# Patient Record
Sex: Female | Born: 1965 | Race: White | Hispanic: No | Smoking: Never smoker
Health system: Southern US, Community
[De-identification: ages and names within clinical notes are randomized; demographics above are authoritative.]

## PROBLEM LIST (undated history)

## (undated) DIAGNOSIS — I10 Essential (primary) hypertension: Secondary | ICD-10-CM

## (undated) DIAGNOSIS — E079 Disorder of thyroid, unspecified: Secondary | ICD-10-CM

## (undated) DIAGNOSIS — C801 Malignant (primary) neoplasm, unspecified: Secondary | ICD-10-CM

## (undated) DIAGNOSIS — J189 Pneumonia, unspecified organism: Secondary | ICD-10-CM

---

## 2018-03-01 DIAGNOSIS — R69 Illness, unspecified: Secondary | ICD-10-CM

## 2018-03-01 DIAGNOSIS — R748 Abnormal levels of other serum enzymes: Secondary | ICD-10-CM | POA: Diagnosis not present

## 2018-03-01 DIAGNOSIS — R591 Generalized enlarged lymph nodes: Secondary | ICD-10-CM | POA: Diagnosis not present

## 2018-03-02 DIAGNOSIS — R591 Generalized enlarged lymph nodes: Secondary | ICD-10-CM | POA: Diagnosis not present

## 2018-03-02 DIAGNOSIS — R748 Abnormal levels of other serum enzymes: Secondary | ICD-10-CM | POA: Diagnosis not present

## 2018-03-02 DIAGNOSIS — R509 Fever, unspecified: Secondary | ICD-10-CM

## 2018-03-02 DIAGNOSIS — R69 Illness, unspecified: Secondary | ICD-10-CM | POA: Diagnosis not present

## 2018-03-03 DIAGNOSIS — R591 Generalized enlarged lymph nodes: Secondary | ICD-10-CM | POA: Diagnosis not present

## 2018-03-03 DIAGNOSIS — J189 Pneumonia, unspecified organism: Secondary | ICD-10-CM | POA: Diagnosis not present

## 2018-03-08 ENCOUNTER — Encounter: Payer: Self-pay | Admitting: Family

## 2018-03-08 DIAGNOSIS — R590 Localized enlarged lymph nodes: Secondary | ICD-10-CM

## 2018-03-08 DIAGNOSIS — E049 Nontoxic goiter, unspecified: Secondary | ICD-10-CM | POA: Diagnosis not present

## 2018-03-08 DIAGNOSIS — Z8041 Family history of malignant neoplasm of ovary: Secondary | ICD-10-CM | POA: Diagnosis not present

## 2018-03-08 DIAGNOSIS — R945 Abnormal results of liver function studies: Secondary | ICD-10-CM | POA: Diagnosis not present

## 2018-04-15 ENCOUNTER — Other Ambulatory Visit (HOSPITAL_COMMUNITY)
Admission: RE | Admit: 2018-04-15 | Discharge: 2018-04-15 | Disposition: A | Discharge status: Home / Self Care | Payer: 59 | Source: Ambulatory Visit | Attending: Oncology | Admitting: Oncology

## 2018-04-15 DIAGNOSIS — R590 Localized enlarged lymph nodes: Secondary | ICD-10-CM | POA: Diagnosis present

## 2018-04-15 DIAGNOSIS — E039 Hypothyroidism, unspecified: Secondary | ICD-10-CM | POA: Insufficient documentation

## 2018-04-25 ENCOUNTER — Encounter: Payer: Self-pay | Admitting: Family

## 2018-04-25 DIAGNOSIS — R6889 Other general symptoms and signs: Secondary | ICD-10-CM

## 2018-04-25 DIAGNOSIS — D649 Anemia, unspecified: Secondary | ICD-10-CM

## 2018-04-25 DIAGNOSIS — E049 Nontoxic goiter, unspecified: Secondary | ICD-10-CM

## 2018-04-25 DIAGNOSIS — R945 Abnormal results of liver function studies: Secondary | ICD-10-CM | POA: Diagnosis not present

## 2018-04-25 DIAGNOSIS — R161 Splenomegaly, not elsewhere classified: Secondary | ICD-10-CM | POA: Diagnosis not present

## 2018-04-25 DIAGNOSIS — R590 Localized enlarged lymph nodes: Secondary | ICD-10-CM | POA: Diagnosis not present

## 2018-05-14 ENCOUNTER — Ambulatory Visit (INDEPENDENT_AMBULATORY_CARE_PROVIDER_SITE_OTHER): Payer: 59 | Admitting: Family

## 2018-05-14 ENCOUNTER — Encounter: Payer: Self-pay | Admitting: Family

## 2018-05-14 VITALS — BP 135/84 | HR 65 | Temp 97.9°F | Ht 68.0 in | Wt 175.0 lb

## 2018-05-14 DIAGNOSIS — R059 Cough, unspecified: Secondary | ICD-10-CM

## 2018-05-14 DIAGNOSIS — R1115 Cyclical vomiting syndrome unrelated to migraine: Secondary | ICD-10-CM

## 2018-05-14 DIAGNOSIS — R05 Cough: Secondary | ICD-10-CM | POA: Diagnosis not present

## 2018-05-14 DIAGNOSIS — G43A0 Cyclical vomiting, not intractable: Secondary | ICD-10-CM

## 2018-05-14 DIAGNOSIS — R591 Generalized enlarged lymph nodes: Secondary | ICD-10-CM

## 2018-05-14 NOTE — Patient Instructions (Signed)
Nice to meet you.  We will check your blood work today.  Please keep a temperature log of your fevers.    We will plan to follow up in 1 month or sooner if needed pending your blood work results.   Sarcoidosis Sarcoidosis is a disease that causes inflammation in your organs and other areas of your body. The lungs are most often affected (pulmonary sarcoidosis). Sarcoidosis can also affect your lymph nodes, liver, eyes, skin, or any other body tissue. When you have sarcoidosis, small clumps of tissue (granulomas) form in the affected area of your body. Granulomas are made up of your body's defense (immune) cells. Inflammation results when your body reacts to a harmful substance. Normally, inflammation goes away after immune cells get rid of the harmful substance. In sarcoidosis, the immune cells form granulomas instead. What are the causes? The exact cause of sarcoidosis is not known. Something triggers the immune system to respond, such as dust, chemicals, bacteria, or a virus. What increases the risk? You may be at a greater risk for sarcoidosis if you:  Have a family history of the disease.  Are African American.  Are of Northern European ancestry.  Are 32-41 years old.  Are female.  What are the signs or symptoms? Many people with sarcoidosis have no symptoms. Others have very mild symptoms. Sarcoidosis most often affects the lungs. Symptoms include:  Chest pain.  Coughing.  Wheezing.  Shortness of breath.  Other common symptoms include:  Night sweats.  Weight loss.  Fatigue.  Depression.  A sense of uneasiness.  How is this diagnosed? Sarcoidosis may be diagnosed by:  Medical history and physical exam.  Chest X-ray. This looks for granulomas in your lungs.  Lung function tests. These measure your breathing and look for problems related to sarcoidosis.  Examining a sample of tissue under a microscope (biopsy).  How is this treated? Sarcoidosis usually  clears up without treatment. You may take medicines to reduce inflammation or relieve symptoms. These may include:  Prednisone. This steroid reduces inflammation related to sarcoidosis.  Chloroquine or hydroxychloroquine. These are antimalarial medicines used to treat sarcoidosis that affects the skin or brain.  Methotrexate, leflunomide, or azathioprine. These medicines affect the immune system and can help with sarcoidosis in the joints, eyes, skin, or lungs.  Inhalers. Inhaled medicines can help you breathe if sarcoidosis is affecting your lungs.  Follow these instructions at home:  Do not use any tobacco products, including cigarettes, chewing tobacco, or electronic cigarettes. If you need help quitting, ask your health care provider.  Avoid secondhand smoke.  Avoid irritating dust and chemicals. Stay indoors on days when air quality is poor in your area.  Take medicines only as directed by your health care provider. Contact a health care provider if:  You have vision problems.  You have shortness of breath.  You have a dry, persistent cough.  You have an irregular heartbeat.  You have pain or ache in your joints, hands, or feet.  You have an unexplained rash. Get help right away if: You have chest pain. This information is not intended to replace advice given to you by your health care provider. Make sure you discuss any questions you have with your health care provider. Document Released: 07/12/2004 Document Revised: 02/17/2016 Document Reviewed: 01/07/2014 Elsevier Interactive Patient Education  [employer].

## 2018-05-14 NOTE — Assessment & Plan Note (Signed)
Lymphadenopathy of unclear source although appears to be improving slowly. She does continue to have elevated inflammatory markers. I do not see any true evidence of bacterial, viral, or fungal infection at present. I do question possibility of atypical infection, although the ways her symptoms present make it unlikely. She has had blood cultures performed which were negative. There is a possibility of aspiration. The other non-infectious differentials would include inflammatory condition such as sarcoidosis. Oncology continues to look at possibility of low grade lymphoma as well. I will check her Quantiferon Gold, CBC w diff, ESR, CRP, and Hepatic function panel. Recommend no antibiotics presently. Have encouraged her to track her temperatures if she has fevers. She does have follow up with oncology planned. I will see her back in about 1 month or sooner if needed pending blood work. Advised to seek further care if her symptoms worsen.

## 2018-05-14 NOTE — Progress Notes (Signed)
Subjective:    Patient ID: Cindy Ward, female    DOB: August 01, 1966, 52 y.o.   MRN: [id number]  Chief Complaint  Patient presents with  . 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 female who presents today for initial office visit for lymphadenopathy, nausea, and fevers.   Cindy Ward was initially evaluated at the [facility] for on-going lymphadenopathy of multiple sites. Symptoms started in in early June with flulike symptoms, cough, and dyspnea with sweats and fever over 103 with chills. She was diagnosed with pneumonia and thyromegaly. CT scan showed multiple left supraclavicular nodes up to 8 mm and prominent left hilar and AP window nodes but also less than 1 cm. She was given antibiotics and discharged on azithromycin. Testing for HIV, influenza, RSV, mononucleosis, and hepatitis were negative. When evaluated in oncology/hematology. There is no evidence of hemolytic anemia with LDH and qualitative immunoglobulins within normal ranges. Ultrasound of the thyroid revealed enlarged thyroid gland but only 1 cm nodule not requiring biopsy. She was once again seen in the emergency room in July and CT scan was performed showing persistent bilateral axillary and left supraclavicular adenopathy and left para pretracheal node up to 11 mm. At her follow-up her symptoms waxed and waned and appeared to have cycles every 2 weeks of cough, sweats, and chills lasting for 3-5 days and then would resolve. Her AST and ALT remained elevated at 60 and 71 respectively. LDH was 495 ALP 133 and a titer was negative; Lyme disease, parvovirus, and Rickesetta. A lymph node biopsy showed no monoclonal B cell or Phentypically aberrent T. Cell   Cindy Ward continues to experience almost cyclical episodes of cough for 1 day followed by cough, nausea, and vomiting with episodes lasting for 3-5 days at a  time and generally about 2 weeks apart. She does have fevers and rigors at times, however does not measure her temperature at home. The last of these episodes occurred about 1 week ago lasting the typical 5 days. She got better with no antibiotics, but was seen in the ED an received fluids at the time. Denies reflux, abdominal pain, diarrhea, or constipation. She has not traveled outside of the country recently and has been healthy to this point. She does not have any symptoms currently.    Continues to have the associated symptoms of nausea, vomiting, fevers, and chills with decreased appetite. Does not measure her fevers at home. Symptoms always start with a cough for a day and then come back. The last time she had symptoms was about 1 week ago and started to get better without medication. The biggest complication is nasusea and decreased ability to eat. She is able to eat outside of the   Not on File    Outpatient Medications Prior to Visit  Medication Sig Dispense Refill  . levothyroxine (SYNTHROID, LEVOTHROID) 75 MCG tablet Take 1 tablet by mouth daily.    . nebivolol (BYSTOLIC) 10 MG tablet Take 1 tablet by mouth daily.    . ondansetron (ZOFRAN-ODT) 8 MG disintegrating tablet Take 1 tablet by mouth as needed for nausea/vomiting. PRN TID  0  . PROAIR HFA 108 (90 Base) MCG/ACT inhaler Take 2 puffs by mouth 4 (four) times daily as needed.  0  . traZODone (DESYREL) 50 MG tablet Take 50 mg by mouth at bedtime.  3   No facility-administered medications prior  to visit.      History reviewed. No pertinent past medical history.    History reviewed. No pertinent surgical history.    History reviewed. No pertinent family history.    Social History   Socioeconomic History  . Marital status: Divorced    Spouse name: Not on file  . Number of children: Not on file  . Years of education: Not on file  . Highest education level: Not on file  Occupational History  . Not on file  Social  Needs  . Financial resource strain: Not on file  . Food insecurity:    Worry: Not on file    Inability: Not on file  . Transportation needs:    Medical: Not on file    Non-medical: Not on file  Tobacco Use  . Smoking status: Never Smoker  . Smokeless tobacco: Never Used  Substance and Sexual Activity  . Alcohol use: Never    Frequency: Never  . Drug use: Never  . Sexual activity: Not on file  Lifestyle  . Physical activity:    Days per week: Not on file    Minutes per session: Not on file  . Stress: Not on file  Relationships  . Social connections:    Talks on phone: Not on file    Gets together: Not on file    Attends religious service: Not on file    Active member of club or organization: Not on file    Attends meetings of clubs or organizations: Not on file    Relationship status: Not on file  . Intimate partner violence:    Fear of current or ex partner: Not on file    Emotionally abused: Not on file    Physically abused: Not on file    Forced sexual activity: Not on file  Other Topics Concern  . Not on file  Social History Narrative  . Not on file      Review of Systems  Constitutional: Positive for chills and fever.  Respiratory: Positive for cough. Negative for chest tightness.   Cardiovascular: Negative for chest pain.  Gastrointestinal: Positive for nausea and vomiting. Negative for abdominal distention, blood in stool, constipation and diarrhea.       Objective:    BP 135/84   Pulse 65   Temp 97.9 F (36.6 C)   Ht '5\' 8"'  (1.727 m)   Wt 175 lb (79.4 kg)   LMP  (LMP Unknown)   BMI 26.61 kg/m  Nursing note and vital signs reviewed.  Physical Exam  Constitutional: She is oriented to person, place, and time. She appears well-developed and well-nourished. She is cooperative. No distress.  Cardiovascular: Normal rate, regular rhythm, normal heart sounds and intact distal pulses. Exam reveals no gallop.  No murmur heard. Pulmonary/Chest: Effort  normal and breath sounds normal. No stridor. No respiratory distress. She has no wheezes. She has no rales. She exhibits no tenderness.  Abdominal: Soft. Bowel sounds are normal. She exhibits no distension and no mass. There is no tenderness. There is no rebound and no guarding. No hernia.  Lymphadenopathy:    She has cervical adenopathy.       Right cervical: Posterior cervical adenopathy present.       Right: Supraclavicular adenopathy present.       Left: Supraclavicular adenopathy present.  Neurological: She is alert and oriented to person, place, and time.  Skin: Skin is warm and dry.  Psychiatric: She has a normal mood and affect. Her behavior  is normal. Judgment and thought content normal.        Assessment & Plan:   Problem List Items Addressed This Visit      Immune and Lymphatic   Lymphadenopathy - Primary    Lymphadenopathy of unclear source although appears to be improving slowly. She does continue to have elevated inflammatory markers. I do not see any true evidence of bacterial, viral, or fungal infection at present. I do question possibility of atypical infection, although the ways her symptoms present make it unlikely. She has had blood cultures performed which were negative. There is a possibility of aspiration. The other non-infectious differentials would include inflammatory condition such as sarcoidosis. Oncology continues to look at possibility of low grade lymphoma as well. I will check her Quantiferon Gold, CBC w diff, ESR, CRP, and Hepatic function panel. Recommend no antibiotics presently. Have encouraged her to track her temperatures if she has fevers. She does have follow up with oncology planned. I will see her back in about 1 month or sooner if needed pending blood work. Advised to seek further care if her symptoms worsen.        Other Visit Diagnoses    Non-intractable cyclical vomiting with nausea       Relevant Orders   QuantiFERON-TB Gold Plus   CBC w/Diff    Sedimentation rate   C-reactive protein   Hepatic function panel   Cough       Relevant Orders   QuantiFERON-TB Gold Plus   CBC w/Diff   Sedimentation rate   C-reactive protein   Hepatic function panel       I am having Cindy Ward maintain her traZODone, ondansetron, nebivolol, levothyroxine, and PROAIR HFA.   Follow-up: Return in about 1 month (around 06/14/2018), or if symptoms worsen or fail to improve.    Terri Piedra, MSN, FNP-C Nurse Practitioner [facility] for Infectious Disease [place] [facility] Office phone: [phone] Pager: [place] number: [phone]

## 2018-05-17 LAB — QUANTIFERON-TB GOLD PLUS
NIL: 0.03 [IU]/mL
QuantiFERON-TB Gold Plus: NEGATIVE
TB1-NIL: 0 IU/mL
TB2-NIL: 0 [IU]/mL

## 2018-05-17 LAB — CBC WITH DIFFERENTIAL/PLATELET
BASOS PCT: 0.9 %
Basophils Absolute: 52 cells/uL (ref 0–200)
EOS PCT: 2.9 %
Eosinophils Absolute: 168 cells/uL (ref 15–500)
HCT: 31.6 % — ABNORMAL LOW (ref 35.0–45.0)
Hemoglobin: 10.3 g/dL — ABNORMAL LOW (ref 11.7–15.5)
Lymphs Abs: 1839 cells/uL (ref 850–3900)
MCH: 28 pg (ref 27.0–33.0)
MCHC: 32.6 g/dL (ref 32.0–36.0)
MCV: 85.9 fL (ref 80.0–100.0)
MONOS PCT: 6.9 %
MPV: 11.2 fL (ref 7.5–12.5)
NEUTROS PCT: 57.6 %
Neutro Abs: 3341 cells/uL (ref 1500–7800)
PLATELETS: 294 10*3/uL (ref 140–400)
RBC: 3.68 10*6/uL — AB (ref 3.80–5.10)
RDW: 15.7 % — AB (ref 11.0–15.0)
TOTAL LYMPHOCYTE: 31.7 %
WBC mixed population: 400 cells/uL (ref 200–950)
WBC: 5.8 10*3/uL (ref 3.8–10.8)

## 2018-05-17 LAB — SEDIMENTATION RATE: SED RATE: 56 mm/h — AB (ref 0–30)

## 2018-05-17 LAB — C-REACTIVE PROTEIN: CRP: 48 mg/L — ABNORMAL HIGH (ref <8.0)

## 2018-05-17 LAB — HEPATIC FUNCTION PANEL
AG RATIO: 1.2 (calc) (ref 1.0–2.5)
ALT: 27 U/L (ref 6–29)
AST: 26 U/L (ref 10–35)
Albumin: 3.8 g/dL (ref 3.6–5.1)
Alkaline phosphatase (APISO): 93 U/L (ref 33–130)
Bilirubin, Direct: 0.1 mg/dL (ref 0.0–0.2)
Globulin: 3.2 g/dL (calc) (ref 1.9–3.7)
Indirect Bilirubin: 0.2 mg/dL (calc) (ref 0.2–1.2)
TOTAL PROTEIN: 7 g/dL (ref 6.1–8.1)
Total Bilirubin: 0.3 mg/dL (ref 0.2–1.2)

## 2018-05-24 DIAGNOSIS — Z8041 Family history of malignant neoplasm of ovary: Secondary | ICD-10-CM

## 2018-05-24 DIAGNOSIS — R590 Localized enlarged lymph nodes: Secondary | ICD-10-CM | POA: Diagnosis not present

## 2018-05-24 DIAGNOSIS — D649 Anemia, unspecified: Secondary | ICD-10-CM

## 2018-05-24 DIAGNOSIS — Z800 Family history of malignant neoplasm of digestive organs: Secondary | ICD-10-CM

## 2018-05-24 DIAGNOSIS — R6889 Other general symptoms and signs: Secondary | ICD-10-CM

## 2018-05-24 DIAGNOSIS — R161 Splenomegaly, not elsewhere classified: Secondary | ICD-10-CM

## 2018-06-24 DIAGNOSIS — Z8041 Family history of malignant neoplasm of ovary: Secondary | ICD-10-CM

## 2018-06-24 DIAGNOSIS — D649 Anemia, unspecified: Secondary | ICD-10-CM

## 2018-06-24 DIAGNOSIS — R05 Cough: Secondary | ICD-10-CM

## 2018-06-24 DIAGNOSIS — R509 Fever, unspecified: Secondary | ICD-10-CM

## 2018-06-24 DIAGNOSIS — R591 Generalized enlarged lymph nodes: Secondary | ICD-10-CM | POA: Diagnosis not present

## 2018-06-24 DIAGNOSIS — R161 Splenomegaly, not elsewhere classified: Secondary | ICD-10-CM | POA: Diagnosis not present

## 2018-06-24 DIAGNOSIS — Z800 Family history of malignant neoplasm of digestive organs: Secondary | ICD-10-CM

## 2018-06-24 DIAGNOSIS — B0223 Postherpetic polyneuropathy: Secondary | ICD-10-CM

## 2018-06-27 ENCOUNTER — Encounter: Payer: Self-pay | Admitting: Family

## 2018-06-27 ENCOUNTER — Ambulatory Visit (INDEPENDENT_AMBULATORY_CARE_PROVIDER_SITE_OTHER): Payer: 59 | Admitting: Family

## 2018-06-27 VITALS — BP 113/74 | HR 80 | Temp 98.4°F | Wt 172.8 lb

## 2018-06-27 DIAGNOSIS — R1115 Cyclical vomiting syndrome unrelated to migraine: Secondary | ICD-10-CM

## 2018-06-27 DIAGNOSIS — R591 Generalized enlarged lymph nodes: Secondary | ICD-10-CM | POA: Diagnosis not present

## 2018-06-27 NOTE — Progress Notes (Signed)
Subjective:    Patient ID: Cindy Ward, female    DOB: [birth year], 52 y.o.   MRN: [id number]  Chief Complaint  Patient presents with  . Cyclical Vomiting and Nasuea     HPI:  Cindy Ward is a 52 y.o. female who presents today for a follow up office visit.  Cindy Ward was last seen in the office on 0/10/93 with the cyclical symptoms starting with a cough for 1 day and progressing to nausea, vomiting and fevers that lasts for about 3-5 days then spontaneously starts improving with symptoms occurring about every 2 weeks. Testing to this point has been negative. She has been seen by Oncology and diagnosed with low grade lymphoma and sarcoidosis. Most rcent blood work completed with elevated inflammatory markers and negative Quantiferon. She was seen in the ED on 06/14/18 for vomiting.   Towards the end of September Cindy Ward completed a course of antibiotics which did not help with her symptoms. She does continue to have a cough, but has remained asymptomatic for the past 13 days. 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 She has follow up with [name] about the possibility of performing a bone marrow biposy. Other than cough, she does not have any other symptoms.   No Known Allergies    Outpatient Medications Prior to Visit  Medication Sig Dispense Refill  . levothyroxine (SYNTHROID, LEVOTHROID) 75 MCG tablet Take 1 tablet by mouth daily.    . nebivolol (BYSTOLIC) 10 MG tablet Take 1 tablet by mouth daily.    . ondansetron (ZOFRAN-ODT) 8 MG disintegrating tablet Take 1 tablet by mouth as needed for nausea/vomiting. PRN TID  0  . PROAIR HFA 108 (90 Base) MCG/ACT inhaler Take 2 puffs by mouth 4 (four) times daily as needed.  0  . traZODone (DESYREL) 50 MG tablet Take 50 mg by mouth at bedtime.  3   No facility-administered medications prior to visit.      History reviewed. No pertinent past medical  history.   History reviewed. No pertinent surgical history.     Review of Systems  Constitutional: Negative for chills, fatigue and fever.  Respiratory: Positive for cough. Negative for chest tightness, shortness of breath and wheezing.   Cardiovascular: Negative for chest pain and leg swelling.  Gastrointestinal: Negative for abdominal pain, blood in stool, constipation, diarrhea, nausea and rectal pain.      Objective:    BP 113/74   Pulse 80   Temp 98.4 F (36.9 C)   Wt 172 lb 12.8 oz (78.4 kg)   LMP  (LMP Unknown)   BMI 26.27 kg/m  Nursing note and vital signs reviewed.  Physical Exam  Constitutional: She is oriented to person, place, and time. She appears well-developed and well-nourished. No distress.  Cardiovascular: Normal rate, regular rhythm, normal heart sounds and intact distal pulses.  Pulmonary/Chest: Effort normal and breath sounds normal. No stridor. No respiratory distress. She has no wheezes. She has no rales. She exhibits no tenderness.  Neurological: She is alert and oriented to person, place, and time.  Skin: Skin is warm and dry.  Psychiatric: She has a normal mood and affect.       Assessment & Plan:   Problem List Items Addressed This Visit      Immune and Lymphatic   Lymphadenopathy - Primary    [name] has been asymptomatic for the past 13 days which is the  longest time that she has not had any symptoms. There does appear to be inflammation present as evidenced through the inflammatory markers, although this is non-specific. There does not appear to be anything infectious at this time that would require antimicrobial therapy. She continues to work with oncology with the possibility of a bone marrow biopsy to rule out lymphoma. Sarcoid could also be playing a role in this. I am also concerned about the possibility of environmental exposure as a origin of her symptoms. I have provided her surgical masks to wear at work to see if that makes any  difference. 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 We will continue to follow.        Other Visit Diagnoses    Non-intractable cyclical vomiting with nausea           I am having Idelia Salm maintain her traZODone, ondansetron, nebivolol, levothyroxine, and PROAIR HFA.    Follow-up: Pending further oncology work up.   Terri Piedra, MSN, FNP-C Nurse Practitioner [facility] for Infectious Disease [facility] Office phone: [phone] Pager: [place] number: [phone]

## 2018-06-27 NOTE — Assessment & Plan Note (Signed)
Cindy Ward has been asymptomatic for the past 13 days which is the longest time that she has not had any symptoms. There does appear to be inflammation present as evidenced through the inflammatory markers, although this is non-specific. There does not appear to be anything infectious at this time that would require antimicrobial therapy. She continues to work with oncology with the possibility of a bone marrow biopsy to rule out lymphoma. Sarcoid could also be playing a role in this. I am also concerned about the possibility of environmental exposure as a origin of her symptoms. I have provided her surgical masks to wear at work to see if that makes any difference. 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 We will continue to follow.

## 2018-06-27 NOTE — Patient Instructions (Signed)
Nice to see you.  No additional blood work at this time.   Recommend attempting a mask while at work to see if this exposure may be the cause of your symptoms.   I do not think that this is an infectious process based on the presentation of your symptoms and that all of your testing has been with no significant findings to date.   I will reach out to [name] to discuss.  Follow up with ID as needed for now.

## 2018-07-02 ENCOUNTER — Other Ambulatory Visit (HOSPITAL_COMMUNITY)
Admission: RE | Admit: 2018-07-02 | Discharge: 2018-07-02 | Disposition: A | Discharge status: Home / Self Care | Payer: 59 | Source: Ambulatory Visit | Attending: Oncology | Admitting: Oncology

## 2018-07-02 DIAGNOSIS — R161 Splenomegaly, not elsewhere classified: Secondary | ICD-10-CM

## 2018-07-02 DIAGNOSIS — D649 Anemia, unspecified: Secondary | ICD-10-CM | POA: Insufficient documentation

## 2018-07-02 DIAGNOSIS — R509 Fever, unspecified: Secondary | ICD-10-CM | POA: Diagnosis not present

## 2018-07-08 DIAGNOSIS — D649 Anemia, unspecified: Secondary | ICD-10-CM | POA: Diagnosis not present

## 2018-07-08 DIAGNOSIS — R161 Splenomegaly, not elsewhere classified: Secondary | ICD-10-CM

## 2018-07-08 DIAGNOSIS — R05 Cough: Secondary | ICD-10-CM

## 2018-07-08 DIAGNOSIS — R509 Fever, unspecified: Secondary | ICD-10-CM

## 2018-07-08 DIAGNOSIS — R945 Abnormal results of liver function studies: Secondary | ICD-10-CM

## 2018-07-08 DIAGNOSIS — R590 Localized enlarged lymph nodes: Secondary | ICD-10-CM

## 2018-07-11 ENCOUNTER — Encounter (HOSPITAL_COMMUNITY): Payer: Self-pay | Admitting: Oncology

## 2018-08-26 DIAGNOSIS — R630 Anorexia: Secondary | ICD-10-CM

## 2018-08-26 DIAGNOSIS — R509 Fever, unspecified: Secondary | ICD-10-CM

## 2018-08-26 DIAGNOSIS — R1111 Vomiting without nausea: Secondary | ICD-10-CM

## 2018-08-26 DIAGNOSIS — R590 Localized enlarged lymph nodes: Secondary | ICD-10-CM

## 2018-08-26 DIAGNOSIS — D649 Anemia, unspecified: Secondary | ICD-10-CM

## 2018-08-26 DIAGNOSIS — R05 Cough: Secondary | ICD-10-CM

## 2018-10-24 DIAGNOSIS — E876 Hypokalemia: Secondary | ICD-10-CM

## 2018-10-24 DIAGNOSIS — E860 Dehydration: Secondary | ICD-10-CM | POA: Diagnosis not present

## 2018-10-24 DIAGNOSIS — R590 Localized enlarged lymph nodes: Secondary | ICD-10-CM

## 2018-10-24 DIAGNOSIS — R5081 Fever presenting with conditions classified elsewhere: Secondary | ICD-10-CM | POA: Diagnosis not present

## 2018-10-30 DIAGNOSIS — E876 Hypokalemia: Secondary | ICD-10-CM

## 2018-10-30 DIAGNOSIS — E860 Dehydration: Secondary | ICD-10-CM

## 2018-10-30 DIAGNOSIS — R590 Localized enlarged lymph nodes: Secondary | ICD-10-CM

## 2018-10-30 DIAGNOSIS — R5081 Fever presenting with conditions classified elsewhere: Secondary | ICD-10-CM

## 2018-11-11 ENCOUNTER — Encounter (HOSPITAL_COMMUNITY): Payer: Self-pay | Admitting: Nurse Practitioner

## 2018-11-11 ENCOUNTER — Inpatient Hospital Stay (HOSPITAL_COMMUNITY): Payer: Medicaid Other

## 2018-11-11 ENCOUNTER — Inpatient Hospital Stay (HOSPITAL_COMMUNITY)
Admission: AD | Admit: 2018-11-11 | Discharge: 2018-12-12 | DRG: 823 | Disposition: A | Discharge status: Home Health | Payer: Medicaid Other | Source: Other Acute Inpatient Hospital | Attending: Internal Medicine | Admitting: Internal Medicine

## 2018-11-11 DIAGNOSIS — E43 Unspecified severe protein-calorie malnutrition: Secondary | ICD-10-CM | POA: Diagnosis present

## 2018-11-11 DIAGNOSIS — I313 Pericardial effusion (noninflammatory): Secondary | ICD-10-CM | POA: Diagnosis present

## 2018-11-11 DIAGNOSIS — C8194 Hodgkin lymphoma, unspecified, lymph nodes of axilla and upper limb: Principal | ICD-10-CM | POA: Diagnosis present

## 2018-11-11 DIAGNOSIS — N644 Mastodynia: Secondary | ICD-10-CM

## 2018-11-11 DIAGNOSIS — Z79890 Hormone replacement therapy (postmenopausal): Secondary | ICD-10-CM

## 2018-11-11 DIAGNOSIS — R591 Generalized enlarged lymph nodes: Secondary | ICD-10-CM | POA: Diagnosis present

## 2018-11-11 DIAGNOSIS — E049 Nontoxic goiter, unspecified: Secondary | ICD-10-CM | POA: Diagnosis present

## 2018-11-11 DIAGNOSIS — Z681 Body mass index (BMI) 19 or less, adult: Secondary | ICD-10-CM | POA: Diagnosis not present

## 2018-11-11 DIAGNOSIS — D61818 Other pancytopenia: Secondary | ICD-10-CM | POA: Diagnosis present

## 2018-11-11 DIAGNOSIS — A0472 Enterocolitis due to Clostridium difficile, not specified as recurrent: Secondary | ICD-10-CM | POA: Diagnosis present

## 2018-11-11 DIAGNOSIS — Z515 Encounter for palliative care: Secondary | ICD-10-CM

## 2018-11-11 DIAGNOSIS — R6520 Severe sepsis without septic shock: Secondary | ICD-10-CM | POA: Diagnosis not present

## 2018-11-11 DIAGNOSIS — E063 Autoimmune thyroiditis: Secondary | ICD-10-CM | POA: Diagnosis present

## 2018-11-11 DIAGNOSIS — B955 Unspecified streptococcus as the cause of diseases classified elsewhere: Secondary | ICD-10-CM | POA: Diagnosis not present

## 2018-11-11 DIAGNOSIS — G9341 Metabolic encephalopathy: Secondary | ICD-10-CM | POA: Diagnosis not present

## 2018-11-11 DIAGNOSIS — J9600 Acute respiratory failure, unspecified whether with hypoxia or hypercapnia: Secondary | ICD-10-CM | POA: Diagnosis not present

## 2018-11-11 DIAGNOSIS — C8190 Hodgkin lymphoma, unspecified, unspecified site: Secondary | ICD-10-CM | POA: Diagnosis present

## 2018-11-11 DIAGNOSIS — B9620 Unspecified Escherichia coli [E. coli] as the cause of diseases classified elsewhere: Secondary | ICD-10-CM | POA: Diagnosis not present

## 2018-11-11 DIAGNOSIS — C8128 Mixed cellularity classical Hodgkin lymphoma, lymph nodes of multiple sites: Secondary | ICD-10-CM

## 2018-11-11 DIAGNOSIS — T451X5A Adverse effect of antineoplastic and immunosuppressive drugs, initial encounter: Secondary | ICD-10-CM | POA: Diagnosis not present

## 2018-11-11 DIAGNOSIS — D638 Anemia in other chronic diseases classified elsewhere: Secondary | ICD-10-CM | POA: Diagnosis not present

## 2018-11-11 DIAGNOSIS — R5081 Fever presenting with conditions classified elsewhere: Secondary | ICD-10-CM | POA: Diagnosis present

## 2018-11-11 DIAGNOSIS — Z7189 Other specified counseling: Secondary | ICD-10-CM

## 2018-11-11 DIAGNOSIS — D6959 Other secondary thrombocytopenia: Secondary | ICD-10-CM | POA: Diagnosis not present

## 2018-11-11 DIAGNOSIS — E440 Moderate protein-calorie malnutrition: Secondary | ICD-10-CM | POA: Diagnosis present

## 2018-11-11 DIAGNOSIS — I700 Atherosclerosis of aorta: Secondary | ICD-10-CM | POA: Diagnosis present

## 2018-11-11 DIAGNOSIS — R17 Unspecified jaundice: Secondary | ICD-10-CM

## 2018-11-11 DIAGNOSIS — B370 Candidal stomatitis: Secondary | ICD-10-CM | POA: Diagnosis present

## 2018-11-11 DIAGNOSIS — K1230 Oral mucositis (ulcerative), unspecified: Secondary | ICD-10-CM | POA: Diagnosis not present

## 2018-11-11 DIAGNOSIS — E872 Acidosis: Secondary | ICD-10-CM | POA: Diagnosis not present

## 2018-11-11 DIAGNOSIS — A419 Sepsis, unspecified organism: Secondary | ICD-10-CM | POA: Diagnosis not present

## 2018-11-11 DIAGNOSIS — I10 Essential (primary) hypertension: Secondary | ICD-10-CM | POA: Diagnosis present

## 2018-11-11 DIAGNOSIS — R0600 Dyspnea, unspecified: Secondary | ICD-10-CM

## 2018-11-11 DIAGNOSIS — E876 Hypokalemia: Secondary | ICD-10-CM | POA: Diagnosis not present

## 2018-11-11 DIAGNOSIS — D689 Coagulation defect, unspecified: Secondary | ICD-10-CM | POA: Diagnosis not present

## 2018-11-11 DIAGNOSIS — R748 Abnormal levels of other serum enzymes: Secondary | ICD-10-CM | POA: Diagnosis present

## 2018-11-11 DIAGNOSIS — Z79899 Other long term (current) drug therapy: Secondary | ICD-10-CM

## 2018-11-11 DIAGNOSIS — D696 Thrombocytopenia, unspecified: Secondary | ICD-10-CM | POA: Diagnosis present

## 2018-11-11 DIAGNOSIS — R161 Splenomegaly, not elsewhere classified: Secondary | ICD-10-CM | POA: Diagnosis present

## 2018-11-11 DIAGNOSIS — E860 Dehydration: Secondary | ICD-10-CM | POA: Diagnosis present

## 2018-11-11 DIAGNOSIS — D709 Neutropenia, unspecified: Secondary | ICD-10-CM | POA: Diagnosis present

## 2018-11-11 DIAGNOSIS — D630 Anemia in neoplastic disease: Secondary | ICD-10-CM | POA: Diagnosis not present

## 2018-11-11 DIAGNOSIS — N939 Abnormal uterine and vaginal bleeding, unspecified: Secondary | ICD-10-CM

## 2018-11-11 DIAGNOSIS — R7881 Bacteremia: Secondary | ICD-10-CM | POA: Diagnosis present

## 2018-11-11 DIAGNOSIS — N136 Pyonephrosis: Secondary | ICD-10-CM | POA: Diagnosis not present

## 2018-11-11 DIAGNOSIS — R634 Abnormal weight loss: Secondary | ICD-10-CM | POA: Diagnosis present

## 2018-11-11 DIAGNOSIS — R21 Rash and other nonspecific skin eruption: Secondary | ICD-10-CM | POA: Diagnosis present

## 2018-11-11 DIAGNOSIS — R441 Visual hallucinations: Secondary | ICD-10-CM | POA: Diagnosis not present

## 2018-11-11 DIAGNOSIS — I119 Hypertensive heart disease without heart failure: Secondary | ICD-10-CM | POA: Diagnosis present

## 2018-11-11 DIAGNOSIS — D649 Anemia, unspecified: Secondary | ICD-10-CM | POA: Insufficient documentation

## 2018-11-11 DIAGNOSIS — R0682 Tachypnea, not elsewhere classified: Secondary | ICD-10-CM

## 2018-11-11 DIAGNOSIS — R509 Fever, unspecified: Secondary | ICD-10-CM | POA: Diagnosis present

## 2018-11-11 HISTORY — DX: Essential (primary) hypertension: I10

## 2018-11-11 HISTORY — DX: Disorder of thyroid, unspecified: E07.9

## 2018-11-11 LAB — COMPREHENSIVE METABOLIC PANEL
ALT: 62 U/L — ABNORMAL HIGH (ref 0–44)
AST: 190 U/L — ABNORMAL HIGH (ref 15–41)
Albumin: 2.3 g/dL — ABNORMAL LOW (ref 3.5–5.0)
Alkaline Phosphatase: 96 U/L (ref 38–126)
Anion gap: 9 (ref 5–15)
BUN: 17 mg/dL (ref 6–20)
CHLORIDE: 101 mmol/L (ref 98–111)
CO2: 25 mmol/L (ref 22–32)
Calcium: 7.5 mg/dL — ABNORMAL LOW (ref 8.9–10.3)
Creatinine, Ser: 0.9 mg/dL (ref 0.44–1.00)
GFR calc Af Amer: >60 mL/min (ref >60)
GFR calc non Af Amer: >60 mL/min (ref >60)
Glucose, Bld: 113 mg/dL — ABNORMAL HIGH (ref 70–99)
Potassium: 3.3 mmol/L — ABNORMAL LOW (ref 3.5–5.1)
Sodium: 135 mmol/L (ref 135–145)
Total Bilirubin: 1.9 mg/dL — ABNORMAL HIGH (ref 0.3–1.2)
Total Protein: 6.1 g/dL — ABNORMAL LOW (ref 6.5–8.1)

## 2018-11-11 LAB — DIFFERENTIAL
Basophils Absolute: 0 10*3/uL (ref 0.0–0.1)
Basophils Relative: 0 %
Eosinophils Absolute: 0 10*3/uL (ref 0.0–0.5)
Eosinophils Relative: 0 %
LYMPHS ABS: 0.4 10*3/uL — AB (ref 0.7–4.0)
Lymphocytes Relative: 10 %
Monocytes Absolute: 0.7 10*3/uL (ref 0.1–1.0)
Monocytes Relative: 16 %
Neutro Abs: 3.2 10*3/uL (ref 1.7–7.7)
Neutrophils Relative %: 72 %

## 2018-11-11 LAB — RETICULOCYTES
IMMATURE RETIC FRACT: 10.5 % (ref 2.3–15.9)
RBC.: 2.46 MIL/uL — ABNORMAL LOW (ref 3.87–5.11)
Retic Count, Absolute: 29.5 10*3/uL (ref 19.0–186.0)
Retic Ct Pct: 1.2 % (ref 0.4–3.1)

## 2018-11-11 LAB — CBC
HCT: 20.3 % — ABNORMAL LOW (ref 36.0–46.0)
Hemoglobin: 6.5 g/dL — CL (ref 12.0–15.0)
MCH: 26.4 pg (ref 26.0–34.0)
MCHC: 32 g/dL (ref 30.0–36.0)
MCV: 82.5 fL (ref 80.0–100.0)
Platelets: 70 10*3/uL — ABNORMAL LOW (ref 150–400)
RBC: 2.46 MIL/uL — ABNORMAL LOW (ref 3.87–5.11)
RDW: 18 % — ABNORMAL HIGH (ref 11.5–15.5)
WBC: 4.4 10*3/uL (ref 4.0–10.5)
nRBC: 0 % (ref 0.0–0.2)

## 2018-11-11 LAB — PREPARE RBC (CROSSMATCH)

## 2018-11-11 LAB — PROTIME-INR
INR: 1.84
Prothrombin Time: 21 seconds — ABNORMAL HIGH (ref 11.4–15.2)

## 2018-11-11 LAB — GLUCOSE, CAPILLARY: GLUCOSE-CAPILLARY: 110 mg/dL — AB (ref 70–99)

## 2018-11-11 LAB — APTT: aPTT: 43 seconds — ABNORMAL HIGH (ref 24–36)

## 2018-11-11 LAB — LACTATE DEHYDROGENASE: LDH: 176 U/L (ref 98–192)

## 2018-11-11 LAB — DIRECT ANTIGLOBULIN TEST (NOT AT ARMC)
DAT, IGG: NEGATIVE
DAT, complement: NEGATIVE

## 2018-11-11 LAB — MRSA PCR SCREENING: MRSA by PCR: NEGATIVE

## 2018-11-11 LAB — SAVE SMEAR(SSMR), FOR PROVIDER SLIDE REVIEW

## 2018-11-11 LAB — ABO/RH: ABO/RH(D): AB NEG

## 2018-11-11 MED ORDER — NEBIVOLOL HCL 10 MG PO TABS
10.0000 mg | ORAL_TABLET | Freq: Every day | ORAL | Status: DC
  Administered 2018-11-11 – 2018-11-15 (×4): 10 mg via ORAL
  Filled 2018-11-11 (×5): qty 1

## 2018-11-11 MED ORDER — SODIUM CHLORIDE 0.9 % IV BOLUS
500.0000 mL | Freq: Once | INTRAVENOUS | Status: AC
  Administered 2018-11-11: 500 mL via INTRAVENOUS

## 2018-11-11 MED ORDER — POTASSIUM CHLORIDE 10 MEQ/100ML IV SOLN
10.0000 meq | INTRAVENOUS | Status: AC
  Administered 2018-11-11 – 2018-11-12 (×4): 10 meq via INTRAVENOUS
  Filled 2018-11-11 (×4): qty 100

## 2018-11-11 MED ORDER — SODIUM CHLORIDE 0.9% IV SOLUTION: Freq: Once | INTRAVENOUS | Status: AC

## 2018-11-11 MED ORDER — OXYCODONE HCL 5 MG PO TABS: 5.0000 mg | ORAL_TABLET | ORAL | Status: DC | PRN

## 2018-11-11 MED ORDER — ONDANSETRON HCL 4 MG PO TABS
4.0000 mg | ORAL_TABLET | Freq: Four times a day (QID) | ORAL | Status: DC | PRN
Start: 2018-11-11 — End: 2018-12-12

## 2018-11-11 MED ORDER — ORAL CARE MOUTH RINSE
15.0000 mL | Freq: Two times a day (BID) | OROMUCOSAL | Status: DC
  Administered 2018-11-12 – 2018-12-12 (×45): 15 mL via OROMUCOSAL

## 2018-11-11 MED ORDER — SODIUM CHLORIDE 0.9 % IV SOLN
2.0000 g | INTRAVENOUS | Status: DC
  Administered 2018-11-12: 2 g via INTRAVENOUS
  Filled 2018-11-11: qty 2

## 2018-11-11 MED ORDER — BOOST / RESOURCE BREEZE PO LIQD CUSTOM
1.0000 | Freq: Three times a day (TID) | ORAL | Status: DC
  Administered 2018-11-12 – 2018-11-15 (×4): 1 via ORAL
  Administered 2018-11-16: 237 mL via ORAL
  Administered 2018-11-17: 1 via ORAL
  Administered 2018-11-17: 237 mL via ORAL
  Administered 2018-11-17 – 2018-11-23 (×7): 1 via ORAL

## 2018-11-11 MED ORDER — ACETAMINOPHEN 325 MG PO TABS
650.0000 mg | ORAL_TABLET | Freq: Once | ORAL | Status: AC
  Administered 2018-11-11: 650 mg via ORAL

## 2018-11-11 MED ORDER — ACETAMINOPHEN 325 MG PO TABS
650.0000 mg | ORAL_TABLET | Freq: Four times a day (QID) | ORAL | Status: DC | PRN
  Administered 2018-11-11 – 2018-11-25 (×27): 650 mg via ORAL
  Filled 2018-11-11 (×31): qty 2

## 2018-11-11 MED ORDER — SODIUM CHLORIDE 0.9 % IV BOLUS: 1000.0000 mL | Freq: Once | INTRAVENOUS | Status: DC

## 2018-11-11 MED ORDER — SODIUM CHLORIDE 0.9% IV SOLUTION: Freq: Once | INTRAVENOUS | Status: DC

## 2018-11-11 MED ORDER — LACTATED RINGERS IV SOLN
INTRAVENOUS | Status: AC
  Administered 2018-11-11 – 2018-11-12 (×3): via INTRAVENOUS

## 2018-11-11 MED ORDER — POLYETHYLENE GLYCOL 3350 17 G PO PACK
17.0000 g | PACK | Freq: Every day | ORAL | Status: DC | PRN
Start: 2018-11-11 — End: 2018-11-25

## 2018-11-11 MED ORDER — POTASSIUM CHLORIDE CRYS ER 20 MEQ PO TBCR: 40.0000 meq | EXTENDED_RELEASE_TABLET | Freq: Once | ORAL | Status: DC

## 2018-11-11 MED ORDER — ACETAMINOPHEN 650 MG RE SUPP
650.0000 mg | Freq: Four times a day (QID) | RECTAL | Status: DC | PRN
  Administered 2018-11-18 – 2018-12-06 (×5): 650 mg via RECTAL
  Filled 2018-11-11 (×6): qty 1

## 2018-11-11 MED ORDER — ONDANSETRON HCL 4 MG/2ML IJ SOLN
4.0000 mg | Freq: Four times a day (QID) | INTRAMUSCULAR | Status: DC | PRN
  Administered 2018-11-11 – 2018-11-28 (×7): 4 mg via INTRAVENOUS
  Filled 2018-11-11 (×7): qty 2

## 2018-11-11 MED ORDER — LEVOTHYROXINE SODIUM 75 MCG PO TABS
75.0000 ug | ORAL_TABLET | Freq: Every day | ORAL | Status: DC
  Administered 2018-11-12 – 2018-11-25 (×13): 75 ug via ORAL
  Filled 2018-11-11 (×13): qty 1

## 2018-11-11 NOTE — Progress Notes (Signed)
Antony Blackbird, RN discussed patient condition with MD Florene Glen.   MD discussed blood risks and benefits with pt over the phone and obtained verbal consent for blood administration, consent witnessed by Ronalee Belts, RN and Karna Christmas, RN.  Orders received and consent signed.

## 2018-11-11 NOTE — Progress Notes (Signed)
Pt. Came to the unit with blood infusing through Lt P[facility]. Blood administraation completed at 1800. VS Post bld. Admin.  [id number](57)  Temp 102.32F

## 2018-11-11 NOTE — H&P (Signed)
History and Physical    [name]:[id number] DOB: 12/03/1965 DOA: 11/11/2018  PCP: [name], MD  Patient coming from: home  I have personally briefly reviewed patient's old medical records in [place]  Chief Complaint: fevers, night sweats, nausea, vomiting  HPI: Cindy Ward is a 53 y.o. female with medical history significant of hypertension hypothyroidism essentially outside hospital with recurrent nausea, vomiting, fevers, and night sweats.  Patient notes a 70-monthhistory of cough, fever, chills, night sweats, and nausea and vomiting.  She notes that the symptoms occur about every 2 weeks.  She has nightly fevers and night sweats.  The symptoms occur for days at a time, but have been taking longer and longer to resolve.  She denies any recent travel or sick contacts.  She denies any rash.  She denies any chest pain, shortness of breath, abdominal pain, diarrhea.  She denies any history of sexually transmitted infection.  She came to the hospital today because she passed out at the cancer center.  She describes getting dizzy and then hitting the floor.  [facility]  Her vitals were notable for a fever to 102.7.  She was satting normally on room air.  She was not tachycardic.  She was normotensive.  Her labs were notable for blood cell count of 4.1.  Worsening anemia to 7.  Worsening thrombocytopenia to 82.  Reticulocyte count of 0.9%.  INR was 1.5.  Sodium was 132.  Potassium 2.9.  Chloride 94.  Creatinine 0.9.  Lactate was 2.7, but improved to 0.8 with fluids.  Corrected calcium was 8.6.  Bilirubin was 2.1.  AST was 217 and ALT was 63.  LDH 661.  Her TSH was 0.14 and her free T4 was 1.18.  Urinalysis was notable for 2+ protein, occult blood, urobilinogen, negative nitrite and negative leukocyte esterase and few bacteria.  Had negative chest x-ray.  She had a CT chest abdomen pelvis as noted below.  In the emergency  department she got IV fluids, ceftriaxone.  The case was discussed with Dr. MHinton Raowho recommended transfer for further workup as well as LDH, haptoglobin, and retics.  MPRESSION: 1. Notably worsened adenopathy in the bilateral axillary and subpectoral regions, left infrahilar region, and upper abdomen/retroperitoneum compatible with lymphoma. There is also notable splenomegaly, splenic volume 610 cubic cm, with some mild splenic density heterogeneity. 2. New small to moderate-sized pericardial effusion. There is also a small amount of free pelvic fluid which is abnormal but nonspecific. 3. Stable thyroid goiter. 4. Trace bilateral pleural effusions. 5.  Aortic Atherosclerosis (ICD10-I70.0).  2/12 Oncology Assessment and Plan from Dr. MCasimiro Needle  ASSESSMENT AND PLAN:  This is a 53year old woman who has been having recurrent episodes of fever, cough, anorexia, nausea and vomiting for nearly 9 months now.  I have done numerous evaluations including lymph node biopsy, repeated scans, bone marrow examination and numerous labs including testing for infectious mono, parvovirus, HIV, RSV, hepatitis, influenza, Lyme disease, Rickettsia, and multiple myeloma.  Her bone marrow cultures were negative.  Her liver function tests were transiently abnormal but that has resolved and she did have splenomegaly which also resolved.  There has been no evidence of sarcoidosis or lymphoma in the node or bone marrow, and ACE was normal.  She has had numerous CT scans which have been inconclusive.  She has been to Infectious Disease as well as the CDC with no clear etiology found.  Now she has recurrent  bilateral axillary adenopathy which has increased again and is associated with fever and recurrent symptoms.  We have done full cultures and I have recommended that she see a surgeon for a removal of a node so that we can also do full cultures, lymphoma protocol, as well as routine histology.  I will give her support with IV  fluids today of 2 L normal saline as well as anti emetics and IV antibiotics.  She will likely need to return again later this week for the same.  She understands and agrees with this plan of care.  Her daughters are quite frantic, understandably so as they see her deteriorating steadily.  This is the worst I have seen her, and she has lost nearly 40 lb since June.  They requested a referral to an immunologist but I have no specific finding.  I absolutely agree with referral to a tertiary care center but would consider Infectious Disease or hematology.  At this time the patient is reluctant to go elsewhere so we will attempt to see what we find with the lymph node and the cultures.  I do not see that she is capable of working.  Review of Systems: As per HPI otherwise 10 point review of systems negative.   No past medical history on file. Hypothyroidism Hypertension  No past surgical history on file.  Right leg vein stripping 2012, basal cell carcinoma resected, fallopian tube unblocked   reports that she has never smoked. She has never used smokeless tobacco. She reports that she does not drink alcohol or use drugs.  No Known Allergies  No family history on file. Sister with ovarian cancer, father colon cancer  Prior to Admission medications   Medication Sig Start Date End Date Taking? Authorizing Provider  levothyroxine (SYNTHROID, LEVOTHROID) 75 MCG tablet Take 1 tablet by mouth daily. 11/23/17  Yes [provider]  Multiple Vitamin (MULTIVITAMIN WITH MINERALS) TABS tablet Take 1 tablet by mouth daily.   Yes [provider]  nebivolol (BYSTOLIC) 10 MG tablet Take 1 tablet by mouth daily. 10/13/16  Yes [provider]  ondansetron (ZOFRAN-ODT) 8 MG disintegrating tablet Take 1 tablet by mouth as needed for nausea/vomiting. PRN TID 02/27/18  Yes [provider]  PROAIR HFA 108 (90 Base) MCG/ACT inhaler Take 2 puffs by mouth 4 (four) times daily as needed.  02/27/18  Yes [provider]    Physical Exam: Vitals:   11/11/18 1631 11/11/18 1800 11/11/18 1900 11/11/18 1945  BP:  126/61 (!) 131/58 (!) 111/40  Pulse:  98 98 95  Resp:  (!) 21 (!) 22 (!) 29  Temp: (!) 102 F (38.9 C)   (!) 102.9 F (39.4 C)  TempSrc: Oral   Oral  SpO2:  96% 95% 96%  Weight: 66.5 kg     Height: '5\' 8"'  (1.727 m)       Constitutional: NAD, calm, comfortable Vitals:   11/11/18 1631 11/11/18 1800 11/11/18 1900 11/11/18 1945  BP:  126/61 (!) 131/58 (!) 111/40  Pulse:  98 98 95  Resp:  (!) 21 (!) 22 (!) 29  Temp: (!) 102 F (38.9 C)   (!) 102.9 F (39.4 C)  TempSrc: Oral   Oral  SpO2:  96% 95% 96%  Weight: 66.5 kg     Height: '5\' 8"'  (1.727 m)      Eyes: PERRL, lids and conjunctivae normal ENMT: Mucous membranes are moist.  Neck: normal, supple, no masses, no thyromegaly Respiratory: clear to auscultation bilaterally,  no wheezing, no crackles. Normal respiratory effort. No accessory muscle use.  Cardiovascular: Regular rate and rhythm, no murmurs / rubs / gallops. No extremity edema. Abdomen: no tenderness, no masses palpated. No hepatosplenomegaly. Bowel sounds positive.  Musculoskeletal: no clubbing / cyanosis. No joint deformity upper and lower extremities. Good ROM, no contractures. Normal muscle tone.  Skin: no rashes, lesions, ulcers. No induration Neurologic: CN 2-12 grossly intact. Sensation intact. Marland Kitchen  Psychiatric: Normal judgment and insight. Alert and oriented x 3. Normal mood.   Labs on Admission: I have personally reviewed following labs and imaging studies  CBC: No results for input(s): WBC, NEUTROABS, HGB, HCT, MCV, PLT in the last 168 hours. Basic Metabolic Panel: No results for input(s): NA, K, CL, CO2, GLUCOSE, BUN, CREATININE, CALCIUM, MG, PHOS in the last 168 hours. GFR: CrCl cannot be calculated (No successful lab value found.). Liver Function Tests: No results for input(s): AST, ALT, ALKPHOS, BILITOT, PROT, ALBUMIN in the  last 168 hours. No results for input(s): LIPASE, AMYLASE in the last 168 hours. No results for input(s): AMMONIA in the last 168 hours. Coagulation Profile: No results for input(s): INR, PROTIME in the last 168 hours. Cardiac Enzymes: No results for input(s): CKTOTAL, CKMB, CKMBINDEX, TROPONINI in the last 168 hours. BNP (last 3 results) No results for input(s): PROBNP in the last 8760 hours. HbA1C: No results for input(s): HGBA1C in the last 72 hours. CBG: Recent Labs  Lab 11/11/18 1641  GLUCAP 110*   Lipid Profile: No results for input(s): CHOL, HDL, LDLCALC, TRIG, CHOLHDL, LDLDIRECT in the last 72 hours. Thyroid Function Tests: No results for input(s): TSH, T4TOTAL, FREET4, T3FREE, THYROIDAB in the last 72 hours. Anemia Panel: No results for input(s): VITAMINB12, FOLATE, FERRITIN, TIBC, IRON, RETICCTPCT in the last 72 hours. Urine analysis: No results found for: COLORURINE, APPEARANCEUR, LABSPEC, PHURINE, GLUCOSEU, HGBUR, BILIRUBINUR, KETONESUR, PROTEINUR, UROBILINOGEN, NITRITE, LEUKOCYTESUR  Radiological Exams on Admission: No results found.  EKG: Independently reviewed. Sinus tach, no priors for comparison.  Assessment/Plan Active Problems:   FUO (fever of unknown origin)  Fever of Unknown Origin  Fever  Night Sweats  Weight Loss: 9 months of recurrent fever, night sweats, and weight loss.  She notes that these come every 2 weeks and last several days.  They are lasting longer and longer each time they occur.  CT scan today with worsened adenopathy in bilateral axillary, subpectoral regions, L infrahilar region, and upper abdomen/retroperitoneum, compatible with lymphoma.  She's had extensive work up with [name] in [place] Negative HIV, RSV, mono, hepatitis, parvovirus, lyme disease, rickettsia, multiple myeloma ACE was normal per OSH report.  Blood cultures pending from [facility] Bone marrow bx (07/02/18) with fungal and AFB cx negative (bone marrow aspirate -  slightly hypercellular bone marrow for age with trilineage hematopoesis - blood with normocytic hypochromic anemia)  US guided core LN biopsy (04/15/18) -> (LN without morphologic or immunophenotypic evidence of malignancy - flow with no monoclonal B cell or phenotypically aberrant T cell population identified) She's currently on ceftriaxone, but I suspect this can probably be discontinued if she remains stable tomorrow as suspect this is likely lymphoma. Consult surgery for LN biopsy for concern for lymphoma.  Would also send for AFB and fungal cultures.  Consult infectious disease, Dr. Megan Salon to see in AM  Anemia  Thrombocytopenia: Progressive over past few days.  Hb down from 10 in Late January, to 7 today.  Platelets have fluctuated, but are also low (on 2/5 was 126, but on 2/13,  was 186).  ? Splenic sequestration with splenomegaly on CT?  Will also w/u for hemolysis. S/p 1 unit pRBC at OSH Haptoglobin pending at [facility] retic index, but LDH slightly elevated (661 - ULN 618) and bili slightly elevated to 2.1.f Follow LDH, haptoglobin, coombs, retic, PT/INR/PTT Smear for pathologist review Consider discussion with heme/onc as needed  Splenomegaly: likely 2/2 #1, may be playing role in above, follow  Pericardial Effusion: small to moderate on imaging, follow echo  Elevated Liver Enzymes: likely 2/2 above, follow  Syncope: suspect 2/2 anemia above.  Follow echo with CT scan with concern for pericardial effusion.  Continue telemetry.  EKG with sinus tach.  Continue IVF and follow sx after transfusion.   Lactic Acidosis: improved with IVF at [place]  Hypokalemia: replace and follow  Hypothyroidism  Thyroid Goiter: continue synthroid  Hypertension: continue bystolic  DVT prophylaxis: SCD  Code Status: full  Family Communication: ex husband and daughter at bedside Disposition Plan: pending improvement Consults called: surgery, infectious disease  Admission  status: stepdown    Fayrene Helper MD [facility] Pager AMION  If 7PM-7AM, please contact night-coverage [email or web address] Password [facility]  11/11/2018, 8:22 PM

## 2018-11-11 NOTE — Consult Note (Signed)
Re:   Cindy Ward DOB:   1966/07/02 MRN:   [id number]  Chief Complaint Adenopathy  ASSESEMENT AND PLAN: 1.  Adenopathy  Discussed with patient and her family about a potential lymph node biopsy.  She was trying to get this electively done, but she has became more ill and was transferred to [place]: Keep her NPO at midnight and Dr. Ninfa Linden will evaluate tomorrow for possible open lymph node biopsy.  2.  Anemia 3.  Thrombocytopenia 4.  Weight loss  No chief complaint on file.  PHYSICIAN REQUESTING CONSULTATION:  Dr. Fayrene Helper, [facility]  HISTORY OF PRESENT ILLNESS: Cindy Ward is a 53 y.o. (DOB: 1965-12-28)  white female whose primary care physician is Cindy Hipps, MD.  Her daughter, Cindy Ward, and ex husband, Cindy Ward, are at the bedside.  There is nothing in Epic at this time, which makes writing a history a little difficult.  Much of the workup has been done at [facility] and we have a limited paper chart.   The patient claims to have had symptoms for about 9 months.  She will have fever, chills, night sweats, and vomiting.  These will go through cycles.  First, the cycles lasted 4 to 5 days.  More recently they have gone as long as 8 or 9 days.  She has lost about 60 pounds over the last 9 months.  She has seen her primary care physician, Dr. Helene Ward, she is seen Cindy Po, NP, from [facility] Infectious disease in both August and October 2019, and she seen Dr. Hosie Ward at the [facility] in [place].  She is undergone bone marrow biopsies and a core biopsy of her right axillary node which have been nondiagnostic.  She has become more ill and was transferred up to [facility] long hospital today.  She did have a CT scan and aspiration that shows axillary and subpectoral adenopathy.   Unfortunate those images are not available at this time in Epic.  Info from [place]: WBC - 11/11/2018 - 4,100 Hgb - 11/11/2018 - 7.0 Platelets - 11/11/2018 - 87,000    No  past medical history on file.   No past surgical history on file.    Current Facility-Administered Medications  Medication Dose Route Frequency Provider Last Rate Last Dose  . acetaminophen (TYLENOL) tablet 650 mg  650 mg Oral Q6H PRN Elodia Florence., MD       Or  . acetaminophen (TYLENOL) suppository 650 mg  650 mg Rectal Q6H PRN Elodia Florence., MD      . Derrill Memo ON 11/12/2018] cefTRIAXone (ROCEPHIN) 2 g in sodium chloride 0.9 % 100 mL IVPB  2 g Intravenous Q24H Elodia Florence., MD      . lactated ringers infusion   Intravenous Continuous Elodia Florence., MD 75 mL/hr at 11/11/18 1832    . [START ON 11/12/2018] levothyroxine (SYNTHROID, LEVOTHROID) tablet 75 mcg  75 mcg Oral Q0600 Elodia Florence., MD      . MEDLINE mouth rinse  15 mL Mouth Rinse BID Elodia Florence., MD      . nebivolol (BYSTOLIC) tablet 10 mg  10 mg Oral Daily Elodia Florence., MD      . ondansetron [facility]) tablet 4 mg  4 mg Oral Q6H PRN Elodia Florence., MD       Or  . ondansetron [facility]) injection 4 mg  4 mg Intravenous Q6H PRN Alben Deeds  Brooke Bonito., MD      . oxyCODONE (Oxy IR/ROXICODONE) immediate release tablet 5 mg  5 mg Oral Q4H PRN Elodia Florence., MD      . polyethylene glycol (MIRALAX / GLYCOLAX) packet 17 g  17 g Oral Daily PRN Elodia Florence., MD         No Known Allergies  REVIEW OF SYSTEMS: Skin:  No history of rash.  No history of abnormal moles. Infection:  No history of hepatitis or HIV.  No history of MRSA. Neurologic:  No history of stroke.  No history of seizure.  No history of headaches. Cardiac:  No history of hypertension. No history of heart disease.  No history of prior cardiac catheterization.  No history of seeing a cardiologist. Pulmonary:  Does not smoke cigarettes.  No asthma or bronchitis.  No OSA/CPAP.  Endocrine:  No diabetes. No thyroid disease. Gastrointestinal:  See HPI Urologic:  No history of kidney stones.  No  history of bladder infections. Musculoskeletal:  No history of joint or back disease. Hematologic:  No bleeding disorder.  No history of anemia.  Not anticoagulated. Psycho-social:  The patient is oriented.   The patient has no obvious psychologic or social impairment to understanding our conversation and plan.  SOCIAL and FAMILY HISTORY:  Her daughter, Cindy Ward, and ex husband, Cindy Ward, are at the bedside.  PHYSICAL EXAM: BP 126/61   Pulse 98   Temp (!) 102 F (38.9 C) (Oral)   Resp (!) 21   Ht 5\' 8"  (1.727 m)   Wt 66.5 kg   LMP  (LMP Unknown)   SpO2 96%   BMI 22.31 kg/m   General: Older WF who is alert.  For loosing 60 pounds, she does not look that sick. Skin:  Inspection and palpation - no mass or rash. Eyes:  Conjunctiva and lids unremarkable.            Pupils are equal Ears, Nose, Mouth, and Throat:  Ears and nose unremarkable            Lips and teeth are unremarable. Neck: Supple. No mass, trachea midline.  She has a thyroid fullness Lymph Nodes:  She has some palpable supraclavicular, axillary and inguinal adenopathy Lungs: Normal respiratory effort.  Clear to auscultation and symmetric breath sounds. Heart:  Palpation of the heart is normal.            Auscultation: RRR. No murmur or rub.  Abdomen: Soft.  No tenderness. No hernia.             Normal bowel sounds.  She has tattoos on her abdomen, but no mass that I can feel. Rectal: Not done. Musculoskeletal:  Normal gait.            Good muscle strength and ROM  in upper and lower extremities.  Neurologic:  Grossly intact to motor and sensory function. Psychiatric: Normal judgement and insight. Behavior is normal.            Oriented to time, person, place.   DATA REVIEWED, COUNSELING AND COORDINATION OF CARE: Epic notes reviewed. Counseling and coordination of care exceeded more than 50% of the time spent with patient. Total time spent with patient and charting: 50 minutes.  Alphonsa Overall, MD,   [facility], [address]  [place], [place]    [place] Phone:  [phone] FAX:  [phone]

## 2018-11-12 ENCOUNTER — Encounter (HOSPITAL_COMMUNITY)
Admission: AD | Disposition: A | Discharge status: Home Health | Payer: Self-pay | Source: Other Acute Inpatient Hospital | Attending: Internal Medicine

## 2018-11-12 ENCOUNTER — Other Ambulatory Visit: Payer: Self-pay

## 2018-11-12 ENCOUNTER — Encounter (HOSPITAL_COMMUNITY): Payer: Self-pay | Admitting: Nurse Practitioner

## 2018-11-12 ENCOUNTER — Inpatient Hospital Stay (HOSPITAL_COMMUNITY): Payer: Medicaid Other

## 2018-11-12 ENCOUNTER — Inpatient Hospital Stay (HOSPITAL_COMMUNITY): Payer: Medicaid Other | Admitting: Certified Registered"

## 2018-11-12 DIAGNOSIS — R509 Fever, unspecified: Secondary | ICD-10-CM

## 2018-11-12 DIAGNOSIS — I313 Pericardial effusion (noninflammatory): Secondary | ICD-10-CM

## 2018-11-12 DIAGNOSIS — R161 Splenomegaly, not elsewhere classified: Secondary | ICD-10-CM

## 2018-11-12 DIAGNOSIS — D696 Thrombocytopenia, unspecified: Secondary | ICD-10-CM

## 2018-11-12 DIAGNOSIS — R10812 Left upper quadrant abdominal tenderness: Secondary | ICD-10-CM

## 2018-11-12 DIAGNOSIS — E063 Autoimmune thyroiditis: Secondary | ICD-10-CM | POA: Diagnosis present

## 2018-11-12 DIAGNOSIS — R748 Abnormal levels of other serum enzymes: Secondary | ICD-10-CM

## 2018-11-12 DIAGNOSIS — I10 Essential (primary) hypertension: Secondary | ICD-10-CM | POA: Diagnosis present

## 2018-11-12 DIAGNOSIS — D649 Anemia, unspecified: Secondary | ICD-10-CM

## 2018-11-12 DIAGNOSIS — R591 Generalized enlarged lymph nodes: Secondary | ICD-10-CM

## 2018-11-12 DIAGNOSIS — I700 Atherosclerosis of aorta: Secondary | ICD-10-CM | POA: Diagnosis present

## 2018-11-12 DIAGNOSIS — R634 Abnormal weight loss: Secondary | ICD-10-CM

## 2018-11-12 DIAGNOSIS — R740 Nonspecific elevation of levels of transaminase and lactic acid dehydrogenase [LDH]: Secondary | ICD-10-CM

## 2018-11-12 DIAGNOSIS — E049 Nontoxic goiter, unspecified: Secondary | ICD-10-CM | POA: Diagnosis present

## 2018-11-12 HISTORY — PX: AXILLARY LYMPH NODE BIOPSY: SHX5737

## 2018-11-12 LAB — ECHOCARDIOGRAM COMPLETE
Height: 68 in
WEIGHTICAEL: 2347.2 [oz_av]

## 2018-11-12 LAB — HIV ANTIBODY (ROUTINE TESTING W REFLEX): HIV Screen 4th Generation wRfx: NONREACTIVE

## 2018-11-12 LAB — PATHOLOGIST SMEAR REVIEW

## 2018-11-12 LAB — TSH: TSH: 0.406 u[IU]/mL (ref 0.350–4.500)

## 2018-11-12 LAB — T4, FREE: Free T4: 0.94 ng/dL (ref 0.82–1.77)

## 2018-11-12 LAB — HEMOGLOBIN AND HEMATOCRIT, BLOOD
HCT: 25.7 % — ABNORMAL LOW (ref 36.0–46.0)
Hemoglobin: 8.1 g/dL — ABNORMAL LOW (ref 12.0–15.0)

## 2018-11-12 SURGERY — AXILLARY LYMPH NODE BIOPSY
Anesthesia: General | Site: Axilla | Laterality: Left

## 2018-11-12 MED ORDER — PROPOFOL 10 MG/ML IV BOLUS
INTRAVENOUS | Status: AC
  Filled 2018-11-12: qty 20

## 2018-11-12 MED ORDER — DEXAMETHASONE SODIUM PHOSPHATE 10 MG/ML IJ SOLN
INTRAMUSCULAR | Status: DC | PRN
  Administered 2018-11-12: 6 mg via INTRAVENOUS

## 2018-11-12 MED ORDER — SCOPOLAMINE 1 MG/3DAYS TD PT72
MEDICATED_PATCH | TRANSDERMAL | Status: AC
  Filled 2018-11-12: qty 1

## 2018-11-12 MED ORDER — DEXAMETHASONE SODIUM PHOSPHATE 10 MG/ML IJ SOLN
INTRAMUSCULAR | Status: AC
  Filled 2018-11-12: qty 1

## 2018-11-12 MED ORDER — MIDAZOLAM HCL 5 MG/5ML IJ SOLN
INTRAMUSCULAR | Status: DC | PRN
  Administered 2018-11-12 (×2): 1 mg via INTRAVENOUS

## 2018-11-12 MED ORDER — FENTANYL CITRATE (PF) 100 MCG/2ML IJ SOLN
INTRAMUSCULAR | Status: DC | PRN
  Administered 2018-11-12: 20 ug via INTRAVENOUS
  Administered 2018-11-12: 75 ug via INTRAVENOUS
  Administered 2018-11-12: 50 ug via INTRAVENOUS

## 2018-11-12 MED ORDER — ONDANSETRON HCL 4 MG/2ML IJ SOLN
INTRAMUSCULAR | Status: DC | PRN
  Administered 2018-11-12: 4 mg via INTRAVENOUS

## 2018-11-12 MED ORDER — PROPOFOL 10 MG/ML IV BOLUS
INTRAVENOUS | Status: DC | PRN
  Administered 2018-11-12: 150 mg via INTRAVENOUS

## 2018-11-12 MED ORDER — 0.9 % SODIUM CHLORIDE (POUR BTL) OPTIME
TOPICAL | Status: DC | PRN
  Administered 2018-11-12: 1000 mL

## 2018-11-12 MED ORDER — OXYCODONE HCL 5 MG PO TABS: 5.0000 mg | ORAL_TABLET | Freq: Once | ORAL | Status: DC | PRN

## 2018-11-12 MED ORDER — FENTANYL CITRATE (PF) 250 MCG/5ML IJ SOLN
INTRAMUSCULAR | Status: AC
  Filled 2018-11-12: qty 5

## 2018-11-12 MED ORDER — HYDROMORPHONE HCL 1 MG/ML IJ SOLN
0.5000 mg | INTRAMUSCULAR | Status: DC | PRN
  Administered 2018-11-25 – 2018-11-26 (×2): 1 mg via INTRAVENOUS
  Filled 2018-11-12 (×3): qty 1

## 2018-11-12 MED ORDER — LIDOCAINE 2% (20 MG/ML) 5 ML SYRINGE
INTRAMUSCULAR | Status: AC
  Filled 2018-11-12: qty 5

## 2018-11-12 MED ORDER — LACTATED RINGERS IV SOLN
INTRAVENOUS | Status: DC | PRN
  Administered 2018-11-12: 1000 mL
  Administered 2018-11-12: 13:00:00 via INTRAVENOUS

## 2018-11-12 MED ORDER — OXYCODONE HCL 5 MG/5ML PO SOLN: 5.0000 mg | Freq: Once | ORAL | Status: DC | PRN

## 2018-11-12 MED ORDER — OXYCODONE HCL 5 MG PO TABS
5.0000 mg | ORAL_TABLET | ORAL | Status: DC | PRN
  Administered 2018-11-22 – 2018-11-28 (×2): 5 mg via ORAL
  Filled 2018-11-12 (×2): qty 1

## 2018-11-12 MED ORDER — LIDOCAINE HCL (CARDIAC) PF 50 MG/5ML IV SOSY
PREFILLED_SYRINGE | INTRAVENOUS | Status: DC | PRN
  Administered 2018-11-12: 25 mg via INTRAVENOUS
  Administered 2018-11-12: 50 mg via INTRAVENOUS

## 2018-11-12 MED ORDER — SCOPOLAMINE 1 MG/3DAYS TD PT72
MEDICATED_PATCH | TRANSDERMAL | Status: DC | PRN
  Administered 2018-11-12: 1 via TRANSDERMAL

## 2018-11-12 MED ORDER — ONDANSETRON HCL 4 MG/2ML IJ SOLN
INTRAMUSCULAR | Status: AC
  Filled 2018-11-12: qty 2

## 2018-11-12 MED ORDER — BUPIVACAINE HCL (PF) 0.5 % IJ SOLN
INTRAMUSCULAR | Status: AC
  Filled 2018-11-12: qty 30

## 2018-11-12 MED ORDER — MIDAZOLAM HCL 2 MG/2ML IJ SOLN
INTRAMUSCULAR | Status: AC
  Filled 2018-11-12: qty 2

## 2018-11-12 MED ORDER — SUCCINYLCHOLINE CHLORIDE 200 MG/10ML IV SOSY
PREFILLED_SYRINGE | INTRAVENOUS | Status: AC
  Filled 2018-11-12: qty 10

## 2018-11-12 MED ORDER — LACTATED RINGERS IV SOLN: INTRAVENOUS | Status: DC

## 2018-11-12 MED ORDER — SCOPOLAMINE 1 MG/3DAYS TD PT72
1.0000 | MEDICATED_PATCH | TRANSDERMAL | Status: DC
  Administered 2018-11-15 – 2018-12-03 (×7): 1.5 mg via TRANSDERMAL
  Filled 2018-11-12 (×7): qty 1

## 2018-11-12 MED ORDER — BUPIVACAINE HCL (PF) 0.5 % IJ SOLN
INTRAMUSCULAR | Status: DC | PRN
  Administered 2018-11-12: 18 mL

## 2018-11-12 MED ORDER — ONDANSETRON HCL 4 MG/2ML IJ SOLN: 4.0000 mg | Freq: Once | INTRAMUSCULAR | Status: DC | PRN

## 2018-11-12 MED ORDER — FENTANYL CITRATE (PF) 100 MCG/2ML IJ SOLN: 25.0000 ug | INTRAMUSCULAR | Status: DC | PRN

## 2018-11-12 SURGICAL SUPPLY — 38 items
APPLIER CLIP 9.375 MED OPEN (MISCELLANEOUS) ×3
BLADE HEX COATED 2.75 (ELECTRODE) ×3 IMPLANT
BLADE SURG SZ10 CARB STEEL (BLADE) ×3 IMPLANT
CLIP APPLIE 9.375 MED OPEN (MISCELLANEOUS) ×1 IMPLANT
COVER SURGICAL LIGHT HANDLE (MISCELLANEOUS) ×3 IMPLANT
COVER WAND RF STERILE (DRAPES) ×3 IMPLANT
DECANTER SPIKE VIAL GLASS SM (MISCELLANEOUS) ×3 IMPLANT
DERMABOND ADVANCED (GAUZE/BANDAGES/DRESSINGS) ×2
DERMABOND ADVANCED .7 DNX12 (GAUZE/BANDAGES/DRESSINGS) ×1 IMPLANT
DRAPE LAPAROSCOPIC ABDOMINAL (DRAPES) ×3 IMPLANT
DRAPE LAPAROTOMY T 102X78X121 (DRAPES) IMPLANT
DRAPE LAPAROTOMY TRNSV 102X78 (DRAPE) IMPLANT
DRAPE SHEET LG 3/4 BI-LAMINATE (DRAPES) IMPLANT
ELECT PENCIL ROCKER SW 15FT (MISCELLANEOUS) ×3 IMPLANT
ELECT REM PT RETURN 15FT ADLT (MISCELLANEOUS) ×3 IMPLANT
GAUZE SPONGE 4X4 12PLY STRL (GAUZE/BANDAGES/DRESSINGS) IMPLANT
GLOVE BIO SURGEON STRL SZ7 (GLOVE) ×9 IMPLANT
GLOVE BIOGEL PI IND STRL 7.0 (GLOVE) ×1 IMPLANT
GLOVE BIOGEL PI IND STRL 7.5 (GLOVE) ×3 IMPLANT
GLOVE BIOGEL PI INDICATOR 7.0 (GLOVE) ×2
GLOVE BIOGEL PI INDICATOR 7.5 (GLOVE) ×6
GLOVE SURG SIGNA 7.5 PF LTX (GLOVE) ×3 IMPLANT
GOWN STRL REUS W/ TWL XL LVL3 (GOWN DISPOSABLE) ×1 IMPLANT
GOWN STRL REUS W/TWL LRG LVL3 (GOWN DISPOSABLE) ×3 IMPLANT
GOWN STRL REUS W/TWL XL LVL3 (GOWN DISPOSABLE) ×2 IMPLANT
KIT BASIN OR (CUSTOM PROCEDURE TRAY) ×3 IMPLANT
MARKER SKIN DUAL TIP RULER LAB (MISCELLANEOUS) ×3 IMPLANT
NEEDLE HYPO 25X1 1.5 SAFETY (NEEDLE) ×3 IMPLANT
NS IRRIG 1000ML POUR BTL (IV SOLUTION) ×3 IMPLANT
PACK BASIC VI WITH GOWN DISP (CUSTOM PROCEDURE TRAY) ×3 IMPLANT
SPONGE LAP 18X18 RF (DISPOSABLE) IMPLANT
SPONGE LAP 4X18 RFD (DISPOSABLE) IMPLANT
STAPLER VISISTAT 35W (STAPLE) IMPLANT
SUT MNCRL AB 4-0 PS2 18 (SUTURE) IMPLANT
SUT VIC AB 3-0 SH 18 (SUTURE) IMPLANT
SYR CONTROL 10ML LL (SYRINGE) ×3 IMPLANT
TOWEL OR 17X26 10 PK STRL BLUE (TOWEL DISPOSABLE) ×3 IMPLANT
YANKAUER SUCT BULB TIP 10FT TU (MISCELLANEOUS) ×3 IMPLANT

## 2018-11-12 NOTE — Anesthesia Preprocedure Evaluation (Signed)
Anesthesia Evaluation  Patient identified by MRN, date of birth, ID band Patient awake    Reviewed: Allergy & Precautions, H&P , NPO status , Patient's Chart, lab work & pertinent test results  Airway Mallampati: II   Neck ROM: full    Dental   Pulmonary neg pulmonary ROS,    breath sounds clear to auscultation       Cardiovascular hypertension,  Rhythm:regular Rate:Normal     Neuro/Psych    GI/Hepatic   Endo/Other    Renal/GU      Musculoskeletal   Abdominal   Peds  Hematology  (+) Blood dyscrasia, anemia ,   Anesthesia Other Findings   Reproductive/Obstetrics                             Anesthesia Physical Anesthesia Plan  ASA: II  Anesthesia Plan: General   Post-op Pain Management:    Induction: Intravenous  PONV Risk Score and Plan: 3 and Ondansetron, Dexamethasone, Midazolam and Treatment may vary due to age or medical condition  Airway Management Planned: LMA  Additional Equipment:   Intra-op Plan:   Post-operative Plan:   Informed Consent: I have reviewed the patients History and Physical, chart, labs and discussed the procedure including the risks, benefits and alternatives for the proposed anesthesia with the patient or authorized representative who has indicated his/her understanding and acceptance.       Plan Discussed with: CRNA, Anesthesiologist and Surgeon  Anesthesia Plan Comments:         Anesthesia Quick Evaluation

## 2018-11-12 NOTE — Progress Notes (Signed)
PROGRESS NOTE    Cindy Ward  DPO:[id number] DOB: 1965-11-16 DOA: 11/11/2018 PCP: [name], MD    Brief Narrative:  53 y.o. female with medical history significant of hypertension hypothyroidism essentially outside hospital with recurrent nausea, vomiting, fevers, and night sweats.  Patient notes a 67-monthhistory of cough, fever, chills, night sweats, and nausea and vomiting.  She notes that the symptoms occur about every 2 weeks.  She has nightly fevers and night sweats.  The symptoms occur for days at a time, but have been taking longer and longer to resolve.  She denies any recent travel or sick contacts.  She denies any rash.  She denies any chest pain, shortness of breath, abdominal pain, diarrhea.  She denies any history of sexually transmitted infection.  She came to the hospital today because she passed out at the cancer center.  She describes getting dizzy and then hitting the floor.  [facility]  Her vitals were notable for a fever to 102.7.  She was satting normally on room air.  She was not tachycardic.  She was normotensive.  Her labs were notable for blood cell count of 4.1.  Worsening anemia to 7.  Worsening thrombocytopenia to 82.  Reticulocyte count of 0.9%.  INR was 1.5.  Sodium was 132.  Potassium 2.9.  Chloride 94.  Creatinine 0.9.  Lactate was 2.7, but improved to 0.8 with fluids.  Corrected calcium was 8.6.  Bilirubin was 2.1.  AST was 217 and ALT was 63.  LDH 661.  Her TSH was 0.14 and her free T4 was 1.18.  Urinalysis was notable for 2+ protein, occult blood, urobilinogen, negative nitrite and negative leukocyte esterase and few bacteria.  Had negative chest x-ray.  She had a CT chest abdomen pelvis as noted below.  In the emergency department she got IV fluids, ceftriaxone.  The case was discussed with Dr. MHinton Raowho recommended transfer for further workup as well as LDH, haptoglobin, and retics.  Assessment &  Plan:   Principal Problem:   FUO (fever of unknown origin) Active Problems:   Lymphadenopathy   Splenomegaly   Liver enzyme elevation   Thrombocytopenia (HCC)   Anemia   Unintentional weight loss   Hashimoto's thyroiditis   Goiter   Hypertension   Aortic atherosclerosis (HCC)  Fever of Unknown Origin  Fever  Night Sweats  Weight Loss:  -9 months of recurrent fever, night sweats, and weight loss.  She notes that these come every 2 weeks and last several days.  They are lasting longer and longer each time they occur.   -Recent CT scan demonstrated worsened adenopathy in bilateral axillary, subpectoral regions, L infrahilar region, and upper abdomen/retroperitoneum, compatible with lymphoma.  -She's had extensive work up with Dr. MHinton Raoin AAscension-Negative HIV, RSV, mono, hepatitis, parvovirus, lyme disease, rickettsia, multiple myeloma -ACE was normal per OSH report.  -Bone marrow bx (07/02/18) with fungal and AFB cx negative (bone marrow aspirate - slightly hypercellular bone marrow for age with trilineage hematopoesis - blood with normocytic hypochromic anemia)  -[place]ore[place]ided core LN biopsy (04/15/18) -> (LN without morphologic or - immunophenotypic evidence of malignancy - flow with no monoclonal B cell  or phenotypically aberrant T cell population identified) -ID consulted. Appreciate input by Dr. CMegan Salon Strong suspicion for lymphoma with recommendation to d/c rocephin -General surgery consulted for node biopsy, performed 2/18, pending results  Anemia  Thrombocytopenia:  -Progressive over past few days.   -  Hb down from 10 in Late January, to 7 at time of admit -S/p 1 unit pRBC at OSH -Repeat CBC in AM  Splenomegaly:  -likely 2/2 #1 -Stable at this time  Pericardial Effusion: small to moderate on imaging -2d echo reviewed, appears normal  Elevated Liver Enzymes: likely 2/2 above -repeat cmp in AM  Syncope: suspect 2/2 anemia above.  Follow echo with CT scan  with concern for pericardial effusion.  Continue telemetry.  EKG with sinus tach.  -Continue hydration as tolerated  Lactic Acidosis: improved with IVF at [place]  Hypokalemia: replaced at time of presentation -Repeat bmet in AM  Hypothyroidism  Thyroid Goiter: continue synthroid as tolerated  Hypertension: continue bystolic  DVT prophylaxis: SCD's Code Status: Full Family Communication: Pt in room, family not at bedside Disposition Plan: Uncertain at this time  Consultants:   General Surgery  ID  Procedures:     Antimicrobials: Anti-infectives (From admission, onward)   Start     Dose/Rate Route Frequency Ordered Stop   11/12/18 0800  cefTRIAXone (ROCEPHIN) 2 g in sodium chloride 0.9 % 100 mL IVPB  Status:  Discontinued     2 g 200 mL/hr over 30 Minutes Intravenous Every 24 hours 11/11/18 1711 11/12/18 1057       Subjective: Reports lack of appeitite this AM  Objective: Vitals:   11/12/18 1200 11/12/18 1256 11/12/18 1354 11/12/18 1430  BP: (!) 137/55 120/76 112/63   Pulse: 86 85 74   Resp: (!) 34 (!) 24 19   Temp:   99.8 F (37.7 C) 98.8 F (37.1 C)  TempSrc:      SpO2: 94% 96% 100%   Weight:      Height:        Intake/Output Summary (Last 24 hours) at 11/12/2018 1504 Last data filed at 11/12/2018 1430 Gross per 24 hour  Intake 4918.15 ml  Output 375 ml  Net 4543.15 ml   Filed Weights   11/11/18 1631  Weight: 66.5 kg    Examination:  General exam: Appears calm and comfortable  Respiratory system: Clear to auscultation. Respiratory effort normal. Cardiovascular system: S1 & S2 heard, RRR Gastrointestinal system: Abdomen is nondistended, soft and nontender. No organomegaly or masses felt. Normal bowel sounds heard. Central nervous system: Alert and oriented. No focal neurological deficits. Extremities: Symmetric 5 x 5 power. Skin: No rashes, lesions  Psychiatry: Judgement and insight appear normal. Mood & affect appropriate.   Data  Reviewed: I have personally reviewed following labs and imaging studies  CBC: Recent Labs  Lab 11/11/18 2028 11/12/18 0315  WBC 4.4  --   NEUTROABS 3.2  --   HGB 6.5* 8.1*  HCT 20.3* 25.7*  MCV 82.5  --   PLT 70*  --    Basic Metabolic Panel: Recent Labs  Lab 11/11/18 2028  NA 135  K 3.3*  CL 101  CO2 25  GLUCOSE 113*  BUN 17  CREATININE 0.90  CALCIUM 7.5*   GFR: Estimated Creatinine Clearance: 72.9 mL/min (by C-G formula based on SCr of 0.9 mg/dL). Liver Function Tests: Recent Labs  Lab 11/11/18 2028  AST 190*  ALT 62*  ALKPHOS 96  BILITOT 1.9*  PROT 6.1*  ALBUMIN 2.3*   No results for input(s): LIPASE, AMYLASE in the last 168 hours. No results for input(s): AMMONIA in the last 168 hours. Coagulation Profile: Recent Labs  Lab 11/11/18 2028  INR 1.84   Cardiac Enzymes: No results for input(s): CKTOTAL, CKMB, CKMBINDEX, TROPONINI in the last  168 hours. BNP (last 3 results) No results for input(s): PROBNP in the last 8760 hours. HbA1C: No results for input(s): HGBA1C in the last 72 hours. CBG: Recent Labs  Lab 11/11/18 1641  GLUCAP 110*   Lipid Profile: No results for input(s): CHOL, HDL, LDLCALC, TRIG, CHOLHDL, LDLDIRECT in the last 72 hours. Thyroid Function Tests: Recent Labs    11/11/18 2122  TSH 0.406  FREET4 0.94   Anemia Panel: Recent Labs    11/11/18 2028  RETICCTPCT 1.2   Sepsis Labs: No results for input(s): PROCALCITON, LATICACIDVEN in the last 168 hours.  Recent Results (from the past 240 hour(s))  MRSA PCR Screening     Status: None   Collection Time: 11/11/18  4:21 PM  Result Value Ref Range Status   MRSA by PCR NEGATIVE NEGATIVE Final    Comment:        The GeneXpert MRSA Assay (FDA approved for NASAL specimens only), is one component of a comprehensive MRSA colonization surveillance program. It is not intended to diagnose MRSA infection nor to guide or monitor treatment for MRSA infections. Performed at [facility], [place] [address] [place], [place] [address]      Radiology Studies: Dg Chest Port 1 View  Result Date: 11/11/2018 CLINICAL DATA:  Fever EXAM: PORTABLE CHEST 1 VIEW COMPARISON:  None. FINDINGS: Mild cardiomegaly. No focal consolidation or effusion. No pneumothorax. IMPRESSION: No active disease. Electronically Signed   By: Donavan Foil M.D.   On: 11/11/2018 21:59    Scheduled Meds: . sodium chloride   Intravenous Once  . feeding supplement  1 Container Oral TID BM  . levothyroxine  75 mcg Oral Q0600  . mouth rinse  15 mL Mouth Rinse BID  . nebivolol  10 mg Oral Daily  . scopolamine  1 patch Transdermal Q72H  . scopolamine       Continuous Infusions: . lactated ringers 125 mL/hr at 11/12/18 1155     LOS: 1 day   Marylu Lund, MD [facility] Pager On Amion  If 7PM-7AM, please contact night-coverage 11/12/2018, 3:04 PM

## 2018-11-12 NOTE — Consult Note (Signed)
[facility] for Infectious Disease    Date of Admission:  11/11/2018           Day 2 ceftriaxone       Reason for Consult: Protracted fever and generalized lymphadenopathy    Referring Provider: Dr. 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  I think infection is extremely unlikely.  I will stop empiric ceftriaxone and await results of lymph node biopsy.  Plan: 1. Discontinue ceftriaxone 2. Await results of lymph node biopsy  Principal Problem:   FUO (fever of unknown origin) Active Problems:   Lymphadenopathy   Splenomegaly   Liver enzyme elevation   Thrombocytopenia (HCC)   Anemia   Unintentional weight loss   Hashimoto's thyroiditis   Goiter   Hypertension   Aortic atherosclerosis (HCC)   Scheduled Meds: . sodium chloride   Intravenous Once  . feeding supplement  1 Container Oral TID BM  . levothyroxine  75 mcg Oral Q0600  . mouth rinse  15 mL Mouth Rinse BID  . nebivolol  10 mg Oral Daily   Continuous Infusions: . cefTRIAXone (ROCEPHIN)  IV Stopped (11/12/18 0824)  . lactated ringers 125 mL/hr at 11/12/18 0833   PRN Meds:.acetaminophen **OR** acetaminophen, ondansetron **OR** ondansetron (ZOFRAN) IV, oxyCODONE, polyethylene glycol  HPI: Cindy Ward is a 53 y.o. female who began having intermittent fevers, cough and nausea 11 months ago.  She has been seen in the emergency department at [facility], the cancer center at [facility] and on 2 occasions by my partner, Terri Piedra FNP.  She has had a fairly extensive evaluation that has been nondiagnostic including the following HIV antibody, ANA, RA, influenza, RSV, Monospot, hepatitis serologies, Lyme serologies, parvovirus serologies, RMSF serologies, ACE level, SPEP, iron, B12, peripheral blood flow cytometry, right axillary lymph node core biopsy and bone marrow biopsy.  She has progressive generalized lymphadenopathy and splenomegaly.  She has  developed progressive normocytic anemia, thrombocytopenia, elevated liver enzymes and elevated LDH level.  She has lost over 60 pounds unintentionally.  She had a syncopal episode at the [place] center yesterday and was transferred to their emergency department.  CT scan showed progressive lymphadenopathy and splenomegaly.  Urine and blood cultures were obtained and she was started on ceftriaxone before transfer here yesterday.  Her cultures are negative at 24 hours.  She has no evidence of pneumonia by chest x-ray.  She is scheduled for a left axillary lymph node excisional biopsy later today.   Review of Systems: Review of Systems  Constitutional: Positive for chills, diaphoresis, fever, malaise/fatigue and weight loss.  HENT: Negative for congestion and sore throat.   Respiratory: Positive for cough. Negative for hemoptysis, sputum production, shortness of breath and wheezing.   Cardiovascular: Negative for chest pain.  Gastrointestinal: Positive for nausea and vomiting. Negative for abdominal pain and diarrhea.  Genitourinary: Negative for dysuria.  Musculoskeletal: Negative for back pain, joint pain and myalgias.  Skin: Negative for rash.  Neurological: Negative for headaches.    History reviewed. No pertinent past medical history.  Social History   Tobacco Use  . Smoking status: Never Smoker  . Smokeless tobacco: Never Used  Substance Use Topics  . Alcohol use: Never    Frequency: Never  . Drug use: Never    History reviewed. No pertinent family history. No Known Allergies  OBJECTIVE: Blood pressure 135/67, pulse 76, temperature 100.3 F (37.9 C), temperature source Oral, resp. rate Marland Kitchen)  29, height '5\' 8"'  (1.727 m), weight 66.5 kg, SpO2 100 %.  Physical Exam Constitutional:      Comments: She is resting quietly in bed.  HENT:     Mouth/Throat:     Pharynx: No oropharyngeal exudate.  Eyes:     Conjunctiva/sclera: Conjunctivae normal.  Neck:      Musculoskeletal: Neck supple.     Comments: Fullness over her thyroid. Cardiovascular:     Rate and Rhythm: Normal rate and regular rhythm.     Heart sounds: No murmur.  Pulmonary:     Effort: Pulmonary effort is normal.     Breath sounds: Normal breath sounds.  Abdominal:     General: Abdomen is flat.     Palpations: Abdomen is soft.     Tenderness: There is no abdominal tenderness.     Comments: Possible palpable spleen tip and left upper quadrant on deep inspiration.  Musculoskeletal:        General: No swelling or tenderness.  Lymphadenopathy:     Upper Body:     Right upper body: Supraclavicular adenopathy and axillary adenopathy present.     Left upper body: Supraclavicular adenopathy and axillary adenopathy present.     Lower Body: Right inguinal adenopathy present. Left inguinal adenopathy present.  Skin:    Findings: No rash.  Psychiatric:        Mood and Affect: Mood normal.     Lab Results Lab Results  Component Value Date   WBC 4.4 11/11/2018   HGB 8.1 (L) 11/12/2018   HCT 25.7 (L) 11/12/2018   MCV 82.5 11/11/2018   PLT 70 (L) 11/11/2018    Lab Results  Component Value Date   CREATININE 0.90 11/11/2018   BUN 17 11/11/2018   NA 135 11/11/2018   K 3.3 (L) 11/11/2018   CL 101 11/11/2018   CO2 25 11/11/2018    Lab Results  Component Value Date   ALT 62 (H) 11/11/2018   AST 190 (H) 11/11/2018   ALKPHOS 96 11/11/2018   BILITOT 1.9 (H) 11/11/2018     Microbiology: Recent Results (from the past 240 hour(s))  MRSA PCR Screening     Status: None   Collection Time: 11/11/18  4:21 PM  Result Value Ref Range Status   MRSA by PCR NEGATIVE NEGATIVE Final    Comment:        The GeneXpert MRSA Assay (FDA approved for NASAL specimens only), is one component of a comprehensive MRSA colonization surveillance program. It is not intended to diagnose MRSA infection nor to guide or monitor treatment for MRSA infections. Performed at [facility], [place] [address] [place], [place] [address]     Michel Bickers, [place] [facility] [place] [phone] pager   [phone] cell 11/12/2018, 10:44 AM

## 2018-11-12 NOTE — Progress Notes (Signed)
   Subjective/Chief Complaint: No complaints   Objective: Vital signs in last 24 hours: Temp:  [96.2 F (35.7 C)-103 F (39.4 C)] 100.3 F (37.9 C) (02/18 0720) Pulse Rate:  [53-99] 76 (02/18 0800) Resp:  [17-29] 29 (02/18 0800) BP: (82-135)/(40-74) 135/67 (02/18 0800) SpO2:  [92 %-100 %] 100 % (02/18 0800) Weight:  [66.5 kg] 66.5 kg (02/17 1631) Last BM Date: 11/12/18  Intake/Output from previous day: 02/17 0701 - 02/18 0700 In: 3143.6 [P.O.:120; I.V.:1266.9; Blood:422.9; IV Piggyback:1333.8] Out: 325 [Urine:325] Intake/Output this shift: Total I/O In: 257.5 [I.V.:237.6; IV Piggyback:20] Out: -   Exam: Awake and alert Lungs clear Lymphadenopathy left axilla  Lab Results:  Recent Labs    11/11/18 2028 11/12/18 0315  WBC 4.4  --   HGB 6.5* 8.1*  HCT 20.3* 25.7*  PLT 70*  --    BMET Recent Labs    11/11/18 2028  NA 135  K 3.3*  CL 101  CO2 25  GLUCOSE 113*  BUN 17  CREATININE 0.90  CALCIUM 7.5*   PT/INR Recent Labs    11/11/18 2028  LABPROT 21.0*  INR 1.84   ABG No results for input(s): PHART, HCO3 in the last 72 hours.  Invalid input(s): PCO2, PO2  Studies/Results: Dg Chest Port 1 View  Result Date: 11/11/2018 CLINICAL DATA:  Fever EXAM: PORTABLE CHEST 1 VIEW COMPARISON:  None. FINDINGS: Mild cardiomegaly. No focal consolidation or effusion. No pneumothorax. IMPRESSION: No active disease. Electronically Signed   By: Donavan Foil M.D.   On: 11/11/2018 21:59    Anti-infectives: Anti-infectives (From admission, onward)   Start     Dose/Rate Route Frequency Ordered Stop   11/12/18 0800  cefTRIAXone (ROCEPHIN) 2 g in sodium chloride 0.9 % 100 mL IVPB     2 g 200 mL/hr over 30 Minutes Intravenous Every 24 hours 11/11/18 1711        Assessment/Plan: Lymphadenopathy worrisome for lymphoma  Will proceed to the OR today for excisional biopsy of left axillary lymph nodes.  I discussed the procedure with the patient in detail.  I discussed  the risks which include but are not limited to bleeding, infection, injury to surrounding structures, seroma formation, the biopsy being non-diagnostic, cardiopulmonary issues, etc.  She agrees to proceed.  LOS: 1 day    Cindy Ward 11/12/2018

## 2018-11-12 NOTE — Anesthesia Procedure Notes (Signed)
Procedure Name: Intubation Date/Time: 11/12/2018 1:11 PM Performed by: Lissa Morales, CRNA Pre-anesthesia Checklist: Patient identified, Emergency Drugs available, Suction available and Patient being monitored Patient Re-evaluated:Patient Re-evaluated prior to induction Oxygen Delivery Method: Circle system utilized Preoxygenation: Pre-oxygenation with 100% oxygen Induction Type: IV induction Ventilation: Mask ventilation without difficulty Laryngoscope Size: Mac and 3 Grade View: Grade I Tube type: Oral Number of attempts: 1 Airway Equipment and Method: Stylet and Oral airway Placement Confirmation: ETT inserted through vocal cords under direct vision,  positive ETCO2 and breath sounds checked- equal and bilateral Secured at: 21 cm Tube secured with: Tape Dental Injury: Teeth and Oropharynx as per pre-operative assessment

## 2018-11-12 NOTE — Transfer of Care (Signed)
Immediate Anesthesia Transfer of Care Note  Patient: Cindy Ward  Procedure(s) Performed: AXILLARY LYMPH NODE BIOPSY LEFT (Left Axilla)  Patient Location: PACU  Anesthesia Type:General  Level of Consciousness: awake, alert , oriented and patient cooperative  Airway & Oxygen Therapy: Patient Spontanous Breathing and Patient connected to face mask oxygen  Post-op Assessment: Report given to RN, Post -op Vital signs reviewed and stable and Patient moving all extremities X 4  Post vital signs: stable  Last Vitals:  Vitals Value Taken Time  BP 108/66 11/12/2018  2:00 PM  Temp 37.7 C 11/12/2018  1:54 PM  Pulse 70 11/12/2018  2:03 PM  Resp 26 11/12/2018  2:03 PM  SpO2 100 % 11/12/2018  2:03 PM  Vitals shown include unvalidated device data.  Last Pain:  Vitals:   11/12/18 1354  TempSrc:   PainSc: 0-No pain         Complications: No apparent anesthesia complications

## 2018-11-12 NOTE — Op Note (Signed)
   Cindy Ward 11/12/2018   Pre-op Diagnosis: LYMPHADENOPATHY     Post-op Diagnosis: SAME  Procedure(s): EXCISIONAL BIOPSY DEEP LEFT AXILLARY LYMPH NODES  Surgeon(s): Coralie Keens, MD  Anesthesia: General  Staff:  Circulator: Barbee Shropshire, RN Scrub Person: Lorre Nick  Estimated Blood Loss: Minimal               Specimens: nodes sent to path  Procedure: The patient was brought to the operating room and identified as correct patient.  She was placed upon the operating table and general anesthesia was induced.  Her left axilla was then prepped and draped in usual sterile fashion.  I made an incision with a scalpel.  I then took this down into the deep axillary tissue with electrocautery.  The patient had several matted enlarged deep axillary lymph nodes.  I excised several of the lymph nodes together with the cautery.  I then controlled several small bridging vessels with surgical clips.  The lymph nodes were sent fresh to pathology for lymphoma work-up.  Hemostasis appeared to be achieved.  I anesthetized the axilla and skin with Marcaine.  I then closed the subcutaneous tissue with interrupted 3-0 Vicryl sutures and closed the skin with a running 4-0 Monocryl.  Dermabond was then applied.  The patient tolerated the procedure well.  All the counts were correct at the end of the procedure.  The patient was then extubated in the operating room and taken in a stable condition to the recovery room.          Coralie Keens   Date: 11/12/2018  Time: 1:43 PM

## 2018-11-12 NOTE — Progress Notes (Signed)
  Echocardiogram 2D Echocardiogram has been performed.  Cindy Ward 11/12/2018, 12:36 PM

## 2018-11-13 ENCOUNTER — Encounter (HOSPITAL_COMMUNITY): Payer: Self-pay | Admitting: Surgery

## 2018-11-13 LAB — CBC
HCT: 26 % — ABNORMAL LOW (ref 36.0–46.0)
Hemoglobin: 8.3 g/dL — ABNORMAL LOW (ref 12.0–15.0)
MCH: 26.9 pg (ref 26.0–34.0)
MCHC: 31.9 g/dL (ref 30.0–36.0)
MCV: 84.1 fL (ref 80.0–100.0)
PLATELETS: 81 10*3/uL — AB (ref 150–400)
RBC: 3.09 MIL/uL — ABNORMAL LOW (ref 3.87–5.11)
RDW: 18.1 % — AB (ref 11.5–15.5)
WBC: 6.3 10*3/uL (ref 4.0–10.5)
nRBC: 0 % (ref 0.0–0.2)

## 2018-11-13 LAB — COMPREHENSIVE METABOLIC PANEL
ALT: 38 U/L (ref 0–44)
AST: 56 U/L — ABNORMAL HIGH (ref 15–41)
Albumin: 2.1 g/dL — ABNORMAL LOW (ref 3.5–5.0)
Alkaline Phosphatase: 83 U/L (ref 38–126)
Anion gap: 7 (ref 5–15)
BUN: 18 mg/dL (ref 6–20)
CHLORIDE: 103 mmol/L (ref 98–111)
CO2: 25 mmol/L (ref 22–32)
Calcium: 7.9 mg/dL — ABNORMAL LOW (ref 8.9–10.3)
Creatinine, Ser: 0.75 mg/dL (ref 0.44–1.00)
GFR calc Af Amer: >60 mL/min (ref >60)
GFR calc non Af Amer: >60 mL/min (ref >60)
Glucose, Bld: 144 mg/dL — ABNORMAL HIGH (ref 70–99)
POTASSIUM: 3.9 mmol/L (ref 3.5–5.1)
Sodium: 135 mmol/L (ref 135–145)
Total Bilirubin: 1.4 mg/dL — ABNORMAL HIGH (ref 0.3–1.2)
Total Protein: 5.8 g/dL — ABNORMAL LOW (ref 6.5–8.1)

## 2018-11-13 LAB — HAPTOGLOBIN: Haptoglobin: 359 mg/dL — ABNORMAL HIGH (ref 33–346)

## 2018-11-13 MED ORDER — ADULT MULTIVITAMIN W/MINERALS CH
1.0000 | ORAL_TABLET | Freq: Every day | ORAL | Status: DC
  Administered 2018-11-13 – 2018-11-24 (×11): 1 via ORAL
  Filled 2018-11-13 (×12): qty 1

## 2018-11-13 NOTE — Progress Notes (Signed)
MD paged to make aware pt has had temp of >101 twice today. Will give prn tylenol and recheck. Will continue to monitor.

## 2018-11-13 NOTE — Final Consult Note (Signed)
Consultant Final Sign-Off Note    Assessment/Final recommendations  Cindy Ward is a 53 y.o. female followed by me for left axillary lymph node biopsy. Underwent biopsy in OR 11/12/18. Patient reports minimal pain, and no numbness or tingling. Surgical pathology pending.    Wound care (if applicable): Patient may shower/bathe and allow soap and warm water to wash over incision   Diet at discharge: per primary team   Activity at discharge: no lifting > 10 lbs on left side for 2-3 weeks   Follow-up appointment:  PRN with Dr. Ninfa Linden   Pending results:  Unresulted Labs (From admission, onward)   None       Medication recommendations: none   Other recommendations: ice to incision prn for swelling and pain relief    Thank you for allowing [place] to participate in the care of your patient!  Please consult [place] again if you have further needs for your patient.  Kelly Rayburn 11/13/2018 8:45 AM    Subjective   No pain. No paresthesias. Pathology pending.   Objective  Vital signs in last 24 hours: Temp:  [97.6 F (36.4 C)-101.7 F (38.7 C)] 98.7 F (37.1 C) (02/19 0446) Pulse Rate:  [63-86] 86 (02/19 0806) Resp:  [16-34] 26 (02/19 0806) BP: (108-142)/(51-83) 142/57 (02/19 0806) SpO2:  [94 %-100 %] 98 % (02/19 0806) Weight:  [70.1 kg] 70.1 kg (02/19 0500)  Physical Exam  Constitutional: She is oriented to person, place, and time. She appears well-developed and well-nourished. No distress.  HENT:  Head: Normocephalic and atraumatic.  Right Ear: External ear normal.  Left Ear: External ear normal.  Nose: Nose normal.  Mouth/Throat: Oropharynx is clear and moist.  Eyes: Conjunctivae and EOM are normal. Scleral icterus is present.  Neck: Normal range of motion. Neck supple.  Cardiovascular: Normal rate, regular rhythm and intact distal pulses.  Respiratory: Effort normal and breath sounds normal.  Left axillary incision c/d/i with no edema  GI: Soft. She exhibits no  distension. There is no abdominal tenderness.  Musculoskeletal: Normal range of motion.  Neurological: She is alert and oriented to person, place, and time.  Skin: Skin is warm. She is diaphoretic.     Pertinent labs and Studies: Recent Labs    11/11/18 12/24/26 11/12/18 0315 11/13/18 0252  WBC 4.4  --  6.3  HGB 6.5* 8.1* 8.3*  HCT 20.3* 25.7* 26.0*   BMET Recent Labs    11/11/18 December 24, 2026 11/13/18 0252  NA 135 135  K 3.3* 3.9  CL 101 103  CO2 25 25  GLUCOSE 113* 144*  BUN 17 18  CREATININE 0.90 0.75  CALCIUM 7.5* 7.9*   No results for input(s): LABURIN in the last 72 hours. Results for orders placed or performed during the hospital encounter of 11/11/18  MRSA PCR Screening     Status: None   Collection Time: 11/11/18  4:21 PM  Result Value Ref Range Status   MRSA by PCR NEGATIVE NEGATIVE Final    Comment:        The GeneXpert MRSA Assay (FDA approved for NASAL specimens only), is one component of a comprehensive MRSA colonization surveillance program. It is not intended to diagnose MRSA infection nor to guide or monitor treatment for MRSA infections. Performed at [facility], [place] [address] [place], [place] [address]     Imaging: No results found.

## 2018-11-13 NOTE — Progress Notes (Addendum)
PROGRESS NOTE  Cindy Ward YHC:[id number] DOB: 1966-07-15 DOA: 11/11/2018 PCP: [name], MD  HPI/Recap of past 24 hours: 53 y.o.femalewith medical history significant ofhypertension hypothyroidism presents at [facility] with recurrent nausea, vomiting, fevers, and night sweats of 9 months duration.She notes that the symptoms occur about every 2 weeks. She has nightly fevers and night sweats. The symptoms occur for days at a time, but have been taking longer and longer to resolve.She came to the hospital today because she passed out at the cancer center. She describes getting dizzy and then hitting the floor.  She was seen in the emergency department at [facility]. Her vitals were notable for a fever to 102.7. Multiple enlarged lymph nodes noted on CT abdomen and pelvis. The case was discussed with [name] who recommended transfer to [facility] for further workup as well as LDH, haptoglobin, and retics.  11/13/2018: Patient seen and examined at bedside.  No acute events overnight.  She had a biopsy done on 11/12/2018.  Awaiting results of pathology.  Assessment/Plan: Principal Problem:   FUO (fever of unknown origin) Active Problems:   Lymphadenopathy   Splenomegaly   Liver enzyme elevation   Thrombocytopenia (HCC)   Anemia   Unintentional weight loss   Hashimoto's thyroiditis   Goiter   Hypertension   Aortic atherosclerosis (HCC)   Lymphadenopathy with concern for lymphoma Biopsies done on 11/12/2018 by general surgery Pathology pending, follow T-max 100 overnight No specific complaints this morning  Splenomegaly suspect secondary to #1 Platelet 80 1K No sign of overt bleeding  Fever of unknown origin  Infectious disease consulted and following No need for antibiotics at this time Suspected lymphoma  Hyperbilirubinemia Total bilirubin 1.4 Alkaline phosphatase ALT and AST normal Continue to closely monitor  Chronic normocytic  anemia Hemoglobin is stable at 8.3 from 8.1 yesterday No sign of overt bleeding Obtain CBC in the morning with differential  ?  Syncope Suspected secondary to anemia versus dehydration Continue hydration as tolerated  Hypothyroidism/thyroid goiter Continue Synthroid  Hypertension Blood pressure stable Continue Bystolic  Moderate protein calorie malnutrition C/w oral supplement  Dietitian is following   DVT prophylaxis: SCD's Code Status: Full Family Communication: Pt in room, family not at bedside Disposition Plan: Uncertain at this time  Consultants:   General Surgery  ID  Procedures:  Lymph node biopsies done on 11/12/2018   Objective: Vitals:   11/13/18 0400 11/13/18 0446 11/13/18 0500 11/13/18 0806  BP: (!) 108/51   (!) 142/57  Pulse: 80   86  Resp: (!) 28   (!) 26  Temp:  98.7 F (37.1 C)    TempSrc:  Oral    SpO2: 96%   98%  Weight:   70.1 kg   Height:        Intake/Output Summary (Last 24 hours) at 11/13/2018 0908 Last data filed at 11/13/2018 0800 Gross per 24 hour  Intake 2088.85 ml  Output 850 ml  Net 1238.85 ml   Filed Weights   11/11/18 1631 11/13/18 0500  Weight: 66.5 kg 70.1 kg    Exam:  . General: 53 y.o. year-old female well developed well nourished in no acute distress.  Alert and oriented x3. . Cardiovascular: Regular rate and rhythm with no rubs or gallops.  No thyromegaly or JVD noted.   Marland Kitchen Respiratory: Clear to auscultation with no wheezes or rales. Good inspiratory effort. . Abdomen: Soft nontender nondistended with normal bowel sounds x4 quadrants. . Musculoskeletal: No lower extremity edema. 2/4 pulses  in all 4 extremities. Marland Kitchen Psychiatry: Mood is appropriate for condition and setting   Data Reviewed: CBC: Recent Labs  Lab 11/11/18 2028 11/12/18 0315 11/13/18 0252  WBC 4.4  --  6.3  NEUTROABS 3.2  --   --   HGB 6.5* 8.1* 8.3*  HCT 20.3* 25.7* 26.0*  MCV 82.5  --  84.1  PLT 70*  --  81*   Basic Metabolic  Panel: Recent Labs  Lab 11/11/18 2028 11/13/18 0252  NA 135 135  K 3.3* 3.9  CL 101 103  CO2 25 25  GLUCOSE 113* 144*  BUN 17 18  CREATININE 0.90 0.75  CALCIUM 7.5* 7.9*   GFR: Estimated Creatinine Clearance: 82 mL/min (by C-G formula based on SCr of 0.75 mg/dL). Liver Function Tests: Recent Labs  Lab 11/11/18 2028 11/13/18 0252  AST 190* 56*  ALT 62* 38  ALKPHOS 96 83  BILITOT 1.9* 1.4*  PROT 6.1* 5.8*  ALBUMIN 2.3* 2.1*   No results for input(s): LIPASE, AMYLASE in the last 168 hours. No results for input(s): AMMONIA in the last 168 hours. Coagulation Profile: Recent Labs  Lab 11/11/18 2028  INR 1.84   Cardiac Enzymes: No results for input(s): CKTOTAL, CKMB, CKMBINDEX, TROPONINI in the last 168 hours. BNP (last 3 results) No results for input(s): PROBNP in the last 8760 hours. HbA1C: No results for input(s): HGBA1C in the last 72 hours. CBG: Recent Labs  Lab 11/11/18 1641  GLUCAP 110*   Lipid Profile: No results for input(s): CHOL, HDL, LDLCALC, TRIG, CHOLHDL, LDLDIRECT in the last 72 hours. Thyroid Function Tests: Recent Labs    11/11/18 2122  TSH 0.406  FREET4 0.94   Anemia Panel: Recent Labs    11/11/18 2028  RETICCTPCT 1.2   Urine analysis: No results found for: COLORURINE, APPEARANCEUR, LABSPEC, PHURINE, GLUCOSEU, HGBUR, BILIRUBINUR, KETONESUR, PROTEINUR, UROBILINOGEN, NITRITE, LEUKOCYTESUR Sepsis Labs: @LABRCNTIP (procalcitonin:4,lacticidven:4)  ) Recent Results (from the past 240 hour(s))  MRSA PCR Screening     Status: None   Collection Time: 11/11/18  4:21 PM  Result Value Ref Range Status   MRSA by PCR NEGATIVE NEGATIVE Final    Comment:        The GeneXpert MRSA Assay (FDA approved for NASAL specimens only), is one component of a comprehensive MRSA colonization surveillance program. It is not intended to diagnose MRSA infection nor to guide or monitor treatment for MRSA infections. Performed at [facility], [place] [address] [place], [place] [address]       Studies: No results found.  Scheduled Meds: . sodium chloride   Intravenous Once  . feeding supplement  1 Container Oral TID BM  . levothyroxine  75 mcg Oral Q0600  . mouth rinse  15 mL Mouth Rinse BID  . nebivolol  10 mg Oral Daily  . scopolamine  1 patch Transdermal Q72H    Continuous Infusions:   LOS: 2 days     Kayleen Memos, MD [facility] Pager [phone]  If 7PM-7AM, please contact night-coverage [email or web address] Password [email or web address] 11/13/2018, 9:08 AM

## 2018-11-13 NOTE — Progress Notes (Signed)
Initial Nutrition Assessment  DOCUMENTATION CODES:   Non-severe (moderate) malnutrition in context of chronic illness  INTERVENTION:  - Continue Boost Breeze TID, each supplement provides 250 kcal and 9 grams of protein. - Will order Magic Cup BID with meals, each supplement provides 290 kcal and 9 grams of protein. - Will order daily multivitamin with minerals.  - Continue to encourage PO intakes.    NUTRITION DIAGNOSIS:   Moderate Malnutrition related to chronic illness, nausea, vomiting, other (see comment)(night sweats) as evidenced by mild fat depletion, mild muscle depletion, moderate muscle depletion.  GOAL:   Patient will meet greater than or equal to 90% of their needs  MONITOR:   PO intake, Supplement acceptance, Weight trends, Labs, I & O's  REASON FOR ASSESSMENT:   Malnutrition Screening Tool  ASSESSMENT:   53 y.o. female with medical history significant of HTN and hypothyroidism. She presented at [facility] with recurrent N/V, fevers, and night sweats of 9 months duration. She notes that the symptoms occur about every 2 weeks. She has nightly fevers and night sweats.  The symptoms occur for days at a time, but have been taking longer and longer to resolve. She presented to the ED after getting dizzy and passing out at the [facility]. She was noted to have a fever of 102.7. Multiple enlarged lymph nodes noted on CT abdomen and pelvis.  BMI indicates normal weight. She is POD #1 excisional biopsy of L axillary LNs. No intakes documented since admission. Patient reports being able to keep down a few bites of banana, a piece of toast, and a few bites of cereal for breakfast. She states that this is the first solid food she has had in >1 week. She reports that about 9 months ago she began having periods of illness that began with a cough or chills and progressed to include N/V and inability to keep down much more than water. These periods were happening every 2  weeks and would last for ~5 days but more recently they have been lasting for ~9 days. During those periods she is only able to keep down water although she tries to consume Pedialyte (does not stay down). She does not try solid foods during those periods because in the past it has been unsuccessful.  Talked with patient about ONS; she has tried the boost breeze and feels it is too heavy so encouraged her to try diluting it with water. She asks about foods to help in maintaining and/or raising BP. Talked about some options for this which she may be able to tolerate given what she had for breakfast this AM, but patient does not seem confident that she will be able to tolerate any items.   NFPE outlined below. Patient reports UBW of 200 lb and that she last weighed this 9 months ago. She reports that weight was previously stable for a long time and weight loss only began when these episodes began. She states that weight PTA was 147 lb. Current weight is 154 lb and weight on 05/14/18 was 175 lb. This indicates 21 lb weight loss (12% body weight) in the past 6 months; significant for time frame.    Medications reviewed; 75 mcg oral synthroid/day. Labs reviewed; Ca: 7.9 mg/dl.       NUTRITION - FOCUSED PHYSICAL EXAM:    Most Recent Value  Orbital Region  Mild depletion  Upper Arm Region  Mild depletion  Thoracic and Lumbar Region  Unable to assess  Buccal Region  Moderate depletion  Temple Region  Moderate depletion  Clavicle Bone Region  Mild depletion  Clavicle and Acromion Bone Region  Moderate depletion  Scapular Bone Region  Unable to assess  Dorsal Hand  No depletion  Patellar Region  No depletion  Anterior Thigh Region  Unable to assess  Posterior Calf Region  Mild depletion  Edema (RD Assessment)  None  Hair  Reviewed  Eyes  Reviewed  Mouth  Reviewed  Skin  Reviewed  Nails  Reviewed       Diet Order:   Diet Order            Diet regular Room service appropriate? Yes; Fluid  consistency: Thin  Diet effective now              EDUCATION NEEDS:   Education needs have been addressed  Skin:  Skin Assessment: Skin Integrity Issues: Skin Integrity Issues:: Incisions Incisions: L axilla (2/18)  Last BM:  2/18  Height:   Ht Readings from Last 1 Encounters:  11/11/18 5\' 8"  (1.727 m)    Weight:   Wt Readings from Last 1 Encounters:  11/13/18 70.1 kg    Ideal Body Weight:  63.64 kg  BMI:  Body mass index is 23.48 kg/m.  Estimated Nutritional Needs:   Kcal:  2100-2300 kcal  Protein:  100-115 grams  Fluid:  >/= 2.1 L/day     Jarome Matin, MS, RD, LDN, [facility] Inpatient Clinical Dietitian Pager # [phone] After hours/weekend pager # [phone]

## 2018-11-14 ENCOUNTER — Encounter (HOSPITAL_COMMUNITY): Payer: Self-pay | Admitting: Oncology

## 2018-11-14 DIAGNOSIS — D61818 Other pancytopenia: Secondary | ICD-10-CM | POA: Diagnosis present

## 2018-11-14 LAB — URINALYSIS, ROUTINE W REFLEX MICROSCOPIC
Bilirubin Urine: NEGATIVE
GLUCOSE, UA: NEGATIVE mg/dL
Ketones, ur: NEGATIVE mg/dL
Leukocytes,Ua: NEGATIVE
Nitrite: NEGATIVE
Protein, ur: 30 mg/dL — AB
Specific Gravity, Urine: 1.016 (ref 1.005–1.030)
pH: 5 (ref 5.0–8.0)

## 2018-11-14 LAB — SAVE SMEAR(SSMR), FOR PROVIDER SLIDE REVIEW

## 2018-11-14 LAB — FERRITIN: Ferritin: 5917 ng/mL — ABNORMAL HIGH (ref 11–307)

## 2018-11-14 LAB — CBC WITH DIFFERENTIAL/PLATELET
Abs Immature Granulocytes: 0.09 10*3/uL — ABNORMAL HIGH (ref 0.00–0.07)
Basophils Absolute: 0 10*3/uL (ref 0.0–0.1)
Basophils Relative: 0 %
Eosinophils Absolute: 0 10*3/uL (ref 0.0–0.5)
Eosinophils Relative: 0 %
HCT: 20.9 % — ABNORMAL LOW (ref 36.0–46.0)
Hemoglobin: 6.7 g/dL — CL (ref 12.0–15.0)
Immature Granulocytes: 2 %
Lymphocytes Relative: 9 %
Lymphs Abs: 0.3 10*3/uL — ABNORMAL LOW (ref 0.7–4.0)
MCH: 27 pg (ref 26.0–34.0)
MCHC: 32.1 g/dL (ref 30.0–36.0)
MCV: 84.3 fL (ref 80.0–100.0)
Monocytes Absolute: 0.5 10*3/uL (ref 0.1–1.0)
Monocytes Relative: 13 %
NEUTROS ABS: 2.8 10*3/uL (ref 1.7–7.7)
Neutrophils Relative %: 76 %
Platelets: 73 10*3/uL — ABNORMAL LOW (ref 150–400)
RBC: 2.48 MIL/uL — ABNORMAL LOW (ref 3.87–5.11)
RDW: 18.3 % — ABNORMAL HIGH (ref 11.5–15.5)
WBC: 3.8 10*3/uL — ABNORMAL LOW (ref 4.0–10.5)
nRBC: 0 % (ref 0.0–0.2)

## 2018-11-14 LAB — VITAMIN B12: Vitamin B-12: 810 pg/mL (ref 180–914)

## 2018-11-14 LAB — PREPARE RBC (CROSSMATCH)

## 2018-11-14 LAB — RETICULOCYTES
Immature Retic Fract: 13.2 % (ref 2.3–15.9)
RBC.: 2.47 MIL/uL — ABNORMAL LOW (ref 3.87–5.11)
RETIC COUNT ABSOLUTE: 19.8 10*3/uL (ref 19.0–186.0)
Retic Ct Pct: 0.8 % (ref 0.4–3.1)

## 2018-11-14 LAB — IRON AND TIBC
Iron: 17 ug/dL — ABNORMAL LOW (ref 28–170)
Saturation Ratios: 15 % (ref 10.4–31.8)
TIBC: 111 ug/dL — ABNORMAL LOW (ref 250–450)
UIBC: 94 ug/dL

## 2018-11-14 LAB — HEMOGLOBIN AND HEMATOCRIT, BLOOD
HCT: 24.4 % — ABNORMAL LOW (ref 36.0–46.0)
Hemoglobin: 7.9 g/dL — ABNORMAL LOW (ref 12.0–15.0)

## 2018-11-14 MED ORDER — SODIUM CHLORIDE 0.9% IV SOLUTION
Freq: Once | INTRAVENOUS | Status: AC
  Administered 2018-11-14: 05:00:00 via INTRAVENOUS

## 2018-11-14 NOTE — Consult Note (Signed)
[place]  [phone] Fax:([phone]) [phone]    [place]  Referring MD: Dr. Irene Pap   Reason for Referral/Chief Complaint: Pancytopenia, lymphadenopathy   HPI: Cindy Ward is a 53 y.o.femalefrom Randelman with medical history significant ofhypertension and hypothyroidism who presented to the [facility] emergency room after going to the cancer center in [place] and passing out.  She was found to be febrile with a temperature of 102.7 in the emergency room at [facility] and also had multiple enlarged lymph nodes noted on her CT of the abdomen and pelvis.  Her primary hematologist, [name],  was contacted and advised transfer to [facility] for further work-up including an LDH, haptoglobin, and reticulocytes.  Today the patient tells me that she is been seen by [name] since about September 2019.  She was initially referred to her for enlarged lymph nodes concerning for cancer.  According to the patient, a thorough work-up at [facility] has been unrevealing.  She had a bone marrow biopsy performed in approximately November 2019 which she reports is negative.  Records have been received and reviewed.  A CT scan of the chest, abdomen, pelvis was performed on 11/11/2018 which showed worsening adenopathy in the bilateral axillary and subpectoral regions, left infrahilar region, and upper abdomen/retroperitoneum compatible with lymphoma.  There was also notable splenomegaly.  She has a small to moderate sized pericardial effusion.  The patient had a bone marrow biopsy performed on 07/02/2018 which showed slightly hypercellular for age with trilineage hematopoiesis and nonspecific changes.  There was no morphologic or phenotypic evidence of a lymphoproliferative process in the sample.  Cytogenetics were normal.  Of note, labs performed on the day of her bone marrow biopsy showed a white blood cell count of 7.2, hemoglobin of 10.3,  hematocrit 31.8%, and platelet count of 172,000.  The patient tells me that she has been having fatigue, fevers, and weight loss of approximately 60 pounds over the past 9 months.  She also reported having night sweats which stopped about 1 month ago.  She has not been able to palpate any of her enlarged lymph nodes.  She also notices an increased nonproductive cough.  She has also been having vomiting.    The patient underwent an excisional left axillary lymph node biopsy on 11/12/2018.  Results are still pending.  Hematology/oncology was asked to see the patient for evaluation and recommendations regarding her pancytopenia and lymphadenopathy.   Past Medical History:  Diagnosis Date  . Hypertension   . Thyroid disease      Past Surgical History:  Procedure Laterality Date  . AXILLARY LYMPH NODE BIOPSY Left 11/12/2018   Procedure: AXILLARY LYMPH NODE BIOPSY LEFT;  Surgeon: Coralie Keens, MD;  Location: [facility] ORS;  Service: General;  Laterality: Left;  :   CURRENT MEDS: Current Facility-Administered Medications  Medication Dose Route Frequency Provider Last Rate Last Dose  . 0.9 %  sodium chloride infusion (Manually program via Guardrails IV Fluids)   Intravenous Once Earnstine Regal, PA-C      . acetaminophen (TYLENOL) tablet 650 mg  650 mg Oral Q6H PRN Earnstine Regal, PA-C   650 mg at 11/14/18 5038   Or  . acetaminophen (TYLENOL) suppository 650 mg  650 mg Rectal Q6H PRN Earnstine Regal, PA-C      . feeding supplement (BOOST / RESOURCE BREEZE) liquid 1 Container  1 Container Oral TID BM Earnstine Regal, PA-C   1 Container at 11/13/18 2003  . HYDROmorphone (DILAUDID)  injection 0.5-1 mg  0.5-1 mg Intravenous Q4H PRN Earnstine Regal, PA-C      . levothyroxine (SYNTHROID, LEVOTHROID) tablet 75 mcg  75 mcg Oral Q0600 Earnstine Regal, PA-C   75 mcg at 11/14/18 0626  . MEDLINE mouth rinse  15 mL Mouth Rinse BID Earnstine Regal, PA-C   15 mL at 11/14/18 1014  . multivitamin  with minerals tablet 1 tablet  1 tablet Oral Daily Irene Pap N, DO   1 tablet at 11/14/18 1014  . nebivolol (BYSTOLIC) tablet 10 mg  10 mg Oral Daily Earnstine Regal, PA-C   10 mg at 11/14/18 1013  . ondansetron (ZOFRAN) tablet 4 mg  4 mg Oral Q6H PRN Earnstine Regal, PA-C       Or  . ondansetron [facility]) injection 4 mg  4 mg Intravenous Q6H PRN Earnstine Regal, PA-C   4 mg at 11/12/18 9485  . oxyCODONE (Oxy IR/ROXICODONE) immediate release tablet 5-10 mg  5-10 mg Oral Q4H PRN Earnstine Regal, PA-C      . polyethylene glycol (MIRALAX / GLYCOLAX) packet 17 g  17 g Oral Daily PRN Earnstine Regal, PA-C      . scopolamine (TRANSDERM-SCOP) 1 MG/3DAYS 1.5 mg  1 patch Transdermal Q72H Earnstine Regal, PA-C          No Known Allergies:  History reviewed. No pertinent family history.:  Social History   Socioeconomic History  . Marital status: Divorced    Spouse name: Not on file  . Number of children: Not on file  . Years of education: Not on file  . Highest education level: Not on file  Occupational History  . Not on file  Social Needs  . Financial resource strain: Not on file  . Food insecurity:    Worry: Not on file    Inability: Not on file  . Transportation needs:    Medical: Not on file    Non-medical: Not on file  Tobacco Use  . Smoking status: Never Smoker  . Smokeless tobacco: Never Used  Substance and Sexual Activity  . Alcohol use: Never    Frequency: Never  . Drug use: Never  . Sexual activity: Not on file  Lifestyle  . Physical activity:    Days per week: Not on file    Minutes per session: Not on file  . Stress: Not on file  Relationships  . Social connections:    Talks on phone: Not on file    Gets together: Not on file    Attends religious service: Not on file    Active member of club or organization: Not on file    Attends meetings of clubs or organizations: Not on file    Relationship status: Not on file  . Intimate partner violence:     Fear of current or ex partner: Not on file    Emotionally abused: Not on file    Physically abused: Not on file    Forced sexual activity: Not on file  Other Topics Concern  . Not on file  Social History Narrative  . Not on file  :  REVIEW OF SYSTEMS:   Constitutional: Reports decreased appetite, weight loss, fatigue, fever, and chills. HENT:   Negative for mouth sores, nosebleeds, sore throat and trouble swallowing.   Eyes: Negative for eye problems and icterus.  Respiratory: Reports nonproductive cough.  Denies shortness of breath, wheezing, hemoptysis. Cardiovascular: Negative for chest pain and leg swelling.  Gastrointestinal: Negative for abdominal pain, constipation, diarrhea, nausea and  vomiting.  Genitourinary: Negative for bladder incontinence, difficulty urinating, dysuria, frequency and hematuria.   Musculoskeletal: Negative for back pain, gait problem, neck pain and neck stiffness.  Skin: Negative for itching and rash.  Neurological: Had an episode of dizziness prior to passing out recently.  Denies headaches and visual changes. Hematological: The patient has been told that she has enlarged lymph nodes which she cannot feel these.  Does not bruise/bleed easily.  Psychiatric/Behavioral: Negative for confusion, depression and sleep disturbance. The patient is not nervous/anxious.    Exam: Patient Vitals for the past 24 hrs:  BP Temp Temp src Pulse Resp SpO2 Weight  11/14/18 1008 (!) 115/54 98.4 F (36.9 C) Oral 81 17 100 % -  11/14/18 0802 (!) 142/63 100 F (37.8 C) Oral 94 (!) 32 100 % -  11/14/18 0800 (!) 142/63 - - 85 (!) 29 96 % -  11/14/18 0730 - (!) 103.1 F (39.5 C) Oral - - - -  11/14/18 0631 - 99.1 F (37.3 C) Oral 99 (!) 23 100 % -  11/14/18 0630 (!) 157/74 - - 87 (!) 24 96 % -  11/14/18 0504 126/62 98.5 F (36.9 C) Oral 67 (!) 25 99 % -  11/14/18 0500 - - - - - - 159 lb 8 oz (72.3 kg)  11/14/18 0449 118/60 98.2 F (36.8 C) Oral 75 17 100 % -  11/14/18  0400 (!) 106/52 97.8 F (36.6 C) Oral 62 (!) 23 98 % -  11/14/18 0148 - - - 71 (!) 22 97 % -  11/14/18 0100 - - - 82 (!) 29 100 % -  11/14/18 0015 - (!) 103 F (39.4 C) Oral - - - -  11/14/18 0000 (!) 123/48 - - 87 20 97 % -  11/13/18 2000 (!) 106/52 98.7 F (37.1 C) Oral 69 (!) 25 98 % -  11/13/18 1900 - - - 72 - 97 % -  11/13/18 1630 - 99.8 F (37.7 C) Oral - - - -  11/13/18 1600 (!) 119/56 - - 85 - 98 % -  11/13/18 1536 - (!) 101.3 F (38.5 C) Oral - - - -  11/13/18 1200 (!) 105/53 98.1 F (36.7 C) Oral 66 (!) 21 98 % -    General: Tired appearing female who is in no acute distress.  Eyes:  no scleral icterus.  ENT:  There were no oropharyngeal lesions.  Neck was without thyromegaly.  Lymphatics: Adenopathy noted in the bilateral supraclavicular, axillary, cervical, and inguinal areas.  Respiratory: lungs were clear bilaterally without wheezing or crackles.  Cardiovascular:  Regular rate and rhythm, S1/S2, without murmur, rub or gallop.  There was no pedal edema.  GI:  abdomen was soft, flat, nontender, nondistended, spleen tip palpable.  Musculoskeletal:  no spinal tenderness of palpation of vertebral spine.  Skin exam was without ecchymosis, petechiae.  Neuro exam was nonfocal.  Patient was alerted and oriented.  Attention was good.   Language was appropriate.  Mood was normal without depression.  Speech was not pressured.  Thought content was not tangential.    LABS:  Lab Results  Component Value Date   WBC 3.8 (L) 11/14/2018   HGB 7.9 (L) 11/14/2018   HCT 24.4 (L) 11/14/2018   PLT 73 (L) 11/14/2018   GLUCOSE 144 (H) 11/13/2018   ALT 38 11/13/2018   AST 56 (H) 11/13/2018   NA 135 11/13/2018   K 3.9 11/13/2018   CL 103 11/13/2018   CREATININE 0.75 11/13/2018  BUN 18 11/13/2018   CO2 25 11/13/2018   INR 1.84 11/11/2018    Dg Chest Port 1 View  Result Date: 11/11/2018 CLINICAL DATA:  Fever EXAM: PORTABLE CHEST 1 VIEW COMPARISON:  None. FINDINGS: Mild cardiomegaly. No  focal consolidation or effusion. No pneumothorax. IMPRESSION: No active disease. Electronically Signed   By: Donavan Foil M.D.   On: 11/11/2018 21:59   ASSESSMENT AND PLAN:   This is a pleasant 53 year old female with  1.  Pancytopenia: Unclear etiology.  Labs from [facility] were reviewed in October 2019.  The patient has had progressive pancytopenia.  Today, the patient's hemoglobin improved to 7.9 from 6.7 after 1 unit of packed red blood cells.  Consider transfusing packed red blood cells if her hemoglobin is 7.0 or less.  Platelets are low at 73,000.  She has no active bleeding.  Transfuse if bleeding or platelets drop below 10,000.  The patient has mild leukopenia. Further work-up is pending including iron studies, stool for occult blood, vitamin B12 and folate levels.  LDH is normal.  Reticulocytes are not elevated.  Haptoglobin is mildly elevated at 359.  We had a lengthy discussion with the patient today regarding her very thorough work-up from [facility] and need for additional information.  We discussed with the patient that she could have a lymphoma versus uncommon condition such as D'Lo or Castleman's disease.  Recommend that she undergo a repeat bone marrow biopsy since she is developed worsening cytopenias since her last one.  Also recommend additional lab work which has been ordered including a serum protein electrophoresis with quantitative immunoglobulins, light chains, HHV 8, CMV, EBV, QuantiFERON, rheumatoid factor, beta-2 microglobulin, VEGF, IL-6, anti-DNA antibody-double-stranded, hepatitis B surface antibody, hepatitis B core antibody, but his B antigen, and hepatitis C antibody.  Will await this work-up for further recommendations.    2.  Lymphadenopathy: The patient has diffuse lymphadenopathy.  An excisional axillary lymph node biopsy has been obtained and results are pending.  The patient has symptoms suggestive of a myeloproliferative disorder including weight loss,  fatigue, night sweats, and fevers.  Await pathology.  Thank you for this referral.  Mikey Bussing, DNP, AGPCNP-BC, AOCNP

## 2018-11-14 NOTE — Progress Notes (Signed)
Patient ID: Cindy Ward, female   DOB: 26-Jan-1966, 53 y.o.   MRN: [id number]          [facility] for Infectious Disease    Date of Admission:  11/11/2018     She remains febrile but there is no convincing evidence of active infection.  The pathology report on her axillary lymph node biopsy remains pending.  I will continue observation off of antibiotics for now.         Michel Bickers, MD [facility] for [facility] [phone] pager   [phone] cell 11/14/2018, 2:59 PM

## 2018-11-14 NOTE — Progress Notes (Signed)
Patient had received an order for administration of one unit of PRBC's. Patient had active type and cross completed on 11/11/18 and consent signed on 11/11/18. Reviewed signs/symptoms of adverse reaction with patient and understanding verbalized. Unit verified with patient identifiers by 2nd nurse witness. Unit initiated at 0449. Remained with patient for the first fifteen minutes of the transfusion. No signs/symptoms of adverse reaction and no significant change in vital signs.

## 2018-11-14 NOTE — Progress Notes (Signed)
PROGRESS NOTE  Arly Salminen OYD:[id number] DOB: [birth year] DOA: 11/11/2018 PCP: [name], MD  HPI/Recap of past 24 hours: 53 y.o.femalewith medical history significant ofhypertension hypothyroidism presents at [facility] with recurrent nausea, vomiting, fevers, and night sweats of 9 months duration.She notes that the symptoms occur about every 2 weeks. She has nightly fevers and night sweats. The symptoms occur for days at a time, but have been taking longer and longer to resolve.She came to the hospital today because she passed out at the cancer center. She describes getting dizzy and then hitting the floor.  She was seen in the emergency department at [facility]. Her vitals were notable for a fever to 102.7. Multiple enlarged lymph nodes noted on CT abdomen and pelvis. The case was discussed with [name] who recommended transfer to [facility] for further workup as well as LDH, haptoglobin, and retics.  11/14/18: Patient seen and examined at bedside.  No acute events overnight.  Lymph node biopsy done on 11/12/2018.  Awaiting results of pathology.    Assessment/Plan: Principal Problem:   FUO (fever of unknown origin) Active Problems:   Lymphadenopathy   Splenomegaly   Liver enzyme elevation   Thrombocytopenia (HCC)   Anemia   Unintentional weight loss   Hashimoto's thyroiditis   Goiter   Hypertension   Aortic atherosclerosis (HCC)   Lymphadenopathy with concern for lymphoma Lymph node biopsies done on 11/12/2018 by general surgery Pathology pending, follow Persistent fevers Blood cultures x2- to date Chest x-ray unremarkable UA and culture pending  Splenomegaly suspect secondary to #1 Platelet drop 73K from 81K No sign of overt bleeding No tenderness on abdominal exam  Worsening pancytopenia WBC 3.8 from 6.3 yesterday Hemoglobin 6.7 from 8.3 Platelet 73 from 81 Obtain peripheral smear Hematology oncology Dr Maylon Peppers will see the patient in  consultation  Persistent fever of unknown origin  Infectious disease consulted and following No need for antibiotics at this time Suspected lymphoma No sign of active infective process at this time  Hyperbilirubinemia Total bilirubin 1.4 Alkaline phosphatase ALT and AST normal Continue to closely monitor  Chronic normocytic anemia Drop in hemoglobin We will transfuse 1 unit PRBC Repeat H&H in the morning  Small pericardial effusion  2D echo 11/12/18 No evidence of cardiac tamponade  ?  Syncope Suspected secondary to anemia versus dehydration Continue hydration as tolerated  Hypothyroidism/thyroid goiter Continue Synthroid  Hypertension Blood pressure stable Continue Bystolic  Moderate protein calorie malnutrition C/w oral supplement  Dietitian is following   DVT prophylaxis: SCD's Code Status: Full Family Communication:  Updated daughter Anderson Malta on the phone on 11/14/2018 Disposition Plan:  Home when hemodynamically stable or when heme oncology signs off.  Consultants:   General Surgery  ID  Procedures:  Lymph node biopsies done on 11/12/2018   Objective: Vitals:   11/14/18 0800 11/14/18 0802 11/14/18 1008 11/14/18 1200  BP: (!) 142/63 (!) 142/63 (!) 115/54 (!) 98/46  Pulse: 85 94 81 66  Resp: (!) 29 (!) 32 17 (!) 25  Temp:  100 F (37.8 C) 98.4 F (36.9 C) 98.2 F (36.8 C)  TempSrc:  Oral Oral Oral  SpO2: 96% 100% 100% 98%  Weight:      Height:        Intake/Output Summary (Last 24 hours) at 11/14/2018 1345 Last data filed at 11/14/2018 0802 Gross per 24 hour  Intake 622.08 ml  Output 425 ml  Net 197.08 ml   Filed Weights   11/11/18 1631 11/13/18 0500 11/14/18 0500  Weight: 66.5 kg 70.1 kg 72.3 kg    Exam:  . General: 53 y.o. year-old female well-developed well-nourished no acute distress.  Alert and oriented x3. . Cardiovascular: Regular rate and rhythm with no rubs or gallops.  No JVD or thyromegaly . Respiratory: Clear to  auscultation with no wheezes or rales.  Good inspiratory effort. . Abdomen: Soft nontender nondistended with normal bowel sounds x4 quadrants. . Musculoskeletal: No lower extremity edema. 2/4 pulses in all 4 extremities. Marland Kitchen Psychiatry: Mood is appropriate for condition and setting   Data Reviewed: CBC: Recent Labs  Lab 11/11/18 2028 11/12/18 0315 11/13/18 0252 11/14/18 0308 11/14/18 1009  WBC 4.4  --  6.3 3.8*  --   NEUTROABS 3.2  --   --  2.8  --   HGB 6.5* 8.1* 8.3* 6.7* 7.9*  HCT 20.3* 25.7* 26.0* 20.9* 24.4*  MCV 82.5  --  84.1 84.3  --   PLT 70*  --  81* 73*  --    Basic Metabolic Panel: Recent Labs  Lab 11/11/18 2028 11/13/18 0252  NA 135 135  K 3.3* 3.9  CL 101 103  CO2 25 25  GLUCOSE 113* 144*  BUN 17 18  CREATININE 0.90 0.75  CALCIUM 7.5* 7.9*   GFR: Estimated Creatinine Clearance: 82 mL/min (by C-G formula based on SCr of 0.75 mg/dL). Liver Function Tests: Recent Labs  Lab 11/11/18 2028 11/13/18 0252  AST 190* 56*  ALT 62* 38  ALKPHOS 96 83  BILITOT 1.9* 1.4*  PROT 6.1* 5.8*  ALBUMIN 2.3* 2.1*   No results for input(s): LIPASE, AMYLASE in the last 168 hours. No results for input(s): AMMONIA in the last 168 hours. Coagulation Profile: Recent Labs  Lab 11/11/18 2028  INR 1.84   Cardiac Enzymes: No results for input(s): CKTOTAL, CKMB, CKMBINDEX, TROPONINI in the last 168 hours. BNP (last 3 results) No results for input(s): PROBNP in the last 8760 hours. HbA1C: No results for input(s): HGBA1C in the last 72 hours. CBG: Recent Labs  Lab 11/11/18 1641  GLUCAP 110*   Lipid Profile: No results for input(s): CHOL, HDL, LDLCALC, TRIG, CHOLHDL, LDLDIRECT in the last 72 hours. Thyroid Function Tests: Recent Labs    11/11/18 2122  TSH 0.406  FREET4 0.94   Anemia Panel: Recent Labs    11/11/18 2028 11/14/18 0308 11/14/18 1009  VITAMINB12  --   --  810  FERRITIN  --   --  5,917*  TIBC  --   --  111*  IRON  --   --  17*  RETICCTPCT 1.2  0.8  --    Urine analysis: No results found for: COLORURINE, APPEARANCEUR, LABSPEC, PHURINE, GLUCOSEU, HGBUR, BILIRUBINUR, KETONESUR, PROTEINUR, UROBILINOGEN, NITRITE, LEUKOCYTESUR Sepsis Labs: @LABRCNTIP (procalcitonin:4,lacticidven:4)  ) Recent Results (from the past 240 hour(s))  MRSA PCR Screening     Status: None   Collection Time: 11/11/18  4:21 PM  Result Value Ref Range Status   MRSA by PCR NEGATIVE NEGATIVE Final    Comment:        The GeneXpert MRSA Assay (FDA approved for NASAL specimens only), is one component of a comprehensive MRSA colonization surveillance program. It is not intended to diagnose MRSA infection nor to guide or monitor treatment for MRSA infections. Performed at [facility], [place] [address], [place], [place] [address]   Culture, blood (routine x 2)     Status: None (Preliminary result)   Collection Time: 11/13/18  3:54 PM  Result Value Ref Range  Status   Specimen Description   Final    BLOOD RIGHT ANTECUBITAL Performed at [facility] [address] [place], [place] [address]    Special Requests   Final    BOTTLES DRAWN AEROBIC AND ANAEROBIC Blood Culture adequate volume Performed at [facility] [address], [place], [place] [address]    Culture   Final    NO GROWTH < 24 HOURS Performed at [facility] [address] [place], [place] [address]    Report Status PENDING  Incomplete  Culture, blood (routine x 2)     Status: None (Preliminary result)   Collection Time: 11/13/18  4:02 PM  Result Value Ref Range Status   Specimen Description   Final    BLOOD RIGHT FOREARM Performed at [facility], [place] [address], [place], [place] [address]    Special Requests   Final    BOTTLES DRAWN AEROBIC AND ANAEROBIC Blood Culture adequate volume Performed at [facility] [address] [place], [place] [address]    Culture   Final    NO GROWTH < 24  HOURS Performed at [facility] [address], [place], [place] [address]    Report Status PENDING  Incomplete      Studies: No results found.  Scheduled Meds: . sodium chloride   Intravenous Once  . feeding supplement  1 Container Oral TID BM  . levothyroxine  75 mcg Oral Q0600  . mouth rinse  15 mL Mouth Rinse BID  . multivitamin with minerals  1 tablet Oral Daily  . nebivolol  10 mg Oral Daily  . scopolamine  1 patch Transdermal Q72H    Continuous Infusions:   LOS: 3 days     Kayleen Memos, MD [facility] Pager [phone]  If 7PM-7AM, please contact night-coverage [email or web address] Password [email or web address] 11/14/2018, 1:45 PM

## 2018-11-14 NOTE — Plan of Care (Signed)
  Problem: Clinical Measurements: Goal: Respiratory complications will improve Outcome: Progressing   Problem: Education: Goal: Knowledge of General Education information will improve Description: Including pain rating scale, medication(s)/side effects and non-pharmacologic comfort measures Outcome: Progressing   

## 2018-11-14 NOTE — Anesthesia Postprocedure Evaluation (Signed)
Anesthesia Post Note  Patient: Cindy Ward  Procedure(s) Performed: AXILLARY LYMPH NODE BIOPSY LEFT (Left Axilla)     Patient location during evaluation: PACU Anesthesia Type: General Level of consciousness: awake and alert Pain management: pain level controlled Vital Signs Assessment: post-procedure vital signs reviewed and stable Respiratory status: spontaneous breathing, nonlabored ventilation, respiratory function stable and patient connected to nasal cannula oxygen Cardiovascular status: blood pressure returned to baseline and stable Postop Assessment: no apparent nausea or vomiting Anesthetic complications: no    Last Vitals:  Vitals:   11/14/18 0630 11/14/18 0631  BP: (!) 157/74   Pulse: 87 99  Resp: (!) 24 (!) 23  Temp:  37.3 C  SpO2: 96% 100%    Last Pain:  Vitals:   11/14/18 0631  TempSrc: Oral  PainSc:                  [place]

## 2018-11-15 ENCOUNTER — Encounter (HOSPITAL_COMMUNITY): Payer: Self-pay | Admitting: Radiology

## 2018-11-15 ENCOUNTER — Inpatient Hospital Stay (HOSPITAL_COMMUNITY): Payer: Medicaid Other

## 2018-11-15 LAB — COMPREHENSIVE METABOLIC PANEL
ALT: 24 U/L (ref 0–44)
AST: 43 U/L — ABNORMAL HIGH (ref 15–41)
Albumin: 2 g/dL — ABNORMAL LOW (ref 3.5–5.0)
Alkaline Phosphatase: 120 U/L (ref 38–126)
Anion gap: 9 (ref 5–15)
BUN: 12 mg/dL (ref 6–20)
CO2: 28 mmol/L (ref 22–32)
Calcium: 7.7 mg/dL — ABNORMAL LOW (ref 8.9–10.3)
Chloride: 97 mmol/L — ABNORMAL LOW (ref 98–111)
Creatinine, Ser: 0.77 mg/dL (ref 0.44–1.00)
GFR calc Af Amer: >60 mL/min (ref >60)
GFR calc non Af Amer: >60 mL/min (ref >60)
Glucose, Bld: 112 mg/dL — ABNORMAL HIGH (ref 70–99)
Potassium: 2.6 mmol/L — CL (ref 3.5–5.1)
Sodium: 134 mmol/L — ABNORMAL LOW (ref 135–145)
Total Bilirubin: 1.5 mg/dL — ABNORMAL HIGH (ref 0.3–1.2)
Total Protein: 5.4 g/dL — ABNORMAL LOW (ref 6.5–8.1)

## 2018-11-15 LAB — CBC WITH DIFFERENTIAL/PLATELET
Abs Immature Granulocytes: 0.12 10*3/uL — ABNORMAL HIGH (ref 0.00–0.07)
BASOS PCT: 0 %
Basophils Absolute: 0 10*3/uL (ref 0.0–0.1)
Eosinophils Absolute: 0 10*3/uL (ref 0.0–0.5)
Eosinophils Relative: 0 %
HCT: 24.3 % — ABNORMAL LOW (ref 36.0–46.0)
Hemoglobin: 7.7 g/dL — ABNORMAL LOW (ref 12.0–15.0)
Immature Granulocytes: 3 %
Lymphocytes Relative: 8 %
Lymphs Abs: 0.3 10*3/uL — ABNORMAL LOW (ref 0.7–4.0)
MCH: 27.4 pg (ref 26.0–34.0)
MCHC: 31.7 g/dL (ref 30.0–36.0)
MCV: 86.5 fL (ref 80.0–100.0)
Monocytes Absolute: 0.4 10*3/uL (ref 0.1–1.0)
Monocytes Relative: 11 %
Neutro Abs: 2.9 10*3/uL (ref 1.7–7.7)
Neutrophils Relative %: 78 %
PLATELETS: 87 10*3/uL — AB (ref 150–400)
RBC: 2.81 MIL/uL — ABNORMAL LOW (ref 3.87–5.11)
RDW: 18.2 % — ABNORMAL HIGH (ref 11.5–15.5)
WBC: 3.7 10*3/uL — ABNORMAL LOW (ref 4.0–10.5)
nRBC: 0 % (ref 0.0–0.2)

## 2018-11-15 LAB — TYPE AND SCREEN
ABO/RH(D): AB NEG
Antibody Screen: NEGATIVE
Unit division: 0
Unit division: 0

## 2018-11-15 LAB — URINE CULTURE: Culture: NO GROWTH

## 2018-11-15 LAB — BPAM RBC
Blood Product Expiration Date: 202003082359
Blood Product Expiration Date: 202003182359
ISSUE DATE / TIME: 202002172344
ISSUE DATE / TIME: 202002200444
Unit Type and Rh: 600
Unit Type and Rh: 600

## 2018-11-15 LAB — PHOSPHORUS: PHOSPHORUS: 1.7 mg/dL — AB (ref 2.5–4.6)

## 2018-11-15 LAB — BASIC METABOLIC PANEL WITH GFR
Anion gap: 8 (ref 5–15)
BUN: 13 mg/dL (ref 6–20)
CO2: 29 mmol/L (ref 22–32)
Calcium: 7.5 mg/dL — ABNORMAL LOW (ref 8.9–10.3)
Chloride: 97 mmol/L — ABNORMAL LOW (ref 98–111)
Creatinine, Ser: 0.83 mg/dL (ref 0.44–1.00)
GFR calc Af Amer: >60 mL/min
GFR calc non Af Amer: >60 mL/min
Glucose, Bld: 134 mg/dL — ABNORMAL HIGH (ref 70–99)
Potassium: 3.2 mmol/L — ABNORMAL LOW (ref 3.5–5.1)
Sodium: 134 mmol/L — ABNORMAL LOW (ref 135–145)

## 2018-11-15 LAB — MAGNESIUM: Magnesium: 2 mg/dL (ref 1.7–2.4)

## 2018-11-15 LAB — FOLATE RBC
FOLATE, HEMOLYSATE: 246 ng/mL
Folate, RBC: 1098 ng/mL (ref >498)
Hematocrit: 22.4 % — ABNORMAL LOW (ref 34.0–46.6)

## 2018-11-15 MED ORDER — POTASSIUM CHLORIDE 10 MEQ/100ML IV SOLN
10.0000 meq | INTRAVENOUS | Status: AC
  Administered 2018-11-15 (×4): 10 meq via INTRAVENOUS
  Filled 2018-11-15 (×4): qty 100

## 2018-11-15 MED ORDER — POTASSIUM PHOSPHATES 15 MMOLE/5ML IV SOLN
30.0000 mmol | Freq: Once | INTRAVENOUS | Status: AC
  Administered 2018-11-15: 30 mmol via INTRAVENOUS
  Filled 2018-11-15: qty 10

## 2018-11-15 MED ORDER — FLUMAZENIL 0.5 MG/5ML IV SOLN
INTRAVENOUS | Status: AC
  Filled 2018-11-15: qty 5

## 2018-11-15 MED ORDER — MIDAZOLAM HCL 2 MG/2ML IJ SOLN
INTRAMUSCULAR | Status: AC | PRN
  Administered 2018-11-15 (×2): 1 mg via INTRAVENOUS

## 2018-11-15 MED ORDER — FENTANYL CITRATE (PF) 100 MCG/2ML IJ SOLN
INTRAMUSCULAR | Status: AC | PRN
  Administered 2018-11-15 (×2): 50 ug via INTRAVENOUS

## 2018-11-15 MED ORDER — FENTANYL CITRATE (PF) 100 MCG/2ML IJ SOLN
INTRAMUSCULAR | Status: AC
  Filled 2018-11-15: qty 4

## 2018-11-15 MED ORDER — SODIUM CHLORIDE 0.9 % IV SOLN
INTRAVENOUS | Status: DC | PRN
  Administered 2018-11-15 – 2018-11-24 (×3): 250 mL via INTRAVENOUS
  Administered 2018-11-25 – 2018-11-29 (×2): 500 mL via INTRAVENOUS
  Administered 2018-11-30: 1000 mL via INTRAVENOUS

## 2018-11-15 MED ORDER — POTASSIUM PHOSPHATES 15 MMOLE/5ML IV SOLN: 40.0000 meq | Freq: Two times a day (BID) | INTRAVENOUS | Status: DC

## 2018-11-15 MED ORDER — MIDAZOLAM HCL 2 MG/2ML IJ SOLN
INTRAMUSCULAR | Status: AC
  Filled 2018-11-15: qty 4

## 2018-11-15 MED ORDER — SODIUM CHLORIDE 0.9 % IV BOLUS
1000.0000 mL | Freq: Once | INTRAVENOUS | Status: AC
  Administered 2018-11-15: 1000 mL via INTRAVENOUS

## 2018-11-15 MED ORDER — NALOXONE HCL 0.4 MG/ML IJ SOLN
INTRAMUSCULAR | Status: AC
  Filled 2018-11-15: qty 1

## 2018-11-15 MED ORDER — SODIUM CHLORIDE 0.9 % IV SOLN
INTRAVENOUS | Status: DC
  Administered 2018-11-15 – 2018-11-21 (×10): via INTRAVENOUS

## 2018-11-15 NOTE — Progress Notes (Signed)
Patient ID: Cindy Ward, female   DOB: [birth year], 53 y.o.   MRN: [id number]         [facility]    Date of Admission:  11/11/2018     She remains febrile but there is no convincing evidence of active infection.  The pathology report on her axillary lymph node biopsy remains pending.  I will continue observation off of antibiotics for now.  Please call Dr. Lita Mains [phone]) for any infectious disease questions this weekend.         Michel Bickers, MD [facility] [phone] pager   [phone] cell 11/15/2018, 11:23 AM

## 2018-11-15 NOTE — Consult Note (Signed)
Chief Complaint: Patient was seen in consultation today for bone marrow biopsy.  Referring Physician(s):   Supervising Physician: Daryll Brod  Patient Status: [facility] - In-pt  History of Present Illness: Cindy Ward is a 53 y.o. female with a past medical history significant for HTN and thyroid disease who was transferred from [facility] to [facility] on 11/11/18 due to need for higher level of care. The majority of her work up has been performed at [facility] so chart history is limited currently. She has apparenty been having fevers, chills, night sweats, nausea and vomiting for approximately 9 months. They are not present all the time and appear to happen in cycles. She has had significant weight loss during this time. Imaging showed axillary and subpectoral adenopathy. She underwent a bone marrow biopsy 06/2018 as well a right axillary lymph node core biopsy which were both non-diagnostic. Lab work was significant for pancytopenia. She underwent excisional biopsy of the left axillary lymph nodes on 11/12/18 with Dr. Ninfa Linden - the results of this biopsy are still pending. Oncology was consulted who has recommended repeating the bone marrow biopsy to attempt to further assess the etiology of her worsening pancytopenia prior to determining further work up/treatment. Request for bone marrow biopsy has been placed to IR.  Patient laying in bed comfortably upon presentation to her room - she states she is aware of need for repeat bone marrow biopsy and she agrees to the procedure but is not looking forward to it. She does wish to have answers regarding what is going on with her. She reports feeling ok since being in the hospital, occasional nausea and one episode of night sweats but otherwise no complaints.   Past Medical History:  Diagnosis Date  . Hypertension   . Thyroid disease     Past Surgical History:  Procedure Laterality Date  . AXILLARY LYMPH NODE BIOPSY Left 11/12/2018   Procedure: AXILLARY LYMPH NODE BIOPSY LEFT;  Surgeon: Coralie Keens, MD;  Location: [facility]S;  Service: General;  Laterality: Left;    Allergies: Patient has no known allergies.  Medications: Prior to Admission medications   Medication Sig Start Date End Date Taking? Authorizing Provider  levothyroxine (SYNTHROID, LEVOTHROID) 75 MCG tablet Take 1 tablet by mouth daily. 11/23/17  Yes [provider]  Multiple Vitamin (MULTIVITAMIN WITH MINERALS) TABS tablet Take 1 tablet by mouth daily.   Yes [provider]  nebivolol (BYSTOLIC) 10 MG tablet Take 1 tablet by mouth daily. 10/13/16  Yes [provider]  ondansetron (ZOFRAN-ODT) 8 MG disintegrating tablet Take 1 tablet by mouth as needed for nausea/vomiting. PRN TID 02/27/18  Yes [provider]  PROAIR HFA 108 (90 Base) MCG/ACT inhaler Take 2 puffs by mouth 4 (four) times daily as needed. 02/27/18  Yes [provider]     History reviewed. No pertinent family history.  Social History   Socioeconomic History  . Marital status: Divorced    Spouse name: Not on file  . Number of children: Not on file  . Years of education: Not on file  . Highest education level: Not on file  Occupational History  . Not on file  Social Needs  . Financial resource strain: Not on file  . Food insecurity:    Worry: Not on file    Inability: Not on file  . Transportation needs:    Medical: Not on file    Non-medical: Not on file  Tobacco Use  . Smoking status: Never Smoker  .  Smokeless tobacco: Never Used  Substance and Sexual Activity  . Alcohol use: Never    Frequency: Never  . Drug use: Never  . Sexual activity: Not on file  Lifestyle  . Physical activity:    Days per week: Not on file    Minutes per session: Not on file  . Stress: Not on file  Relationships  . Social connections:    Talks on phone: Not on file    Gets together: Not on file    Attends religious service: Not on file    Active member  of club or organization: Not on file    Attends meetings of clubs or organizations: Not on file    Relationship status: Not on file  Other Topics Concern  . Not on file  Social History Narrative  . Not on file     Review of Systems: A 12 point ROS discussed and pertinent positives are indicated in the HPI above.  All other systems are negative.  Review of Systems  Constitutional: Positive for appetite change, chills, diaphoresis, fatigue, fever and unexpected weight change.  Respiratory: Negative for cough and shortness of breath.   Cardiovascular: Negative for chest pain.  Gastrointestinal: Positive for nausea and vomiting. Negative for abdominal pain, blood in stool and diarrhea.  Genitourinary: Negative for hematuria.  Neurological: Negative for dizziness, syncope and headaches.    Vital Signs: BP (!) 115/50   Pulse 63   Temp 98.4 F (36.9 C) (Oral)   Resp (!) 21   Ht '5\' 8"'  (1.727 m)   Wt 159 lb 3.2 oz (72.2 kg)   LMP  (LMP Unknown) Comment: >10 years no period  SpO2 98%   BMI 24.21 kg/m   Physical Exam Vitals signs and nursing note reviewed.  Constitutional:      General: She is not in acute distress. HENT:     Head: Normocephalic.  Cardiovascular:     Rate and Rhythm: Normal rate and regular rhythm.  Pulmonary:     Effort: Pulmonary effort is normal.     Breath sounds: Normal breath sounds.  Abdominal:     General: There is no distension.     Palpations: Abdomen is soft.     Tenderness: There is no abdominal tenderness.  Skin:    General: Skin is warm and dry.  Neurological:     Mental Status: She is alert. Mental status is at baseline.  Psychiatric:        Mood and Affect: Mood normal.        Behavior: Behavior normal.        Thought Content: Thought content normal.        Judgment: Judgment normal.      MD Evaluation Airway: WNL((+) full top and bottom dentures) Heart: WNL Abdomen: WNL Chest/ Lungs: WNL ASA  Classification:  3 Mallampati/Airway Score: Two   Imaging: Dg Chest Port 1 View  Result Date: 11/11/2018 CLINICAL DATA:  Fever EXAM: PORTABLE CHEST 1 VIEW COMPARISON:  None. FINDINGS: Mild cardiomegaly. No focal consolidation or effusion. No pneumothorax. IMPRESSION: No active disease. Electronically Signed   By: Donavan Foil M.D.   On: 11/11/2018 21:59    Labs:  CBC: Recent Labs    11/11/18 2028  11/13/18 0252 11/14/18 0308 11/14/18 1009 11/15/18 0557  WBC 4.4  --  6.3 3.8*  --  3.7*  HGB 6.5*   < > 8.3* 6.7* 7.9* 7.7*  HCT 20.3*   < > 26.0* 20.9* 24.4* 24.3*  PLT  70*  --  81* 73*  --  87*   < > = values in this interval not displayed.    COAGS: Recent Labs    11/11/18 2028  INR 1.84  APTT 43*    BMP: Recent Labs    11/11/18 2028 11/13/18 0252 11/15/18 0557  NA 135 135 134*  K 3.3* 3.9 2.6*  CL 101 103 97*  CO2 '25 25 28  ' GLUCOSE 113* 144* 112*  BUN '17 18 12  ' CALCIUM 7.5* 7.9* 7.7*  CREATININE 0.90 0.75 0.77  GFRNONAA >60 >60 >60  GFRAA >60 >60 >60    LIVER FUNCTION TESTS: Recent Labs    05/14/18 1550 11/11/18 2028 11/13/18 0252 11/15/18 0557  BILITOT 0.3 1.9* 1.4* 1.5*  AST 26 190* 56* 43*  ALT 27 62* 38 24  ALKPHOS  --  96 83 120  PROT 7.0 6.1* 5.8* 5.4*  ALBUMIN  --  2.3* 2.1* 2.0*    TUMOR MARKERS: No results for input(s): AFPTM, CEA, CA199, CHROMGRNA in the last 8760 hours.  Assessment and Plan:  Patient with cyclical fevers, chills, night sweats, nausea and vomiting for approximately 9 months, lab work shows worsening pancytopenia with unclear etiology - she has undergone lymph node biopsy as well as bone marrow biopsy (06/2018) at [facility] which were both non-diagnostic. She has been transferred to [facility] for further evaluation. She underwent excisional biopsy of the left axillary lymph nodes on 11/12/18 with Dr. Ninfa Linden - the results of this biopsy are still pending. She was seen by oncology who has recommended a repeat bone marrow biopsy to  further direct workup and treatment plan. IR has been consulted for bone marrow biopsy today.  Patient has been NPO since 0200 when she states she had some water, she does not take blood thinning medications. Currently afebrile - Tmax 102.9 at 0400 today, WBC 3.7, hgb 7.7, plt 87, absolute neutrophils 2.9, infectious disease lab work is pending.  Risks and benefits discussed with the patient including, but not limited to bleeding, infection, damage to adjacent structures or low yield requiring additional tests.  All of the patient's questions were answered, patient is agreeable to proceed.  Consent signed and in chart.  Thank you for this interesting consult.  I greatly enjoyed meeting Cindy Ward and look forward to participating in their care.  A copy of this report was sent to the requesting provider on this date.  Electronically Signed: Joaquim Nam, PA-C 11/15/2018, 8:19 AM   I spent a total of 40 Minutes  in face to face in clinical consultation, greater than 50% of which was counseling/coordinating care for bone marrow biopsy.

## 2018-11-15 NOTE — Progress Notes (Signed)
PROGRESS NOTE  Cindy Ward VZC:[id number] DOB: [birth year] DOA: 11/11/2018 PCP: [name], MD  HPI/Recap of past 24 hours: 53 y.o.femalewith medical history significant ofhypertension hypothyroidism presents at [facility] with recurrent nausea, vomiting, fevers, and night sweats of 9 months duration.She notes that the symptoms occur about every 2 weeks. She has nightly fevers and night sweats. The symptoms occur for days at a time, but have been taking longer and longer to resolve.She came to the hospital today because she passed out at the cancer center. She describes getting dizzy and then hitting the floor.  She was seen in the emergency department at [facility]. Her vitals were notable for a fever to 102.7. Multiple enlarged lymph nodes noted on CT abdomen and pelvis. The case was discussed with [name] who recommended transfer to [facility] for further workup as well as LDH, haptoglobin, and retics.  11/14/18: Patient seen and examined at bedside.  No acute events overnight.  Lymph node biopsy done on 11/12/2018.  Awaiting results of pathology.  11/15/18: Recurrent fevers overnight.  She has no new complaints.  Plan for CT-guided bone marrow biopsy by interventional radiology this morning.    Assessment/Plan: Principal Problem:   FUO (fever of unknown origin) Active Problems:   Lymphadenopathy   Splenomegaly   Liver enzyme elevation   Thrombocytopenia (HCC)   Anemia   Unintentional weight loss   Hashimoto's thyroiditis   Goiter   Hypertension   Aortic atherosclerosis (HCC)   Pancytopenia (HCC)   Lymphadenopathy with concern for lymphoma Lymph node biopsies done on 11/12/2018 by general surgery Pathology pending, follow Persistent fevers Blood cultures x2- to date Chest x-ray unremarkable Negative urine analysis  Splenomegaly suspect secondary to #1 Persistent thrombocytopenia No sign of active bleeding  Pancytopenia, persistent Plan for  bone marrow biopsy by interventional radiology this a.m. Concern for lymphoproliferative disorder Hematology oncology Dr Maylon Peppers following  Persistent fever of unknown origin  Infectious disease consulted and following No need for antibiotics at this time Suspected lymphoma No sign of active infective process at this time.  Hyperbilirubinemia, persistent Total bilirubin 1.5 from 1.4 Alkaline phosphatase ALT and AST normal Continue to closely monitor  Chronic normocytic anemia Another drop in hemoglobin from 7.9 to 7.7 Baseline hemoglobin 10 No sign of active bleeding Continue to monitor Transfuse if hemoglobin drops below 7.5  Small pericardial effusion  2D echo 11/12/18 No evidence of cardiac tamponade  ?  Syncope Suspected secondary to anemia versus dehydration Continue gentle IV fluid hydration  Hypothyroidism/thyroid goiter Continue Synthroid  Hypertension Blood pressure stable Continue Bystolic  Moderate protein calorie malnutrition C/w oral supplement  Encourage increasing oral protein calorie intake Albumin 2.0 on 11/15/2018 BMI 24   DVT prophylaxis: SCD's Code Status: Full Family Communication:  Updated daughter Anderson Malta on the phone on 11/14/2018 Disposition Plan:  Home when hemodynamically stable or when heme oncology signs off.  Consultants:   General Surgery  ID  Procedures:  Lymph node biopsies done on 11/12/2018   Objective: Vitals:   11/15/18 0919 11/15/18 0945 11/15/18 1012 11/15/18 1050  BP: (!) 107/52 (!) 86/48 (!) 116/51 (!) 119/50  Pulse: 76 79 80 82  Resp: (!) 22 (!) 21 (!) 24 (!) 22  Temp:      TempSrc:      SpO2: 99% 100% 98% 98%  Weight:      Height:        Intake/Output Summary (Last 24 hours) at 11/15/2018 1131 Last data filed at 11/15/2018 1100 Gross  per 24 hour  Intake 458.77 ml  Output 425 ml  Net 33.77 ml   Filed Weights   11/13/18 0500 11/14/18 0500 11/15/18 0330  Weight: 70.1 kg 72.3 kg 72.2 kg     Exam:  . General: 53 y.o. year-old female well-developed well-nourished in no acute distress.  Alert and oriented x3. . Cardiovascular: Regular rate and rhythm with no rubs or gallops.  No JVD or thyromegaly noted. Marland Kitchen Respiratory: Clear to station with no wheezes or rales.  Good inspiratory effort.  . Abdomen: Soft nontender nondistended with normal bowel sounds x4 quadrants. . Musculoskeletal: No lower extremity edema. 2/4 pulses in all 4 extremities. Marland Kitchen Psychiatry: Mood is appropriate for condition and setting   Data Reviewed: CBC: Recent Labs  Lab 11/11/18 2028 11/12/18 0315 11/13/18 0252 11/14/18 0308 11/14/18 1009 11/15/18 0557  WBC 4.4  --  6.3 3.8*  --  3.7*  NEUTROABS 3.2  --   --  2.8  --  2.9  HGB 6.5* 8.1* 8.3* 6.7* 7.9* 7.7*  HCT 20.3* 25.7* 26.0* 20.9* 24.4* 24.3*  MCV 82.5  --  84.1 84.3  --  86.5  PLT 70*  --  81* 73*  --  87*   Basic Metabolic Panel: Recent Labs  Lab 11/11/18 2028 11/13/18 0252 11/15/18 0557  NA 135 135 134*  K 3.3* 3.9 2.6*  CL 101 103 97*  CO2 '25 25 28  ' GLUCOSE 113* 144* 112*  BUN '17 18 12  ' CREATININE 0.90 0.75 0.77  CALCIUM 7.5* 7.9* 7.7*  MG  --   --  2.0  PHOS  --   --  1.7*   GFR: Estimated Creatinine Clearance: 82 mL/min (by C-G formula based on SCr of 0.77 mg/dL). Liver Function Tests: Recent Labs  Lab 11/11/18 2028 11/13/18 0252 11/15/18 0557  AST 190* 56* 43*  ALT 62* 38 24  ALKPHOS 96 83 120  BILITOT 1.9* 1.4* 1.5*  PROT 6.1* 5.8* 5.4*  ALBUMIN 2.3* 2.1* 2.0*   No results for input(s): LIPASE, AMYLASE in the last 168 hours. No results for input(s): AMMONIA in the last 168 hours. Coagulation Profile: Recent Labs  Lab 11/11/18 2028  INR 1.84   Cardiac Enzymes: No results for input(s): CKTOTAL, CKMB, CKMBINDEX, TROPONINI in the last 168 hours. BNP (last 3 results) No results for input(s): PROBNP in the last 8760 hours. HbA1C: No results for input(s): HGBA1C in the last 72 hours. CBG: Recent Labs   Lab 11/11/18 1641  GLUCAP 110*   Lipid Profile: No results for input(s): CHOL, HDL, LDLCALC, TRIG, CHOLHDL, LDLDIRECT in the last 72 hours. Thyroid Function Tests: No results for input(s): TSH, T4TOTAL, FREET4, T3FREE, THYROIDAB in the last 72 hours. Anemia Panel: Recent Labs    11/14/18 0308 11/14/18 1009  VITAMINB12  --  810  FERRITIN  --  5,917*  TIBC  --  111*  IRON  --  17*  RETICCTPCT 0.8  --    Urine analysis:    Component Value Date/Time   COLORURINE YELLOW 11/14/2018 1619   APPEARANCEUR HAZY (A) 11/14/2018 1619   LABSPEC 1.016 11/14/2018 1619   PHURINE 5.0 11/14/2018 1619   GLUCOSEU NEGATIVE 11/14/2018 1619   HGBUR SMALL (A) 11/14/2018 1619   BILIRUBINUR NEGATIVE 11/14/2018 1619   KETONESUR NEGATIVE 11/14/2018 1619   PROTEINUR 30 (A) 11/14/2018 1619   NITRITE NEGATIVE 11/14/2018 1619   LEUKOCYTESUR NEGATIVE 11/14/2018 1619   Sepsis Labs: '@LABRCNTIP' (procalcitonin:4,lacticidven:4)  ) Recent Results (from the past 240 hour(s))  MRSA PCR Screening     Status: None   Collection Time: 11/11/18  4:21 PM  Result Value Ref Range Status   MRSA by PCR NEGATIVE NEGATIVE Final    Comment:        The GeneXpert MRSA Assay (FDA approved for NASAL specimens only), is one component of a comprehensive MRSA colonization surveillance program. It is not intended to diagnose MRSA infection nor to guide or monitor treatment for MRSA infections. Performed at [facility], [place] [address], [place], [place] [address]   Culture, blood (routine x 2)     Status: None (Preliminary result)   Collection Time: 11/13/18  3:54 PM  Result Value Ref Range Status   Specimen Description BLOOD RIGHT ANTECUBITAL  Final   Special Requests   Final    BOTTLES DRAWN AEROBIC AND ANAEROBIC Blood Culture adequate volume Performed at [place] [address], [place], [place] [address]    Culture NO GROWTH 2 DAYS  Final   Report Status PENDING   Incomplete  Culture, blood (routine x 2)     Status: None (Preliminary result)   Collection Time: 11/13/18  4:02 PM  Result Value Ref Range Status   Specimen Description   Final    BLOOD RIGHT FOREARM Performed at [facility], [place] [address], [place], [place] [address]    Special Requests   Final    BOTTLES DRAWN AEROBIC AND ANAEROBIC Blood Culture adequate volume Performed at [place] [address] [place],  [address]    Culture NO GROWTH 2 DAYS  Final   Report Status PENDING  Incomplete      Studies: Ct Biopsy  Result Date: Dec 12, 2018 INDICATION: Pancytopenia, concern for lymphoproliferative process EXAM: CT GUIDED RIGHT ILIAC BONE MARROW ASPIRATION AND CORE BIOPSY Date:  2018-12-12 2018-12-12 10:13 am Radiologist:  M. Daryll Brod, MD Guidance:  CT FLUOROSCOPY TIME:  Fluoroscopy Time: None. MEDICATIONS: 1% lidocaine local ANESTHESIA/SEDATION: 2.0 mg IV Versed; 50 mcg IV Fentanyl Moderate Sedation Time:  10 minutes The patient was continuously monitored during the procedure by the interventional radiology nurse under my direct supervision. CONTRAST:  None. COMPLICATIONS: None PROCEDURE: Informed consent was obtained from the patient following explanation of the procedure, risks, benefits and alternatives. The patient understands, agrees and consents for the procedure. All questions were addressed. A time out was performed. The patient was positioned prone and non-contrast localization CT was performed of the pelvis to demonstrate the iliac marrow spaces. Maximal barrier sterile technique utilized including caps, mask, sterile gowns, sterile gloves, large sterile drape, hand hygiene, and Betadine prep. Under sterile conditions and local anesthesia, an 11 gauge coaxial bone biopsy needle was advanced into the right iliac marrow space. Needle position was confirmed with CT imaging. Initially, bone marrow aspiration was performed. Next, the 11 gauge outer  cannula was utilized to obtain a right iliac bone marrow core biopsy. Needle was removed. Hemostasis was obtained with compression. The patient tolerated the procedure well. Samples were prepared with the cytotechnologist. No immediate complications. IMPRESSION: CT guided right iliac bone marrow aspiration and core biopsy. Electronically Signed   By: Jerilynn Mages.  Shick M.D.   On: Dec 12, 2018 11:25   Ct Bone Marrow Biopsy & Aspiration  Result Date: 2018/12/12 INDICATION: Pancytopenia, concern for lymphoproliferative process EXAM: CT GUIDED RIGHT ILIAC BONE MARROW ASPIRATION AND CORE BIOPSY Date:  12-12-2018 12/12/2018 10:13 am Radiologist:  M. Daryll Brod, MD Guidance:  CT FLUOROSCOPY TIME:  Fluoroscopy Time: None.  MEDICATIONS: 1% lidocaine local ANESTHESIA/SEDATION: 2.0 mg IV Versed; 50 mcg IV Fentanyl Moderate Sedation Time:  10 minutes The patient was continuously monitored during the procedure by the interventional radiology nurse under my direct supervision. CONTRAST:  None. COMPLICATIONS: None PROCEDURE: Informed consent was obtained from the patient following explanation of the procedure, risks, benefits and alternatives. The patient understands, agrees and consents for the procedure. All questions were addressed. A time out was performed. The patient was positioned prone and non-contrast localization CT was performed of the pelvis to demonstrate the iliac marrow spaces. Maximal barrier sterile technique utilized including caps, mask, sterile gowns, sterile gloves, large sterile drape, hand hygiene, and Betadine prep. Under sterile conditions and local anesthesia, an 11 gauge coaxial bone biopsy needle was advanced into the right iliac marrow space. Needle position was confirmed with CT imaging. Initially, bone marrow aspiration was performed. Next, the 11 gauge outer cannula was utilized to obtain a right iliac bone marrow core biopsy. Needle was removed. Hemostasis was obtained with compression. The patient  tolerated the procedure well. Samples were prepared with the cytotechnologist. No immediate complications. IMPRESSION: CT guided right iliac bone marrow aspiration and core biopsy. Electronically Signed   By: Jerilynn Mages.  Shick M.D.   On: 11/15/2018 11:25    Scheduled Meds: . sodium chloride   Intravenous Once  . feeding supplement  1 Container Oral TID BM  . flumazenil      . levothyroxine  75 mcg Oral Q0600  . mouth rinse  15 mL Mouth Rinse BID  . multivitamin with minerals  1 tablet Oral Daily  . naloxone      . nebivolol  10 mg Oral Daily  . scopolamine  1 patch Transdermal Q72H    Continuous Infusions: . sodium chloride Stopped (11/15/18 0847)  . potassium chloride       LOS: 4 days     Kayleen Memos, MD [facility] Pager [phone]  If 7PM-7AM, please contact night-coverage [email or web address] Password [facility] 11/15/2018, 11:31 AM

## 2018-11-15 NOTE — Procedures (Signed)
Anemia, thrombocytopenia, concern for lymphoproliferative disorder  S/p CT RT ILIAC BM ASP AND CORE No comp Stable ebl min Path pending Full report in pacs

## 2018-11-16 DIAGNOSIS — E440 Moderate protein-calorie malnutrition: Secondary | ICD-10-CM | POA: Diagnosis present

## 2018-11-16 LAB — HEPATITIS B SURFACE ANTIBODY,QUALITATIVE: Hep B S Ab: NONREACTIVE

## 2018-11-16 LAB — BASIC METABOLIC PANEL
Anion gap: 12 (ref 5–15)
BUN: 12 mg/dL (ref 6–20)
CO2: 23 mmol/L (ref 22–32)
Calcium: 7.5 mg/dL — ABNORMAL LOW (ref 8.9–10.3)
Chloride: 99 mmol/L (ref 98–111)
Creatinine, Ser: 0.73 mg/dL (ref 0.44–1.00)
GFR calc Af Amer: >60 mL/min (ref >60)
GFR calc non Af Amer: >60 mL/min (ref >60)
Glucose, Bld: 85 mg/dL (ref 70–99)
Potassium: 3.9 mmol/L (ref 3.5–5.1)
Sodium: 134 mmol/L — ABNORMAL LOW (ref 135–145)

## 2018-11-16 LAB — MAGNESIUM: Magnesium: 2 mg/dL (ref 1.7–2.4)

## 2018-11-16 LAB — RHEUMATOID FACTOR: Rheumatoid fact SerPl-aCnc: 11.2 [IU]/mL (ref 0.0–13.9)

## 2018-11-16 LAB — CBC
HCT: 25.1 % — ABNORMAL LOW (ref 36.0–46.0)
Hemoglobin: 7.9 g/dL — ABNORMAL LOW (ref 12.0–15.0)
MCH: 27.3 pg (ref 26.0–34.0)
MCHC: 31.5 g/dL (ref 30.0–36.0)
MCV: 86.9 fL (ref 80.0–100.0)
Platelets: 101 10*3/uL — ABNORMAL LOW (ref 150–400)
RBC: 2.89 MIL/uL — AB (ref 3.87–5.11)
RDW: 18.9 % — ABNORMAL HIGH (ref 11.5–15.5)
WBC: 3.8 10*3/uL — ABNORMAL LOW (ref 4.0–10.5)
nRBC: 0 % (ref 0.0–0.2)

## 2018-11-16 LAB — EPSTEIN BARR VRS(EBV DNA BY PCR)
EBV DNA QN BY PCR: 29956 {copies}/mL
LOG10 EBV DNA QN PCR: 4.476 {Log_copies}/mL

## 2018-11-16 LAB — ANTI-DNA ANTIBODY, DOUBLE-STRANDED: ds DNA Ab: <1 IU/mL (ref 0–9)

## 2018-11-16 LAB — HEPATITIS B SURFACE ANTIGEN: Hepatitis B Surface Ag: NEGATIVE

## 2018-11-16 LAB — HEPATITIS B CORE ANTIBODY, TOTAL: HEP B C TOTAL AB: NEGATIVE

## 2018-11-16 LAB — HEPATITIS C ANTIBODY

## 2018-11-16 LAB — PHOSPHORUS: Phosphorus: 2.4 mg/dL — ABNORMAL LOW (ref 2.5–4.6)

## 2018-11-16 LAB — BETA 2 MICROGLOBULIN, SERUM: Beta-2 Microglobulin: 3.5 mg/L — ABNORMAL HIGH (ref 0.6–2.4)

## 2018-11-16 LAB — CMV DNA, QUANTITATIVE, PCR
CMV DNA Quant: NEGATIVE IU/mL
Log10 CMV Qn DNA Pl: UNDETERMINED log10 IU/mL

## 2018-11-16 NOTE — Progress Notes (Signed)
PROGRESS NOTE  Cindy Ward APO:[id number] DOB: April 13, 1966 DOA: 11/11/2018 PCP: [name], MD  HPI/Recap of past 24 hours: 53 y.o.femalewith medical history significant ofhypertension hypothyroidism presents at [facility] with recurrent nausea, vomiting, fevers, and night sweats of 9 months duration.She notes that the symptoms occur about every 2 weeks. She has nightly fevers and night sweats. The symptoms occur for days at a time, but have been taking longer and longer to resolve.She came to the hospital today because she passed out at the cancer center. She describes getting dizzy and then hitting the floor.  She was seen in the emergency department at [facility] Her vitals were notable for a fever to 102.7. Multiple enlarged lymph nodes noted on CT abdomen and pelvis. The case was discussed with [name] who recommended transfer to [facility] for further workup as well as LDH, haptoglobin, and retics.  11/16/18: Seen and examined at bedside.  Reports feeling sweaty.  No other complaints.  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 Presented with lymphadenopathy and persistent fevers with no active infective process Lymph node biopsies done on 11/12/2018 by general surgery showed features consistent with classical Hodgkin lymphoma Awaiting results of bone marrow biopsy by IR  Splenomegaly suspect secondary to #1 Persistent thrombocytopenia No sign of active bleeding  Pancytopenia, persistent WBC, hemoglobin and platelet  numbers are trending up Hematology oncology Dr Maylon Peppers following  Hyperbilirubinemia, persistent Total bilirubin 1.5 from 1.4 Alkaline phosphatase ALT and AST normal Continue to closely monitor  Chronic normocytic anemia Hemoglobin stable at 7.9 Baseline hemoglobin 10 No sign of active bleeding  Small pericardial effusion  2D echo 11/12/18 No evidence of cardiac tamponade  ?  Syncope Suspected secondary to anemia versus dehydration Continue gentle IV fluid hydration  Hypothyroidism/thyroid goiter Continue Synthroid  Hypertension Blood pressure is at goal Continue Bystolic  Moderate protein calorie malnutrition C/w oral supplement  Encourage increasing oral protein calorie intake Albumin 2.0 on 11/15/2018 BMI 24   DVT prophylaxis: SCD's Code Status: Full Family Communication:  Updated daughter Cindy Ward on the phone on 11/14/2018 Disposition Plan:  Home when hemodynamically stable or when heme oncology signs off.  Consultants:   General Surgery  ID  Procedures:  Lymph node biopsies done on 11/12/2018   Objective: Vitals:   11/16/18 1105 11/16/18 1200 11/16/18 1300 11/16/18 1400  BP:  (!) 128/57 (!) 139/56 (!) 130/57  Pulse:  81 88 84  Resp:  (!) 26 (!) 32 (!) 40  Temp: 98.9 F (37.2 C)     TempSrc: Oral     SpO2:  98% 100% 97%  Weight:      Height:        Intake/Output Summary (Last 24 hours) at 11/16/2018 1526 Last data filed at 11/16/2018 0700 Gross per 24 hour  Intake 2973.99 ml  Output 900 ml  Net 2073.99 ml   Filed Weights   11/13/18 0500 11/14/18 0500 11/15/18 0330  Weight: 70.1 kg 72.3 kg 72.2 kg    Exam:  . General: 53 y.o. year-old female well developed well-nourished in no acute  distress.  Alert oriented x3. . Cardiovascular: Regular rate and rhythm with no rubs or gallops.  No JVD or thyromegaly . Respiratory: Clear to auscultation with no wheezes or rales.  Good inspiratory effort. . Abdomen: Soft nontender nondistended with normal  bowel sounds x4 quadrants. . Musculoskeletal: No lower extremity edema. 2/4 pulses in all 4 extremities. Marland Kitchen Psychiatry: Mood is appropriate for condition and setting   Data Reviewed: CBC: Recent Labs  Lab 11/11/18 2028  11/13/18 0252 11/14/18 0308 11/14/18 1009 11/15/18 0557 11/16/18 0605  WBC 4.4  --  6.3 3.8*  --  3.7* 3.8*  NEUTROABS 3.2  --   --  2.8  --  2.9  --   HGB 6.5*   < > 8.3* 6.7* 7.9* 7.7* 7.9*  HCT 20.3*   < > 26.0* 20.9* 24.4*  22.4* 24.3* 25.1*  MCV 82.5  --  84.1 84.3  --  86.5 86.9  PLT 70*  --  81* 73*  --  87* 101*   < > = values in this interval not displayed.   Basic Metabolic Panel: Recent Labs  Lab 11/11/18 2028 11/13/18 0252 11/15/18 0557 11/15/18 1636 11/16/18 0324  NA 135 135 134* 134* 134*  K 3.3* 3.9 2.6* 3.2* 3.9  CL 101 103 97* 97* 99  CO2 _0 GLUCOSE 113* 144* 112* 134* 85  BUN _1 CREATININE 0.90 0.75 0.77 0.83 0.73  CALCIUM 7.5* 7.9* 7.7* 7.5* 7.5*  MG  --   --  2.0  --  2.0  PHOS  --   --  1.7*  --  2.4*   GFR: Estimated Creatinine Clearance: 82 mL/min (by C-G formula based on SCr of 0.73 mg/dL). Liver Function Tests: Recent Labs  Lab 11/11/18 2028 11/13/18 0252 11/15/18 0557  AST 190* 56* 43*  ALT 62* 38 24  ALKPHOS 96 83 120  BILITOT 1.9* 1.4* 1.5*  PROT 6.1* 5.8* 5.4*  ALBUMIN 2.3* 2.1* 2.0*   No results for input(s): LIPASE, AMYLASE in the last 168 hours. No results for input(s): AMMONIA in the last 168 hours. Coagulation Profile: Recent Labs  Lab 11/11/18 2028  INR 1.84   Cardiac Enzymes: No results for input(s): CKTOTAL, CKMB, CKMBINDEX, TROPONINI in the last 168 hours. BNP (last 3 results) No results for input(s): PROBNP in the last 8760 hours. HbA1C: No results for input(s): HGBA1C in the last 72 hours. CBG: Recent Labs  Lab 11/11/18 1641  GLUCAP 110*   Lipid Profile: No results for input(s): CHOL, HDL, LDLCALC, TRIG, CHOLHDL, LDLDIRECT in the last 72 hours. Thyroid  Function Tests: No results for input(s): TSH, T4TOTAL, FREET4, T3FREE, THYROIDAB in the last 72 hours. Anemia Panel: Recent Labs    11/14/18 0308 11/14/18 1009  VITAMINB12  --  810  FERRITIN  --  5,917*  TIBC  --  111*  IRON  --  17*  RETICCTPCT 0.8  --    Urine analysis:    Component Value Date/Time   COLORURINE YELLOW 11/14/2018 1619   APPEARANCEUR HAZY (A) 11/14/2018 1619   LABSPEC 1.016 11/14/2018 1619   PHURINE 5.0 11/14/2018 1619   GLUCOSEU NEGATIVE 11/14/2018 1619   HGBUR SMALL (A) 11/14/2018 1619   BILIRUBINUR NEGATIVE 11/14/2018 1619   KETONESUR NEGATIVE 11/14/2018 1619   PROTEINUR 30 (A) 11/14/2018 1619   NITRITE NEGATIVE 11/14/2018 1619   LEUKOCYTESUR NEGATIVE 11/14/2018 1619   Sepsis Labs: _2 (procalcitonin:4,lacticidven:4)  ) Recent Results (from the past 240  hour(s))  MRSA PCR Screening     Status: None   Collection Time: 11/11/18  4:21 PM  Result Value Ref Range Status   MRSA by PCR NEGATIVE NEGATIVE Final    Comment:        The GeneXpert MRSA Assay (FDA approved for NASAL specimens only), is one component of a comprehensive MRSA colonization surveillance program. It is not intended to diagnose MRSA infection nor to guide or monitor treatment for MRSA infections. Performed at [facility], [place] [address] [place], [place] [address]   Culture, blood (routine x 2)     Status: None (Preliminary result)   Collection Time: 11/13/18  3:54 PM  Result Value Ref Range Status   Specimen Description BLOOD RIGHT ANTECUBITAL  Final   Special Requests   Final    BOTTLES DRAWN AEROBIC AND ANAEROBIC Blood Culture adequate volume Performed at [place] [address], [place], [place] [address]    Culture NO GROWTH 3 DAYS  Final   Report Status PENDING  Incomplete  Culture, blood (routine x 2)     Status: None (Preliminary result)   Collection Time: 11/13/18  4:02 PM  Result Value Ref Range Status   Specimen  Description   Final    BLOOD RIGHT FOREARM Performed at [facility], [place] [address], [place], [place] [address]    Special Requests   Final    BOTTLES DRAWN AEROBIC AND ANAEROBIC Blood Culture adequate volume Performed at [place] [address], [place], [place] [address]    Culture NO GROWTH 3 DAYS  Final   Report Status PENDING  Incomplete  Culture, Urine     Status: None   Collection Time: 11/14/18  4:20 PM  Result Value Ref Range Status   Specimen Description   Final    URINE, CLEAN CATCH Performed at [facility], [place] [address], [place], [place] [address]    Special Requests   Final    NONE Performed at [facility], [place] [address], [place], [place] [address]    Culture   Final    NO GROWTH Performed at [facility], [place] [address], [place], [place] [address]    Report Status 11/15/2018 FINAL  Final      Studies: No results found.  Scheduled Meds: . sodium chloride   Intravenous Once  . feeding supplement  1 Container Oral TID BM  . levothyroxine  75 mcg Oral Q0600  . mouth rinse  15 mL Mouth Rinse BID  . multivitamin with minerals  1 tablet Oral Daily  . scopolamine  1 patch Transdermal Q72H    Continuous Infusions: . sodium chloride Stopped (11/16/18 0800)  . sodium chloride 75 mL/hr at 11/16/18 0758     LOS: 5 days     Kayleen Memos, MD [facility] Pager [phone]  If 7PM-7AM, please contact night-coverage [email or web address] Password [facility] 11/16/2018, 3:26 PM

## 2018-11-17 MED ORDER — ENOXAPARIN SODIUM 40 MG/0.4ML ~~LOC~~ SOLN
40.0000 mg | SUBCUTANEOUS | Status: DC
  Administered 2018-11-17: 40 mg via SUBCUTANEOUS
  Filled 2018-11-17: qty 0.4

## 2018-11-17 MED ORDER — CEFAZOLIN SODIUM-DEXTROSE 2-4 GM/100ML-% IV SOLN
2.0000 g | INTRAVENOUS | Status: AC
  Administered 2018-11-18 (×2): 2 g via INTRAVENOUS
  Filled 2018-11-17 (×2): qty 100

## 2018-11-17 MED ORDER — ENOXAPARIN SODIUM 40 MG/0.4ML ~~LOC~~ SOLN
40.0000 mg | SUBCUTANEOUS | Status: DC
  Administered 2018-11-19 – 2018-11-23 (×5): 40 mg via SUBCUTANEOUS
  Filled 2018-11-17 (×5): qty 0.4

## 2018-11-17 NOTE — Consult Note (Signed)
Chief Complaint: Patient was seen in consultation today for No chief complaint on file.  at the request of [facility] a cath placement  Referring Physician(s): Dr Aileen Fass  Supervising Physician: Marybelle Killings  Patient Status: [facility] - In-pt  History of Present Illness: Cindy Ward is a 53 y.o. female   Newly diagnosed Hodgkin's Lymphoma To start chemo therapy this week  Scheduled for [facility] a cath placement   Past Medical History:  Diagnosis Date  . Hypertension   . Thyroid disease     Past Surgical History:  Procedure Laterality Date  . AXILLARY LYMPH NODE BIOPSY Left 11/12/2018   Procedure: AXILLARY LYMPH NODE BIOPSY LEFT;  Surgeon: Coralie Keens, MD;  Location: [facility] ORS;  Service: General;  Laterality: Left;    Allergies: Patient has no known allergies.  Medications: Prior to Admission medications   Medication Sig Start Date End Date Taking? Authorizing Provider  levothyroxine (SYNTHROID, LEVOTHROID) 75 MCG tablet Take 1 tablet by mouth daily. 11/23/17  Yes [provider]  Multiple Vitamin (MULTIVITAMIN WITH MINERALS) TABS tablet Take 1 tablet by mouth daily.   Yes [provider]  nebivolol (BYSTOLIC) 10 MG tablet Take 1 tablet by mouth daily. 10/13/16  Yes [provider]  ondansetron (ZOFRAN-ODT) 8 MG disintegrating tablet Take 1 tablet by mouth as needed for nausea/vomiting. PRN TID 02/27/18  Yes [provider]  PROAIR HFA 108 (90 Base) MCG/ACT inhaler Take 2 puffs by mouth 4 (four) times daily as needed. 02/27/18  Yes [provider]     History reviewed. No pertinent family history.  Social History   Socioeconomic History  . Marital status: Divorced    Spouse name: Not on file  . Number of children: Not on file  . Years of education: Not on file  . Highest education level: Not on file  Occupational History  . Not on file  Social Needs  . Financial resource strain: Not on file  . Food insecurity:    Worry: Not on  file    Inability: Not on file  . Transportation needs:    Medical: Not on file    Non-medical: Not on file  Tobacco Use  . Smoking status: Never Smoker  . Smokeless tobacco: Never Used  Substance and Sexual Activity  . Alcohol use: Never    Frequency: Never  . Drug use: Never  . Sexual activity: Not on file  Lifestyle  . Physical activity:    Days per week: 0 days    Minutes per session: 0 min  . Stress: To some extent  Relationships  . Social connections:    Talks on phone: Patient refused    Gets together: Patient refused    Attends religious service: Patient refused    Active member of club or organization: Patient refused    Attends meetings of clubs or organizations: Patient refused    Relationship status: Patient refused  Other Topics Concern  . Not on file  Social History Narrative  . Not on file    Review of Systems: A 12 point ROS discussed and pertinent positives are indicated in the HPI above.  All other systems are negative.  Review of Systems  Constitutional: Positive for activity change, appetite change, fatigue and fever.  Respiratory: Negative for cough and shortness of breath.   Gastrointestinal: Negative for abdominal pain.  Musculoskeletal: Positive for myalgias.  Neurological: Positive for dizziness and weakness.  Psychiatric/Behavioral: Negative for behavioral problems and confusion.    Vital Signs:  BP (!) 131/57   Pulse 72   Temp 98 F (36.7 C) (Oral)   Resp (!) 27   Ht 5' 8" (1.727 m)   Wt 160 lb 11.5 oz (72.9 kg)   LMP  (LMP Unknown) Comment: >10 years no period  SpO2 97%   BMI 24.44 kg/m   Physical Exam Vitals signs reviewed.  Cardiovascular:     Rate and Rhythm: Normal rate and regular rhythm.     Heart sounds: Normal heart sounds.  Pulmonary:     Effort: Pulmonary effort is normal.     Breath sounds: Normal breath sounds.  Abdominal:     General: Bowel sounds are normal.     Palpations: Abdomen is soft.  Musculoskeletal:  Normal range of motion.  Skin:    General: Skin is warm and dry.  Neurological:     Mental Status: She is alert and oriented to person, place, and time.  Psychiatric:        Mood and Affect: Mood normal.        Behavior: Behavior normal.        Thought Content: Thought content normal.        Judgment: Judgment normal.     Imaging: Ct Biopsy  Result Date: 11/15/2018 INDICATION: Pancytopenia, concern for lymphoproliferative process EXAM: CT GUIDED RIGHT ILIAC BONE MARROW ASPIRATION AND CORE BIOPSY Date:  11/15/2018 11/15/2018 10:13 am Radiologist:  M. Daryll Brod, MD Guidance:  CT FLUOROSCOPY TIME:  Fluoroscopy Time: None. MEDICATIONS: 1% lidocaine local ANESTHESIA/SEDATION: 2.0 mg IV Versed; 50 mcg IV Fentanyl Moderate Sedation Time:  10 minutes The patient was continuously monitored during the procedure by the interventional radiology nurse under my direct supervision. CONTRAST:  None. COMPLICATIONS: None PROCEDURE: Informed consent was obtained from the patient following explanation of the procedure, risks, benefits and alternatives. The patient understands, agrees and consents for the procedure. All questions were addressed. A time out was performed. The patient was positioned prone and non-contrast localization CT was performed of the pelvis to demonstrate the iliac marrow spaces. Maximal barrier sterile technique utilized including caps, mask, sterile gowns, sterile gloves, large sterile drape, hand hygiene, and Betadine prep. Under sterile conditions and local anesthesia, an 11 gauge coaxial bone biopsy needle was advanced into the right iliac marrow space. Needle position was confirmed with CT imaging. Initially, bone marrow aspiration was performed. Next, the 11 gauge outer cannula was utilized to obtain a right iliac bone marrow core biopsy. Needle was removed. Hemostasis was obtained with compression. The patient tolerated the procedure well. Samples were prepared with the cytotechnologist.  No immediate complications. IMPRESSION: CT guided right iliac bone marrow aspiration and core biopsy. Electronically Signed   By: Jerilynn Mages.  Shick M.D.   On: 11/15/2018 11:25   Dg Chest Port 1 View  Result Date: 11/11/2018 CLINICAL DATA:  Fever EXAM: PORTABLE CHEST 1 VIEW COMPARISON:  None. FINDINGS: Mild cardiomegaly. No focal consolidation or effusion. No pneumothorax. IMPRESSION: No active disease. Electronically Signed   By: Donavan Foil M.D.   On: 11/11/2018 21:59   Ct Bone Marrow Biopsy & Aspiration  Result Date: 11/15/2018 INDICATION: Pancytopenia, concern for lymphoproliferative process EXAM: CT GUIDED RIGHT ILIAC BONE MARROW ASPIRATION AND CORE BIOPSY Date:  11/15/2018 11/15/2018 10:13 am Radiologist:  M. Daryll Brod, MD Guidance:  CT FLUOROSCOPY TIME:  Fluoroscopy Time: None. MEDICATIONS: 1% lidocaine local ANESTHESIA/SEDATION: 2.0 mg IV Versed; 50 mcg IV Fentanyl Moderate Sedation Time:  10 minutes The patient was continuously monitored during the procedure by  the interventional radiology nurse under my direct supervision. CONTRAST:  None. COMPLICATIONS: None PROCEDURE: Informed consent was obtained from the patient following explanation of the procedure, risks, benefits and alternatives. The patient understands, agrees and consents for the procedure. All questions were addressed. A time out was performed. The patient was positioned prone and non-contrast localization CT was performed of the pelvis to demonstrate the iliac marrow spaces. Maximal barrier sterile technique utilized including caps, mask, sterile gowns, sterile gloves, large sterile drape, hand hygiene, and Betadine prep. Under sterile conditions and local anesthesia, an 11 gauge coaxial bone biopsy needle was advanced into the right iliac marrow space. Needle position was confirmed with CT imaging. Initially, bone marrow aspiration was performed. Next, the 11 gauge outer cannula was utilized to obtain a right iliac bone marrow core biopsy.  Needle was removed. Hemostasis was obtained with compression. The patient tolerated the procedure well. Samples were prepared with the cytotechnologist. No immediate complications. IMPRESSION: CT guided right iliac bone marrow aspiration and core biopsy. Electronically Signed   By: Jerilynn Mages.  Shick M.D.   On: 11/15/2018 11:25    Labs:  CBC: Recent Labs    11/13/18 0252 11/14/18 0308 11/14/18 1009 11/15/18 0557 11/16/18 0605  WBC 6.3 3.8*  --  3.7* 3.8*  HGB 8.3* 6.7* 7.9* 7.7* 7.9*  HCT 26.0* 20.9* 24.4*  22.4* 24.3* 25.1*  PLT 81* 73*  --  87* 101*    COAGS: Recent Labs    11/11/18 2028  INR 1.84  APTT 43*    BMP: Recent Labs    11/13/18 0252 11/15/18 0557 11/15/18 1636 11/16/18 0324  NA 135 134* 134* 134*  K 3.9 2.6* 3.2* 3.9  CL 103 97* 97* 99  CO2 _0 GLUCOSE 144* 112* 134* 85  BUN _1 CALCIUM 7.9* 7.7* 7.5* 7.5*  CREATININE 0.75 0.77 0.83 0.73  GFRNONAA >60 >60 >60 >60  GFRAA >60 >60 >60 >60    LIVER FUNCTION TESTS: Recent Labs    05/14/18 1550 11/11/18 2028 11/13/18 0252 11/15/18 0557  BILITOT 0.3 1.9* 1.4* 1.5*  AST 26 190* 56* 43*  ALT 27 62* 38 24  ALKPHOS  --  96 83 120  PROT 7.0 6.1* 5.8* 5.4*  ALBUMIN  --  2.3* 2.1* 2.0*    TUMOR MARKERS: No results for input(s): AFPTM, CEA, CA199, CHROMGRNA in the last 8760 hours.  Assessment and Plan:  Hodgkin's lymphoma To start treatment this week Scheduled for PAC placement Risks and benefits of image guided port-a-catheter placement was discussed with the patient including, but not limited to bleeding, infection, pneumothorax, or fibrin sheath development and need for additional procedures.  All of the patient's questions were answered, patient is agreeable to proceed. Consent signed and in chart.   Thank you for this interesting consult.  I greatly enjoyed meeting Desirey Keahey and look forward to participating in their care.  A copy of this report was sent to the requesting  provider on this date.  Electronically Signed: Lavonia Drafts, PA-C 11/17/2018, 1:20 PM   I spent a total of 20 Minutes    in face to face in clinical consultation, greater than 50% of which was counseling/coordinating care for [facility] placement

## 2018-11-17 NOTE — Progress Notes (Signed)
Order for Full PFT received. Called [facility] and left message for secretary to schedule the test. [facility] charge RT also notified of order and scheduling need.

## 2018-11-17 NOTE — Progress Notes (Addendum)
PROGRESS NOTE  Nao Linz ETK:[id number] DOB: Feb 11, 1966 DOA: 11/11/2018 PCP: [name], MD  HPI/Recap of past 24 hours: 53 y.o.femalewith medical history significant ofhypertension hypothyroidism presents at [facility] with recurrent nausea, vomiting, fevers, and night sweats of 9 months duration.She notes that the symptoms occur about every 2 weeks. She has nightly fevers and night sweats. The symptoms occur for days at a time, but have been taking longer and longer to resolve.She came to the hospital today because she passed out at the cancer center. She describes getting dizzy and then hitting the floor.  She was seen in the emergency department at [facility]. Her vitals were notable for a fever to 102.7. Multiple enlarged lymph nodes noted on CT abdomen and pelvis. The case was discussed with [name] who recommended transfer to [facility] for further workup as well as LDH, haptoglobin, and retics.  11/16/18: Seen and examined at bedside.  Reports feeling sweaty.  No other complaints.  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  11/17/18: Seen and examined at her bedside.  Feels hot.  Has a fever with T-max 103.2.  Discussed plan with heme oncology Dr. Talitha Givens with recommendation for PFT and Port-A-Cath placement for possible chemotherapy next week.    Assessment/Plan: Principal Problem:   FUO (fever of unknown origin) Active Problems:   Lymphadenopathy   Splenomegaly   Liver enzyme elevation   Thrombocytopenia (HCC)   Anemia   Unintentional weight loss   Hashimoto's thyroiditis   Goiter   Hypertension   Aortic atherosclerosis (HCC)   Pancytopenia (HCC)   Malnutrition of moderate degree   Newly diagnosed classical Hodgkin lymphoma Presented with lymphadenopathy and persistent fevers with no active infective process Lymph node biopsies done on 11/12/2018 by general surgery showed features consistent with  classical Hodgkin lymphoma Bone marrow biopsy done by IR pending for staging Discussed plan with heme oncology Dr Maylon Peppers Obtain PFT and Port-A-Cath placement as recommended by heme oncology Continues to have high fevers, treat symptomatically  Persistent fevers secondary to classical Hodgkin lymphoma Symptomatic management with anti-pyretic and IV fluid to prevent dehydration Will possibly start chemotherapy next week  Splenomegaly suspect secondary to #1 Persistent thrombocytopenia No sign of active bleeding  Pancytopenia, persistent WBC, hemoglobin and platelet numbers are trending up Hematology oncology Dr Maylon Peppers following  Hyperbilirubinemia, persistent Total bilirubin 1.5 from 1.4 Alkaline phosphatase ALT and AST normal Continue to closely monitor  Chronic normocytic anemia Hemoglobin stable at 7.9 Baseline hemoglobin 10 No sign of active bleeding  Small pericardial effusion  2D echo 11/12/18 No evidence of cardiac tamponade  ?  Syncope Suspected secondary to anemia versus dehydration Continue gentle IV fluid hydration  Hypothyroidism/thyroid goiter Continue Synthroid  Hypertension Blood pressure is at goal Continue Bystolic  Moderate protein calorie malnutrition C/w oral supplement  Encourage increasing oral protein calorie intake Albumin 2.0 on 11/15/2018 BMI 24   DVT prophylaxis:  Subcu Lovenox daily. Thrombocytopenia improving w plt>100,000. High risk for DVT in setting of newly diagnosed cancer. Code Status: Full Family Communication:  Updated daughter [name] on the phone 11/16/2018 Disposition Plan:  Home when heme oncology signs off.  Consultants:   General Surgery  ID  Procedures:  Lymph node biopsies done on 11/12/2018   Objective: Vitals:   11/17/18 0500 11/17/18 0600 11/17/18 0700 11/17/18 0800  BP: (!) 128/55 (!) 142/63 (!) 151/62 (!) 151/62  Pulse: 75 92 97 96  Resp: (!) 26 (!) 37 (!) 34 (!) 33  Temp:    (!) 103.2 F (39.6 C)    TempSrc:    Oral  SpO2: 99% 98% 96% 95%  Weight: 72.9 kg     Height:        Intake/Output Summary (Last 24 hours) at 11/17/2018 0910 Last data filed at 11/17/2018 0831 Gross per 24 hour  Intake 840 ml  Output -  Net 840 ml   Filed Weights   11/14/18 0500 11/15/18 0330 11/17/18 0500  Weight: 72.3 kg 72.2 kg 72.9 kg    Exam:  . General: 53 y.o. year-old female well-developed well-nourished looks uncomfortable due to sweating excessively.  Alert and oriented x3. . Cardiovascular: Regular rate and rhythm with no rubs or gallops.  No JVD or thyromegaly . Respiratory: Clear to Auscultation with No Wheezes or Rales.  Good inspiratory effort. . Abdomen: Soft nontender nondistended with normal bowel sounds x4 quadrants. . Musculoskeletal: No lower extremity edema. 2/4 pulses in all 4 extremities. Marland Kitchen Psychiatry: Mood is appropriate for condition and setting   Data Reviewed: CBC: Recent Labs  Lab 11/11/18 2028  11/13/18 0252 11/14/18 0308 11/14/18 1009 11/15/18 0557 11/16/18 0605  WBC 4.4  --  6.3 3.8*  --  3.7* 3.8*  NEUTROABS 3.2  --   --  2.8  --  2.9  --   HGB 6.5*   < > 8.3* 6.7* 7.9* 7.7* 7.9*  HCT 20.3*   < > 26.0* 20.9* 24.4*  22.4* 24.3* 25.1*  MCV 82.5  --  84.1 84.3  --  86.5 86.9  PLT 70*  --  81* 73*  --  87* 101*   < > = values in this interval not displayed.   Basic Metabolic Panel: Recent Labs  Lab 11/11/18 2028 11/13/18 0252 11/15/18 0557 11/15/18 1636 11/16/18 0324  NA 135 135 134* 134* 134*  K 3.3* 3.9 2.6* 3.2* 3.9  CL 101 103 97* 97* 99  CO2 _0 GLUCOSE 113* 144* 112* 134* 85  BUN _1 CREATININE 0.90 0.75 0.77 0.83 0.73  CALCIUM 7.5* 7.9* 7.7* 7.5* 7.5*  MG  --   --  2.0  --  2.0  PHOS  --   --  1.7*  --  2.4*   GFR: Estimated Creatinine Clearance: 82 mL/min (by C-G formula based on SCr of 0.73 mg/dL). Liver Function Tests: Recent Labs  Lab 11/11/18 2028 11/13/18 0252 11/15/18 0557  AST 190* 56* 43*  ALT 62*  38 24  ALKPHOS 96 83 120  BILITOT 1.9* 1.4* 1.5*  PROT 6.1* 5.8* 5.4*  ALBUMIN 2.3* 2.1* 2.0*   No results for input(s): LIPASE, AMYLASE in the last 168 hours. No results for input(s): AMMONIA in the last 168 hours. Coagulation Profile: Recent Labs  Lab 11/11/18 2028  INR 1.84   Cardiac Enzymes: No results for input(s): CKTOTAL, CKMB, CKMBINDEX, TROPONINI in the last 168 hours. BNP (last 3 results) No results for input(s): PROBNP in the last 8760 hours. HbA1C: No results for input(s): HGBA1C in the last 72 hours. CBG: Recent Labs  Lab 11/11/18 1641  GLUCAP 110*   Lipid Profile: No results for input(s): CHOL, HDL, LDLCALC, TRIG, CHOLHDL, LDLDIRECT in the last 72 hours. Thyroid Function Tests: No results for input(s): TSH, T4TOTAL, FREET4, T3FREE, THYROIDAB in the last 72 hours. Anemia Panel: Recent Labs    11/14/18 1009  VITAMINB12 810  FERRITIN 5,917*  TIBC 111*  IRON 17*   Urine analysis:  Component Value Date/Time   COLORURINE YELLOW 11/14/2018 1619   APPEARANCEUR HAZY (A) 11/14/2018 1619   LABSPEC 1.016 11/14/2018 1619   PHURINE 5.0 11/14/2018 1619   GLUCOSEU NEGATIVE 11/14/2018 1619   HGBUR SMALL (A) 11/14/2018 1619   BILIRUBINUR NEGATIVE 11/14/2018 1619   [place] 11/14/2018 1619   PROTEINUR 30 (A) 11/14/2018 1619   NITRITE NEGATIVE 11/14/2018 1619   LEUKOCYTESUR NEGATIVE 11/14/2018 1619   Sepsis Labs: _0 (procalcitonin:4,lacticidven:4)  ) Recent Results (from the past 240 hour(s))  MRSA PCR Screening     Status: None   Collection Time: 11/11/18  4:21 PM  Result Value Ref Range Status   MRSA by PCR NEGATIVE NEGATIVE Final    Comment:        The GeneXpert MRSA Assay (FDA approved for NASAL specimens only), is one component of a comprehensive MRSA colonization surveillance program. It is not intended to diagnose MRSA infection nor to guide or monitor treatment for MRSA infections. Performed at [facility], [place] [address], [place], [place] [address]   Culture, blood (routine x 2)     Status: None (Preliminary result)   Collection Time: 11/13/18  3:54 PM  Result Value Ref Range Status   Specimen Description BLOOD RIGHT ANTECUBITAL  Final   Special Requests   Final    BOTTLES DRAWN AEROBIC AND ANAEROBIC Blood Culture adequate volume Performed at [place] [address] [place], [place] [address]    Culture NO GROWTH 4 DAYS  Final   Report Status PENDING  Incomplete  Culture, blood (routine x 2)     Status: None (Preliminary result)   Collection Time: 11/13/18  4:02 PM  Result Value Ref Range Status   Specimen Description   Final    BLOOD RIGHT FOREARM Performed at [facility], [place] [address] [place], [place] [address]    Special Requests   Final    BOTTLES DRAWN AEROBIC AND ANAEROBIC Blood Culture adequate volume Performed at [place] [address] [place], [place] [address]    Culture NO GROWTH 4 DAYS  Final   Report Status PENDING  Incomplete  Culture, Urine     Status: None   Collection Time: 11/14/18  4:20 PM  Result Value Ref Range Status   Specimen Description   Final    URINE, CLEAN CATCH Performed at [facility], [place] [address], [place], [place] [address]    Special Requests   Final    NONE Performed at [facility], [place] [address], [place], [place] [address]    Culture   Final    NO GROWTH Performed at [facility], [place] [address] [place], [place] [address]    Report Status 11/15/2018 FINAL  Final      Studies: No results found.  Scheduled Meds: . sodium chloride   Intravenous Once  . feeding supplement  1 Container Oral TID BM  . levothyroxine  75 mcg Oral Q0600  . mouth rinse  15 mL Mouth Rinse BID  . multivitamin with minerals  1 tablet Oral Daily  . scopolamine  1 patch Transdermal Q72H    Continuous Infusions: . sodium chloride Stopped  (11/16/18 0800)  . sodium chloride 75 mL/hr at 11/16/18 2055     LOS: 6 days     Kayleen Memos, MD [facility] Pager [phone]  If 7PM-7AM, please contact night-coverage [email or web address] Password [email or web address] 11/17/2018, 9:10 AM

## 2018-11-18 ENCOUNTER — Inpatient Hospital Stay (HOSPITAL_COMMUNITY): Payer: Medicaid Other

## 2018-11-18 ENCOUNTER — Other Ambulatory Visit: Payer: Self-pay | Admitting: Hematology

## 2018-11-18 ENCOUNTER — Encounter (HOSPITAL_COMMUNITY): Payer: Self-pay | Admitting: Interventional Radiology

## 2018-11-18 ENCOUNTER — Encounter (HOSPITAL_COMMUNITY): Payer: Self-pay

## 2018-11-18 DIAGNOSIS — C8128 Mixed cellularity classical Hodgkin lymphoma, lymph nodes of multiple sites: Secondary | ICD-10-CM

## 2018-11-18 DIAGNOSIS — C8190 Hodgkin lymphoma, unspecified, unspecified site: Secondary | ICD-10-CM | POA: Insufficient documentation

## 2018-11-18 HISTORY — PX: IR IMAGING GUIDED PORT INSERTION: IMG5740

## 2018-11-18 LAB — BASIC METABOLIC PANEL
Anion gap: 10 (ref 5–15)
BUN: 12 mg/dL (ref 6–20)
CO2: 22 mmol/L (ref 22–32)
Calcium: 7.3 mg/dL — ABNORMAL LOW (ref 8.9–10.3)
Chloride: 105 mmol/L (ref 98–111)
Creatinine, Ser: 0.68 mg/dL (ref 0.44–1.00)
GFR calc Af Amer: >60 mL/min (ref >60)
GLUCOSE: 79 mg/dL (ref 70–99)
Potassium: 2.9 mmol/L — ABNORMAL LOW (ref 3.5–5.1)
Sodium: 137 mmol/L (ref 135–145)

## 2018-11-18 LAB — CULTURE, BLOOD (ROUTINE X 2)
Culture: NO GROWTH
Culture: NO GROWTH
Special Requests: ADEQUATE
Special Requests: ADEQUATE

## 2018-11-18 LAB — CBC
HCT: 23.7 % — ABNORMAL LOW (ref 36.0–46.0)
Hemoglobin: 7.2 g/dL — ABNORMAL LOW (ref 12.0–15.0)
MCH: 26.7 pg (ref 26.0–34.0)
MCHC: 30.4 g/dL (ref 30.0–36.0)
MCV: 87.8 fL (ref 80.0–100.0)
Platelets: 109 10*3/uL — ABNORMAL LOW (ref 150–400)
RBC: 2.7 MIL/uL — AB (ref 3.87–5.11)
RDW: 19.4 % — ABNORMAL HIGH (ref 11.5–15.5)
WBC: 4 10*3/uL (ref 4.0–10.5)
nRBC: 0 % (ref 0.0–0.2)

## 2018-11-18 LAB — QUANTIFERON-TB GOLD PLUS (RQFGPL)
QuantiFERON Mitogen Value: 0.68 IU/mL
QuantiFERON Nil Value: 0.26 IU/mL
QuantiFERON TB1 Ag Value: 0.27 IU/mL
QuantiFERON TB2 Ag Value: 0.26 IU/mL

## 2018-11-18 LAB — QUANTIFERON-TB GOLD PLUS: QuantiFERON-TB Gold Plus: UNDETERMINED

## 2018-11-18 LAB — KAPPA/LAMBDA LIGHT CHAINS
Kappa free light chain: 17.7 mg/L (ref 3.3–19.4)
Kappa, lambda light chain ratio: 0.83 (ref 0.26–1.65)
Lambda free light chains: 21.3 mg/L (ref 5.7–26.3)

## 2018-11-18 LAB — PROTIME-INR
INR: 1.65
Prothrombin Time: 19.3 seconds — ABNORMAL HIGH (ref 11.4–15.2)

## 2018-11-18 MED ORDER — IOHEXOL 300 MG/ML  SOLN
100.0000 mL | Freq: Once | INTRAMUSCULAR | Status: AC | PRN
  Administered 2018-11-18: 100 mL via INTRAVENOUS

## 2018-11-18 MED ORDER — SODIUM CHLORIDE (PF) 0.9 % IJ SOLN
INTRAMUSCULAR | Status: AC
  Filled 2018-11-18: qty 50

## 2018-11-18 MED ORDER — POTASSIUM CHLORIDE 10 MEQ/100ML IV SOLN
10.0000 meq | INTRAVENOUS | Status: AC
  Administered 2018-11-18 (×6): 10 meq via INTRAVENOUS
  Filled 2018-11-18 (×3): qty 100

## 2018-11-18 MED ORDER — LORAZEPAM 0.5 MG PO TABS: 0.5000 mg | ORAL_TABLET | Freq: Four times a day (QID) | ORAL | 0 refills | Status: AC | PRN

## 2018-11-18 MED ORDER — LIDOCAINE HCL 1 % IJ SOLN
INTRAMUSCULAR | Status: AC
  Filled 2018-11-18: qty 20

## 2018-11-18 MED ORDER — MIDAZOLAM HCL 2 MG/2ML IJ SOLN
INTRAMUSCULAR | Status: AC | PRN
  Administered 2018-11-18 (×2): 1 mg via INTRAVENOUS

## 2018-11-18 MED ORDER — ONDANSETRON HCL 8 MG PO TABS: 8.0000 mg | ORAL_TABLET | Freq: Two times a day (BID) | ORAL | 1 refills | Status: AC | PRN

## 2018-11-18 MED ORDER — POTASSIUM CHLORIDE 10 MEQ/100ML IV SOLN
10.0000 meq | INTRAVENOUS | Status: DC
  Filled 2018-11-18 (×3): qty 100

## 2018-11-18 MED ORDER — HEPARIN SOD (PORK) LOCK FLUSH 100 UNIT/ML IV SOLN
INTRAVENOUS | Status: AC
  Filled 2018-11-18: qty 5

## 2018-11-18 MED ORDER — FENTANYL CITRATE (PF) 100 MCG/2ML IJ SOLN
INTRAMUSCULAR | Status: AC
  Filled 2018-11-18: qty 2

## 2018-11-18 MED ORDER — LIDOCAINE-PRILOCAINE 2.5-2.5 % EX CREA: TOPICAL_CREAM | CUTANEOUS | 3 refills | Status: DC

## 2018-11-18 MED ORDER — CEFAZOLIN SODIUM-DEXTROSE 2-4 GM/100ML-% IV SOLN
INTRAVENOUS | Status: AC
  Administered 2018-11-18: 2 g via INTRAVENOUS
  Filled 2018-11-18: qty 100

## 2018-11-18 MED ORDER — IOHEXOL 300 MG/ML  SOLN
15.0000 mL | Freq: Once | INTRAMUSCULAR | Status: DC | PRN
  Administered 2018-11-18: 30 mL via ORAL
  Filled 2018-11-18: qty 20

## 2018-11-18 MED ORDER — FENTANYL CITRATE (PF) 100 MCG/2ML IJ SOLN
INTRAMUSCULAR | Status: AC | PRN
  Administered 2018-11-18 (×2): 50 ug via INTRAVENOUS

## 2018-11-18 MED ORDER — PROCHLORPERAZINE MALEATE 10 MG PO TABS: 10.0000 mg | ORAL_TABLET | Freq: Four times a day (QID) | ORAL | 1 refills | Status: AC | PRN

## 2018-11-18 MED ORDER — DEXAMETHASONE 4 MG PO TABS: ORAL_TABLET | ORAL | 1 refills | Status: DC

## 2018-11-18 MED ORDER — MIDAZOLAM HCL 2 MG/2ML IJ SOLN
INTRAMUSCULAR | Status: AC
  Filled 2018-11-18: qty 4

## 2018-11-18 MED ORDER — IBUPROFEN 200 MG PO TABS
400.0000 mg | ORAL_TABLET | Freq: Three times a day (TID) | ORAL | Status: DC | PRN
  Administered 2018-11-18: 400 mg via ORAL
  Filled 2018-11-18: qty 2

## 2018-11-18 NOTE — Progress Notes (Signed)
Cindy Ward   DOB:05/06/1966   CH#:[id number]    HEME/ONC PROBLEMS: 1. 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 -I reviewed the pathology results in detail with the patient -Ideally, PET scan prior to treatment would be beneficial to complete the staging work-up; however, PET scan cannot be done while inpatient, and she will require initiation of chemotherapy while inpatient due to persistent constitutional symptoms, including fever up to 103 -Therefore, I recommend obtaining CT neck, chest, abdomen/pelvis w/ contrast for baseline scan prior to starting treatment, and I will try to arrange for outpatient PET after discharge -She is also scheduled for port placement today  -We discussed the role of chemotherapy. The intent is for curative intent.  -Given the high IPI score, AVD + Adcedris has shown to be superior to ABVD -We discussed some of the risks, benefits, side-effects of Adriamycin, Brentuximab, Dacarbazine and Vinblastine -Some of the short term side-effects included, though not limited to, risk of fatigue, risk of allergic reactions, cardiomyopathy, lung problems, mouth sores, weight loss, pancytopenia, life-threatening infections, need for transfusions of blood products, nausea, vomiting, change in bowel habits, blood clots, admission to hospital for various reasons, and risks of death.  -Long term side-effects are also discussed including risks of infertility, permanent damage to nerve function, chronic fatigue, and rare secondary malignancy including bone marrow disorders and leukemia.  -The patient  is aware that the response rates discussed earlier is not guaranteed.  After a long discussion, patient made an informed decision to proceed with the prescribed plan of care.  -Neupogen to substitute for Neulasta while inpatient; plan for 5 days  -The plan is to transfer the patient to the sixth floor at [facility], where she will tentatively begin the Cycle 1 Day 1 of AVD + A on 11/19/2018; I have discussed this at length with pharmacy and will confirm the plan to start treatment tomorrow   A total of more than 45 minutes were spent face-to-face with the patient during this visit and over half of that time was spent on counseling and coordination of care as outlined above.  Thank you for the opportunity to care for Cindy Ward.  Please not hesitate contact me if there are any questions.  Tish Men, MD 11/18/2018  9:08 AM   Subjective:  Cindy Ward reports that she still has periodic fevers and feels very poor overall.  She reports several month of occasional tingling sensation in her fingertips, but it only lasts a few seconds, occurs once a day, and resolves without any intervention.  She denies any other new complaint today.  ROS: Constitutional: ( + ) fevers, ( - )  chills , ( - ) night sweats Ears, nose, mouth, throat, and face: ( - ) mucositis, ( - ) sore throat Respiratory: ( - ) cough, ( - ) dyspnea, ( - ) wheezes Cardiovascular: ( - ) palpitation, ( - ) chest discomfort, ( - ) lower extremity swelling Gastrointestinal:  ( - ) nausea, ( - ) heartburn, ( - ) change in bowel habits Skin: ( - ) abnormal skin rashes Behavioral/Psych: ( - ) mood change, ( - ) new changes  All other systems were reviewed with  the patient and are negative.  Objective:  Vitals:   11/18/18 0621 11/18/18 0822  BP:  (!) 122/54  Pulse:  85  Resp:  16  Temp: 100.1 F (37.8 C)   SpO2:  97%     Intake/Output Summary (Last 24 hours) at 11/18/2018 0908 Last data filed at 11/18/2018 4827 Gross per 24 hour  Intake  2001.01 ml  Output -  Net 2001.01 ml    GENERAL: alert, no acute distress, chronically ill appearing  SKIN: skin color, texture, turgor are normal, no rashes or significant lesions EYES: conjunctiva are pink and non-injected, sclera clear OROPHARYNX: no exudate, no erythema; lips, buccal mucosa, and tongue normal  NECK: thin, palpable cervical LN's, L > R LUNGS: clear to auscultation with normal breathing effort HEART: regular rate & rhythm and no murmurs and no lower extremity edema ABDOMEN: soft, non-tender, non-distended, normal bowel sounds PSYCH: alert & oriented x 3, fluent speech NEURO: no focal motor/sensory deficits   Labs:  Lab Results  Component Value Date   WBC 4.0 11/18/2018   HGB 7.2 (L) 11/18/2018   HCT 23.7 (L) 11/18/2018   MCV 87.8 11/18/2018   PLT 109 (L) 11/18/2018   NEUTROABS 2.9 11/15/2018    Lab Results  Component Value Date   NA 137 11/18/2018   K 2.9 (L) 11/18/2018   CL 105 11/18/2018   CO2 22 11/18/2018

## 2018-11-18 NOTE — Sedation Documentation (Signed)
Port aspirates and flushes. No heparin port was left accessed.

## 2018-11-18 NOTE — Progress Notes (Signed)
Patient ID: Cindy Ward, female   DOB: 07/29/1966, 53 y.o.   MRN: [id number]          [facility]    Date of Admission:  11/11/2018     The pathology report on Ms. Hotz lymph node biopsy confirmed Hodgkin's lymphoma.  This is the cause of her protracted illness and fevers.  I will sign off now.         Michel Bickers, MD [facility] [phone] pager   [phone] cell 11/18/2018, 11:18 AM

## 2018-11-18 NOTE — Progress Notes (Signed)
Dr. Nevada Crane called and informed the patient's temp was 103 after Tylenol given. She ordered motrin.

## 2018-11-18 NOTE — Progress Notes (Signed)
Temp 98.7, orally. Family at bedside

## 2018-11-18 NOTE — Progress Notes (Signed)
MEDICATION-RELATED CONSULT NOTE   IR Procedure Consult - Anticoagulant/Antiplatelet PTA/Inpatient Med List Review by Pharmacist    Procedure: Placement of a right IJ approach single lumen PowerPort.    Completed: 11/18/18 @ 11:10  Post-Procedural bleeding risk per IR MD assessment:    Antithrombotic medications on inpatient or PTA profile prior to procedure:    Lovenox 40mg  SQ q24h  Recommended restart time per IR Post-Procedure Guidelines:     Day + 1 (Next AM)     Other considerations:      Plan:    Resume Lovenox 40mg  SQ q24h starting 11/19/18   Peggyann Juba, PharmD, BCPS 11/18/2018 11:31 AM

## 2018-11-18 NOTE — Procedures (Signed)
Interventional Radiology Procedure Note  Procedure: Placement of a right IJ approach single lumen PowerPort.  Tip is positioned at the superior cavoatrial junction and catheter is ready for immediate use.  Complications: None Recommendations:  - Ok to shower tomorrow - Do not submerge - Excellent wound care.  Inpt ports are at baseline associated with higher rates of infection. - Line care   Signed,  Dulcy Fanny. Earleen Newport, DO

## 2018-11-18 NOTE — Progress Notes (Signed)
Pt brought to the floor via bed with ICU staff present. Pt denies pain at this time with no s/s of distress noted. Pts assessment is unchanged from earlier documentation. Pts call bell is within reach.

## 2018-11-18 NOTE — Progress Notes (Signed)
START ON PATHWAY REGIMEN - Lymphoma and CLL     A cycle is every 28 days:     Doxorubicin      Vinblastine      Dacarbazine      Brentuximab vedotin   **Always confirm dose/schedule in your pharmacy ordering system**  Patient Characteristics: Classical Hodgkin Lymphoma, First Line, Stage III / IV, Age < 75 Disease Type: Not Applicable Disease Type: Not Applicable Disease Type: Classical Hodgkin Lymphoma Line of therapy: First Line [place] Stage: IVB Age: < 63 Intent of Therapy: Curative Intent, Discussed with Patient

## 2018-11-18 NOTE — Progress Notes (Signed)
PROGRESS NOTE  Cindy Ward MOQ:[id number] DOB: 27-Jan-1966 DOA: 11/11/2018 PCP: [name], MD  HPI/Recap of past 24 hours: 53 y.o.femalewith medical history significant ofhypertension hypothyroidism presents at [facility] with recurrent nausea, vomiting, fevers, and night sweats of 9 months duration.She notes that the symptoms occur about every 2 weeks. She has nightly fevers and night sweats. The symptoms occur for days at a time, but have been taking longer and longer to resolve.She came to the hospital today because she passed out at the cancer center. She describes getting dizzy and then hitting the floor.  She was seen in the emergency department at [facility]. Her vitals were notable for a fever to 102.7. Multiple enlarged lymph nodes noted on CT abdomen and pelvis. The case was discussed with [name] who recommended transfer to [facility] for further workup as well as LDH, haptoglobin, and retics.  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  Hospital course complicated by recurrent high fevers related to malignancy.  11/18/2018: Patient examined at bedside.  No acute events overnight.  No new complaints.  Port-A-Cath placed by interventional radiology.  Highly appreciated.    Assessment/Plan: Principal Problem:   Hodgkin lymphoma (HCC) Active Problems:   Lymphadenopathy   Splenomegaly   Liver enzyme elevation   Unintentional weight loss   Hashimoto's thyroiditis   Goiter   Hypertension   Aortic atherosclerosis (HCC)   Pancytopenia (HCC)   Malnutrition of moderate degree   Newly diagnosed classical Hodgkin lymphoma Presented with lymphadenopathy and persistent fevers with no active infective process Lymph node biopsies done on 11/12/2018 by general surgery showed features consistent with classical Hodgkin lymphoma Bone marrow biopsy done by IR pending for staging Discussed plan with heme oncology  Dr Maylon Peppers Port-A-Cath placed on 11/18/2018 by interventional radiology. Persistent high fevers related to malignancy Heme oncology Dr. Maylon Peppers following  Persistent fevers secondary to classical Hodgkin lymphoma Symptomatic management with anti-pyretic and IV fluid to prevent dehydration Port-A-Cath in place Chemotherapy planned this week  Splenomegaly suspect secondary to #1 Persistent thrombocytopenia No sign of active bleeding  Malignancy related anemia and thrombocytopenia Hemoglobin 7.2.  Baseline hemoglobin 10 Repeat hemoglobin in the morning transfuse less than 7 Platelet count stable 109K  Hypokalemia Potassium 2.9 Repleted with IV potassium 10 mEq x 6 doses Repeat BMP in the morning  Hyperbilirubinemia, persistent Total bilirubin 1.5 from 1.4 Alkaline phosphatase ALT and AST normal Continue to closely monitor  Small pericardial effusion  2D echo 11/12/18 No evidence of cardiac tamponade  ?  Syncope Suspected secondary to anemia versus dehydration Continue gentle IV fluid hydration  Hypothyroidism/thyroid goiter Continue Synthroid  Hypertension Blood pressure is at goal Continue Bystolic  Moderate protein calorie malnutrition C/w oral supplement  Encourage increasing oral protein calorie intake Albumin 2.0 on 11/15/2018 BMI 24   DVT prophylaxis:  Restart subcu Lovenox tomorrow 11/19/2018   Code Status: Full Family Communication:  Updated daughter Anderson Malta on the phone 11/18/2018  Disposition Plan:  Home when heme oncology signs off.  Consultants:   General Surgery  ID  Procedures:  Lymph node biopsies done on 11/12/2018   Objective: Vitals:   11/18/18 1105 11/18/18 1200 11/18/18 1210 11/18/18 1446  BP: 129/65 (!) 142/61 138/70   Pulse: 86 (!) 102 90   Resp: 17 (!) 32 20   Temp:    (!) 102.9 F (39.4 C)  TempSrc:      SpO2: 100% 96% 98%   Weight:  Height:        Intake/Output Summary (Last 24 hours) at 11/18/2018 1500 Last data filed  at 11/18/2018 0853 Gross per 24 hour  Intake 2001.01 ml  Output -  Net 2001.01 ml   Filed Weights   11/15/18 0330 11/17/18 0500 11/18/18 0500  Weight: 72.2 kg 72.9 kg 73.4 kg    Exam:  . General: 53 y.o. year-old female well-developed well-nourished no acute distress.  Alert oriented x3.   . Cardiovascular: Regular rhythm with no rubs or gallops.  No JVD or thyromegaly noted. Marland Kitchen Respiratory: Clear to auscultation with no wheezes or rales.  Good inspiratory effort. . Abdomen: Soft nontender nondistended with normal bowel sounds x4 quadrants. . Musculoskeletal: No lower extremity edema. 2/4 pulses in all 4 extremities. Marland Kitchen Psychiatry: Mood is appropriate for condition and setting   Data Reviewed: CBC: Recent Labs  Lab 11/11/18 2028  11/13/18 0252 11/14/18 0308 11/14/18 1009 11/15/18 0557 11/16/18 0605 11/18/18 0354  WBC 4.4  --  6.3 3.8*  --  3.7* 3.8* 4.0  NEUTROABS 3.2  --   --  2.8  --  2.9  --   --   HGB 6.5*   < > 8.3* 6.7* 7.9* 7.7* 7.9* 7.2*  HCT 20.3*   < > 26.0* 20.9* 24.4*  22.4* 24.3* 25.1* 23.7*  MCV 82.5  --  84.1 84.3  --  86.5 86.9 87.8  PLT 70*  --  81* 73*  --  87* 101* 109*   < > = values in this interval not displayed.   Basic Metabolic Panel: Recent Labs  Lab 11/13/18 0252 11/15/18 0557 11/15/18 1636 11/16/18 0324 11/18/18 0354  NA 135 134* 134* 134* 137  K 3.9 2.6* 3.2* 3.9 2.9*  CL 103 97* 97* 99 105  CO2 '25 28 29 23 22  '$ GLUCOSE 144* 112* 134* 85 79  BUN '18 12 13 12 12  '$ CREATININE 0.75 0.77 0.83 0.73 0.68  CALCIUM 7.9* 7.7* 7.5* 7.5* 7.3*  MG  --  2.0  --  2.0  --   PHOS  --  1.7*  --  2.4*  --    GFR: Estimated Creatinine Clearance: 82 mL/min (by C-G formula based on SCr of 0.68 mg/dL). Liver Function Tests: Recent Labs  Lab 11/11/18 2028 11/13/18 0252 11/15/18 0557  AST 190* 56* 43*  ALT 62* 38 24  ALKPHOS 96 83 120  BILITOT 1.9* 1.4* 1.5*  PROT 6.1* 5.8* 5.4*  ALBUMIN 2.3* 2.1* 2.0*   No results for input(s): LIPASE,  AMYLASE in the last 168 hours. No results for input(s): AMMONIA in the last 168 hours. Coagulation Profile: Recent Labs  Lab 11/11/18 2028 11/18/18 0354  INR 1.84 1.65   Cardiac Enzymes: No results for input(s): CKTOTAL, CKMB, CKMBINDEX, TROPONINI in the last 168 hours. BNP (last 3 results) No results for input(s): PROBNP in the last 8760 hours. HbA1C: No results for input(s): HGBA1C in the last 72 hours. CBG: Recent Labs  Lab 11/11/18 1641  GLUCAP 110*   Lipid Profile: No results for input(s): CHOL, HDL, LDLCALC, TRIG, CHOLHDL, LDLDIRECT in the last 72 hours. Thyroid Function Tests: No results for input(s): TSH, T4TOTAL, FREET4, T3FREE, THYROIDAB in the last 72 hours. Anemia Panel: No results for input(s): VITAMINB12, FOLATE, FERRITIN, TIBC, IRON, RETICCTPCT in the last 72 hours. Urine analysis:    Component Value Date/Time   COLORURINE YELLOW 11/14/2018 1619   APPEARANCEUR HAZY (A) 11/14/2018 1619   LABSPEC 1.016 11/14/2018 1619   PHURINE  5.0 11/14/2018 1619   GLUCOSEU NEGATIVE 11/14/2018 1619   HGBUR SMALL (A) 11/14/2018 1619   Animas 11/14/2018 1619   Smoke Rise 11/14/2018 1619   PROTEINUR 30 (A) 11/14/2018 1619   NITRITE NEGATIVE 11/14/2018 1619   LEUKOCYTESUR NEGATIVE 11/14/2018 1619   Sepsis Labs: '@LABRCNTIP'$ (procalcitonin:4,lacticidven:4)  ) Recent Results (from the past 240 hour(s))  MRSA PCR Screening     Status: None   Collection Time: 11/11/18  4:21 PM  Result Value Ref Range Status   MRSA by PCR NEGATIVE NEGATIVE Final    Comment:        The GeneXpert MRSA Assay (FDA approved for NASAL specimens only), is one component of a comprehensive MRSA colonization surveillance program. It is not intended to diagnose MRSA infection nor to guide or monitor treatment for MRSA infections. Performed at [facility], [place] [address] [place], [place] [address]   Culture, blood (routine x 2)     Status: None    Collection Time: 11/13/18  3:54 PM  Result Value Ref Range Status   Specimen Description   Final    BLOOD RIGHT ANTECUBITAL Performed at [place] [address], [place], [place] [address]    Special Requests   Final    BOTTLES DRAWN AEROBIC AND ANAEROBIC Blood Culture adequate volume Performed at [place] [address] [place], [place] [address]    Culture   Final    NO GROWTH 5 DAYS Performed at [facility], [place] [address] [place], [place] [address]    Report Status 11/18/2018 FINAL  Final  Culture, blood (routine x 2)     Status: None   Collection Time: 11/13/18  4:02 PM  Result Value Ref Range Status   Specimen Description   Final    BLOOD RIGHT FOREARM Performed at [facility], [place] [address] [place], [place] [address]    Special Requests   Final    BOTTLES DRAWN AEROBIC AND ANAEROBIC Blood Culture adequate volume Performed at [place] [address], [place], [place] [address]    Culture   Final    NO GROWTH 5 DAYS Performed at [facility], [place] [address] [place], [place] [address]    Report Status 11/18/2018 FINAL  Final  Culture, Urine     Status: None   Collection Time: 11/14/18  4:20 PM  Result Value Ref Range Status   Specimen Description   Final    URINE, CLEAN CATCH Performed at [facility], [place] [address], [place], [place] [address]    Special Requests   Final    NONE Performed at [facility], [place] [address], [place], [place] [address]    Culture   Final    NO GROWTH Performed at [facility], [place] [address], [place], [place] [address]    Report Status 11/15/2018 FINAL  Final      Studies: Ir Imaging Guided Port Insertion  Result Date: 11/18/2018 INDICATION: 53 year old female with history lymphoma EXAM: IMPLANTED PORT A CATH PLACEMENT WITH ULTRASOUND AND FLUOROSCOPIC GUIDANCE MEDICATIONS: 2.0 g Ancef; The  antibiotic was administered within an appropriate time interval prior to skin puncture. ANESTHESIA/SEDATION: Moderate (conscious) sedation was employed during this procedure. A total of Versed 2.0 mg and Fentanyl 100 mcg was administered intravenously. Moderate Sedation Time: 18 minutes. The patient's level of consciousness and vital signs were monitored continuously by radiology nursing throughout the procedure  under my direct supervision. FLUOROSCOPY TIME:  0 minutes, 12 seconds (1.0 mGy) COMPLICATIONS: None PROCEDURE: The procedure, risks, benefits, and alternatives were explained to the patient. Questions regarding the procedure were encouraged and answered. The patient understands and consents to the procedure. Ultrasound survey was performed with images stored and sent to PACs. The right neck and chest was prepped with chlorhexidine, and draped in the usual sterile fashion using maximum barrier technique (cap and mask, sterile gown, sterile gloves, large sterile sheet, hand hygiene and cutaneous antiseptic). Antibiotic prophylaxis was provided with 2.0g Ancef administered IV one hour prior to skin incision. Local anesthesia was attained by infiltration with 1% lidocaine without epinephrine. Ultrasound demonstrated patency of the right internal jugular vein, and this was documented with an image. Under real-time ultrasound guidance, this vein was accessed with a 21 gauge micropuncture needle and image documentation was performed. A small dermatotomy was made at the access site with an 11 scalpel. A 0.018" wire was advanced into the SVC and used to estimate the length of the internal catheter. The access needle exchanged for a 56F micropuncture vascular sheath. The 0.018" wire was then removed and a 0.035" wire advanced into the IVC. An appropriate location for the subcutaneous reservoir was selected below the clavicle and an incision was made through the skin and underlying soft tissues. The subcutaneous tissues  were then dissected using a combination of blunt and sharp surgical technique and a pocket was formed. A single lumen power injectable portacatheter was then tunneled through the subcutaneous tissues from the pocket to the dermatotomy and the port reservoir placed within the subcutaneous pocket. The venous access site was then serially dilated and a peel away vascular sheath placed over the wire. The wire was removed and the port catheter advanced into position under fluoroscopic guidance. The catheter tip is positioned in the cavoatrial junction. This was documented with a spot image. The portacatheter was then tested and found to flush and aspirate well. The port was flushed with saline followed by 100 units/mL heparinized saline. The pocket was then closed in two layers using first subdermal inverted interrupted absorbable sutures followed by a running subcuticular suture. The epidermis was then sealed with Dermabond. The dermatotomy at the venous access site was also seal with Dermabond. Patient tolerated the procedure well and remained hemodynamically stable throughout. No complications encountered and no significant blood loss encountered IMPRESSION: Status post right IJ port catheter placement. Catheter ready for use. Signed, Dulcy Fanny. Dellia Nims, RPVI [facility] Electronically Signed   By: Corrie Mckusick D.O.   On: 11/18/2018 11:23    Scheduled Meds: . [START ON 11/19/2018] enoxaparin (LOVENOX) injection  40 mg Subcutaneous Q24H  . feeding supplement  1 Container Oral TID BM  . fentaNYL      . heparin lock flush      . levothyroxine  75 mcg Oral Q0600  . lidocaine      . mouth rinse  15 mL Mouth Rinse BID  . midazolam      . multivitamin with minerals  1 tablet Oral Daily  . scopolamine  1 patch Transdermal Q72H    Continuous Infusions: . sodium chloride Stopped (11/16/18 0800)  . sodium chloride 75 mL/hr at 11/18/18 1158  .  potassium chloride 10 mEq (11/18/18 1440)     LOS: 7 days     Kayleen Memos, MD [facility] Pager [phone]  If 7PM-7AM, please contact night-coverage [email or web address] Password [email or web address] 11/18/2018, 3:00  PM

## 2018-11-19 DIAGNOSIS — D649 Anemia, unspecified: Secondary | ICD-10-CM | POA: Diagnosis present

## 2018-11-19 DIAGNOSIS — R5081 Fever presenting with conditions classified elsewhere: Secondary | ICD-10-CM

## 2018-11-19 LAB — CBC
HCT: 22.1 % — ABNORMAL LOW (ref 36.0–46.0)
Hemoglobin: 6.9 g/dL — CL (ref 12.0–15.0)
MCH: 27.1 pg (ref 26.0–34.0)
MCHC: 31.2 g/dL (ref 30.0–36.0)
MCV: 86.7 fL (ref 80.0–100.0)
Platelets: 93 10*3/uL — ABNORMAL LOW (ref 150–400)
RBC: 2.55 MIL/uL — AB (ref 3.87–5.11)
RDW: 19.5 % — ABNORMAL HIGH (ref 11.5–15.5)
WBC: 4.5 10*3/uL (ref 4.0–10.5)
nRBC: 0 % (ref 0.0–0.2)

## 2018-11-19 LAB — MULTIPLE MYELOMA PANEL, SERUM
Albumin SerPl Elph-Mcnc: 1.9 g/dL — ABNORMAL LOW (ref 2.9–4.4)
Albumin/Glob SerPl: 0.7 (ref 0.7–1.7)
Alpha 1: 0.5 g/dL — ABNORMAL HIGH (ref 0.0–0.4)
Alpha2 Glob SerPl Elph-Mcnc: 0.9 g/dL (ref 0.4–1.0)
B-Globulin SerPl Elph-Mcnc: 0.7 g/dL (ref 0.7–1.3)
Gamma Glob SerPl Elph-Mcnc: 0.8 g/dL (ref 0.4–1.8)
Globulin, Total: 2.9 g/dL (ref 2.2–3.9)
IgA: 211 mg/dL (ref 87–352)
IgG (Immunoglobin G), Serum: 841 mg/dL (ref 700–1600)
IgM (Immunoglobulin M), Srm: 16 mg/dL — ABNORMAL LOW (ref 26–217)
Total Protein ELP: 4.8 g/dL — ABNORMAL LOW (ref 6.0–8.5)

## 2018-11-19 LAB — BASIC METABOLIC PANEL
Anion gap: 10 (ref 5–15)
BUN: 11 mg/dL (ref 6–20)
CO2: 21 mmol/L — ABNORMAL LOW (ref 22–32)
Calcium: 7.2 mg/dL — ABNORMAL LOW (ref 8.9–10.3)
Chloride: 105 mmol/L (ref 98–111)
Creatinine, Ser: 0.63 mg/dL (ref 0.44–1.00)
GFR calc Af Amer: >60 mL/min (ref >60)
GFR calc non Af Amer: >60 mL/min (ref >60)
Glucose, Bld: 103 mg/dL — ABNORMAL HIGH (ref 70–99)
Potassium: 3.3 mmol/L — ABNORMAL LOW (ref 3.5–5.1)
Sodium: 136 mmol/L (ref 135–145)

## 2018-11-19 LAB — HEPATIC FUNCTION PANEL
ALT: 15 U/L (ref 0–44)
AST: 30 U/L (ref 15–41)
Albumin: 1.7 g/dL — ABNORMAL LOW (ref 3.5–5.0)
Alkaline Phosphatase: 154 U/L — ABNORMAL HIGH (ref 38–126)
Bilirubin, Direct: 0.4 mg/dL — ABNORMAL HIGH (ref 0.0–0.2)
Indirect Bilirubin: 0.8 mg/dL (ref 0.3–0.9)
Total Bilirubin: 1.2 mg/dL (ref 0.3–1.2)
Total Protein: 5.1 g/dL — ABNORMAL LOW (ref 6.5–8.1)

## 2018-11-19 LAB — INTERLEUKIN-6, PLASMA: Interleukin-6, Plasma: 148.4 pg/mL — ABNORMAL HIGH (ref 0.0–12.2)

## 2018-11-19 LAB — PREPARE RBC (CROSSMATCH)

## 2018-11-19 MED ORDER — SODIUM CHLORIDE 0.9% FLUSH
10.0000 mL | INTRAVENOUS | Status: DC | PRN
  Administered 2018-12-06 – 2018-12-12 (×3): 10 mL
  Filled 2018-11-19 (×3): qty 40

## 2018-11-19 MED ORDER — SODIUM CHLORIDE 0.9 % IV SOLN: Freq: Once | INTRAVENOUS | Status: DC

## 2018-11-19 MED ORDER — UNJURY CHICKEN SOUP POWDER
8.0000 [oz_av] | Freq: Two times a day (BID) | ORAL | Status: DC
  Administered 2018-11-20 – 2018-11-23 (×5): 8 [oz_av] via ORAL
  Filled 2018-11-19 (×16): qty 27

## 2018-11-19 MED ORDER — SODIUM CHLORIDE 0.9 % IV SOLN
1.2000 mg/kg | Freq: Once | INTRAVENOUS | Status: AC
  Administered 2018-11-19: 90 mg via INTRAVENOUS
  Filled 2018-11-19: qty 18

## 2018-11-19 MED ORDER — DIPHENHYDRAMINE HCL 50 MG/ML IJ SOLN
50.0000 mg | Freq: Once | INTRAMUSCULAR | Status: AC
  Administered 2018-11-19: 50 mg via INTRAVENOUS
  Filled 2018-11-19: qty 1

## 2018-11-19 MED ORDER — SODIUM CHLORIDE 0.9% IV SOLUTION: Freq: Once | INTRAVENOUS | Status: DC

## 2018-11-19 MED ORDER — PALONOSETRON HCL INJECTION 0.25 MG/5ML
0.2500 mg | Freq: Once | INTRAVENOUS | Status: AC
  Administered 2018-11-19: 0.25 mg via INTRAVENOUS
  Filled 2018-11-19: qty 5

## 2018-11-19 MED ORDER — POTASSIUM CHLORIDE CRYS ER 20 MEQ PO TBCR
40.0000 meq | EXTENDED_RELEASE_TABLET | Freq: Once | ORAL | Status: AC
  Administered 2018-11-19: 40 meq via ORAL
  Filled 2018-11-19: qty 2

## 2018-11-19 MED ORDER — VINBLASTINE SULFATE CHEMO INJECTION 1 MG/ML
5.8500 mg/m2 | Freq: Once | INTRAVENOUS | Status: AC
  Administered 2018-11-19: 11 mg via INTRAVENOUS
  Filled 2018-11-19: qty 11

## 2018-11-19 MED ORDER — DOXORUBICIN HCL CHEMO IV INJECTION 2 MG/ML
25.0000 mg/m2 | Freq: Once | INTRAVENOUS | Status: AC
  Administered 2018-11-19: 48 mg via INTRAVENOUS
  Filled 2018-11-19: qty 24

## 2018-11-19 MED ORDER — SODIUM CHLORIDE 0.9 % IV SOLN
Freq: Once | INTRAVENOUS | Status: AC
  Administered 2018-11-19: 13:00:00 via INTRAVENOUS
  Filled 2018-11-19: qty 5

## 2018-11-19 MED ORDER — SODIUM CHLORIDE 0.9 % IV SOLN
375.0000 mg/m2 | Freq: Once | INTRAVENOUS | Status: AC
  Administered 2018-11-19: 710 mg via INTRAVENOUS
  Filled 2018-11-19: qty 71

## 2018-11-19 MED ORDER — LIP MEDEX EX OINT
TOPICAL_OINTMENT | CUTANEOUS | Status: DC | PRN
  Administered 2018-11-19 – 2018-11-22 (×2): via TOPICAL
  Filled 2018-11-19 (×3): qty 7

## 2018-11-19 MED ORDER — ACETAMINOPHEN 325 MG PO TABS
650.0000 mg | ORAL_TABLET | Freq: Once | ORAL | Status: AC
  Administered 2018-11-19: 650 mg via ORAL
  Filled 2018-11-19: qty 2

## 2018-11-19 MED ORDER — TBO-FILGRASTIM 480 MCG/0.8ML ~~LOC~~ SOSY
480.0000 ug | PREFILLED_SYRINGE | Freq: Every day | SUBCUTANEOUS | Status: AC
  Administered 2018-11-20 – 2018-11-24 (×4): 480 ug via SUBCUTANEOUS
  Filled 2018-11-19 (×5): qty 0.8

## 2018-11-19 NOTE — Progress Notes (Signed)
Chemotherapy teaching provided to patient and 'Chemo and You' book provided. Potential signs and symptoms discussed. Pt encouraged to ask questions as they arise.  Consent signed and placed in chart.

## 2018-11-19 NOTE — Progress Notes (Signed)
PROGRESS NOTE  Cindy Ward LEX:[id number] DOB: 28-Oct-1965 DOA: 11/11/2018 PCP: [name], MD  HPI/Recap of past 24 hours: 53 y.o.femalewith medical history significant ofhypertension hypothyroidism presents at [facility] with recurrent nausea, vomiting, fevers, and night sweats of 9 months duration.She notes that the symptoms occur about every 2 weeks. She has nightly fevers and night sweats. The symptoms occur for days at a time, but have been taking longer and longer to resolve.She came to the hospital today because she passed out at the cancer center. She describes getting dizzy and then hitting the floor.  She was seen in the emergency department at [facility]. Her vitals were notable for a fever to 102.7. Multiple enlarged lymph nodes noted on CT abdomen and pelvis. The case was discussed with [name] who recommended transfer to [facility] for further workup as well as LDH, haptoglobin, and retics.  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  Hospital course complicated by recurrent high fevers related to malignancy.  11/18/2018:  Port-A-Cath placed by interventional radiology.    11/19/2018: Patient seen and examined at her bedside.  No acute events overnight.  She has no new complaints.  CT neck, chest, abdomen and pelvis with contrast revealed diffuse lymphadenopathy involving cervical, thoracic and abdominal lymph nodes.  Newly diagnosed with stage IV Hodgkin lymphoma.    Assessment/Plan: Principal Problem:   Hodgkin lymphoma (HCC) Active Problems:   Lymphadenopathy   FUO (fever of unknown origin)   Splenomegaly   Liver enzyme elevation   Thrombocytopenia (HCC)   Unintentional weight loss   Hashimoto's thyroiditis   Goiter   Hypertension   Aortic atherosclerosis (HCC)   Pancytopenia (HCC)   Malnutrition of moderate degree   Normocytic anemia   Newly diagnosed stage IV Hodgkin  lymphoma Presented with lymphadenopathy and persistent fevers with no active infective process Lymph node biopsies done on 11/12/2018 by general surgery showed features consistent with classical Hodgkin lymphoma Bone marrow biopsy done by IR pending for staging CT neck, chest, abdomen and pelvis with contrast revealed diffuse lymphadenopathy involving cervical, thoracic, and abdominal lymph nodes Discussed plan with heme oncology Dr Maylon Peppers Port-A-Cath placed on 11/18/2018 by interventional radiology. Persistent high fevers related to malignancy Heme oncology Dr. Maylon Peppers following  Plan to start chemotherapy today 11/19/2008 Granix x5 days  Malignancy related anemia Hemoglobin dropped to 6.9 No overt sign of bleeding Transfused 2 unit PRBCs Repeat CBC in the morning  Thrombocytopenia, suspect from malignancy Platelet dropped from 100 and 9K to 90 3K No sign of overt bleeding Repeat CBC in the morning Heme oncology following  Persistent fevers secondary to classical Hodgkin lymphoma Symptomatic management with anti-pyretic and IV fluid to prevent dehydration Port-A-Cath in place Chemotherapy planned this week  Splenomegaly suspect secondary to #1 Persistent thrombocytopenia No sign of active bleeding  Malignancy related anemia and thrombocytopenia Hemoglobin 7.2.  Baseline hemoglobin 10 Repeat hemoglobin in the morning transfuse less than 7 Platelet count stable 109K  Persistent hypokalemia Potassium 3.3 from 2.9 Repleted with IV potassium and oral Repeat BMP in the morning  Severe protein calorie malnutrition Albumin 1.7 BMI 25 with muscle mass loss Continue to encourage increase oral protein calorie intake Continue oral supplement  Hyperbilirubinemia, persistent Total bilirubin 1.5 from 1.4 Alkaline phosphatase ALT and AST normal Continue to closely monitor  Small pericardial effusion  2D echo 11/12/18 No evidence of cardiac tamponade  ?  Syncope Suspected secondary  to anemia versus dehydration Continue gentle IV fluid hydration  Hypothyroidism/thyroid goiter Continue Synthroid  Hypertension Blood pressure is at goal Continue Bystolic  Moderate protein calorie malnutrition C/w oral supplement  Encourage increasing oral protein calorie intake Albumin 2.0 on 11/15/2018 BMI 24   DVT prophylaxis:  Restart subcu Lovenox  11/19/2018   Code Status: Full Family Communication:  Updated daughter Anderson Malta on the phone 11/18/2018  Disposition Plan:  Home when heme oncology signs off.  Consultants:   General Surgery  ID  Procedures:  Lymph node biopsies done on 11/12/2018   Objective: Vitals:   11/19/18 1025 11/19/18 1058 11/19/18 1245 11/19/18 1628  BP: 109/75 106/78 120/75 124/76  Pulse: 70 63 76 68  Resp: 16   16  Temp:  97.6 F (36.4 C) 97.6 F (36.4 C) 97.9 F (36.6 C)  TempSrc:  Oral Oral Oral  SpO2: 98% 100% 100% 98%  Weight:      Height:        Intake/Output Summary (Last 24 hours) at 11/19/2018 1822 Last data filed at 11/19/2018 1645 Gross per 24 hour  Intake 2413.18 ml  Output 1100 ml  Net 1313.18 ml   Filed Weights   11/17/18 0500 11/18/18 0500 11/19/18 0441  Weight: 72.9 kg 73.4 kg 75.7 kg    Exam:  . General: 53 y.o. year-old female frail-appearing in no acute distress.  Alert and oriented x3.  . Cardiovascular: Regular rate and rhythm with no rubs or gallops no JVD or thyromegaly. Marland Kitchen Respiratory: Clear to auscultation with no wheezes or rales.  Good inspiratory effort.  Abdomen: Soft nontender mildly distended with normal bowel sounds x4 quadrants. . Musculoskeletal: Trace lower extremity edema. 2/4 pulses in all 4 extremities. Marland Kitchen Psychiatry: Mood is appropriate for condition and setting   Data Reviewed: CBC: Recent Labs  Lab 11/14/18 0308 11/14/18 1009 11/15/18 0557 11/16/18 0605 11/18/18 0354 11/19/18 0540  WBC 3.8*  --  3.7* 3.8* 4.0 4.5  NEUTROABS 2.8  --  2.9  --   --   --   HGB 6.7* 7.9* 7.7*  7.9* 7.2* 6.9*  HCT 20.9* 24.4*  22.4* 24.3* 25.1* 23.7* 22.1*  MCV 84.3  --  86.5 86.9 87.8 86.7  PLT 73*  --  87* 101* 109* 93*   Basic Metabolic Panel: Recent Labs  Lab 11/15/18 0557 11/15/18 1636 11/16/18 0324 11/18/18 0354 11/19/18 0540  NA 134* 134* 134* 137 136  K 2.6* 3.2* 3.9 2.9* 3.3*  CL 97* 97* 99 105 105  CO2 _0 21*  GLUCOSE 112* 134* 85 79 103*  BUN _1 CREATININE 0.77 0.83 0.73 0.68 0.63  CALCIUM 7.7* 7.5* 7.5* 7.3* 7.2*  MG 2.0  --  2.0  --   --   PHOS 1.7*  --  2.4*  --   --    GFR: Estimated Creatinine Clearance: 82 mL/min (by C-G formula based on SCr of 0.63 mg/dL). Liver Function Tests: Recent Labs  Lab 11/13/18 0252 11/15/18 0557 11/19/18 0805  AST 56* 43* 30  ALT 38 24 15  ALKPHOS 83 120 154*  BILITOT 1.4* 1.5* 1.2  PROT 5.8* 5.4* 5.1*  ALBUMIN 2.1* 2.0* 1.7*   No results for input(s): LIPASE, AMYLASE in the last 168 hours. No results for input(s): AMMONIA in the last 168 hours. Coagulation Profile: Recent Labs  Lab 11/18/18 0354  INR 1.65   Cardiac Enzymes: No results for input(s): CKTOTAL, CKMB, CKMBINDEX, TROPONINI in the last 168 hours. BNP (last 3 results) No results for input(s):  PROBNP in the last 8760 hours. HbA1C: No results for input(s): HGBA1C in the last 72 hours. CBG: No results for input(s): GLUCAP in the last 168 hours. Lipid Profile: No results for input(s): CHOL, HDL, LDLCALC, TRIG, CHOLHDL, LDLDIRECT in the last 72 hours. Thyroid Function Tests: No results for input(s): TSH, T4TOTAL, FREET4, T3FREE, THYROIDAB in the last 72 hours. Anemia Panel: No results for input(s): VITAMINB12, FOLATE, FERRITIN, TIBC, IRON, RETICCTPCT in the last 72 hours. Urine analysis:    Component Value Date/Time   COLORURINE YELLOW 11/14/2018 1619   APPEARANCEUR HAZY (A) 11/14/2018 1619   LABSPEC 1.016 11/14/2018 1619   PHURINE 5.0 11/14/2018 1619   GLUCOSEU NEGATIVE 11/14/2018 1619   HGBUR SMALL (A) 11/14/2018  1619   BILIRUBINUR NEGATIVE 11/14/2018 1619   [place] 11/14/2018 1619   PROTEINUR 30 (A) 11/14/2018 1619   NITRITE NEGATIVE 11/14/2018 1619   LEUKOCYTESUR NEGATIVE 11/14/2018 1619   Sepsis Labs: _0 (procalcitonin:4,lacticidven:4)  ) Recent Results (from the past 240 hour(s))  MRSA PCR Screening     Status: None   Collection Time: 11/11/18  4:21 PM  Result Value Ref Range Status   MRSA by PCR NEGATIVE NEGATIVE Final    Comment:        The GeneXpert MRSA Assay (FDA approved for NASAL specimens only), is one component of a comprehensive MRSA colonization surveillance program. It is not intended to diagnose MRSA infection nor to guide or monitor treatment for MRSA infections. Performed at [facility], [place] [address], [place], [place] [address]   Culture, blood (routine x 2)     Status: None   Collection Time: 11/13/18  3:54 PM  Result Value Ref Range Status   Specimen Description   Final    BLOOD RIGHT ANTECUBITAL Performed at [place] [address], [place], [place] [address]    Special Requests   Final    BOTTLES DRAWN AEROBIC AND ANAEROBIC Blood Culture adequate volume Performed at [place] [address] [place], [place] [address]    Culture   Final    NO GROWTH 5 DAYS Performed at [facility], [place] [address] [place], [place] [address]    Report Status 11/18/2018 FINAL  Final  Culture, blood (routine x 2)     Status: None   Collection Time: 11/13/18  4:02 PM  Result Value Ref Range Status   Specimen Description   Final    BLOOD RIGHT FOREARM Performed at [facility], [place] [address], [place], [place] [address]    Special Requests   Final    BOTTLES DRAWN AEROBIC AND ANAEROBIC Blood Culture adequate volume Performed at [place] [address] [place], [place] [address]    Culture   Final    NO GROWTH 5 DAYS Performed at [facility], [place] [address], [place], [place] [address]    Report Status 11/18/2018 FINAL  Final  Culture, Urine     Status: None   Collection Time: 11/14/18  4:20 PM  Result Value Ref Range Status   Specimen Description   Final    URINE, CLEAN CATCH Performed at [facility], [place] [address] [place], [place] [address]    Special Requests   Final    NONE Performed at [facility], [place] [address], [place], [place] [address]    Culture   Final    NO GROWTH Performed at [facility],  [address], [place], [place] [address]    Report Status 11/15/2018 FINAL  Final      Studies: No results found.  Scheduled Meds: . sodium chloride   Intravenous Once  . enoxaparin (LOVENOX) injection  40 mg Subcutaneous Q24H  . feeding supplement  1 Container Oral TID BM  . levothyroxine  75 mcg Oral Q0600  . mouth rinse  15 mL Mouth Rinse BID  . multivitamin with minerals  1 tablet Oral Daily  . protein supplement  8 oz Oral q12n4p  . scopolamine  1 patch Transdermal Q72H  . [START ON 11/20/2018] Tbo-filgastrim (GRANIX) SQ  480 mcg Subcutaneous q1800    Continuous Infusions: . sodium chloride 250 mL (11/19/18 1356)  . sodium chloride 75 mL/hr at 11/19/18 1711  . sodium chloride       LOS: 8 days     Kayleen Memos, MD [facility] Pager [phone]  If 7PM-7AM, please contact night-coverage [email or web address] Password [facility] 11/19/2018, 6:22 PM

## 2018-11-19 NOTE — Progress Notes (Signed)
Ok to treat with low hgb.  Pt to receive PRBC.  [place] of [address].  Add granix 480 x 5 days starting tomorrow. Per Dr Maylon Peppers.

## 2018-11-19 NOTE — Progress Notes (Addendum)
Cindy Ward   DOB:1966-08-01   WI#:[id number]    HEME/ONC PROBLEMS: 1. 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, [facility]) -10/2018: transferred to [facility]; excisional LN biopsy showed classical Hodgkin lymphoma, mixed cellularity type; bone marrow bx pending; presumed Stage IV due to pancytopenia; CT CAP showed diffuse lymphadenopathy involving cervical, thoracic and abdominal LN's  2. Port placement in 10/2018  Assessment & Plan:   Stage IV Hodgkin lymphoma -Port placed in 10/2018; TTE showed normal LVEF -I reinforced some of the benefits and potential toxicities with the patient again this morning; patient expressed understanding and agreed to proceed with chemotherapy -I have spoken with pharmacy and confirmed the plan to begin AVD + Adcedris today; plan for Granix x 5 days, starting the day after chemotherapy -Recommend PRN anti-emetics, including Zofran and Compazine  Normocytic anemia -Likely secondary to anemia of chronic disease and underlying lymphoma -1 unit RBC pending today for Hgb of 6.9 -Supportive transfusion to keep Hgb > 7  Thrombocytopenia -Likely secondary to underlying lymphoma -Patient denies any symptoms of bleeding -Daily CBC -Goal plts > 10k in the absence of bleeding   Persistent fever -Secondary to underlying lymphoma -Patient has undergone extensive infectious work-up that did not identify any infection -Continue to monitor it for now   Severe protein malnutrition -Patient has lost > 20 lbs since the onset of her symptoms, and her most recent albumin was 1.7 with anasarca, consistent with severe protein malnutrition -I recommend nutritional consult to help improve the patient's nutrition -If her nutrition remains very poor and she is unable to increase her PO intake, we will have to consider enteral  nutrition, such as PEG tube, to help improve her nutritional intake  Patient's daughter, Cindy Ward, [phone], requests to have periodic updates on the status of her mother's treatment.   A total of more than 35 minutes were spent face-to-face with the patient during this visit and over half of that time was spent on counseling and coordination of care as outlined above.  Thank you for the opportunity to care for Cindy Ward. Please do not hesitate to contact me if there are any questions.   Tish Men, MD 11/19/2018  8:56 AM  Subjective:  Cindy Ward reports that she has persistent fatigue, but otherwise denies any other new complaint today.  She denies any symptoms of bleeding.  She has some questions regarding the side effect of chemotherapy, which we discussed at length today.  She indicates that she is ready to start chemotherapy today.  ROS: Constitutional: ( - ) fevers, ( - )  chills , ( - ) night sweats Ears, nose, mouth, throat, and face: ( - ) mucositis, ( - ) sore throat Respiratory: ( - ) cough, ( - ) dyspnea, ( - ) wheezes Cardiovascular: ( - ) palpitation, ( - ) chest discomfort, ( - ) lower extremity swelling Gastrointestinal:  ( - ) nausea, ( - ) heartburn, ( - ) change in bowel habits Skin: ( - ) abnormal skin rashes Behavioral/Psych: ( - ) mood change, ( - ) new changes  All other systems were reviewed with the patient and are negative.  Objective:  Vitals:   11/19/18 0319 11/19/18 0441  BP: 106/69 109/66  Pulse: 68 63  Resp: 16 16  Temp: (!) 97.3 F (36.3 C)   SpO2: 99% 99%     Intake/Output Summary (Last 24 hours) at 11/19/2018  9629 Last data filed at 11/19/2018 5284 Gross per 24 hour  Intake 1227.97 ml  Output 600 ml  Net 627.97 ml    GENERAL: alert, no distress, fatigued appearing  SKIN: skin color, texture, turgor are normal, no rashes or significant lesions EYES: conjunctiva are pink and non-injected, sclera clear OROPHARYNX: no exudate, no erythema;  lips, buccal mucosa, and tongue normal  NECK: mild persistent cervical adenopathy, L > R  LUNGS: clear to auscultation with normal breathing effort HEART: regular rate & rhythm and no murmurs and no lower extremity edema ABDOMEN: soft, non-tender, non-distended, normal bowel sounds PSYCH: alert & oriented x 3, slightly slowed speech   Labs:  Lab Results  Component Value Date   WBC 4.5 11/19/2018   HGB 6.9 (LL) 11/19/2018   HCT 22.1 (L) 11/19/2018   MCV 86.7 11/19/2018   PLT 93 (L) 11/19/2018   NEUTROABS 2.9 11/15/2018    Lab Results  Component Value Date   NA 136 11/19/2018   K 3.3 (L) 11/19/2018   CL 105 11/19/2018   CO2 21 (L) 11/19/2018    Studies:  Ct Soft Tissue Neck W Contrast  Result Date: 11/18/2018 CLINICAL DATA:  Initial evaluation for Hodgkin's lymphoma, initial workup. EXAM: CT NECK WITH CONTRAST CT CHEST, ABDOMEN, AND PELVIS WITH CONTRAST TECHNIQUE: Multidetector CT imaging of the chest, abdomen and pelvis was performed following the standard protocol during bolus administration of intravenous contrast. CONTRAST:  123mL OMNIPAQUE IOHEXOL 300 MG/ML SOLN, 69mL OMNIPAQUE IOHEXOL 300 MG/ML SOLN COMPARISON:  None. FINDINGS: CT NECK FINDINGS: Pharynx and larynx: Oral cavity within normal limits without mass lesion or loculated collection. Patient is largely edentulous. Calcified tonsilliths noted within the right palatine tonsil. Tonsils themselves symmetric and within normal limits bilaterally. Parapharyngeal fat maintained. Nasopharynx normal. No retropharyngeal collection. Epiglottis normal. Vallecula clear. Remainder of the hypopharynx and supraglottic larynx normal. Glottis within normal limits. Subglottic airway clear. Salivary glands: Parotid and submandibular glands within normal limits. Thyroid: Thyroid diffusely enlarged without discrete nodule or mass. Lymph nodes: Mildly prominent level II lymph nodes measure up to 9 mm in short axis bilaterally. Left level III nodes  measure up to 9 mm as well. Increased number of shotty subcentimeter nodes within the neck bilaterally, left slightly worse than right. No other pathologically enlarged lymph nodes identified within the neck. Vascular: Normal intravascular enhancement seen throughout the neck. Right IJ approach central venous catheter in place. Soft tissue swelling with stranding and scattered foci of soft tissue emphysema within the right neck likely related to central line placement. Limited intracranial: Unremarkable Visualized orbits: Partially visualized inferior globes and orbits unremarkable. Mastoids and visualized paranasal sinuses: Mucosal thickening noted within the visualized maxillary and sphenoid sinuses. Visualized paranasal sinuses are otherwise clear. Visualize mastoids and middle ear cavities are clear. Skeleton: No acute osseous abnormality. No discrete lytic or blastic osseous lesions. Mild cervical spondylolysis present at C5-6. Other: None. CT CHEST FINDINGS Cardiovascular: Normal intravascular enhancement seen throughout the intra-abdominal aorta without aneurysm or other acute abnormality. Minimal atheromatous plaque within the aortic arch. Visualized great vessels within normal limits. Heart size normal. Small moderate pericardial effusion, simple fluid density. Mediastinum/Nodes: Enlarged nodes within the upper mediastinum measure up to 15 mm in short axis (series 3, image 11). No appreciable supraclavicular adenopathy. No other pathologically enlarged mediastinal lymph nodes. Mild soft tissue density within the right hilum without frank adenopathy. Mildly enlarged 11 mm left hilar node (series 3, image 28). Enlarged axillary adenopathy seen bilaterally, with the largest node  on the left measuring 2.1 cm in short axis (series 3, image 16). Largest node on the right measures 1.7 cm in short axis (series 3, image 8). Scattered soft tissue stranding with emphysema and fluid density overlying the left axilla  likely related to recent lymph node biopsy, partially visualized. Esophagus within normal limits. Lungs/Pleura: Tracheobronchial tree intact and patent. Moderate layering bilateral pleural effusions with associated atelectasis. No other airspace consolidation. No pulmonary edema. No pneumothorax. 4 mm subpleural ground-glass nodule present at the anterior right upper lobe (series 4, image 57), indeterminate. No other worrisome pulmonary nodule or mass. Musculoskeletal: No acute osseous finding. No discrete lytic or blastic osseous lesions. CT ABDOMEN PELVIS FINDINGS Hepatobiliary: Liver demonstrates a normal contrast enhanced appearance. Gallbladder within normal limits. No biliary dilatation. Pancreas: Pancreas within normal limits. Spleen: Spleen enlarged measuring 14.3 cm in craniocaudad dimension. Few subtle hypodensities noted within the spleen, largest of which measures approximately 12 mm (series 3, image 57), indeterminate. Adrenals/Urinary Tract: Adrenal glands are normal. Kidneys equal in size with symmetric enhancement. No nephrolithiasis, hydronephrosis, or focal enhancing renal mass. No hydroureter. Bladder within normal limits. Stomach/Bowel: Stomach within normal limits. No evidence for bowel obstruction. Normal appendix. No acute inflammatory changes seen about the bowels. Vascular/Lymphatic: Normal intravascular enhancement seen throughout the intra-abdominal aorta. Mild aorto bi-iliac atherosclerotic disease. No aneurysm. Mesenteric vessels patent proximally. Enlarged 15 mm aortocaval lymph node (series 3, image 7). Additional multifocal shotty subcentimeter aortocaval and periaortic lymph nodes noted. No other pathologically enlarged intra-abdominal lymph nodes identified. Mildly prominent 12 mm left inguinal lymph nodes noted. No other adenopathy within the pelvis. Reproductive: Uterus within normal limits.  Ovaries normal. Other: Moderate volume free fluid within the pelvis, measuring simple  fluid density. No free intraperitoneal air. Musculoskeletal: Scattered anasarca noted within the external soft tissues. No acute osseous finding. No discrete lytic or blastic osseous lesions. IMPRESSION: CT NECK IMPRESSION 1. Increased number of shoddy subcentimeter lymph nodes throughout the neck bilaterally, left slightly worse than right. No pathologically enlarged lymph nodes identified. 2. Mild diffuse thyromegaly without discrete thyroid nodule or mass. 3. Mild soft tissue stranding with emphysema within the right lateral neck related to recent right-sided Port-A-Cath placement. CT CHEST IMPRESSION 1. Enlarged bilateral axillary and upper mediastinal adenopathy as above, likely related to provided history of lymphoma. Additional borderline enlarged left hilar node may be related to lymphoma or possibly reactive in nature. Attention at follow-up recommended. 2. Moderate layering bilateral pleural effusions with associated atelectasis. 3. Small to moderate pericardial effusion. 4. 4 mm right upper lobe ground-glass nodule, indeterminate. Attention at follow-up recommended. CT ABDOMEN AND PELVIS IMPRESSION 1. Enlarged 15 mm aortocaval lymph node with mildly enlarged 12 mm left inguinal lymph nodes. Additional increased number of shotty subcentimeter retroperitoneal lymph nodes. Findings most likely related to history of lymphoma. 2. Splenomegaly. 3. Moderate volume free fluid within the pelvis, of uncertain etiology, but could be physiologic and/or related overall volume status. 4. Mild diffuse anasarca. Electronically Signed   By: Jeannine Boga M.D.   On: 11/18/2018 19:56   Ct Chest W Contrast  Result Date: 11/18/2018 CLINICAL DATA:  Initial evaluation for Hodgkin's lymphoma, initial workup. EXAM: CT NECK WITH CONTRAST CT CHEST, ABDOMEN, AND PELVIS WITH CONTRAST TECHNIQUE: Multidetector CT imaging of the chest, abdomen and pelvis was performed following the standard protocol during bolus  administration of intravenous contrast. CONTRAST:  165mL OMNIPAQUE IOHEXOL 300 MG/ML SOLN, 63mL OMNIPAQUE IOHEXOL 300 MG/ML SOLN COMPARISON:  None. FINDINGS: CT NECK FINDINGS: Pharynx and  larynx: Oral cavity within normal limits without mass lesion or loculated collection. Patient is largely edentulous. Calcified tonsilliths noted within the right palatine tonsil. Tonsils themselves symmetric and within normal limits bilaterally. Parapharyngeal fat maintained. Nasopharynx normal. No retropharyngeal collection. Epiglottis normal. Vallecula clear. Remainder of the hypopharynx and supraglottic larynx normal. Glottis within normal limits. Subglottic airway clear. Salivary glands: Parotid and submandibular glands within normal limits. Thyroid: Thyroid diffusely enlarged without discrete nodule or mass. Lymph nodes: Mildly prominent level II lymph nodes measure up to 9 mm in short axis bilaterally. Left level III nodes measure up to 9 mm as well. Increased number of shotty subcentimeter nodes within the neck bilaterally, left slightly worse than right. No other pathologically enlarged lymph nodes identified within the neck. Vascular: Normal intravascular enhancement seen throughout the neck. Right IJ approach central venous catheter in place. Soft tissue swelling with stranding and scattered foci of soft tissue emphysema within the right neck likely related to central line placement. Limited intracranial: Unremarkable Visualized orbits: Partially visualized inferior globes and orbits unremarkable. Mastoids and visualized paranasal sinuses: Mucosal thickening noted within the visualized maxillary and sphenoid sinuses. Visualized paranasal sinuses are otherwise clear. Visualize mastoids and middle ear cavities are clear. Skeleton: No acute osseous abnormality. No discrete lytic or blastic osseous lesions. Mild cervical spondylolysis present at C5-6. Other: None. CT CHEST FINDINGS Cardiovascular: Normal intravascular  enhancement seen throughout the intra-abdominal aorta without aneurysm or other acute abnormality. Minimal atheromatous plaque within the aortic arch. Visualized great vessels within normal limits. Heart size normal. Small moderate pericardial effusion, simple fluid density. Mediastinum/Nodes: Enlarged nodes within the upper mediastinum measure up to 15 mm in short axis (series 3, image 11). No appreciable supraclavicular adenopathy. No other pathologically enlarged mediastinal lymph nodes. Mild soft tissue density within the right hilum without frank adenopathy. Mildly enlarged 11 mm left hilar node (series 3, image 28). Enlarged axillary adenopathy seen bilaterally, with the largest node on the left measuring 2.1 cm in short axis (series 3, image 16). Largest node on the right measures 1.7 cm in short axis (series 3, image 8). Scattered soft tissue stranding with emphysema and fluid density overlying the left axilla likely related to recent lymph node biopsy, partially visualized. Esophagus within normal limits. Lungs/Pleura: Tracheobronchial tree intact and patent. Moderate layering bilateral pleural effusions with associated atelectasis. No other airspace consolidation. No pulmonary edema. No pneumothorax. 4 mm subpleural ground-glass nodule present at the anterior right upper lobe (series 4, image 57), indeterminate. No other worrisome pulmonary nodule or mass. Musculoskeletal: No acute osseous finding. No discrete lytic or blastic osseous lesions. CT ABDOMEN PELVIS FINDINGS Hepatobiliary: Liver demonstrates a normal contrast enhanced appearance. Gallbladder within normal limits. No biliary dilatation. Pancreas: Pancreas within normal limits. Spleen: Spleen enlarged measuring 14.3 cm in craniocaudad dimension. Few subtle hypodensities noted within the spleen, largest of which measures approximately 12 mm (series 3, image 57), indeterminate. Adrenals/Urinary Tract: Adrenal glands are normal. Kidneys equal in  size with symmetric enhancement. No nephrolithiasis, hydronephrosis, or focal enhancing renal mass. No hydroureter. Bladder within normal limits. Stomach/Bowel: Stomach within normal limits. No evidence for bowel obstruction. Normal appendix. No acute inflammatory changes seen about the bowels. Vascular/Lymphatic: Normal intravascular enhancement seen throughout the intra-abdominal aorta. Mild aorto bi-iliac atherosclerotic disease. No aneurysm. Mesenteric vessels patent proximally. Enlarged 15 mm aortocaval lymph node (series 3, image 7). Additional multifocal shotty subcentimeter aortocaval and periaortic lymph nodes noted. No other pathologically enlarged intra-abdominal lymph nodes identified. Mildly prominent 12 mm left inguinal lymph  nodes noted. No other adenopathy within the pelvis. Reproductive: Uterus within normal limits.  Ovaries normal. Other: Moderate volume free fluid within the pelvis, measuring simple fluid density. No free intraperitoneal air. Musculoskeletal: Scattered anasarca noted within the external soft tissues. No acute osseous finding. No discrete lytic or blastic osseous lesions. IMPRESSION: CT NECK IMPRESSION 1. Increased number of shoddy subcentimeter lymph nodes throughout the neck bilaterally, left slightly worse than right. No pathologically enlarged lymph nodes identified. 2. Mild diffuse thyromegaly without discrete thyroid nodule or mass. 3. Mild soft tissue stranding with emphysema within the right lateral neck related to recent right-sided Port-A-Cath placement. CT CHEST IMPRESSION 1. Enlarged bilateral axillary and upper mediastinal adenopathy as above, likely related to provided history of lymphoma. Additional borderline enlarged left hilar node may be related to lymphoma or possibly reactive in nature. Attention at follow-up recommended. 2. Moderate layering bilateral pleural effusions with associated atelectasis. 3. Small to moderate pericardial effusion. 4. 4 mm right upper  lobe ground-glass nodule, indeterminate. Attention at follow-up recommended. CT ABDOMEN AND PELVIS IMPRESSION 1. Enlarged 15 mm aortocaval lymph node with mildly enlarged 12 mm left inguinal lymph nodes. Additional increased number of shotty subcentimeter retroperitoneal lymph nodes. Findings most likely related to history of lymphoma. 2. Splenomegaly. 3. Moderate volume free fluid within the pelvis, of uncertain etiology, but could be physiologic and/or related overall volume status. 4. Mild diffuse anasarca. Electronically Signed   By: Jeannine Boga M.D.   On: 11/18/2018 19:56   Ct Abdomen Pelvis W Contrast  Result Date: 11/18/2018 CLINICAL DATA:  Initial evaluation for Hodgkin's lymphoma, initial workup. EXAM: CT NECK WITH CONTRAST CT CHEST, ABDOMEN, AND PELVIS WITH CONTRAST TECHNIQUE: Multidetector CT imaging of the chest, abdomen and pelvis was performed following the standard protocol during bolus administration of intravenous contrast. CONTRAST:  184mL OMNIPAQUE IOHEXOL 300 MG/ML SOLN, 4mL OMNIPAQUE IOHEXOL 300 MG/ML SOLN COMPARISON:  None. FINDINGS: CT NECK FINDINGS: Pharynx and larynx: Oral cavity within normal limits without mass lesion or loculated collection. Patient is largely edentulous. Calcified tonsilliths noted within the right palatine tonsil. Tonsils themselves symmetric and within normal limits bilaterally. Parapharyngeal fat maintained. Nasopharynx normal. No retropharyngeal collection. Epiglottis normal. Vallecula clear. Remainder of the hypopharynx and supraglottic larynx normal. Glottis within normal limits. Subglottic airway clear. Salivary glands: Parotid and submandibular glands within normal limits. Thyroid: Thyroid diffusely enlarged without discrete nodule or mass. Lymph nodes: Mildly prominent level II lymph nodes measure up to 9 mm in short axis bilaterally. Left level III nodes measure up to 9 mm as well. Increased number of shotty subcentimeter nodes within the neck  bilaterally, left slightly worse than right. No other pathologically enlarged lymph nodes identified within the neck. Vascular: Normal intravascular enhancement seen throughout the neck. Right IJ approach central venous catheter in place. Soft tissue swelling with stranding and scattered foci of soft tissue emphysema within the right neck likely related to central line placement. Limited intracranial: Unremarkable Visualized orbits: Partially visualized inferior globes and orbits unremarkable. Mastoids and visualized paranasal sinuses: Mucosal thickening noted within the visualized maxillary and sphenoid sinuses. Visualized paranasal sinuses are otherwise clear. Visualize mastoids and middle ear cavities are clear. Skeleton: No acute osseous abnormality. No discrete lytic or blastic osseous lesions. Mild cervical spondylolysis present at C5-6. Other: None. CT CHEST FINDINGS Cardiovascular: Normal intravascular enhancement seen throughout the intra-abdominal aorta without aneurysm or other acute abnormality. Minimal atheromatous plaque within the aortic arch. Visualized great vessels within normal limits. Heart size normal. Small moderate pericardial effusion,  simple fluid density. Mediastinum/Nodes: Enlarged nodes within the upper mediastinum measure up to 15 mm in short axis (series 3, image 11). No appreciable supraclavicular adenopathy. No other pathologically enlarged mediastinal lymph nodes. Mild soft tissue density within the right hilum without frank adenopathy. Mildly enlarged 11 mm left hilar node (series 3, image 28). Enlarged axillary adenopathy seen bilaterally, with the largest node on the left measuring 2.1 cm in short axis (series 3, image 16). Largest node on the right measures 1.7 cm in short axis (series 3, image 8). Scattered soft tissue stranding with emphysema and fluid density overlying the left axilla likely related to recent lymph node biopsy, partially visualized. Esophagus within normal  limits. Lungs/Pleura: Tracheobronchial tree intact and patent. Moderate layering bilateral pleural effusions with associated atelectasis. No other airspace consolidation. No pulmonary edema. No pneumothorax. 4 mm subpleural ground-glass nodule present at the anterior right upper lobe (series 4, image 57), indeterminate. No other worrisome pulmonary nodule or mass. Musculoskeletal: No acute osseous finding. No discrete lytic or blastic osseous lesions. CT ABDOMEN PELVIS FINDINGS Hepatobiliary: Liver demonstrates a normal contrast enhanced appearance. Gallbladder within normal limits. No biliary dilatation. Pancreas: Pancreas within normal limits. Spleen: Spleen enlarged measuring 14.3 cm in craniocaudad dimension. Few subtle hypodensities noted within the spleen, largest of which measures approximately 12 mm (series 3, image 57), indeterminate. Adrenals/Urinary Tract: Adrenal glands are normal. Kidneys equal in size with symmetric enhancement. No nephrolithiasis, hydronephrosis, or focal enhancing renal mass. No hydroureter. Bladder within normal limits. Stomach/Bowel: Stomach within normal limits. No evidence for bowel obstruction. Normal appendix. No acute inflammatory changes seen about the bowels. Vascular/Lymphatic: Normal intravascular enhancement seen throughout the intra-abdominal aorta. Mild aorto bi-iliac atherosclerotic disease. No aneurysm. Mesenteric vessels patent proximally. Enlarged 15 mm aortocaval lymph node (series 3, image 7). Additional multifocal shotty subcentimeter aortocaval and periaortic lymph nodes noted. No other pathologically enlarged intra-abdominal lymph nodes identified. Mildly prominent 12 mm left inguinal lymph nodes noted. No other adenopathy within the pelvis. Reproductive: Uterus within normal limits.  Ovaries normal. Other: Moderate volume free fluid within the pelvis, measuring simple fluid density. No free intraperitoneal air. Musculoskeletal: Scattered anasarca noted within  the external soft tissues. No acute osseous finding. No discrete lytic or blastic osseous lesions. IMPRESSION: CT NECK IMPRESSION 1. Increased number of shoddy subcentimeter lymph nodes throughout the neck bilaterally, left slightly worse than right. No pathologically enlarged lymph nodes identified. 2. Mild diffuse thyromegaly without discrete thyroid nodule or mass. 3. Mild soft tissue stranding with emphysema within the right lateral neck related to recent right-sided Port-A-Cath placement. CT CHEST IMPRESSION 1. Enlarged bilateral axillary and upper mediastinal adenopathy as above, likely related to provided history of lymphoma. Additional borderline enlarged left hilar node may be related to lymphoma or possibly reactive in nature. Attention at follow-up recommended. 2. Moderate layering bilateral pleural effusions with associated atelectasis. 3. Small to moderate pericardial effusion. 4. 4 mm right upper lobe ground-glass nodule, indeterminate. Attention at follow-up recommended. CT ABDOMEN AND PELVIS IMPRESSION 1. Enlarged 15 mm aortocaval lymph node with mildly enlarged 12 mm left inguinal lymph nodes. Additional increased number of shotty subcentimeter retroperitoneal lymph nodes. Findings most likely related to history of lymphoma. 2. Splenomegaly. 3. Moderate volume free fluid within the pelvis, of uncertain etiology, but could be physiologic and/or related overall volume status. 4. Mild diffuse anasarca. Electronically Signed   By: Jeannine Boga M.D.   On: 11/18/2018 19:56   Ir Imaging Guided Port Insertion  Result Date: 11/18/2018 INDICATION: 53 year old female  with history lymphoma EXAM: IMPLANTED PORT A CATH PLACEMENT WITH ULTRASOUND AND FLUOROSCOPIC GUIDANCE MEDICATIONS: 2.0 g Ancef; The antibiotic was administered within an appropriate time interval prior to skin puncture. ANESTHESIA/SEDATION: Moderate (conscious) sedation was employed during this procedure. A total of Versed 2.0 mg and  Fentanyl 100 mcg was administered intravenously. Moderate Sedation Time: 18 minutes. The patient's level of consciousness and vital signs were monitored continuously by radiology nursing throughout the procedure under my direct supervision. FLUOROSCOPY TIME:  0 minutes, 12 seconds (1.0 mGy) COMPLICATIONS: None PROCEDURE: The procedure, risks, benefits, and alternatives were explained to the patient. Questions regarding the procedure were encouraged and answered. The patient understands and consents to the procedure. Ultrasound survey was performed with images stored and sent to PACs. The right neck and chest was prepped with chlorhexidine, and draped in the usual sterile fashion using maximum barrier technique (cap and mask, sterile gown, sterile gloves, large sterile sheet, hand hygiene and cutaneous antiseptic). Antibiotic prophylaxis was provided with 2.0g Ancef administered IV one hour prior to skin incision. Local anesthesia was attained by infiltration with 1% lidocaine without epinephrine. Ultrasound demonstrated patency of the right internal jugular vein, and this was documented with an image. Under real-time ultrasound guidance, this vein was accessed with a 21 gauge micropuncture needle and image documentation was performed. A small dermatotomy was made at the access site with an 11 scalpel. A 0.018" wire was advanced into the SVC and used to estimate the length of the internal catheter. The access needle exchanged for a 83F micropuncture vascular sheath. The 0.018" wire was then removed and a 0.035" wire advanced into the IVC. An appropriate location for the subcutaneous reservoir was selected below the clavicle and an incision was made through the skin and underlying soft tissues. The subcutaneous tissues were then dissected using a combination of blunt and sharp surgical technique and a pocket was formed. A single lumen power injectable portacatheter was then tunneled through the subcutaneous tissues  from the pocket to the dermatotomy and the port reservoir placed within the subcutaneous pocket. The venous access site was then serially dilated and a peel away vascular sheath placed over the wire. The wire was removed and the port catheter advanced into position under fluoroscopic guidance. The catheter tip is positioned in the cavoatrial junction. This was documented with a spot image. The portacatheter was then tested and found to flush and aspirate well. The port was flushed with saline followed by 100 units/mL heparinized saline. The pocket was then closed in two layers using first subdermal inverted interrupted absorbable sutures followed by a running subcuticular suture. The epidermis was then sealed with Dermabond. The dermatotomy at the venous access site was also seal with Dermabond. Patient tolerated the procedure well and remained hemodynamically stable throughout. No complications encountered and no significant blood loss encountered IMPRESSION: Status post right IJ port catheter placement. Catheter ready for use. Signed, Dulcy Fanny. Dellia Nims, RPVI [facility] Radiology Electronically Signed   By: Corrie Mckusick D.O.   On: 11/18/2018 11:23

## 2018-11-19 NOTE — Progress Notes (Addendum)
Nutrition Follow-up  DOCUMENTATION CODES:   Non-severe (moderate) malnutrition in context of chronic illness  INTERVENTION:   -Continue Boost Breeze po TID, each supplement provides 250 kcal and 9 grams of protein but try mixed with ginger ale or Sprite -D/c Magic cups -Trial Unjury Chicken soup BID, each provides 100 kcal and 21g protein  -May need to consider appetite stimulant if appetite does not improve  NUTRITION DIAGNOSIS:   Moderate Malnutrition related to chronic illness, nausea, vomiting, other (see comment)(night sweats) as evidenced by mild fat depletion, mild muscle depletion, moderate muscle depletion.  Ongoing.  GOAL:   Patient will meet greater than or equal to 90% of their needs  Not meeting.  MONITOR:   PO intake, Supplement acceptance, Weight trends, Labs, I & O's  REASON FOR ASSESSMENT:   Consult Assessment of nutrition requirement/status  ASSESSMENT:   53 y.o. female with medical history significant of HTN and hypothyroidism. She presented at [facility] with recurrent N/V, fevers, and night sweats of 9 months duration. She notes that the symptoms occur about every 2 weeks. She has nightly fevers and night sweats.  The symptoms occur for days at a time, but have been taking longer and longer to resolve. She presented to the ED after getting dizzy and passing out at the [facility]. She was noted to have a fever of 102.7. Multiple enlarged lymph nodes noted on CT abdomen and pelvis.  Patient has not been eating well since initial assessment on 2/17. Following  Excisional LN biopsy pt was diagnosed with Hodgkin lymphoma.  Patient attempting to eat lunch of pinto beans. Pt states she is drinking water and Sprite and does not plan to eat other items on tray. Pt did not eat any breakfast, states she was not hungry. Pt does not like Magic cups, will d/c. Pt states she is not fond of Boost Breeze taste. Will trial Unjury Chicken Soup for a savory option.  Encouraged pt to try mixing Boost Breeze with juice or soda like ginger ale and Sprite. Per RN in room, staff is trying to encourage pt to eat. Pt may benefit from an appetite stimulant if appetite does not improve.   Weight is currently + 20 lb since admission on 2/17. Medications:Multivitamin with minerals daily,  K-DUR tablet once Labs reviewed: Low K  Diet Order:   Diet Order            Diet regular Room service appropriate? Yes; Fluid consistency: Thin  Diet effective now              EDUCATION NEEDS:   Education needs have been addressed  Skin:  Skin Assessment: Skin Integrity Issues: Skin Integrity Issues:: Incisions Incisions: L axilla (2/18)  Last BM:  2/18  Height:   Ht Readings from Last 1 Encounters:  11/11/18 5\' 8"  (1.727 m)    Weight:   Wt Readings from Last 1 Encounters:  11/19/18 75.7 kg    Ideal Body Weight:  63.64 kg  BMI:  Body mass index is 25.36 kg/m.  Estimated Nutritional Needs:   Kcal:  2100-2300 kcal  Protein:  100-115 grams  Fluid:  >/= 2.1 L/day  Clayton Bibles, MS, RD, LDN [place] Dietitian Pager: [phone] After Hours Pager: [phone]

## 2018-11-19 NOTE — Evaluation (Signed)
Physical Therapy Evaluation Patient Details Name: Cindy Ward MRN: [id number] DOB: Aug 10, 1966 Today's Date: 11/19/2018   History of Present Illness  53 yo female admitted to ED on 11/11/18 for 9 month history of N/V, fatigue, and weight loss. Pt previously followed in [place], oncology reports inconclusive findings. 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 Pt with R iliac BM aspiration and core, awaiting results. Other PMH includes HTN, hypothyroidism.  Clinical Impression   Pt presents with generalized deconditioning with noticeable proximal weakness, increased time and effort to perform mobility tasks, difficulty ambulating with scissoring of gait and LOB recovered by PT, dyspnea on exertion, and decreased activity tolerance. Pt to benefit from acute PT to address deficits. Pt ambulated 15 ft in room with min assist, had one significant LOB with scissoring of gait requiring PT intervention to correct. PT recommending ST-SNF placement for safety and to return to PLOF. Pt with lack of social support per pt's report. PT to progress mobility as tolerated, and will continue to follow acutely.      Follow Up Recommendations Supervision for mobility/OOB;SNF    Equipment Recommendations  None recommended by PT    Recommendations for Other Services       Precautions / Restrictions Precautions Precautions: Fall Restrictions Weight Bearing Restrictions: Yes LUE Weight Bearing: (do not lift over 10 lbs for 2-3 weeks, where biopsy was performed)      Mobility  Bed Mobility Overal bed mobility: Needs Assistance Bed Mobility: Supine to Sit     Supine to sit: Min guard;HOB elevated     General bed mobility comments: Min guard for safety, use of bedrails and HOB elevation to get to EOB.   Transfers Overall transfer level: Needs assistance Equipment used: None Transfers: Sit to/from Stand Sit to Stand: Min guard;From elevated  surface         General transfer comment: Min guard for safety. Pt slow to rise and walked up LEs with UEs suspect due to proximal weakness. Pt with dyspnea 2/4 with minimal exertion, sats 99% and HR 95 bpm.   Ambulation/Gait Ambulation/Gait assistance: Min assist Gait Distance (Feet): 15 Feet Assistive device: None Gait Pattern/deviations: Step-through pattern;Decreased stride length;Scissoring;Narrow base of support Gait velocity: decr   General Gait Details: Min assist for steadying and recovering LOB during ambulation. Pt scissoring LEs with turning and required PT intervention to regain balance. Pt with 2/4 dyspnea throughout.   Stairs            Wheelchair Mobility    Modified Rankin (Stroke Patients Only)       Balance Overall balance assessment: Needs assistance;History of Falls Sitting-balance support: No upper extremity supported Sitting balance-Leahy Scale: Good     Standing balance support: No upper extremity supported;During functional activity Standing balance-Leahy Scale: Fair Standing balance comment: unable to accept even minimal challenge                              Pertinent Vitals/Pain Pain Assessment: No/denies pain    Home Living Family/patient expects to be discharged to:: Private residence Living Arrangements: Alone Available Help at Discharge: Family;Available PRN/intermittently(Pt's daughter lives closeby, and can assist infrequently as she runs two businesses) Type of Home: House Home Access: Stairs to enter Entrance Stairs-Rails: Right Entrance Stairs-Number of Steps: 3 Home Layout: One level Home Equipment: None      Prior Function Level of Independence: Independent  Comments: Pt reports doing everything for self PTA. Pt was in [place] [facility] prior to admission to [facility], and reports one fall while there.      Hand Dominance        Extremity/Trunk Assessment   Upper Extremity Assessment Upper  Extremity Assessment: Overall WFL for tasks assessed    Lower Extremity Assessment Lower Extremity Assessment: Generalized weakness(able to perform active hip and knee flexion/extension as well as SLR in supine, LE weakness evidenced in WB)    Cervical / Trunk Assessment Cervical / Trunk Assessment: Normal  Communication   Communication: No difficulties  Cognition Arousal/Alertness: Awake/alert Behavior During Therapy: Flat affect;WFL for tasks assessed/performed Overall Cognitive Status: Within Functional Limits for tasks assessed                                        General Comments      Exercises     Assessment/Plan    PT Assessment Patient needs continued PT services  PT Problem List Decreased strength;Decreased range of motion;Decreased activity tolerance;Decreased balance;Decreased safety awareness;Decreased mobility       PT Treatment Interventions DME instruction;Therapeutic activities;Gait training;Therapeutic exercise;Patient/family education;Balance training;Functional mobility training    PT Goals (Current goals can be found in the Care Plan section)  Acute Rehab PT Goals Patient Stated Goal: feel stronger  PT Goal Formulation: With patient Time For Goal Achievement: 12/03/18 Potential to Achieve Goals: Good    Frequency Min 2X/week   Barriers to discharge        Co-evaluation               AM-PAC PT "6 Clicks" Mobility  Outcome Measure Help needed turning from your back to your side while in a flat bed without using bedrails?: A Little Help needed moving from lying on your back to sitting on the side of a flat bed without using bedrails?: A Little Help needed moving to and from a bed to a chair (including a wheelchair)?: A Little Help needed standing up from a chair using your arms (e.g., wheelchair or bedside chair)?: A Little Help needed to walk in hospital room?: A Lot Help needed climbing 3-5 steps with a railing? : A  Lot 6 Click Score: 16    End of Session Equipment Utilized During Treatment: Gait belt Activity Tolerance: Patient limited by fatigue Patient left: in chair;with chair alarm set;with call bell/phone within reach;with SCD's reapplied;with nursing/sitter in room Nurse Communication: Mobility status PT Visit Diagnosis: Other abnormalities of gait and mobility (R26.89);Difficulty in walking, not elsewhere classified (R26.2);Muscle weakness (generalized) (M62.81)    Time: 2957-4734 PT Time Calculation (min) (ACUTE ONLY): 14 min   Charges:   PT Evaluation $PT Eval Low Complexity: 1 Low          Cindy Ward, PT Acute Rehabilitation Services Pager [phone]  Office [phone]  Cindy Ward 11/19/2018, 2:44 PM

## 2018-11-20 DIAGNOSIS — E049 Nontoxic goiter, unspecified: Secondary | ICD-10-CM

## 2018-11-20 LAB — CBC
HCT: 28.8 % — ABNORMAL LOW (ref 36.0–46.0)
Hemoglobin: 9.3 g/dL — ABNORMAL LOW (ref 12.0–15.0)
MCH: 27.1 pg (ref 26.0–34.0)
MCHC: 32.3 g/dL (ref 30.0–36.0)
MCV: 84 fL (ref 80.0–100.0)
NRBC: 0 % (ref 0.0–0.2)
Platelets: 80 10*3/uL — ABNORMAL LOW (ref 150–400)
RBC: 3.43 MIL/uL — ABNORMAL LOW (ref 3.87–5.11)
RDW: 19 % — ABNORMAL HIGH (ref 11.5–15.5)
WBC: 8.7 10*3/uL (ref 4.0–10.5)

## 2018-11-20 LAB — BASIC METABOLIC PANEL
Anion gap: 11 (ref 5–15)
BUN: 15 mg/dL (ref 6–20)
CALCIUM: 7.6 mg/dL — AB (ref 8.9–10.3)
CO2: 19 mmol/L — ABNORMAL LOW (ref 22–32)
Chloride: 107 mmol/L (ref 98–111)
Creatinine, Ser: 0.59 mg/dL (ref 0.44–1.00)
GFR calc non Af Amer: >60 mL/min (ref >60)
Glucose, Bld: 120 mg/dL — ABNORMAL HIGH (ref 70–99)
Potassium: 4.4 mmol/L (ref 3.5–5.1)
Sodium: 137 mmol/L (ref 135–145)

## 2018-11-20 LAB — GLUCOSE, CAPILLARY: Glucose-Capillary: 92 mg/dL (ref 70–99)

## 2018-11-20 NOTE — Progress Notes (Signed)
[facility]                                                                              Patient Demographics  Cindy Ward, is a 53 y.o. female, DOB - 04/10/1966, JQG:[id number]  Admit date - 11/11/2018   Admitting Physician A Melven Sartorius., MD  Outpatient Primary MD for the patient is [name], MD  Outpatient specialists:   LOS - 9  days   Medical records reviewed and are as summarized below:    No chief complaint on file.      Brief summary   53 y.o.femalewith medical history significant ofhypertension hypothyroidism presents at [facility] with recurrent nausea, vomiting, fevers, and night sweats of 9 months duration.She notes that the symptoms occur about every 2 weeks. She has nightly fevers and night sweats. The symptoms occur for days at a time, but have been taking longer and longer to resolve.She came to the hospital today because she passed out at the cancer center. She describes getting dizzy and then hitting the floor. She was seen in the emergency department at [facility]. Her vitals were notable for a fever to 102.7. Multiple enlarged lymph nodes noted on CT abdomen and pelvis. The case was discussed with [name] who recommended transfer to [facility] for further workup as well as LDH, haptoglobin, and retics. 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 Hospital course complicated by recurrent high fevers related to malignancy.   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  Discussed with Dr. Maylon Peppers, patient is now due for the treatment again till 3/10, if her constitutional symptoms improve and fever free for at least 24 hours, patient can be discharged and further treatment as outpatient.  Overnight temp of 103.2 F. -Hemoglobin stable today 9.3, required packed RBC transfusion yesterday  Active Problems: Normocytic anemia with thrombocytopenia, splenomegaly -Likely secondary to underlying lymphoma -Supportive transfusion to keep hemoglobin above 7, goal platelets > 10K -H&H stable today  Severe protein calorie malnutrition in the setting of malignancy -Dietitian following, continue nutritional supplements  Small pericardial effusion 2D echo on 2/18 showed EF of 55 to 60%, small pericardial effusion most prominently along the RV/RA, stable, no tamponade physiology Outpatient follow-up with periodic EKGs  Near syncope Likely secondary to anemia, dehydration, #1  Hypothyroidism Continue Synthroid   Code Status: Full code DVT Prophylaxis:  Lovenox  Family Communication: Discussed in detail with the patient, all imaging results, lab results explained to the patient or *   Disposition Plan: When fever free for at least 24 hours, case management consult placed for financial assistance  Time Spent in minutes 25 minutes  Procedures:  2D echo  Consultants:   Oncology  Antimicrobials:   Anti-infectives (  From admission, onward)   Start     Dose/Rate Route Frequency Ordered Stop   11/18/18 0600  ceFAZolin (ANCEF) IVPB 2g/100 mL premix     2 g 200 mL/hr over 30 Minutes Intravenous To Radiology 11/17/18 1243 11/18/18 1130   11/12/18 0800  cefTRIAXone (ROCEPHIN) 2 g in sodium chloride 0.9 % 100 mL IVPB  Status:  Discontinued     2 g 200 mL/hr over 30 Minutes Intravenous Every 24 hours 11/11/18 1711 11/12/18 1057          Medications  Scheduled Meds: . enoxaparin (LOVENOX) injection  40 mg  Subcutaneous Q24H  . feeding supplement  1 Container Oral TID BM  . levothyroxine  75 mcg Oral Q0600  . mouth rinse  15 mL Mouth Rinse BID  . multivitamin with minerals  1 tablet Oral Daily  . protein supplement  8 oz Oral q12n4p  . scopolamine  1 patch Transdermal Q72H  . Tbo-filgastrim (GRANIX) SQ  480 mcg Subcutaneous q1800   Continuous Infusions: . sodium chloride 250 mL (11/19/18 1356)  . sodium chloride 75 mL/hr at 11/20/18 1504  . sodium chloride     PRN Meds:.sodium chloride, acetaminophen **OR** acetaminophen, HYDROmorphone (DILAUDID) injection, ibuprofen, iohexol, lip balm, ondansetron **OR** ondansetron (ZOFRAN) IV, oxyCODONE, polyethylene glycol, sodium chloride flush      Subjective:   Cindy Ward was seen and examined today.  Feeling generalized weakness, fatigue.  Has not been sleeping well.  Poor appetite.  patient denies dizziness, chest pain, shortness of breath, abdominal pain, N/V/D/C. No acute events overnight.    Objective:   Vitals:   11/20/18 0532 11/20/18 1015 11/20/18 1143 11/20/18 1308  BP: 137/82 139/81  116/74  Pulse: 92 (!) 103  77  Resp: _0 Temp: 100 F (37.8 C) (!) 103.2 F (39.6 C)  99.2 F (37.3 C)  TempSrc: Oral Oral Oral Oral  SpO2: 100% 96%  95%  Weight: 78.5 kg     Height:        Intake/Output Summary (Last 24 hours) at 11/20/2018 1527 Last data filed at 11/20/2018 1504 Gross per 24 hour  Intake 2685.12 ml  Output 750 ml  Net 1935.12 ml     Wt Readings from Last 3 Encounters:  11/20/18 78.5 kg  06/27/18 78.4 kg  05/14/18 79.4 kg     Exam  General: Alert and oriented x 3, NAD, ill-appearing  Eyes:   HEENT:    Cardiovascular: S1 S2 auscultated, Regular rate and rhythm.  Respiratory: Clear to auscultation bilaterally, no wheezing, rales or rhonchi  Gastrointestinal: Soft, nontender, nondistended, + bowel sounds  Ext: no pedal edema bilaterally  Neuro: No new deficits  Musculoskeletal: No digital  cyanosis, clubbing  Skin: No rashes  Psych: Normal affect and demeanor, alert and oriented x3    Data Reviewed:  I have personally reviewed following labs and imaging studies  Micro Results Recent Results (from the past 240 hour(s))  MRSA PCR Screening     Status: None   Collection Time: 11/11/18  4:21 PM  Result Value Ref Range Status   MRSA by PCR NEGATIVE NEGATIVE Final    Comment:        The GeneXpert MRSA Assay (FDA approved for NASAL specimens only), is one component of a comprehensive MRSA colonization surveillance program. It is not intended to diagnose MRSA infection nor to guide or monitor treatment for MRSA infections. Performed at [facility], [place] [address], [place], [place] [address]  Culture, blood (routine x 2)     Status: None   Collection Time: 11/13/18  3:54 PM  Result Value Ref Range Status   Specimen Description   Final    BLOOD RIGHT ANTECUBITAL Performed at [place] [address] [place], [place] [address]    Special Requests   Final    BOTTLES DRAWN AEROBIC AND ANAEROBIC Blood Culture adequate volume Performed at [place] [address] [place], [place] [address]    Culture   Final    NO GROWTH 5 DAYS Performed at [facility], [place] [address] [place], [place] [address]    Report Status 11/18/2018 FINAL  Final  Culture, blood (routine x 2)     Status: None   Collection Time: 11/13/18  4:02 PM  Result Value Ref Range Status   Specimen Description   Final    BLOOD RIGHT FOREARM Performed at [facility], [place] [address] [place], [place] [address]    Special Requests   Final    BOTTLES DRAWN AEROBIC AND ANAEROBIC Blood Culture adequate volume Performed at [place] [address], [place], [place] [address]    Culture   Final    NO GROWTH 5 DAYS Performed at [facility], [place] [address] [place], [place] [address]    Report  Status 11/18/2018 FINAL  Final  Culture, Urine     Status: None   Collection Time: 11/14/18  4:20 PM  Result Value Ref Range Status   Specimen Description   Final    URINE, CLEAN CATCH Performed at [facility], [place] [address] [place], [place] [address]    Special Requests   Final    NONE Performed at [facility], [place] [address] [place], [place] [address]    Culture   Final    NO GROWTH Performed at [facility], [place] [address] [place], [place] [address]    Report Status 11/15/2018 FINAL  Final    Radiology Reports Ct Soft Tissue Neck W Contrast  Result Date: 11/18/2018 CLINICAL DATA:  Initial evaluation for Hodgkin's lymphoma, initial workup. EXAM: CT NECK WITH CONTRAST CT CHEST, ABDOMEN, AND PELVIS WITH CONTRAST TECHNIQUE: Multidetector CT imaging of the chest, abdomen and pelvis was performed following the standard protocol during bolus administration of intravenous contrast. CONTRAST:  193m OMNIPAQUE IOHEXOL 300 MG/ML SOLN, 31mOMNIPAQUE IOHEXOL 300 MG/ML SOLN COMPARISON:  None. FINDINGS: CT NECK FINDINGS: Pharynx and larynx: Oral cavity within normal limits without mass lesion or loculated collection. Patient is largely edentulous. Calcified tonsilliths noted within the right palatine tonsil. Tonsils themselves symmetric and within normal limits bilaterally. Parapharyngeal fat maintained. Nasopharynx normal. No retropharyngeal collection. Epiglottis normal. Vallecula clear. Remainder of the hypopharynx and supraglottic larynx normal. Glottis within normal limits. Subglottic airway clear. Salivary glands: Parotid and submandibular glands within normal limits. Thyroid: Thyroid diffusely enlarged without discrete nodule or mass. Lymph nodes: Mildly prominent level II lymph nodes measure up to 9 mm in short axis bilaterally. Left level III nodes measure up to 9 mm as well. Increased number of shotty subcentimeter nodes within the neck  bilaterally, left slightly worse than right. No other pathologically enlarged lymph nodes identified within the neck. Vascular: Normal intravascular enhancement seen throughout the neck. Right IJ approach central venous catheter in place. Soft tissue swelling with stranding and scattered foci of soft tissue emphysema within the right neck likely related to central line  placement. Limited intracranial: Unremarkable Visualized orbits: Partially visualized inferior globes and orbits unremarkable. Mastoids and visualized paranasal sinuses: Mucosal thickening noted within the visualized maxillary and sphenoid sinuses. Visualized paranasal sinuses are otherwise clear. Visualize mastoids and middle ear cavities are clear. Skeleton: No acute osseous abnormality. No discrete lytic or blastic osseous lesions. Mild cervical spondylolysis present at C5-6. Other: None. CT CHEST FINDINGS Cardiovascular: Normal intravascular enhancement seen throughout the intra-abdominal aorta without aneurysm or other acute abnormality. Minimal atheromatous plaque within the aortic arch. Visualized great vessels within normal limits. Heart size normal. Small moderate pericardial effusion, simple fluid density. Mediastinum/Nodes: Enlarged nodes within the upper mediastinum measure up to 15 mm in short axis (series 3, image 11). No appreciable supraclavicular adenopathy. No other pathologically enlarged mediastinal lymph nodes. Mild soft tissue density within the right hilum without frank adenopathy. Mildly enlarged 11 mm left hilar node (series 3, image 28). Enlarged axillary adenopathy seen bilaterally, with the largest node on the left measuring 2.1 cm in short axis (series 3, image 16). Largest node on the right measures 1.7 cm in short axis (series 3, image 8). Scattered soft tissue stranding with emphysema and fluid density overlying the left axilla likely related to recent lymph node biopsy, partially visualized. Esophagus within normal  limits. Lungs/Pleura: Tracheobronchial tree intact and patent. Moderate layering bilateral pleural effusions with associated atelectasis. No other airspace consolidation. No pulmonary edema. No pneumothorax. 4 mm subpleural ground-glass nodule present at the anterior right upper lobe (series 4, image 57), indeterminate. No other worrisome pulmonary nodule or mass. Musculoskeletal: No acute osseous finding. No discrete lytic or blastic osseous lesions. CT ABDOMEN PELVIS FINDINGS Hepatobiliary: Liver demonstrates a normal contrast enhanced appearance. Gallbladder within normal limits. No biliary dilatation. Pancreas: Pancreas within normal limits. Spleen: Spleen enlarged measuring 14.3 cm in craniocaudad dimension. Few subtle hypodensities noted within the spleen, largest of which measures approximately 12 mm (series 3, image 57), indeterminate. Adrenals/Urinary Tract: Adrenal glands are normal. Kidneys equal in size with symmetric enhancement. No nephrolithiasis, hydronephrosis, or focal enhancing renal mass. No hydroureter. Bladder within normal limits. Stomach/Bowel: Stomach within normal limits. No evidence for bowel obstruction. Normal appendix. No acute inflammatory changes seen about the bowels. Vascular/Lymphatic: Normal intravascular enhancement seen throughout the intra-abdominal aorta. Mild aorto bi-iliac atherosclerotic disease. No aneurysm. Mesenteric vessels patent proximally. Enlarged 15 mm aortocaval lymph node (series 3, image 7). Additional multifocal shotty subcentimeter aortocaval and periaortic lymph nodes noted. No other pathologically enlarged intra-abdominal lymph nodes identified. Mildly prominent 12 mm left inguinal lymph nodes noted. No other adenopathy within the pelvis. Reproductive: Uterus within normal limits.  Ovaries normal. Other: Moderate volume free fluid within the pelvis, measuring simple fluid density. No free intraperitoneal air. Musculoskeletal: Scattered anasarca noted within  the external soft tissues. No acute osseous finding. No discrete lytic or blastic osseous lesions. IMPRESSION: CT NECK IMPRESSION 1. Increased number of shoddy subcentimeter lymph nodes throughout the neck bilaterally, left slightly worse than right. No pathologically enlarged lymph nodes identified. 2. Mild diffuse thyromegaly without discrete thyroid nodule or mass. 3. Mild soft tissue stranding with emphysema within the right lateral neck related to recent right-sided Port-A-Cath placement. CT CHEST IMPRESSION 1. Enlarged bilateral axillary and upper mediastinal adenopathy as above, likely related to provided history of lymphoma. Additional borderline enlarged left hilar node may be related to lymphoma or possibly reactive in nature. Attention at follow-up recommended. 2. Moderate layering bilateral pleural effusions with associated atelectasis. 3. Small to moderate pericardial effusion. 4. 4 mm right upper  lobe ground-glass nodule, indeterminate. Attention at follow-up recommended. CT ABDOMEN AND PELVIS IMPRESSION 1. Enlarged 15 mm aortocaval lymph node with mildly enlarged 12 mm left inguinal lymph nodes. Additional increased number of shotty subcentimeter retroperitoneal lymph nodes. Findings most likely related to history of lymphoma. 2. Splenomegaly. 3. Moderate volume free fluid within the pelvis, of uncertain etiology, but could be physiologic and/or related overall volume status. 4. Mild diffuse anasarca. Electronically Signed   By: Jeannine Boga M.D.   On: 11/18/2018 19:56   Ct Chest W Contrast  Result Date: 11/18/2018 CLINICAL DATA:  Initial evaluation for Hodgkin's lymphoma, initial workup. EXAM: CT NECK WITH CONTRAST CT CHEST, ABDOMEN, AND PELVIS WITH CONTRAST TECHNIQUE: Multidetector CT imaging of the chest, abdomen and pelvis was performed following the standard protocol during bolus administration of intravenous contrast. CONTRAST:  165m OMNIPAQUE IOHEXOL 300 MG/ML SOLN, 365mOMNIPAQUE  IOHEXOL 300 MG/ML SOLN COMPARISON:  None. FINDINGS: CT NECK FINDINGS: Pharynx and larynx: Oral cavity within normal limits without mass lesion or loculated collection. Patient is largely edentulous. Calcified tonsilliths noted within the right palatine tonsil. Tonsils themselves symmetric and within normal limits bilaterally. Parapharyngeal fat maintained. Nasopharynx normal. No retropharyngeal collection. Epiglottis normal. Vallecula clear. Remainder of the hypopharynx and supraglottic larynx normal. Glottis within normal limits. Subglottic airway clear. Salivary glands: Parotid and submandibular glands within normal limits. Thyroid: Thyroid diffusely enlarged without discrete nodule or mass. Lymph nodes: Mildly prominent level II lymph nodes measure up to 9 mm in short axis bilaterally. Left level III nodes measure up to 9 mm as well. Increased number of shotty subcentimeter nodes within the neck bilaterally, left slightly worse than right. No other pathologically enlarged lymph nodes identified within the neck. Vascular: Normal intravascular enhancement seen throughout the neck. Right IJ approach central venous catheter in place. Soft tissue swelling with stranding and scattered foci of soft tissue emphysema within the right neck likely related to central line placement. Limited intracranial: Unremarkable Visualized orbits: Partially visualized inferior globes and orbits unremarkable. Mastoids and visualized paranasal sinuses: Mucosal thickening noted within the visualized maxillary and sphenoid sinuses. Visualized paranasal sinuses are otherwise clear. Visualize mastoids and middle ear cavities are clear. Skeleton: No acute osseous abnormality. No discrete lytic or blastic osseous lesions. Mild cervical spondylolysis present at C5-6. Other: None. CT CHEST FINDINGS Cardiovascular: Normal intravascular enhancement seen throughout the intra-abdominal aorta without aneurysm or other acute abnormality. Minimal  atheromatous plaque within the aortic arch. Visualized great vessels within normal limits. Heart size normal. Small moderate pericardial effusion, simple fluid density. Mediastinum/Nodes: Enlarged nodes within the upper mediastinum measure up to 15 mm in short axis (series 3, image 11). No appreciable supraclavicular adenopathy. No other pathologically enlarged mediastinal lymph nodes. Mild soft tissue density within the right hilum without frank adenopathy. Mildly enlarged 11 mm left hilar node (series 3, image 28). Enlarged axillary adenopathy seen bilaterally, with the largest node on the left measuring 2.1 cm in short axis (series 3, image 16). Largest node on the right measures 1.7 cm in short axis (series 3, image 8). Scattered soft tissue stranding with emphysema and fluid density overlying the left axilla likely related to recent lymph node biopsy, partially visualized. Esophagus within normal limits. Lungs/Pleura: Tracheobronchial tree intact and patent. Moderate layering bilateral pleural effusions with associated atelectasis. No other airspace consolidation. No pulmonary edema. No pneumothorax. 4 mm subpleural ground-glass nodule present at the anterior right upper lobe (series 4, image 57), indeterminate. No other worrisome pulmonary nodule or mass. Musculoskeletal: No acute  osseous finding. No discrete lytic or blastic osseous lesions. CT ABDOMEN PELVIS FINDINGS Hepatobiliary: Liver demonstrates a normal contrast enhanced appearance. Gallbladder within normal limits. No biliary dilatation. Pancreas: Pancreas within normal limits. Spleen: Spleen enlarged measuring 14.3 cm in craniocaudad dimension. Few subtle hypodensities noted within the spleen, largest of which measures approximately 12 mm (series 3, image 57), indeterminate. Adrenals/Urinary Tract: Adrenal glands are normal. Kidneys equal in size with symmetric enhancement. No nephrolithiasis, hydronephrosis, or focal enhancing renal mass. No  hydroureter. Bladder within normal limits. Stomach/Bowel: Stomach within normal limits. No evidence for bowel obstruction. Normal appendix. No acute inflammatory changes seen about the bowels. Vascular/Lymphatic: Normal intravascular enhancement seen throughout the intra-abdominal aorta. Mild aorto bi-iliac atherosclerotic disease. No aneurysm. Mesenteric vessels patent proximally. Enlarged 15 mm aortocaval lymph node (series 3, image 7). Additional multifocal shotty subcentimeter aortocaval and periaortic lymph nodes noted. No other pathologically enlarged intra-abdominal lymph nodes identified. Mildly prominent 12 mm left inguinal lymph nodes noted. No other adenopathy within the pelvis. Reproductive: Uterus within normal limits.  Ovaries normal. Other: Moderate volume free fluid within the pelvis, measuring simple fluid density. No free intraperitoneal air. Musculoskeletal: Scattered anasarca noted within the external soft tissues. No acute osseous finding. No discrete lytic or blastic osseous lesions. IMPRESSION: CT NECK IMPRESSION 1. Increased number of shoddy subcentimeter lymph nodes throughout the neck bilaterally, left slightly worse than right. No pathologically enlarged lymph nodes identified. 2. Mild diffuse thyromegaly without discrete thyroid nodule or mass. 3. Mild soft tissue stranding with emphysema within the right lateral neck related to recent right-sided Port-A-Cath placement. CT CHEST IMPRESSION 1. Enlarged bilateral axillary and upper mediastinal adenopathy as above, likely related to provided history of lymphoma. Additional borderline enlarged left hilar node may be related to lymphoma or possibly reactive in nature. Attention at follow-up recommended. 2. Moderate layering bilateral pleural effusions with associated atelectasis. 3. Small to moderate pericardial effusion. 4. 4 mm right upper lobe ground-glass nodule, indeterminate. Attention at follow-up recommended. CT ABDOMEN AND PELVIS  IMPRESSION 1. Enlarged 15 mm aortocaval lymph node with mildly enlarged 12 mm left inguinal lymph nodes. Additional increased number of shotty subcentimeter retroperitoneal lymph nodes. Findings most likely related to history of lymphoma. 2. Splenomegaly. 3. Moderate volume free fluid within the pelvis, of uncertain etiology, but could be physiologic and/or related overall volume status. 4. Mild diffuse anasarca. Electronically Signed   By: Jeannine Boga M.D.   On: 11/18/2018 19:56   Ct Abdomen Pelvis W Contrast  Result Date: 11/18/2018 CLINICAL DATA:  Initial evaluation for Hodgkin's lymphoma, initial workup. EXAM: CT NECK WITH CONTRAST CT CHEST, ABDOMEN, AND PELVIS WITH CONTRAST TECHNIQUE: Multidetector CT imaging of the chest, abdomen and pelvis was performed following the standard protocol during bolus administration of intravenous contrast. CONTRAST:  157m OMNIPAQUE IOHEXOL 300 MG/ML SOLN, 389mOMNIPAQUE IOHEXOL 300 MG/ML SOLN COMPARISON:  None. FINDINGS: CT NECK FINDINGS: Pharynx and larynx: Oral cavity within normal limits without mass lesion or loculated collection. Patient is largely edentulous. Calcified tonsilliths noted within the right palatine tonsil. Tonsils themselves symmetric and within normal limits bilaterally. Parapharyngeal fat maintained. Nasopharynx normal. No retropharyngeal collection. Epiglottis normal. Vallecula clear. Remainder of the hypopharynx and supraglottic larynx normal. Glottis within normal limits. Subglottic airway clear. Salivary glands: Parotid and submandibular glands within normal limits. Thyroid: Thyroid diffusely enlarged without discrete nodule or mass. Lymph nodes: Mildly prominent level II lymph nodes measure up to 9 mm in short axis bilaterally. Left level III nodes measure up to 9 mm as  well. Increased number of shotty subcentimeter nodes within the neck bilaterally, left slightly worse than right. No other pathologically enlarged lymph nodes identified  within the neck. Vascular: Normal intravascular enhancement seen throughout the neck. Right IJ approach central venous catheter in place. Soft tissue swelling with stranding and scattered foci of soft tissue emphysema within the right neck likely related to central line placement. Limited intracranial: Unremarkable Visualized orbits: Partially visualized inferior globes and orbits unremarkable. Mastoids and visualized paranasal sinuses: Mucosal thickening noted within the visualized maxillary and sphenoid sinuses. Visualized paranasal sinuses are otherwise clear. Visualize mastoids and middle ear cavities are clear. Skeleton: No acute osseous abnormality. No discrete lytic or blastic osseous lesions. Mild cervical spondylolysis present at C5-6. Other: None. CT CHEST FINDINGS Cardiovascular: Normal intravascular enhancement seen throughout the intra-abdominal aorta without aneurysm or other acute abnormality. Minimal atheromatous plaque within the aortic arch. Visualized great vessels within normal limits. Heart size normal. Small moderate pericardial effusion, simple fluid density. Mediastinum/Nodes: Enlarged nodes within the upper mediastinum measure up to 15 mm in short axis (series 3, image 11). No appreciable supraclavicular adenopathy. No other pathologically enlarged mediastinal lymph nodes. Mild soft tissue density within the right hilum without frank adenopathy. Mildly enlarged 11 mm left hilar node (series 3, image 28). Enlarged axillary adenopathy seen bilaterally, with the largest node on the left measuring 2.1 cm in short axis (series 3, image 16). Largest node on the right measures 1.7 cm in short axis (series 3, image 8). Scattered soft tissue stranding with emphysema and fluid density overlying the left axilla likely related to recent lymph node biopsy, partially visualized. Esophagus within normal limits. Lungs/Pleura: Tracheobronchial tree intact and patent. Moderate layering bilateral pleural  effusions with associated atelectasis. No other airspace consolidation. No pulmonary edema. No pneumothorax. 4 mm subpleural ground-glass nodule present at the anterior right upper lobe (series 4, image 57), indeterminate. No other worrisome pulmonary nodule or mass. Musculoskeletal: No acute osseous finding. No discrete lytic or blastic osseous lesions. CT ABDOMEN PELVIS FINDINGS Hepatobiliary: Liver demonstrates a normal contrast enhanced appearance. Gallbladder within normal limits. No biliary dilatation. Pancreas: Pancreas within normal limits. Spleen: Spleen enlarged measuring 14.3 cm in craniocaudad dimension. Few subtle hypodensities noted within the spleen, largest of which measures approximately 12 mm (series 3, image 57), indeterminate. Adrenals/Urinary Tract: Adrenal glands are normal. Kidneys equal in size with symmetric enhancement. No nephrolithiasis, hydronephrosis, or focal enhancing renal mass. No hydroureter. Bladder within normal limits. Stomach/Bowel: Stomach within normal limits. No evidence for bowel obstruction. Normal appendix. No acute inflammatory changes seen about the bowels. Vascular/Lymphatic: Normal intravascular enhancement seen throughout the intra-abdominal aorta. Mild aorto bi-iliac atherosclerotic disease. No aneurysm. Mesenteric vessels patent proximally. Enlarged 15 mm aortocaval lymph node (series 3, image 7). Additional multifocal shotty subcentimeter aortocaval and periaortic lymph nodes noted. No other pathologically enlarged intra-abdominal lymph nodes identified. Mildly prominent 12 mm left inguinal lymph nodes noted. No other adenopathy within the pelvis. Reproductive: Uterus within normal limits.  Ovaries normal. Other: Moderate volume free fluid within the pelvis, measuring simple fluid density. No free intraperitoneal air. Musculoskeletal: Scattered anasarca noted within the external soft tissues. No acute osseous finding. No discrete lytic or blastic osseous lesions.  IMPRESSION: CT NECK IMPRESSION 1. Increased number of shoddy subcentimeter lymph nodes throughout the neck bilaterally, left slightly worse than right. No pathologically enlarged lymph nodes identified. 2. Mild diffuse thyromegaly without discrete thyroid nodule or mass. 3. Mild soft tissue stranding with emphysema within the right lateral neck related to  recent right-sided Port-A-Cath placement. CT CHEST IMPRESSION 1. Enlarged bilateral axillary and upper mediastinal adenopathy as above, likely related to provided history of lymphoma. Additional borderline enlarged left hilar node may be related to lymphoma or possibly reactive in nature. Attention at follow-up recommended. 2. Moderate layering bilateral pleural effusions with associated atelectasis. 3. Small to moderate pericardial effusion. 4. 4 mm right upper lobe ground-glass nodule, indeterminate. Attention at follow-up recommended. CT ABDOMEN AND PELVIS IMPRESSION 1. Enlarged 15 mm aortocaval lymph node with mildly enlarged 12 mm left inguinal lymph nodes. Additional increased number of shotty subcentimeter retroperitoneal lymph nodes. Findings most likely related to history of lymphoma. 2. Splenomegaly. 3. Moderate volume free fluid within the pelvis, of uncertain etiology, but could be physiologic and/or related overall volume status. 4. Mild diffuse anasarca. Electronically Signed   By: Jeannine Boga M.D.   On: 11/18/2018 19:56   Ct Biopsy  Result Date: 11/15/2018 INDICATION: Pancytopenia, concern for lymphoproliferative process EXAM: CT GUIDED RIGHT ILIAC BONE MARROW ASPIRATION AND CORE BIOPSY Date:  11/15/2018 11/15/2018 10:13 am Radiologist:  M. Daryll Brod, MD Guidance:  CT FLUOROSCOPY TIME:  Fluoroscopy Time: None. MEDICATIONS: 1% lidocaine local ANESTHESIA/SEDATION: 2.0 mg IV Versed; 50 mcg IV Fentanyl Moderate Sedation Time:  10 minutes The patient was continuously monitored during the procedure by the interventional radiology nurse under  my direct supervision. CONTRAST:  None. COMPLICATIONS: None PROCEDURE: Informed consent was obtained from the patient following explanation of the procedure, risks, benefits and alternatives. The patient understands, agrees and consents for the procedure. All questions were addressed. A time out was performed. The patient was positioned prone and non-contrast localization CT was performed of the pelvis to demonstrate the iliac marrow spaces. Maximal barrier sterile technique utilized including caps, mask, sterile gowns, sterile gloves, large sterile drape, hand hygiene, and Betadine prep. Under sterile conditions and local anesthesia, an 11 gauge coaxial bone biopsy needle was advanced into the right iliac marrow space. Needle position was confirmed with CT imaging. Initially, bone marrow aspiration was performed. Next, the 11 gauge outer cannula was utilized to obtain a right iliac bone marrow core biopsy. Needle was removed. Hemostasis was obtained with compression. The patient tolerated the procedure well. Samples were prepared with the cytotechnologist. No immediate complications. IMPRESSION: CT guided right iliac bone marrow aspiration and core biopsy. Electronically Signed   By: Jerilynn Mages.  Shick M.D.   On: 11/15/2018 11:25   Dg Chest Port 1 View  Result Date: 11/11/2018 CLINICAL DATA:  Fever EXAM: PORTABLE CHEST 1 VIEW COMPARISON:  None. FINDINGS: Mild cardiomegaly. No focal consolidation or effusion. No pneumothorax. IMPRESSION: No active disease. Electronically Signed   By: Donavan Foil M.D.   On: 11/11/2018 21:59   Ct Bone Marrow Biopsy & Aspiration  Result Date: 11/15/2018 INDICATION: Pancytopenia, concern for lymphoproliferative process EXAM: CT GUIDED RIGHT ILIAC BONE MARROW ASPIRATION AND CORE BIOPSY Date:  11/15/2018 11/15/2018 10:13 am Radiologist:  M. Daryll Brod, MD Guidance:  CT FLUOROSCOPY TIME:  Fluoroscopy Time: None. MEDICATIONS: 1% lidocaine local ANESTHESIA/SEDATION: 2.0 mg IV Versed; 50 mcg  IV Fentanyl Moderate Sedation Time:  10 minutes The patient was continuously monitored during the procedure by the interventional radiology nurse under my direct supervision. CONTRAST:  None. COMPLICATIONS: None PROCEDURE: Informed consent was obtained from the patient following explanation of the procedure, risks, benefits and alternatives. The patient understands, agrees and consents for the procedure. All questions were addressed. A time out was performed. The patient was positioned prone and non-contrast localization CT was  performed of the pelvis to demonstrate the iliac marrow spaces. Maximal barrier sterile technique utilized including caps, mask, sterile gowns, sterile gloves, large sterile drape, hand hygiene, and Betadine prep. Under sterile conditions and local anesthesia, an 11 gauge coaxial bone biopsy needle was advanced into the right iliac marrow space. Needle position was confirmed with CT imaging. Initially, bone marrow aspiration was performed. Next, the 11 gauge outer cannula was utilized to obtain a right iliac bone marrow core biopsy. Needle was removed. Hemostasis was obtained with compression. The patient tolerated the procedure well. Samples were prepared with the cytotechnologist. No immediate complications. IMPRESSION: CT guided right iliac bone marrow aspiration and core biopsy. Electronically Signed   By: Jerilynn Mages.  Shick M.D.   On: 11/15/2018 11:25   Ir Imaging Guided Port Insertion  Result Date: 11/18/2018 INDICATION: 53 year old female with history lymphoma EXAM: IMPLANTED PORT A CATH PLACEMENT WITH ULTRASOUND AND FLUOROSCOPIC GUIDANCE MEDICATIONS: 2.0 g Ancef; The antibiotic was administered within an appropriate time interval prior to skin puncture. ANESTHESIA/SEDATION: Moderate (conscious) sedation was employed during this procedure. A total of Versed 2.0 mg and Fentanyl 100 mcg was administered intravenously. Moderate Sedation Time: 18 minutes. The patient's level of consciousness  and vital signs were monitored continuously by radiology nursing throughout the procedure under my direct supervision. FLUOROSCOPY TIME:  0 minutes, 12 seconds (1.0 mGy) COMPLICATIONS: None PROCEDURE: The procedure, risks, benefits, and alternatives were explained to the patient. Questions regarding the procedure were encouraged and answered. The patient understands and consents to the procedure. Ultrasound survey was performed with images stored and sent to PACs. The right neck and chest was prepped with chlorhexidine, and draped in the usual sterile fashion using maximum barrier technique (cap and mask, sterile gown, sterile gloves, large sterile sheet, hand hygiene and cutaneous antiseptic). Antibiotic prophylaxis was provided with 2.0g Ancef administered IV one hour prior to skin incision. Local anesthesia was attained by infiltration with 1% lidocaine without epinephrine. Ultrasound demonstrated patency of the right internal jugular vein, and this was documented with an image. Under real-time ultrasound guidance, this vein was accessed with a 21 gauge micropuncture needle and image documentation was performed. A small dermatotomy was made at the access site with an 11 scalpel. A 0.018" wire was advanced into the SVC and used to estimate the length of the internal catheter. The access needle exchanged for a 56F micropuncture vascular sheath. The 0.018" wire was then removed and a 0.035" wire advanced into the IVC. An appropriate location for the subcutaneous reservoir was selected below the clavicle and an incision was made through the skin and underlying soft tissues. The subcutaneous tissues were then dissected using a combination of blunt and sharp surgical technique and a pocket was formed. A single lumen power injectable portacatheter was then tunneled through the subcutaneous tissues from the pocket to the dermatotomy and the port reservoir placed within the subcutaneous pocket. The venous access site was  then serially dilated and a peel away vascular sheath placed over the wire. The wire was removed and the port catheter advanced into position under fluoroscopic guidance. The catheter tip is positioned in the cavoatrial junction. This was documented with a spot image. The portacatheter was then tested and found to flush and aspirate well. The port was flushed with saline followed by 100 units/mL heparinized saline. The pocket was then closed in two layers using first subdermal inverted interrupted absorbable sutures followed by a running subcuticular suture. The epidermis was then sealed with Dermabond. The dermatotomy at  the venous access site was also seal with Dermabond. Patient tolerated the procedure well and remained hemodynamically stable throughout. No complications encountered and no significant blood loss encountered IMPRESSION: Status post right IJ port catheter placement. Catheter ready for use. Signed, Dulcy Fanny. Dellia Nims, RPVI [facility] Radiology Electronically Signed   By: Corrie Mckusick D.O.   On: 11/18/2018 11:23    Lab Data:  CBC: Recent Labs  Lab 11/14/18 0308  11/15/18 0557 11/16/18 0605 11/18/18 0354 11/19/18 0540 11/20/18 0500  WBC 3.8*  --  3.7* 3.8* 4.0 4.5 8.7  NEUTROABS 2.8  --  2.9  --   --   --   --   HGB 6.7*   < > 7.7* 7.9* 7.2* 6.9* 9.3*  HCT 20.9*   < > 24.3* 25.1* 23.7* 22.1* 28.8*  MCV 84.3  --  86.5 86.9 87.8 86.7 84.0  PLT 73*  --  87* 101* 109* 93* 80*   < > = values in this interval not displayed.   Basic Metabolic Panel: Recent Labs  Lab 11/15/18 0557 11/15/18 1636 11/16/18 0324 11/18/18 0354 11/19/18 0540 11/20/18 0500  NA 134* 134* 134* 137 136 137  K 2.6* 3.2* 3.9 2.9* 3.3* 4.4  CL 97* 97* 99 105 105 107  CO2 _0 21* 19*  GLUCOSE 112* 134* 85 79 103* 120*  BUN _1 CREATININE 0.77 0.83 0.73 0.68 0.63 0.59  CALCIUM 7.7* 7.5* 7.5* 7.3* 7.2* 7.6*  MG 2.0  --  2.0  --    --   --   PHOS 1.7*  --  2.4*  --   --   --    GFR: Estimated Creatinine Clearance: 89.5 mL/min (by C-G formula based on SCr of 0.59 mg/dL). Liver Function Tests: Recent Labs  Lab 11/15/18 0557 11/19/18 0805  AST 43* 30  ALT 24 15  ALKPHOS 120 154*  BILITOT 1.5* 1.2  PROT 5.4* 5.1*  ALBUMIN 2.0* 1.7*   No results for input(s): LIPASE, AMYLASE in the last 168 hours. No results for input(s): AMMONIA in the last 168 hours. Coagulation Profile: Recent Labs  Lab 11/18/18 0354  INR 1.65   Cardiac Enzymes: No results for input(s): CKTOTAL, CKMB, CKMBINDEX, TROPONINI in the last 168 hours. BNP (last 3 results) No results for input(s): PROBNP in the last 8760 hours. HbA1C: No results for input(s): HGBA1C in the last 72 hours. CBG: No results for input(s): GLUCAP in the last 168 hours. Lipid Profile: No results for input(s): CHOL, HDL, LDLCALC, TRIG, CHOLHDL, LDLDIRECT in the last 72 hours. Thyroid Function Tests: No results for input(s): TSH, T4TOTAL, FREET4, T3FREE, THYROIDAB in the last 72 hours. Anemia Panel: No results for input(s): VITAMINB12, FOLATE, FERRITIN, TIBC, IRON, RETICCTPCT in the last 72 hours. Urine analysis:    Component Value Date/Time   COLORURINE YELLOW 11/14/2018 1619   APPEARANCEUR HAZY (A) 11/14/2018 1619   LABSPEC 1.016 11/14/2018 1619   PHURINE 5.0 11/14/2018 1619   GLUCOSEU NEGATIVE 11/14/2018 1619   HGBUR SMALL (A) 11/14/2018 1619   BILIRUBINUR NEGATIVE 11/14/2018 1619   KETONESUR NEGATIVE 11/14/2018 1619   PROTEINUR 30 (A) 11/14/2018 1619   NITRITE NEGATIVE 11/14/2018 1619   LEUKOCYTESUR NEGATIVE 11/14/2018 1619     Ripudeep Rai M.D. [facility] 11/20/2018, 3:27 PM  Pager: [phone] Between 7am to 7pm - call Pager - [phone]  After 7pm go to [email or web address] - password [email or web address]  Call night coverage  person covering after 7pm

## 2018-11-20 NOTE — Progress Notes (Addendum)
[facility] INPATIENT PROGRESS NOTE  SUBJECTIVE: Cindy Ward 53 y.o. female with Stage IV Hodgkin lymphoma. Received Day 1 Cycle 1 AVD + Adcedris  on 11/19/2018. TMax 100 at 530 this morning. Has not received Tylenol since last evening.  Temperature at the time of my visit was 103.2. Tylenol given. The patient reports that she feels hot.  Still having chills and night sweats.  Denies having any nausea or vomiting.  Still having a cough.  No chest discomfort or shortness of breath.  No constipation or diarrhea.  Denies bleeding.  ALLERGIES:  has No Known Allergies.  MEDICATIONS:  Current Facility-Administered Medications  Medication Dose Route Frequency Provider Last Rate Last Dose  . 0.9 %  sodium chloride infusion   Intravenous PRN Irene Pap N, DO 10 mL/hr at 11/19/18 1356 250 mL at 11/19/18 1356  . 0.9 %  sodium chloride infusion   Intravenous Continuous Kayleen Memos, DO 75 mL/hr at 11/20/18 3300    . 0.9 %  sodium chloride infusion   Intravenous Once Tish Men, MD      . acetaminophen (TYLENOL) tablet 650 mg  650 mg Oral Q6H PRN Earnstine Regal, PA-C   650 mg at 11/19/18 2240   Or  . acetaminophen (TYLENOL) suppository 650 mg  650 mg Rectal Q6H PRN Earnstine Regal, PA-C   650 mg at 11/18/18 2147  . enoxaparin (LOVENOX) injection 40 mg  40 mg Subcutaneous Q24H Monia Sabal, PA-C   40 mg at 11/19/18 1241  . feeding supplement (BOOST / RESOURCE BREEZE) liquid 1 Container  1 Container Oral TID BM Kayleen Memos, DO   1 Container at 11/18/18 2131  . HYDROmorphone (DILAUDID) injection 0.5-1 mg  0.5-1 mg Intravenous Q4H PRN Earnstine Regal, PA-C      . ibuprofen (ADVIL,MOTRIN) tablet 400 mg  400 mg Oral Q8H PRN Irene Pap N, DO   400 mg at 11/18/18 2334  . iohexol (OMNIPAQUE) 300 MG/ML solution 15 mL  15 mL Oral Once PRN Irene Pap N, DO   30 mL at 11/18/18 1740  . levothyroxine (SYNTHROID, LEVOTHROID) tablet 75 mcg  75 mcg Oral Q0600 Earnstine Regal, PA-C   75 mcg  at 11/20/18 0500  . lip balm (CARMEX) ointment   Topical PRN Irene Pap N, DO      . MEDLINE mouth rinse  15 mL Mouth Rinse BID Earnstine Regal, PA-C   15 mL at 11/19/18 2206  . multivitamin with minerals tablet 1 tablet  1 tablet Oral Daily Irene Pap N, DO   1 tablet at 11/19/18 1241  . ondansetron (ZOFRAN) tablet 4 mg  4 mg Oral Q6H PRN Earnstine Regal, PA-C       Or  . ondansetron [facility]) injection 4 mg  4 mg Intravenous Q6H PRN Earnstine Regal, PA-C   4 mg at 11/12/18 7622  . oxyCODONE (Oxy IR/ROXICODONE) immediate release tablet 5-10 mg  5-10 mg Oral Q4H PRN Earnstine Regal, PA-C      . polyethylene glycol (MIRALAX / GLYCOLAX) packet 17 g  17 g Oral Daily PRN Earnstine Regal, PA-C      . protein supplement [facility] CHICKEN SOUP) powder 8 oz  8 oz Oral q12n4p Hall, Carole N, DO      . scopolamine (TRANSDERM-SCOP) 1 MG/3DAYS 1.5 mg  1 patch Transdermal Q72H Earnstine Regal, PA-C   1.5 mg at 11/18/18 1722  . sodium chloride flush (NS) 0.9 % injection 10-40 mL  10-40 mL Intracatheter PRN Irene Pap  N, DO      . Tbo-Filgrastim (GRANIX) injection 480 mcg  480 mcg Subcutaneous q1800 Tish Men, MD        REVIEW OF SYSTEMS:   Review of Systems  Review of Systems  Constitutional: Positive for chills, fever, malaise/fatigue and weight loss.  HENT: Negative.   Eyes: Negative.   Respiratory: Positive for cough. Negative for sputum production and shortness of breath.   Cardiovascular: Positive for leg swelling. Negative for chest pain and palpitations.  Gastrointestinal: Negative.   Genitourinary: Negative.   Musculoskeletal: Negative.   Skin: Negative.   Neurological: Negative.   Endo/Heme/Allergies: Bruises/bleeds easily.       Bruises easily.  Psychiatric/Behavioral: Negative.       PHYSICAL EXAMINATION:  Vital signs in last 24 hours: Temp:  [97.6 F (36.4 C)-100 F (37.8 C)] 100 F (37.8 C) (02/26 0532) Pulse Rate:  [63-92] 92 (02/26 0532) Resp:  [16-20] 20  (02/26 0532) BP: (106-144)/(75-89) 137/82 (02/26 0532) SpO2:  [96 %-100 %] 100 % (02/26 0532) Weight:  [173 lb 1.6 oz (78.5 kg)] 173 lb 1.6 oz (78.5 kg) (02/26 0532) Weight change: 6 lb 4.8 oz (2.858 kg) Last BM Date: 11/19/18  ECOG PERFORMANCE STATUS: 1 - Symptomatic but completely ambulatory  Intake/Output from previous day: 02/25 0701 - 02/26 0700 In: 3261 [P.O.:752; I.V.:1730.7; Blood:327; IV Piggyback:451.3] Out: 700 [Urine:700] General: Alert, awake without distress. Head: Normocephalic atraumatic. Mouth: mucous membranes moist, pharynx normal without lesions Eyes: No scleral icterus.  Pupils are equal and round reactive to light. Resp: clear to auscultation bilaterally without rhonchi or wheezes or dullness to percussion. Cardio: regular rate and rhythm, S1, S2 normal, no murmur, click, rub or gallop.  Trace bilateral lower extremity edema. GI: soft, non-tender; bowel sounds normal; no masses,  no organomegaly Musculoskeletal: No joint deformity or effusion. Neurological: No motor, sensory deficits.  Intact deep tendon reflexes. Skin: No rashes or lesions.  Bruising noted to her arms from previous IV sticks or lab draws.  Portacath/PICC-without erythema  LABORATORY DATA: Lab Results  Component Value Date   WBC 8.7 11/20/2018   HGB 9.3 (L) 11/20/2018   HCT 28.8 (L) 11/20/2018   MCV 84.0 11/20/2018   PLT 80 (L) 11/20/2018    CMP Latest Ref Rng & Units 11/20/2018 11/19/2018 11/18/2018  Glucose 70 - 99 mg/dL 120(H) 103(H) 79  BUN 6 - 20 mg/dL '15 11 12  ' Creatinine 0.44 - 1.00 mg/dL 0.59 0.63 0.68  Sodium 135 - 145 mmol/L 137 136 137  Potassium 3.5 - 5.1 mmol/L 4.4 3.3(L) 2.9(L)  Chloride 98 - 111 mmol/L 107 105 105  CO2 22 - 32 mmol/L 19(L) 21(L) 22  Calcium 8.9 - 10.3 mg/dL 7.6(L) 7.2(L) 7.3(L)  Total Protein 6.5 - 8.1 g/dL - 5.1(L) -  Total Bilirubin 0.3 - 1.2 mg/dL - 1.2 -  Alkaline Phos 38 - 126 U/L - 154(H) -  AST 15 - 41 U/L - 30 -  ALT 0 - 44 U/L - 15 -     RADIOGRAPHIC STUDIES:  Ct Soft Tissue Neck W Contrast  Result Date: 11/18/2018 CLINICAL DATA:  Initial evaluation for Hodgkin's lymphoma, initial workup. EXAM: CT NECK WITH CONTRAST CT CHEST, ABDOMEN, AND PELVIS WITH CONTRAST TECHNIQUE: Multidetector CT imaging of the chest, abdomen and pelvis was performed following the standard protocol during bolus administration of intravenous contrast. CONTRAST:  114m OMNIPAQUE IOHEXOL 300 MG/ML SOLN, 366mOMNIPAQUE IOHEXOL 300 MG/ML SOLN COMPARISON:  None. FINDINGS: CT NECK FINDINGS: Pharynx and larynx: Oral  cavity within normal limits without mass lesion or loculated collection. Patient is largely edentulous. Calcified tonsilliths noted within the right palatine tonsil. Tonsils themselves symmetric and within normal limits bilaterally. Parapharyngeal fat maintained. Nasopharynx normal. No retropharyngeal collection. Epiglottis normal. Vallecula clear. Remainder of the hypopharynx and supraglottic larynx normal. Glottis within normal limits. Subglottic airway clear. Salivary glands: Parotid and submandibular glands within normal limits. Thyroid: Thyroid diffusely enlarged without discrete nodule or mass. Lymph nodes: Mildly prominent level II lymph nodes measure up to 9 mm in short axis bilaterally. Left level III nodes measure up to 9 mm as well. Increased number of shotty subcentimeter nodes within the neck bilaterally, left slightly worse than right. No other pathologically enlarged lymph nodes identified within the neck. Vascular: Normal intravascular enhancement seen throughout the neck. Right IJ approach central venous catheter in place. Soft tissue swelling with stranding and scattered foci of soft tissue emphysema within the right neck likely related to central line placement. Limited intracranial: Unremarkable Visualized orbits: Partially visualized inferior globes and orbits unremarkable. Mastoids and visualized paranasal sinuses: Mucosal thickening noted  within the visualized maxillary and sphenoid sinuses. Visualized paranasal sinuses are otherwise clear. Visualize mastoids and middle ear cavities are clear. Skeleton: No acute osseous abnormality. No discrete lytic or blastic osseous lesions. Mild cervical spondylolysis present at C5-6. Other: None. CT CHEST FINDINGS Cardiovascular: Normal intravascular enhancement seen throughout the intra-abdominal aorta without aneurysm or other acute abnormality. Minimal atheromatous plaque within the aortic arch. Visualized great vessels within normal limits. Heart size normal. Small moderate pericardial effusion, simple fluid density. Mediastinum/Nodes: Enlarged nodes within the upper mediastinum measure up to 15 mm in short axis (series 3, image 11). No appreciable supraclavicular adenopathy. No other pathologically enlarged mediastinal lymph nodes. Mild soft tissue density within the right hilum without frank adenopathy. Mildly enlarged 11 mm left hilar node (series 3, image 28). Enlarged axillary adenopathy seen bilaterally, with the largest node on the left measuring 2.1 cm in short axis (series 3, image 16). Largest node on the right measures 1.7 cm in short axis (series 3, image 8). Scattered soft tissue stranding with emphysema and fluid density overlying the left axilla likely related to recent lymph node biopsy, partially visualized. Esophagus within normal limits. Lungs/Pleura: Tracheobronchial tree intact and patent. Moderate layering bilateral pleural effusions with associated atelectasis. No other airspace consolidation. No pulmonary edema. No pneumothorax. 4 mm subpleural ground-glass nodule present at the anterior right upper lobe (series 4, image 57), indeterminate. No other worrisome pulmonary nodule or mass. Musculoskeletal: No acute osseous finding. No discrete lytic or blastic osseous lesions. CT ABDOMEN PELVIS FINDINGS Hepatobiliary: Liver demonstrates a normal contrast enhanced appearance. Gallbladder  within normal limits. No biliary dilatation. Pancreas: Pancreas within normal limits. Spleen: Spleen enlarged measuring 14.3 cm in craniocaudad dimension. Few subtle hypodensities noted within the spleen, largest of which measures approximately 12 mm (series 3, image 57), indeterminate. Adrenals/Urinary Tract: Adrenal glands are normal. Kidneys equal in size with symmetric enhancement. No nephrolithiasis, hydronephrosis, or focal enhancing renal mass. No hydroureter. Bladder within normal limits. Stomach/Bowel: Stomach within normal limits. No evidence for bowel obstruction. Normal appendix. No acute inflammatory changes seen about the bowels. Vascular/Lymphatic: Normal intravascular enhancement seen throughout the intra-abdominal aorta. Mild aorto bi-iliac atherosclerotic disease. No aneurysm. Mesenteric vessels patent proximally. Enlarged 15 mm aortocaval lymph node (series 3, image 7). Additional multifocal shotty subcentimeter aortocaval and periaortic lymph nodes noted. No other pathologically enlarged intra-abdominal lymph nodes identified. Mildly prominent 12 mm left inguinal lymph nodes noted.  No other adenopathy within the pelvis. Reproductive: Uterus within normal limits.  Ovaries normal. Other: Moderate volume free fluid within the pelvis, measuring simple fluid density. No free intraperitoneal air. Musculoskeletal: Scattered anasarca noted within the external soft tissues. No acute osseous finding. No discrete lytic or blastic osseous lesions. IMPRESSION: CT NECK IMPRESSION 1. Increased number of shoddy subcentimeter lymph nodes throughout the neck bilaterally, left slightly worse than right. No pathologically enlarged lymph nodes identified. 2. Mild diffuse thyromegaly without discrete thyroid nodule or mass. 3. Mild soft tissue stranding with emphysema within the right lateral neck related to recent right-sided Port-A-Cath placement. CT CHEST IMPRESSION 1. Enlarged bilateral axillary and upper  mediastinal adenopathy as above, likely related to provided history of lymphoma. Additional borderline enlarged left hilar node may be related to lymphoma or possibly reactive in nature. Attention at follow-up recommended. 2. Moderate layering bilateral pleural effusions with associated atelectasis. 3. Small to moderate pericardial effusion. 4. 4 mm right upper lobe ground-glass nodule, indeterminate. Attention at follow-up recommended. CT ABDOMEN AND PELVIS IMPRESSION 1. Enlarged 15 mm aortocaval lymph node with mildly enlarged 12 mm left inguinal lymph nodes. Additional increased number of shotty subcentimeter retroperitoneal lymph nodes. Findings most likely related to history of lymphoma. 2. Splenomegaly. 3. Moderate volume free fluid within the pelvis, of uncertain etiology, but could be physiologic and/or related overall volume status. 4. Mild diffuse anasarca. Electronically Signed   By: Jeannine Boga M.D.   On: 11/18/2018 19:56   Ct Chest W Contrast  Result Date: 11/18/2018 CLINICAL DATA:  Initial evaluation for Hodgkin's lymphoma, initial workup. EXAM: CT NECK WITH CONTRAST CT CHEST, ABDOMEN, AND PELVIS WITH CONTRAST TECHNIQUE: Multidetector CT imaging of the chest, abdomen and pelvis was performed following the standard protocol during bolus administration of intravenous contrast. CONTRAST:  148m OMNIPAQUE IOHEXOL 300 MG/ML SOLN, 329mOMNIPAQUE IOHEXOL 300 MG/ML SOLN COMPARISON:  None. FINDINGS: CT NECK FINDINGS: Pharynx and larynx: Oral cavity within normal limits without mass lesion or loculated collection. Patient is largely edentulous. Calcified tonsilliths noted within the right palatine tonsil. Tonsils themselves symmetric and within normal limits bilaterally. Parapharyngeal fat maintained. Nasopharynx normal. No retropharyngeal collection. Epiglottis normal. Vallecula clear. Remainder of the hypopharynx and supraglottic larynx normal. Glottis within normal limits. Subglottic airway  clear. Salivary glands: Parotid and submandibular glands within normal limits. Thyroid: Thyroid diffusely enlarged without discrete nodule or mass. Lymph nodes: Mildly prominent level II lymph nodes measure up to 9 mm in short axis bilaterally. Left level III nodes measure up to 9 mm as well. Increased number of shotty subcentimeter nodes within the neck bilaterally, left slightly worse than right. No other pathologically enlarged lymph nodes identified within the neck. Vascular: Normal intravascular enhancement seen throughout the neck. Right IJ approach central venous catheter in place. Soft tissue swelling with stranding and scattered foci of soft tissue emphysema within the right neck likely related to central line placement. Limited intracranial: Unremarkable Visualized orbits: Partially visualized inferior globes and orbits unremarkable. Mastoids and visualized paranasal sinuses: Mucosal thickening noted within the visualized maxillary and sphenoid sinuses. Visualized paranasal sinuses are otherwise clear. Visualize mastoids and middle ear cavities are clear. Skeleton: No acute osseous abnormality. No discrete lytic or blastic osseous lesions. Mild cervical spondylolysis present at C5-6. Other: None. CT CHEST FINDINGS Cardiovascular: Normal intravascular enhancement seen throughout the intra-abdominal aorta without aneurysm or other acute abnormality. Minimal atheromatous plaque within the aortic arch. Visualized great vessels within normal limits. Heart size normal. Small moderate pericardial effusion, simple fluid density.  Mediastinum/Nodes: Enlarged nodes within the upper mediastinum measure up to 15 mm in short axis (series 3, image 11). No appreciable supraclavicular adenopathy. No other pathologically enlarged mediastinal lymph nodes. Mild soft tissue density within the right hilum without frank adenopathy. Mildly enlarged 11 mm left hilar node (series 3, image 28). Enlarged axillary adenopathy seen  bilaterally, with the largest node on the left measuring 2.1 cm in short axis (series 3, image 16). Largest node on the right measures 1.7 cm in short axis (series 3, image 8). Scattered soft tissue stranding with emphysema and fluid density overlying the left axilla likely related to recent lymph node biopsy, partially visualized. Esophagus within normal limits. Lungs/Pleura: Tracheobronchial tree intact and patent. Moderate layering bilateral pleural effusions with associated atelectasis. No other airspace consolidation. No pulmonary edema. No pneumothorax. 4 mm subpleural ground-glass nodule present at the anterior right upper lobe (series 4, image 57), indeterminate. No other worrisome pulmonary nodule or mass. Musculoskeletal: No acute osseous finding. No discrete lytic or blastic osseous lesions. CT ABDOMEN PELVIS FINDINGS Hepatobiliary: Liver demonstrates a normal contrast enhanced appearance. Gallbladder within normal limits. No biliary dilatation. Pancreas: Pancreas within normal limits. Spleen: Spleen enlarged measuring 14.3 cm in craniocaudad dimension. Few subtle hypodensities noted within the spleen, largest of which measures approximately 12 mm (series 3, image 57), indeterminate. Adrenals/Urinary Tract: Adrenal glands are normal. Kidneys equal in size with symmetric enhancement. No nephrolithiasis, hydronephrosis, or focal enhancing renal mass. No hydroureter. Bladder within normal limits. Stomach/Bowel: Stomach within normal limits. No evidence for bowel obstruction. Normal appendix. No acute inflammatory changes seen about the bowels. Vascular/Lymphatic: Normal intravascular enhancement seen throughout the intra-abdominal aorta. Mild aorto bi-iliac atherosclerotic disease. No aneurysm. Mesenteric vessels patent proximally. Enlarged 15 mm aortocaval lymph node (series 3, image 7). Additional multifocal shotty subcentimeter aortocaval and periaortic lymph nodes noted. No other pathologically enlarged  intra-abdominal lymph nodes identified. Mildly prominent 12 mm left inguinal lymph nodes noted. No other adenopathy within the pelvis. Reproductive: Uterus within normal limits.  Ovaries normal. Other: Moderate volume free fluid within the pelvis, measuring simple fluid density. No free intraperitoneal air. Musculoskeletal: Scattered anasarca noted within the external soft tissues. No acute osseous finding. No discrete lytic or blastic osseous lesions. IMPRESSION: CT NECK IMPRESSION 1. Increased number of shoddy subcentimeter lymph nodes throughout the neck bilaterally, left slightly worse than right. No pathologically enlarged lymph nodes identified. 2. Mild diffuse thyromegaly without discrete thyroid nodule or mass. 3. Mild soft tissue stranding with emphysema within the right lateral neck related to recent right-sided Port-A-Cath placement. CT CHEST IMPRESSION 1. Enlarged bilateral axillary and upper mediastinal adenopathy as above, likely related to provided history of lymphoma. Additional borderline enlarged left hilar node may be related to lymphoma or possibly reactive in nature. Attention at follow-up recommended. 2. Moderate layering bilateral pleural effusions with associated atelectasis. 3. Small to moderate pericardial effusion. 4. 4 mm right upper lobe ground-glass nodule, indeterminate. Attention at follow-up recommended. CT ABDOMEN AND PELVIS IMPRESSION 1. Enlarged 15 mm aortocaval lymph node with mildly enlarged 12 mm left inguinal lymph nodes. Additional increased number of shotty subcentimeter retroperitoneal lymph nodes. Findings most likely related to history of lymphoma. 2. Splenomegaly. 3. Moderate volume free fluid within the pelvis, of uncertain etiology, but could be physiologic and/or related overall volume status. 4. Mild diffuse anasarca. Electronically Signed   By: Jeannine Boga M.D.   On: 11/18/2018 19:56   Ct Abdomen Pelvis W Contrast  Result Date: 11/18/2018 CLINICAL DATA:   Initial evaluation  for Hodgkin's lymphoma, initial workup. EXAM: CT NECK WITH CONTRAST CT CHEST, ABDOMEN, AND PELVIS WITH CONTRAST TECHNIQUE: Multidetector CT imaging of the chest, abdomen and pelvis was performed following the standard protocol during bolus administration of intravenous contrast. CONTRAST:  162m OMNIPAQUE IOHEXOL 300 MG/ML SOLN, 363mOMNIPAQUE IOHEXOL 300 MG/ML SOLN COMPARISON:  None. FINDINGS: CT NECK FINDINGS: Pharynx and larynx: Oral cavity within normal limits without mass lesion or loculated collection. Patient is largely edentulous. Calcified tonsilliths noted within the right palatine tonsil. Tonsils themselves symmetric and within normal limits bilaterally. Parapharyngeal fat maintained. Nasopharynx normal. No retropharyngeal collection. Epiglottis normal. Vallecula clear. Remainder of the hypopharynx and supraglottic larynx normal. Glottis within normal limits. Subglottic airway clear. Salivary glands: Parotid and submandibular glands within normal limits. Thyroid: Thyroid diffusely enlarged without discrete nodule or mass. Lymph nodes: Mildly prominent level II lymph nodes measure up to 9 mm in short axis bilaterally. Left level III nodes measure up to 9 mm as well. Increased number of shotty subcentimeter nodes within the neck bilaterally, left slightly worse than right. No other pathologically enlarged lymph nodes identified within the neck. Vascular: Normal intravascular enhancement seen throughout the neck. Right IJ approach central venous catheter in place. Soft tissue swelling with stranding and scattered foci of soft tissue emphysema within the right neck likely related to central line placement. Limited intracranial: Unremarkable Visualized orbits: Partially visualized inferior globes and orbits unremarkable. Mastoids and visualized paranasal sinuses: Mucosal thickening noted within the visualized maxillary and sphenoid sinuses. Visualized paranasal sinuses are otherwise clear.  Visualize mastoids and middle ear cavities are clear. Skeleton: No acute osseous abnormality. No discrete lytic or blastic osseous lesions. Mild cervical spondylolysis present at C5-6. Other: None. CT CHEST FINDINGS Cardiovascular: Normal intravascular enhancement seen throughout the intra-abdominal aorta without aneurysm or other acute abnormality. Minimal atheromatous plaque within the aortic arch. Visualized great vessels within normal limits. Heart size normal. Small moderate pericardial effusion, simple fluid density. Mediastinum/Nodes: Enlarged nodes within the upper mediastinum measure up to 15 mm in short axis (series 3, image 11). No appreciable supraclavicular adenopathy. No other pathologically enlarged mediastinal lymph nodes. Mild soft tissue density within the right hilum without frank adenopathy. Mildly enlarged 11 mm left hilar node (series 3, image 28). Enlarged axillary adenopathy seen bilaterally, with the largest node on the left measuring 2.1 cm in short axis (series 3, image 16). Largest node on the right measures 1.7 cm in short axis (series 3, image 8). Scattered soft tissue stranding with emphysema and fluid density overlying the left axilla likely related to recent lymph node biopsy, partially visualized. Esophagus within normal limits. Lungs/Pleura: Tracheobronchial tree intact and patent. Moderate layering bilateral pleural effusions with associated atelectasis. No other airspace consolidation. No pulmonary edema. No pneumothorax. 4 mm subpleural ground-glass nodule present at the anterior right upper lobe (series 4, image 57), indeterminate. No other worrisome pulmonary nodule or mass. Musculoskeletal: No acute osseous finding. No discrete lytic or blastic osseous lesions. CT ABDOMEN PELVIS FINDINGS Hepatobiliary: Liver demonstrates a normal contrast enhanced appearance. Gallbladder within normal limits. No biliary dilatation. Pancreas: Pancreas within normal limits. Spleen: Spleen  enlarged measuring 14.3 cm in craniocaudad dimension. Few subtle hypodensities noted within the spleen, largest of which measures approximately 12 mm (series 3, image 57), indeterminate. Adrenals/Urinary Tract: Adrenal glands are normal. Kidneys equal in size with symmetric enhancement. No nephrolithiasis, hydronephrosis, or focal enhancing renal mass. No hydroureter. Bladder within normal limits. Stomach/Bowel: Stomach within normal limits. No evidence for bowel obstruction. Normal appendix. No  acute inflammatory changes seen about the bowels. Vascular/Lymphatic: Normal intravascular enhancement seen throughout the intra-abdominal aorta. Mild aorto bi-iliac atherosclerotic disease. No aneurysm. Mesenteric vessels patent proximally. Enlarged 15 mm aortocaval lymph node (series 3, image 7). Additional multifocal shotty subcentimeter aortocaval and periaortic lymph nodes noted. No other pathologically enlarged intra-abdominal lymph nodes identified. Mildly prominent 12 mm left inguinal lymph nodes noted. No other adenopathy within the pelvis. Reproductive: Uterus within normal limits.  Ovaries normal. Other: Moderate volume free fluid within the pelvis, measuring simple fluid density. No free intraperitoneal air. Musculoskeletal: Scattered anasarca noted within the external soft tissues. No acute osseous finding. No discrete lytic or blastic osseous lesions. IMPRESSION: CT NECK IMPRESSION 1. Increased number of shoddy subcentimeter lymph nodes throughout the neck bilaterally, left slightly worse than right. No pathologically enlarged lymph nodes identified. 2. Mild diffuse thyromegaly without discrete thyroid nodule or mass. 3. Mild soft tissue stranding with emphysema within the right lateral neck related to recent right-sided Port-A-Cath placement. CT CHEST IMPRESSION 1. Enlarged bilateral axillary and upper mediastinal adenopathy as above, likely related to provided history of lymphoma. Additional borderline  enlarged left hilar node may be related to lymphoma or possibly reactive in nature. Attention at follow-up recommended. 2. Moderate layering bilateral pleural effusions with associated atelectasis. 3. Small to moderate pericardial effusion. 4. 4 mm right upper lobe ground-glass nodule, indeterminate. Attention at follow-up recommended. CT ABDOMEN AND PELVIS IMPRESSION 1. Enlarged 15 mm aortocaval lymph node with mildly enlarged 12 mm left inguinal lymph nodes. Additional increased number of shotty subcentimeter retroperitoneal lymph nodes. Findings most likely related to history of lymphoma. 2. Splenomegaly. 3. Moderate volume free fluid within the pelvis, of uncertain etiology, but could be physiologic and/or related overall volume status. 4. Mild diffuse anasarca. Electronically Signed   By: Jeannine Boga M.D.   On: 11/18/2018 19:56   Ct Biopsy  Result Date: 11/15/2018 INDICATION: Pancytopenia, concern for lymphoproliferative process EXAM: CT GUIDED RIGHT ILIAC BONE MARROW ASPIRATION AND CORE BIOPSY Date:  11/15/2018 11/15/2018 10:13 am Radiologist:  M. Daryll Brod, MD Guidance:  CT FLUOROSCOPY TIME:  Fluoroscopy Time: None. MEDICATIONS: 1% lidocaine local ANESTHESIA/SEDATION: 2.0 mg IV Versed; 50 mcg IV Fentanyl Moderate Sedation Time:  10 minutes The patient was continuously monitored during the procedure by the interventional radiology nurse under my direct supervision. CONTRAST:  None. COMPLICATIONS: None PROCEDURE: Informed consent was obtained from the patient following explanation of the procedure, risks, benefits and alternatives. The patient understands, agrees and consents for the procedure. All questions were addressed. A time out was performed. The patient was positioned prone and non-contrast localization CT was performed of the pelvis to demonstrate the iliac marrow spaces. Maximal barrier sterile technique utilized including caps, mask, sterile gowns, sterile gloves, large sterile drape,  hand hygiene, and Betadine prep. Under sterile conditions and local anesthesia, an 11 gauge coaxial bone biopsy needle was advanced into the right iliac marrow space. Needle position was confirmed with CT imaging. Initially, bone marrow aspiration was performed. Next, the 11 gauge outer cannula was utilized to obtain a right iliac bone marrow core biopsy. Needle was removed. Hemostasis was obtained with compression. The patient tolerated the procedure well. Samples were prepared with the cytotechnologist. No immediate complications. IMPRESSION: CT guided right iliac bone marrow aspiration and core biopsy. Electronically Signed   By: Jerilynn Mages.  Shick M.D.   On: 11/15/2018 11:25   Dg Chest Port 1 View  Result Date: 11/11/2018 CLINICAL DATA:  Fever EXAM: PORTABLE CHEST 1 VIEW COMPARISON:  None. FINDINGS: Mild cardiomegaly. No focal consolidation or effusion. No pneumothorax. IMPRESSION: No active disease. Electronically Signed   By: Donavan Foil M.D.   On: 11/11/2018 21:59   Ct Bone Marrow Biopsy & Aspiration  Result Date: 11/15/2018 INDICATION: Pancytopenia, concern for lymphoproliferative process EXAM: CT GUIDED RIGHT ILIAC BONE MARROW ASPIRATION AND CORE BIOPSY Date:  11/15/2018 11/15/2018 10:13 am Radiologist:  M. Daryll Brod, MD Guidance:  CT FLUOROSCOPY TIME:  Fluoroscopy Time: None. MEDICATIONS: 1% lidocaine local ANESTHESIA/SEDATION: 2.0 mg IV Versed; 50 mcg IV Fentanyl Moderate Sedation Time:  10 minutes The patient was continuously monitored during the procedure by the interventional radiology nurse under my direct supervision. CONTRAST:  None. COMPLICATIONS: None PROCEDURE: Informed consent was obtained from the patient following explanation of the procedure, risks, benefits and alternatives. The patient understands, agrees and consents for the procedure. All questions were addressed. A time out was performed. The patient was positioned prone and non-contrast localization CT was performed of the pelvis to  demonstrate the iliac marrow spaces. Maximal barrier sterile technique utilized including caps, mask, sterile gowns, sterile gloves, large sterile drape, hand hygiene, and Betadine prep. Under sterile conditions and local anesthesia, an 11 gauge coaxial bone biopsy needle was advanced into the right iliac marrow space. Needle position was confirmed with CT imaging. Initially, bone marrow aspiration was performed. Next, the 11 gauge outer cannula was utilized to obtain a right iliac bone marrow core biopsy. Needle was removed. Hemostasis was obtained with compression. The patient tolerated the procedure well. Samples were prepared with the cytotechnologist. No immediate complications. IMPRESSION: CT guided right iliac bone marrow aspiration and core biopsy. Electronically Signed   By: Jerilynn Mages.  Shick M.D.   On: 11/15/2018 11:25   Ir Imaging Guided Port Insertion  Result Date: 11/18/2018 INDICATION: 53 year old female with history lymphoma EXAM: IMPLANTED PORT A CATH PLACEMENT WITH ULTRASOUND AND FLUOROSCOPIC GUIDANCE MEDICATIONS: 2.0 g Ancef; The antibiotic was administered within an appropriate time interval prior to skin puncture. ANESTHESIA/SEDATION: Moderate (conscious) sedation was employed during this procedure. A total of Versed 2.0 mg and Fentanyl 100 mcg was administered intravenously. Moderate Sedation Time: 18 minutes. The patient's level of consciousness and vital signs were monitored continuously by radiology nursing throughout the procedure under my direct supervision. FLUOROSCOPY TIME:  0 minutes, 12 seconds (1.0 mGy) COMPLICATIONS: None PROCEDURE: The procedure, risks, benefits, and alternatives were explained to the patient. Questions regarding the procedure were encouraged and answered. The patient understands and consents to the procedure. Ultrasound survey was performed with images stored and sent to PACs. The right neck and chest was prepped with chlorhexidine, and draped in the usual sterile  fashion using maximum barrier technique (cap and mask, sterile gown, sterile gloves, large sterile sheet, hand hygiene and cutaneous antiseptic). Antibiotic prophylaxis was provided with 2.0g Ancef administered IV one hour prior to skin incision. Local anesthesia was attained by infiltration with 1% lidocaine without epinephrine. Ultrasound demonstrated patency of the right internal jugular vein, and this was documented with an image. Under real-time ultrasound guidance, this vein was accessed with a 21 gauge micropuncture needle and image documentation was performed. A small dermatotomy was made at the access site with an 11 scalpel. A 0.018" wire was advanced into the SVC and used to estimate the length of the internal catheter. The access needle exchanged for a 22F micropuncture vascular sheath. The 0.018" wire was then removed and a 0.035" wire advanced into the IVC. An appropriate location for the subcutaneous reservoir was selected below  the clavicle and an incision was made through the skin and underlying soft tissues. The subcutaneous tissues were then dissected using a combination of blunt and sharp surgical technique and a pocket was formed. A single lumen power injectable portacatheter was then tunneled through the subcutaneous tissues from the pocket to the dermatotomy and the port reservoir placed within the subcutaneous pocket. The venous access site was then serially dilated and a peel away vascular sheath placed over the wire. The wire was removed and the port catheter advanced into position under fluoroscopic guidance. The catheter tip is positioned in the cavoatrial junction. This was documented with a spot image. The portacatheter was then tested and found to flush and aspirate well. The port was flushed with saline followed by 100 units/mL heparinized saline. The pocket was then closed in two layers using first subdermal inverted interrupted absorbable sutures followed by a running subcuticular  suture. The epidermis was then sealed with Dermabond. The dermatotomy at the venous access site was also seal with Dermabond. Patient tolerated the procedure well and remained hemodynamically stable throughout. No complications encountered and no significant blood loss encountered IMPRESSION: Status post right IJ port catheter placement. Catheter ready for use. Signed, Dulcy Fanny. Dellia Nims, RPVI [facility] Radiology Electronically Signed   By: Corrie Mckusick D.O.   On: 11/18/2018 11:23     ASSESSMENT/PLAN:  Stage IV Hodgkin lymphoma -Port placed in 10/2018; TTE showed normal LVEF -The patient received day 1 of cycle 1 of AVD + Adcedris on 11/19/2018.; plan for Granix x 5 days, starting 11/20/2018.  Tolerated her chemotherapy well overall. -Recommend PRN anti-emetics, including Zofran and Compazine  Normocytic anemia -Likely secondary to anemia of chronic disease and underlying lymphoma -Hemoglobin has improved today to 9.3.  Status post 1 unit packed red blood cells on 11/19/2018.  No transfusion is indicated today. -Supportive transfusion to keep Hgb > 7  Thrombocytopenia -Likely secondary to underlying lymphoma -Patient denies any symptoms of bleeding -Daily CBC -Goal plts > 10k in the absence of bleeding   Persistent fever -Secondary to underlying lymphoma -Patient has undergone extensive infectious work-up that did not identify any infection -Continue to monitor it for now   Severe protein malnutrition -Patient has lost > 20 lbs since the onset of her symptoms, and her most recent albumin was 1.7 with anasarca, consistent with severe protein malnutrition -Dietitian is following. -If her nutrition remains very poor and she is unable to increase her PO intake, we will have to consider enteral nutrition, such as PEG tube, to help improve her nutritional intake  Patient's daughter, Anderson Malta, [phone], requests to have periodic  updates on the status of her mother's treatment.    LOS: 9 days   Mikey Bussing, DNP, AGPCNP-BC, AOCNP 11/20/18   ADDENDUM:  I personally examined the patient, and confirmed the history as outlined above.  Patient reports that she still has persistent fatigue and generalized weakness, but otherwise denies any new complaint today.  Her Tmax was 103 this morning, and fever had improved with Tylenol.  Patient received the Cycle 1 Day 1 of AVD + Adcetris on 11/19/2018, and has tolerated it well.  She is not due for treatment again until 12/03/2018.  Hopefully with starting chemotherapy, her constitutional symptoms, including persistent fever, would begin to improve soon.  Patient received 1 unit of RBC on 11/19/2018, with improvement in her hemoglobin to 9.3 this morning.  She denies any clinical symptoms of bleeding.  I would recommend  daily CBC to monitor her hemoglobin trend and to continue supportive transfusion as needed to keep Hgb above 7.  Unfortunately, patient does not have any insurance right now, and therefore would incur significant costs if she is seen in clinic or receives further chemotherapy.  Therefore, I would strongly recommend involving social work to help the patient obtain insurance, such as Medicaid, or some form of charity care, so that she can continue outpatient treatment of her Hodgkin lymphoma.  From an oncology standpoint, if she continues to have intermittent fever, she would be at high risk for re-admission, especially as she will begin to develop myelosuppression from the chemotherapy within the next few days (i.e. neutropenic fever).  Therefore, I would recommend monitoring the patient to ensure that she is fever-free for at least 24 to 48 hrs. without Tylenol before discharge.  Thank you for caring for our mutual patient.  Please do not hesitate contact me if there are any questions.  Tish Men, MD 11/20/2018 1:28 PM

## 2018-11-21 ENCOUNTER — Inpatient Hospital Stay (HOSPITAL_COMMUNITY): Payer: Medicaid Other

## 2018-11-21 LAB — CBC
HEMATOCRIT: 26.3 % — AB (ref 36.0–46.0)
Hemoglobin: 8.4 g/dL — ABNORMAL LOW (ref 12.0–15.0)
MCH: 27.5 pg (ref 26.0–34.0)
MCHC: 31.9 g/dL (ref 30.0–36.0)
MCV: 85.9 fL (ref 80.0–100.0)
Platelets: 68 10*3/uL — ABNORMAL LOW (ref 150–400)
RBC: 3.06 MIL/uL — ABNORMAL LOW (ref 3.87–5.11)
RDW: 19.3 % — ABNORMAL HIGH (ref 11.5–15.5)
WBC: 18.2 10*3/uL — ABNORMAL HIGH (ref 4.0–10.5)
nRBC: 0 % (ref 0.0–0.2)

## 2018-11-21 LAB — INFLUENZA PANEL BY PCR (TYPE A & B)
Influenza A By PCR: NEGATIVE
Influenza B By PCR: NEGATIVE

## 2018-11-21 LAB — BASIC METABOLIC PANEL
Anion gap: 12 (ref 5–15)
BUN: 19 mg/dL (ref 6–20)
CO2: 18 mmol/L — ABNORMAL LOW (ref 22–32)
Calcium: 7.3 mg/dL — ABNORMAL LOW (ref 8.9–10.3)
Chloride: 108 mmol/L (ref 98–111)
Creatinine, Ser: 0.86 mg/dL (ref 0.44–1.00)
GFR calc Af Amer: >60 mL/min (ref >60)
Glucose, Bld: 99 mg/dL (ref 70–99)
POTASSIUM: 3.5 mmol/L (ref 3.5–5.1)
Sodium: 138 mmol/L (ref 135–145)

## 2018-11-21 LAB — BRAIN NATRIURETIC PEPTIDE: B Natriuretic Peptide: 1731.6 pg/mL — ABNORMAL HIGH (ref 0.0–100.0)

## 2018-11-21 NOTE — Progress Notes (Signed)
[facility] INPATIENT PROGRESS NOTE  SUBJECTIVE: Cindy Ward 53 y.o. female with Stage IV Hodgkin lymphoma. Received Day 1 Cycle 1 AVD + Adcedris  on 11/19/2018. Fever up to 101.2 this am. Received Tylenol.  Still having fatigue night sweats.  No nausea or vomiting.  Has generalized swelling.  Appetite still decreased.  Still having a cough but no chest discomfort or shortness of breath.  No constipation or diarrhea.  ALLERGIES:  has No Known Allergies.  MEDICATIONS:  Current Facility-Administered Medications  Medication Dose Route Frequency Provider Last Rate Last Dose  . 0.9 %  sodium chloride infusion   Intravenous PRN Irene Pap N, DO 10 mL/hr at 11/19/18 1356 250 mL at 11/19/18 1356  . 0.9 %  sodium chloride infusion   Intravenous Continuous Kayleen Memos, DO 75 mL/hr at 11/20/18 1504    . 0.9 %  sodium chloride infusion   Intravenous Once Tish Men, MD      . acetaminophen (TYLENOL) tablet 650 mg  650 mg Oral Q6H PRN Earnstine Regal, PA-C   650 mg at 11/21/18 0600   Or  . acetaminophen (TYLENOL) suppository 650 mg  650 mg Rectal Q6H PRN Earnstine Regal, PA-C   650 mg at 11/18/18 2147  . enoxaparin (LOVENOX) injection 40 mg  40 mg Subcutaneous Q24H Monia Sabal, PA-C   40 mg at 11/20/18 1013  . feeding supplement (BOOST / RESOURCE BREEZE) liquid 1 Container  1 Container Oral TID BM Kayleen Memos, DO   1 Container at 11/18/18 2131  . HYDROmorphone (DILAUDID) injection 0.5-1 mg  0.5-1 mg Intravenous Q4H PRN Earnstine Regal, PA-C      . ibuprofen (ADVIL,MOTRIN) tablet 400 mg  400 mg Oral Q8H PRN Irene Pap N, DO   400 mg at 11/18/18 2334  . iohexol (OMNIPAQUE) 300 MG/ML solution 15 mL  15 mL Oral Once PRN Irene Pap N, DO   30 mL at 11/18/18 1740  . levothyroxine (SYNTHROID, LEVOTHROID) tablet 75 mcg  75 mcg Oral Q0600 Earnstine Regal, PA-C   75 mcg at 11/21/18 0601  . lip balm (CARMEX) ointment   Topical PRN Irene Pap N, DO      . MEDLINE mouth rinse  15  mL Mouth Rinse BID Earnstine Regal, PA-C   15 mL at 11/20/18 2100  . multivitamin with minerals tablet 1 tablet  1 tablet Oral Daily Irene Pap N, DO   1 tablet at 11/20/18 1012  . ondansetron (ZOFRAN) tablet 4 mg  4 mg Oral Q6H PRN Earnstine Regal, PA-C       Or  . ondansetron [facility]) injection 4 mg  4 mg Intravenous Q6H PRN Earnstine Regal, PA-C   4 mg at 11/12/18 2595  . oxyCODONE (Oxy IR/ROXICODONE) immediate release tablet 5-10 mg  5-10 mg Oral Q4H PRN Earnstine Regal, PA-C      . polyethylene glycol (MIRALAX / GLYCOLAX) packet 17 g  17 g Oral Daily PRN Earnstine Regal, PA-C      . protein supplement [facility] CHICKEN SOUP) powder 8 oz  8 oz Oral q12n4p Hall, Carole N, DO   8 oz at 11/20/18 1254  . scopolamine (TRANSDERM-SCOP) 1 MG/3DAYS 1.5 mg  1 patch Transdermal Q72H Earnstine Regal, PA-C   1.5 mg at 11/18/18 1722  . sodium chloride flush (NS) 0.9 % injection 10-40 mL  10-40 mL Intracatheter PRN Irene Pap N, DO      . [facility]) injection 480 mcg  480 mcg Subcutaneous q1800 Maylon Peppers,  Krista Blue, MD   480 mcg at 11/20/18 1730    REVIEW OF SYSTEMS:   Review of Systems  Review of Systems  Constitutional: Positive for chills, fever and malaise/fatigue.  HENT: Negative.   Eyes: Negative.   Respiratory: Positive for cough. Negative for shortness of breath.   Cardiovascular: Positive for leg swelling. Negative for chest pain and palpitations.  Gastrointestinal: Negative.   Genitourinary: Negative.   Musculoskeletal: Negative.   Skin: Negative.   Neurological: Negative.   Endo/Heme/Allergies:       Bruises easily.  Psychiatric/Behavioral: Negative.       PHYSICAL EXAMINATION:  Vital signs in last 24 hours: Temp:  [98.1 F (36.7 C)-103.2 F (39.6 C)] 99.8 F (37.7 C) (02/27 0654) Pulse Rate:  [76-119] 99 (02/27 0654) Resp:  [18-28] 26 (02/27 0654) BP: (113-139)/(68-81) 127/72 (02/27 0654) SpO2:  [93 %-96 %] 95 % (02/27 0654) Weight change:  Last BM Date:  11/19/18  ECOG PERFORMANCE STATUS: 1 - Symptomatic but completely ambulatory  Intake/Output from previous day: 02/26 0701 - 02/27 0700 In: 2480.9 [P.O.:1660; I.V.:820.9] Out: 1500 [Urine:1500] General: Alert, awake without distress. Head: Normocephalic atraumatic. Mouth: mucous membranes moist, pharynx normal without lesions Eyes: No scleral icterus.  Pupils are equal and round reactive to light. Resp: clear to auscultation bilaterally without rhonchi or wheezes or dullness to percussion. Cardio: regular rate and rhythm, S1, S2 normal, no murmur, click, rub or gallop.  Trace bilateral lower extremity edema. GI: soft, non-tender; bowel sounds normal; no masses,  no organomegaly Musculoskeletal: No joint deformity or effusion. Neurological: No motor, sensory deficits.  Intact deep tendon reflexes. Skin: No rashes or lesions.  Bruising noted to her arms from previous lab draws and IV sticks.  Portacath without erythema  LABORATORY DATA: Lab Results  Component Value Date   WBC 18.2 (H) 11/21/2018   HGB 8.4 (L) 11/21/2018   HCT 26.3 (L) 11/21/2018   MCV 85.9 11/21/2018   PLT 68 (L) 11/21/2018    _0 @  RADIOGRAPHIC STUDIES:  Ct Soft Tissue Neck W Contrast  Result Date: 11/18/2018 CLINICAL DATA:  Initial evaluation for Hodgkin's lymphoma, initial workup. EXAM: CT NECK WITH CONTRAST CT CHEST, ABDOMEN, AND PELVIS WITH CONTRAST TECHNIQUE: Multidetector CT imaging of the chest, abdomen and pelvis was performed following the standard protocol during bolus administration of intravenous contrast. CONTRAST:  1106m OMNIPAQUE IOHEXOL 300 MG/ML SOLN, 375mOMNIPAQUE IOHEXOL 300 MG/ML SOLN COMPARISON:  None. FINDINGS: CT NECK FINDINGS: Pharynx and larynx: Oral cavity within normal limits without mass lesion or loculated collection. Patient is largely edentulous. Calcified tonsilliths noted within the right palatine tonsil. Tonsils themselves symmetric and within normal limits bilaterally.  Parapharyngeal fat maintained. Nasopharynx normal. No retropharyngeal collection. Epiglottis normal. Vallecula clear. Remainder of the hypopharynx and supraglottic larynx normal. Glottis within normal limits. Subglottic airway clear. Salivary glands: Parotid and submandibular glands within normal limits. Thyroid: Thyroid diffusely enlarged without discrete nodule or mass. Lymph nodes: Mildly prominent level II lymph nodes measure up to 9 mm in short axis bilaterally. Left level III nodes measure up to 9 mm as well. Increased number of shotty subcentimeter nodes within the neck bilaterally, left slightly worse than right. No other pathologically enlarged lymph nodes identified within the neck. Vascular: Normal intravascular enhancement seen throughout the neck. Right IJ approach central venous catheter in place. Soft tissue swelling with stranding and scattered foci of soft tissue emphysema within the right neck likely related to central line placement. Limited intracranial: Unremarkable Visualized orbits: Partially visualized inferior globes  and orbits unremarkable. Mastoids and visualized paranasal sinuses: Mucosal thickening noted within the visualized maxillary and sphenoid sinuses. Visualized paranasal sinuses are otherwise clear. Visualize mastoids and middle ear cavities are clear. Skeleton: No acute osseous abnormality. No discrete lytic or blastic osseous lesions. Mild cervical spondylolysis present at C5-6. Other: None. CT CHEST FINDINGS Cardiovascular: Normal intravascular enhancement seen throughout the intra-abdominal aorta without aneurysm or other acute abnormality. Minimal atheromatous plaque within the aortic arch. Visualized great vessels within normal limits. Heart size normal. Small moderate pericardial effusion, simple fluid density. Mediastinum/Nodes: Enlarged nodes within the upper mediastinum measure up to 15 mm in short axis (series 3, image 11). No appreciable supraclavicular adenopathy. No  other pathologically enlarged mediastinal lymph nodes. Mild soft tissue density within the right hilum without frank adenopathy. Mildly enlarged 11 mm left hilar node (series 3, image 28). Enlarged axillary adenopathy seen bilaterally, with the largest node on the left measuring 2.1 cm in short axis (series 3, image 16). Largest node on the right measures 1.7 cm in short axis (series 3, image 8). Scattered soft tissue stranding with emphysema and fluid density overlying the left axilla likely related to recent lymph node biopsy, partially visualized. Esophagus within normal limits. Lungs/Pleura: Tracheobronchial tree intact and patent. Moderate layering bilateral pleural effusions with associated atelectasis. No other airspace consolidation. No pulmonary edema. No pneumothorax. 4 mm subpleural ground-glass nodule present at the anterior right upper lobe (series 4, image 57), indeterminate. No other worrisome pulmonary nodule or mass. Musculoskeletal: No acute osseous finding. No discrete lytic or blastic osseous lesions. CT ABDOMEN PELVIS FINDINGS Hepatobiliary: Liver demonstrates a normal contrast enhanced appearance. Gallbladder within normal limits. No biliary dilatation. Pancreas: Pancreas within normal limits. Spleen: Spleen enlarged measuring 14.3 cm in craniocaudad dimension. Few subtle hypodensities noted within the spleen, largest of which measures approximately 12 mm (series 3, image 57), indeterminate. Adrenals/Urinary Tract: Adrenal glands are normal. Kidneys equal in size with symmetric enhancement. No nephrolithiasis, hydronephrosis, or focal enhancing renal mass. No hydroureter. Bladder within normal limits. Stomach/Bowel: Stomach within normal limits. No evidence for bowel obstruction. Normal appendix. No acute inflammatory changes seen about the bowels. Vascular/Lymphatic: Normal intravascular enhancement seen throughout the intra-abdominal aorta. Mild aorto bi-iliac atherosclerotic disease. No  aneurysm. Mesenteric vessels patent proximally. Enlarged 15 mm aortocaval lymph node (series 3, image 7). Additional multifocal shotty subcentimeter aortocaval and periaortic lymph nodes noted. No other pathologically enlarged intra-abdominal lymph nodes identified. Mildly prominent 12 mm left inguinal lymph nodes noted. No other adenopathy within the pelvis. Reproductive: Uterus within normal limits.  Ovaries normal. Other: Moderate volume free fluid within the pelvis, measuring simple fluid density. No free intraperitoneal air. Musculoskeletal: Scattered anasarca noted within the external soft tissues. No acute osseous finding. No discrete lytic or blastic osseous lesions. IMPRESSION: CT NECK IMPRESSION 1. Increased number of shoddy subcentimeter lymph nodes throughout the neck bilaterally, left slightly worse than right. No pathologically enlarged lymph nodes identified. 2. Mild diffuse thyromegaly without discrete thyroid nodule or mass. 3. Mild soft tissue stranding with emphysema within the right lateral neck related to recent right-sided Port-A-Cath placement. CT CHEST IMPRESSION 1. Enlarged bilateral axillary and upper mediastinal adenopathy as above, likely related to provided history of lymphoma. Additional borderline enlarged left hilar node may be related to lymphoma or possibly reactive in nature. Attention at follow-up recommended. 2. Moderate layering bilateral pleural effusions with associated atelectasis. 3. Small to moderate pericardial effusion. 4. 4 mm right upper lobe ground-glass nodule, indeterminate. Attention at follow-up recommended. CT ABDOMEN  AND PELVIS IMPRESSION 1. Enlarged 15 mm aortocaval lymph node with mildly enlarged 12 mm left inguinal lymph nodes. Additional increased number of shotty subcentimeter retroperitoneal lymph nodes. Findings most likely related to history of lymphoma. 2. Splenomegaly. 3. Moderate volume free fluid within the pelvis, of uncertain etiology, but could be  physiologic and/or related overall volume status. 4. Mild diffuse anasarca. Electronically Signed   By: Jeannine Boga M.D.   On: 11/18/2018 19:56   Ct Chest W Contrast  Result Date: 11/18/2018 CLINICAL DATA:  Initial evaluation for Hodgkin's lymphoma, initial workup. EXAM: CT NECK WITH CONTRAST CT CHEST, ABDOMEN, AND PELVIS WITH CONTRAST TECHNIQUE: Multidetector CT imaging of the chest, abdomen and pelvis was performed following the standard protocol during bolus administration of intravenous contrast. CONTRAST:  140m OMNIPAQUE IOHEXOL 300 MG/ML SOLN, 335mOMNIPAQUE IOHEXOL 300 MG/ML SOLN COMPARISON:  None. FINDINGS: CT NECK FINDINGS: Pharynx and larynx: Oral cavity within normal limits without mass lesion or loculated collection. Patient is largely edentulous. Calcified tonsilliths noted within the right palatine tonsil. Tonsils themselves symmetric and within normal limits bilaterally. Parapharyngeal fat maintained. Nasopharynx normal. No retropharyngeal collection. Epiglottis normal. Vallecula clear. Remainder of the hypopharynx and supraglottic larynx normal. Glottis within normal limits. Subglottic airway clear. Salivary glands: Parotid and submandibular glands within normal limits. Thyroid: Thyroid diffusely enlarged without discrete nodule or mass. Lymph nodes: Mildly prominent level II lymph nodes measure up to 9 mm in short axis bilaterally. Left level III nodes measure up to 9 mm as well. Increased number of shotty subcentimeter nodes within the neck bilaterally, left slightly worse than right. No other pathologically enlarged lymph nodes identified within the neck. Vascular: Normal intravascular enhancement seen throughout the neck. Right IJ approach central venous catheter in place. Soft tissue swelling with stranding and scattered foci of soft tissue emphysema within the right neck likely related to central line placement. Limited intracranial: Unremarkable Visualized orbits: Partially  visualized inferior globes and orbits unremarkable. Mastoids and visualized paranasal sinuses: Mucosal thickening noted within the visualized maxillary and sphenoid sinuses. Visualized paranasal sinuses are otherwise clear. Visualize mastoids and middle ear cavities are clear. Skeleton: No acute osseous abnormality. No discrete lytic or blastic osseous lesions. Mild cervical spondylolysis present at C5-6. Other: None. CT CHEST FINDINGS Cardiovascular: Normal intravascular enhancement seen throughout the intra-abdominal aorta without aneurysm or other acute abnormality. Minimal atheromatous plaque within the aortic arch. Visualized great vessels within normal limits. Heart size normal. Small moderate pericardial effusion, simple fluid density. Mediastinum/Nodes: Enlarged nodes within the upper mediastinum measure up to 15 mm in short axis (series 3, image 11). No appreciable supraclavicular adenopathy. No other pathologically enlarged mediastinal lymph nodes. Mild soft tissue density within the right hilum without frank adenopathy. Mildly enlarged 11 mm left hilar node (series 3, image 28). Enlarged axillary adenopathy seen bilaterally, with the largest node on the left measuring 2.1 cm in short axis (series 3, image 16). Largest node on the right measures 1.7 cm in short axis (series 3, image 8). Scattered soft tissue stranding with emphysema and fluid density overlying the left axilla likely related to recent lymph node biopsy, partially visualized. Esophagus within normal limits. Lungs/Pleura: Tracheobronchial tree intact and patent. Moderate layering bilateral pleural effusions with associated atelectasis. No other airspace consolidation. No pulmonary edema. No pneumothorax. 4 mm subpleural ground-glass nodule present at the anterior right upper lobe (series 4, image 57), indeterminate. No other worrisome pulmonary nodule or mass. Musculoskeletal: No acute osseous finding. No discrete lytic or blastic osseous  lesions.  CT ABDOMEN PELVIS FINDINGS Hepatobiliary: Liver demonstrates a normal contrast enhanced appearance. Gallbladder within normal limits. No biliary dilatation. Pancreas: Pancreas within normal limits. Spleen: Spleen enlarged measuring 14.3 cm in craniocaudad dimension. Few subtle hypodensities noted within the spleen, largest of which measures approximately 12 mm (series 3, image 57), indeterminate. Adrenals/Urinary Tract: Adrenal glands are normal. Kidneys equal in size with symmetric enhancement. No nephrolithiasis, hydronephrosis, or focal enhancing renal mass. No hydroureter. Bladder within normal limits. Stomach/Bowel: Stomach within normal limits. No evidence for bowel obstruction. Normal appendix. No acute inflammatory changes seen about the bowels. Vascular/Lymphatic: Normal intravascular enhancement seen throughout the intra-abdominal aorta. Mild aorto bi-iliac atherosclerotic disease. No aneurysm. Mesenteric vessels patent proximally. Enlarged 15 mm aortocaval lymph node (series 3, image 7). Additional multifocal shotty subcentimeter aortocaval and periaortic lymph nodes noted. No other pathologically enlarged intra-abdominal lymph nodes identified. Mildly prominent 12 mm left inguinal lymph nodes noted. No other adenopathy within the pelvis. Reproductive: Uterus within normal limits.  Ovaries normal. Other: Moderate volume free fluid within the pelvis, measuring simple fluid density. No free intraperitoneal air. Musculoskeletal: Scattered anasarca noted within the external soft tissues. No acute osseous finding. No discrete lytic or blastic osseous lesions. IMPRESSION: CT NECK IMPRESSION 1. Increased number of shoddy subcentimeter lymph nodes throughout the neck bilaterally, left slightly worse than right. No pathologically enlarged lymph nodes identified. 2. Mild diffuse thyromegaly without discrete thyroid nodule or mass. 3. Mild soft tissue stranding with emphysema within the right lateral neck  related to recent right-sided Port-A-Cath placement. CT CHEST IMPRESSION 1. Enlarged bilateral axillary and upper mediastinal adenopathy as above, likely related to provided history of lymphoma. Additional borderline enlarged left hilar node may be related to lymphoma or possibly reactive in nature. Attention at follow-up recommended. 2. Moderate layering bilateral pleural effusions with associated atelectasis. 3. Small to moderate pericardial effusion. 4. 4 mm right upper lobe ground-glass nodule, indeterminate. Attention at follow-up recommended. CT ABDOMEN AND PELVIS IMPRESSION 1. Enlarged 15 mm aortocaval lymph node with mildly enlarged 12 mm left inguinal lymph nodes. Additional increased number of shotty subcentimeter retroperitoneal lymph nodes. Findings most likely related to history of lymphoma. 2. Splenomegaly. 3. Moderate volume free fluid within the pelvis, of uncertain etiology, but could be physiologic and/or related overall volume status. 4. Mild diffuse anasarca. Electronically Signed   By: Jeannine Boga M.D.   On: 11/18/2018 19:56   Ct Abdomen Pelvis W Contrast  Result Date: 11/18/2018 CLINICAL DATA:  Initial evaluation for Hodgkin's lymphoma, initial workup. EXAM: CT NECK WITH CONTRAST CT CHEST, ABDOMEN, AND PELVIS WITH CONTRAST TECHNIQUE: Multidetector CT imaging of the chest, abdomen and pelvis was performed following the standard protocol during bolus administration of intravenous contrast. CONTRAST:  112m OMNIPAQUE IOHEXOL 300 MG/ML SOLN, 379mOMNIPAQUE IOHEXOL 300 MG/ML SOLN COMPARISON:  None. FINDINGS: CT NECK FINDINGS: Pharynx and larynx: Oral cavity within normal limits without mass lesion or loculated collection. Patient is largely edentulous. Calcified tonsilliths noted within the right palatine tonsil. Tonsils themselves symmetric and within normal limits bilaterally. Parapharyngeal fat maintained. Nasopharynx normal. No retropharyngeal collection. Epiglottis normal.  Vallecula clear. Remainder of the hypopharynx and supraglottic larynx normal. Glottis within normal limits. Subglottic airway clear. Salivary glands: Parotid and submandibular glands within normal limits. Thyroid: Thyroid diffusely enlarged without discrete nodule or mass. Lymph nodes: Mildly prominent level II lymph nodes measure up to 9 mm in short axis bilaterally. Left level III nodes measure up to 9 mm as well. Increased number of shotty subcentimeter nodes within the  neck bilaterally, left slightly worse than right. No other pathologically enlarged lymph nodes identified within the neck. Vascular: Normal intravascular enhancement seen throughout the neck. Right IJ approach central venous catheter in place. Soft tissue swelling with stranding and scattered foci of soft tissue emphysema within the right neck likely related to central line placement. Limited intracranial: Unremarkable Visualized orbits: Partially visualized inferior globes and orbits unremarkable. Mastoids and visualized paranasal sinuses: Mucosal thickening noted within the visualized maxillary and sphenoid sinuses. Visualized paranasal sinuses are otherwise clear. Visualize mastoids and middle ear cavities are clear. Skeleton: No acute osseous abnormality. No discrete lytic or blastic osseous lesions. Mild cervical spondylolysis present at C5-6. Other: None. CT CHEST FINDINGS Cardiovascular: Normal intravascular enhancement seen throughout the intra-abdominal aorta without aneurysm or other acute abnormality. Minimal atheromatous plaque within the aortic arch. Visualized great vessels within normal limits. Heart size normal. Small moderate pericardial effusion, simple fluid density. Mediastinum/Nodes: Enlarged nodes within the upper mediastinum measure up to 15 mm in short axis (series 3, image 11). No appreciable supraclavicular adenopathy. No other pathologically enlarged mediastinal lymph nodes. Mild soft tissue density within the right  hilum without frank adenopathy. Mildly enlarged 11 mm left hilar node (series 3, image 28). Enlarged axillary adenopathy seen bilaterally, with the largest node on the left measuring 2.1 cm in short axis (series 3, image 16). Largest node on the right measures 1.7 cm in short axis (series 3, image 8). Scattered soft tissue stranding with emphysema and fluid density overlying the left axilla likely related to recent lymph node biopsy, partially visualized. Esophagus within normal limits. Lungs/Pleura: Tracheobronchial tree intact and patent. Moderate layering bilateral pleural effusions with associated atelectasis. No other airspace consolidation. No pulmonary edema. No pneumothorax. 4 mm subpleural ground-glass nodule present at the anterior right upper lobe (series 4, image 57), indeterminate. No other worrisome pulmonary nodule or mass. Musculoskeletal: No acute osseous finding. No discrete lytic or blastic osseous lesions. CT ABDOMEN PELVIS FINDINGS Hepatobiliary: Liver demonstrates a normal contrast enhanced appearance. Gallbladder within normal limits. No biliary dilatation. Pancreas: Pancreas within normal limits. Spleen: Spleen enlarged measuring 14.3 cm in craniocaudad dimension. Few subtle hypodensities noted within the spleen, largest of which measures approximately 12 mm (series 3, image 57), indeterminate. Adrenals/Urinary Tract: Adrenal glands are normal. Kidneys equal in size with symmetric enhancement. No nephrolithiasis, hydronephrosis, or focal enhancing renal mass. No hydroureter. Bladder within normal limits. Stomach/Bowel: Stomach within normal limits. No evidence for bowel obstruction. Normal appendix. No acute inflammatory changes seen about the bowels. Vascular/Lymphatic: Normal intravascular enhancement seen throughout the intra-abdominal aorta. Mild aorto bi-iliac atherosclerotic disease. No aneurysm. Mesenteric vessels patent proximally. Enlarged 15 mm aortocaval lymph node (series 3, image  7). Additional multifocal shotty subcentimeter aortocaval and periaortic lymph nodes noted. No other pathologically enlarged intra-abdominal lymph nodes identified. Mildly prominent 12 mm left inguinal lymph nodes noted. No other adenopathy within the pelvis. Reproductive: Uterus within normal limits.  Ovaries normal. Other: Moderate volume free fluid within the pelvis, measuring simple fluid density. No free intraperitoneal air. Musculoskeletal: Scattered anasarca noted within the external soft tissues. No acute osseous finding. No discrete lytic or blastic osseous lesions. IMPRESSION: CT NECK IMPRESSION 1. Increased number of shoddy subcentimeter lymph nodes throughout the neck bilaterally, left slightly worse than right. No pathologically enlarged lymph nodes identified. 2. Mild diffuse thyromegaly without discrete thyroid nodule or mass. 3. Mild soft tissue stranding with emphysema within the right lateral neck related to recent right-sided Port-A-Cath placement. CT CHEST IMPRESSION 1. Enlarged bilateral  axillary and upper mediastinal adenopathy as above, likely related to provided history of lymphoma. Additional borderline enlarged left hilar node may be related to lymphoma or possibly reactive in nature. Attention at follow-up recommended. 2. Moderate layering bilateral pleural effusions with associated atelectasis. 3. Small to moderate pericardial effusion. 4. 4 mm right upper lobe ground-glass nodule, indeterminate. Attention at follow-up recommended. CT ABDOMEN AND PELVIS IMPRESSION 1. Enlarged 15 mm aortocaval lymph node with mildly enlarged 12 mm left inguinal lymph nodes. Additional increased number of shotty subcentimeter retroperitoneal lymph nodes. Findings most likely related to history of lymphoma. 2. Splenomegaly. 3. Moderate volume free fluid within the pelvis, of uncertain etiology, but could be physiologic and/or related overall volume status. 4. Mild diffuse anasarca. Electronically Signed    By: Jeannine Boga M.D.   On: 11/18/2018 19:56   Ct Biopsy  Result Date: 11/15/2018 INDICATION: Pancytopenia, concern for lymphoproliferative process EXAM: CT GUIDED RIGHT ILIAC BONE MARROW ASPIRATION AND CORE BIOPSY Date:  11/15/2018 11/15/2018 10:13 am Radiologist:  M. Daryll Brod, MD Guidance:  CT FLUOROSCOPY TIME:  Fluoroscopy Time: None. MEDICATIONS: 1% lidocaine local ANESTHESIA/SEDATION: 2.0 mg IV Versed; 50 mcg IV Fentanyl Moderate Sedation Time:  10 minutes The patient was continuously monitored during the procedure by the interventional radiology nurse under my direct supervision. CONTRAST:  None. COMPLICATIONS: None PROCEDURE: Informed consent was obtained from the patient following explanation of the procedure, risks, benefits and alternatives. The patient understands, agrees and consents for the procedure. All questions were addressed. A time out was performed. The patient was positioned prone and non-contrast localization CT was performed of the pelvis to demonstrate the iliac marrow spaces. Maximal barrier sterile technique utilized including caps, mask, sterile gowns, sterile gloves, large sterile drape, hand hygiene, and Betadine prep. Under sterile conditions and local anesthesia, an 11 gauge coaxial bone biopsy needle was advanced into the right iliac marrow space. Needle position was confirmed with CT imaging. Initially, bone marrow aspiration was performed. Next, the 11 gauge outer cannula was utilized to obtain a right iliac bone marrow core biopsy. Needle was removed. Hemostasis was obtained with compression. The patient tolerated the procedure well. Samples were prepared with the cytotechnologist. No immediate complications. IMPRESSION: CT guided right iliac bone marrow aspiration and core biopsy. Electronically Signed   By: Jerilynn Mages.  Shick M.D.   On: 11/15/2018 11:25   Dg Chest Port 1 View  Result Date: 11/11/2018 CLINICAL DATA:  Fever EXAM: PORTABLE CHEST 1 VIEW COMPARISON:  None.  FINDINGS: Mild cardiomegaly. No focal consolidation or effusion. No pneumothorax. IMPRESSION: No active disease. Electronically Signed   By: Donavan Foil M.D.   On: 11/11/2018 21:59   Ct Bone Marrow Biopsy & Aspiration  Result Date: 11/15/2018 INDICATION: Pancytopenia, concern for lymphoproliferative process EXAM: CT GUIDED RIGHT ILIAC BONE MARROW ASPIRATION AND CORE BIOPSY Date:  11/15/2018 11/15/2018 10:13 am Radiologist:  M. Daryll Brod, MD Guidance:  CT FLUOROSCOPY TIME:  Fluoroscopy Time: None. MEDICATIONS: 1% lidocaine local ANESTHESIA/SEDATION: 2.0 mg IV Versed; 50 mcg IV Fentanyl Moderate Sedation Time:  10 minutes The patient was continuously monitored during the procedure by the interventional radiology nurse under my direct supervision. CONTRAST:  None. COMPLICATIONS: None PROCEDURE: Informed consent was obtained from the patient following explanation of the procedure, risks, benefits and alternatives. The patient understands, agrees and consents for the procedure. All questions were addressed. A time out was performed. The patient was positioned prone and non-contrast localization CT was performed of the pelvis to demonstrate the iliac marrow spaces.  Maximal barrier sterile technique utilized including caps, mask, sterile gowns, sterile gloves, large sterile drape, hand hygiene, and Betadine prep. Under sterile conditions and local anesthesia, an 11 gauge coaxial bone biopsy needle was advanced into the right iliac marrow space. Needle position was confirmed with CT imaging. Initially, bone marrow aspiration was performed. Next, the 11 gauge outer cannula was utilized to obtain a right iliac bone marrow core biopsy. Needle was removed. Hemostasis was obtained with compression. The patient tolerated the procedure well. Samples were prepared with the cytotechnologist. No immediate complications. IMPRESSION: CT guided right iliac bone marrow aspiration and core biopsy. Electronically Signed   By: Jerilynn Mages.   Shick M.D.   On: 11/15/2018 11:25   Ir Imaging Guided Port Insertion  Result Date: 11/18/2018 INDICATION: 53 year old female with history lymphoma EXAM: IMPLANTED PORT A CATH PLACEMENT WITH ULTRASOUND AND FLUOROSCOPIC GUIDANCE MEDICATIONS: 2.0 g Ancef; The antibiotic was administered within an appropriate time interval prior to skin puncture. ANESTHESIA/SEDATION: Moderate (conscious) sedation was employed during this procedure. A total of Versed 2.0 mg and Fentanyl 100 mcg was administered intravenously. Moderate Sedation Time: 18 minutes. The patient's level of consciousness and vital signs were monitored continuously by radiology nursing throughout the procedure under my direct supervision. FLUOROSCOPY TIME:  0 minutes, 12 seconds (1.0 mGy) COMPLICATIONS: None PROCEDURE: The procedure, risks, benefits, and alternatives were explained to the patient. Questions regarding the procedure were encouraged and answered. The patient understands and consents to the procedure. Ultrasound survey was performed with images stored and sent to PACs. The right neck and chest was prepped with chlorhexidine, and draped in the usual sterile fashion using maximum barrier technique (cap and mask, sterile gown, sterile gloves, large sterile sheet, hand hygiene and cutaneous antiseptic). Antibiotic prophylaxis was provided with 2.0g Ancef administered IV one hour prior to skin incision. Local anesthesia was attained by infiltration with 1% lidocaine without epinephrine. Ultrasound demonstrated patency of the right internal jugular vein, and this was documented with an image. Under real-time ultrasound guidance, this vein was accessed with a 21 gauge micropuncture needle and image documentation was performed. A small dermatotomy was made at the access site with an 11 scalpel. A 0.018" wire was advanced into the SVC and used to estimate the length of the internal catheter. The access needle exchanged for a 61F micropuncture vascular  sheath. The 0.018" wire was then removed and a 0.035" wire advanced into the IVC. An appropriate location for the subcutaneous reservoir was selected below the clavicle and an incision was made through the skin and underlying soft tissues. The subcutaneous tissues were then dissected using a combination of blunt and sharp surgical technique and a pocket was formed. A single lumen power injectable portacatheter was then tunneled through the subcutaneous tissues from the pocket to the dermatotomy and the port reservoir placed within the subcutaneous pocket. The venous access site was then serially dilated and a peel away vascular sheath placed over the wire. The wire was removed and the port catheter advanced into position under fluoroscopic guidance. The catheter tip is positioned in the cavoatrial junction. This was documented with a spot image. The portacatheter was then tested and found to flush and aspirate well. The port was flushed with saline followed by 100 units/mL heparinized saline. The pocket was then closed in two layers using first subdermal inverted interrupted absorbable sutures followed by a running subcuticular suture. The epidermis was then sealed with Dermabond. The dermatotomy at the venous access site was also seal with Dermabond. Patient  tolerated the procedure well and remained hemodynamically stable throughout. No complications encountered and no significant blood loss encountered IMPRESSION: Status post right IJ port catheter placement. Catheter ready for use. Signed, Dulcy Fanny. Dellia Nims, RPVI [facility] Radiology Electronically Signed   By: Corrie Mckusick D.O.   On: 11/18/2018 11:23     ASSESSMENT/PLAN:  Stage IV Hodgkin lymphoma -Port placed in 10/2018; TTE showed normal LVEF -The patient received day 1 of cycle 1 of AVD + Adcedris on 11/19/2018.; plan for Granix x 5 days, starting 11/20/2018.  Tolerated her chemotherapy well  overall. -Recommend PRN anti-emetics, including Zofran and Compazine  Normocytic anemia -Likely secondary to anemia of chronic disease and underlying lymphoma -Hemoglobin is 8.4.  Active bleeding. Status post 1 unit packed red blood cells on 11/19/2018.  No transfusion is indicated today. -Supportive transfusion to keep Hgb > 7  Thrombocytopenia -Likely secondary to underlying lymphoma and recent chemotherapy. -Patient denies any symptoms of bleeding -Daily CBC -Goal plts >10k in the absence of bleeding   Persistent fever -Secondary to underlying lymphoma -Patient has undergone extensive infectious work-up that did not identify any infection -Continue to monitor it for now - Recommend monitoring the patient to ensure that she is fever-free for at least 24 to 48 hrs. without Tylenol before discharge.  Severe protein malnutrition -Patient has lost > 20 lbs since the onset of her symptoms, and her most recent albumin was 1.7 with anasarca, consistent with severe protein malnutrition -Dietitian is following. -If her nutrition remains very poor and she is unable to increase her PO intake, we will have to consider enteral nutrition, such as PEG tube, to help improve her nutritional intake  Patient's daughter, Anderson Malta, [phone], requests to have periodic updates on the status of her mother's treatment. I provided an update to her daughter via telephone on 11/20/2018.  We will plan to update her again on 11/22/2018.   LOS: 10 days   Mikey Bussing, DNP, AGPCNP-BC, AOCNP 11/21/18

## 2018-11-21 NOTE — Progress Notes (Signed)
[facility]                                                                              Patient Demographics  Cindy Ward, is a 53 y.o. female, DOB - 1966-09-17, STM:[id number]  Admit date - 11/11/2018   Admitting Physician A Melven Sartorius., MD  Outpatient Primary MD for the patient is [name], MD  Outpatient specialists:   LOS - 10  days   Medical records reviewed and are as summarized below:    No chief complaint on file.      Brief summary   53 y.o.femalewith medical history significant ofhypertension hypothyroidism presents at [facility] with recurrent nausea, vomiting, fevers, and night sweats of 9 months duration.She notes that the symptoms occur about every 2 weeks. She has nightly fevers and night sweats. The symptoms occur for days at a time, but have been taking longer and longer to resolve.She came to the hospital today because she passed out at the cancer center. She describes getting dizzy and then hitting the floor. She was seen in the emergency department at [facility] Her vitals were notable for a fever to 102.7. Multiple enlarged lymph nodes noted on CT abdomen and pelvis. The case was discussed with [name] who recommended transfer to [facility] for further workup as well as LDH, haptoglobin, and retics. 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 Hospital course complicated by recurrent high fevers related to malignancy.   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, stage IV -Patient presented with persistent fevers, lymphadenopathy, no active infective process -Lymph node biopsies on 2/18 showed classical Hodgkin lymphoma -CT chest abdomen and pelvis, neck showed a diffuse lymphadenopathy involving the cervical, thoracic and abdominal lymph nodes -Port-A-Cath placed on 2/24, started on chemotherapy on 2/25, next cycle  after 2 weeks,  -Overnight spiked temp 101.2 F earlier this morning, white count trended up to 18.2, possibly due to G-CSF - will repeat blood cultures, flu panel, chest x-ray, BNP -On exam bibasilar crackles, I's and O's with 13.7 L positive, DC IV fluids   Active Problems: Normocytic anemia with thrombocytopenia, splenomegaly -Likely secondary to underlying lymphoma -Supportive transfusion to keep hemoglobin above 7, goal platelets > 10K -H&H stable  Severe protein calorie malnutrition in the setting of malignancy -Dietitian following, continue nutritional supplements  Small pericardial effusion 2D echo on 2/18 showed EF of 55 to 60%, small pericardial effusion most prominently along the RV/RA, stable, no tamponade physiology Outpatient follow-up with periodic EKGs  Near syncope Likely secondary to anemia, dehydration, #1  Hypothyroidism Continue Synthroid   Code Status: Full code DVT Prophylaxis:  Lovenox  Family Communication: Discussed in detail with the patient, all imaging results, lab results explained to the patient   Disposition Plan: When fever free for at least 24 hours, case management consult placed for financial assistance.  Time Spent in minutes 25 minutes  Procedures:  2D echo  Consultants:   Oncology  Antimicrobials:   Anti-infectives (From admission, onward)   Start     Dose/Rate Route Frequency Ordered Stop  11/18/18 0600  ceFAZolin (ANCEF) IVPB 2g/100 mL premix     2 g 200 mL/hr over 30 Minutes Intravenous To Radiology 11/17/18 1243 11/18/18 1130   11/12/18 0800  cefTRIAXone (ROCEPHIN) 2 g in sodium chloride 0.9 % 100 mL IVPB  Status:  Discontinued     2 g 200 mL/hr over 30 Minutes Intravenous Every 24 hours 11/11/18 1711 11/12/18 1057         Medications  Scheduled Meds: . enoxaparin (LOVENOX) injection  40 mg Subcutaneous Q24H  . feeding supplement  1 Container Oral TID BM  . levothyroxine  75 mcg Oral Q0600  . mouth rinse  15 mL  Mouth Rinse BID  . multivitamin with minerals  1 tablet Oral Daily  . protein supplement  8 oz Oral q12n4p  . scopolamine  1 patch Transdermal Q72H  . Tbo-filgastrim (GRANIX) SQ  480 mcg Subcutaneous q1800   Continuous Infusions: . sodium chloride 250 mL (11/19/18 1356)  . sodium chloride 75 mL/hr at 11/21/18 1144  . sodium chloride     PRN Meds:.sodium chloride, acetaminophen **OR** acetaminophen, HYDROmorphone (DILAUDID) injection, ibuprofen, iohexol, lip balm, ondansetron **OR** ondansetron (ZOFRAN) IV, oxyCODONE, polyethylene glycol, sodium chloride flush      Subjective:   Cindy Ward was seen and examined today.  Feeling well, early this morning temp of 101.2 F.  Still some coughing, feeling weak.  Denies any chest pain, abdominal pain, N/V/D/C.   Objective:   Vitals:   11/21/18 0130 11/21/18 0527 11/21/18 0654 11/21/18 0957  BP:  139/75 127/72 118/75  Pulse:  (!) 103 99 82  Resp: 18 (!) 28 (!) 26 17  Temp:  (!) 101.2 F (38.4 C) 99.8 F (37.7 C) 99.7 F (37.6 C)  TempSrc:  Oral Oral Oral  SpO2:  93% 95% 97%  Weight:      Height:        Intake/Output Summary (Last 24 hours) at 11/21/2018 1339 Last data filed at 11/21/2018 0600 Gross per 24 hour  Intake 2000.89 ml  Output 950 ml  Net 1050.89 ml     Wt Readings from Last 3 Encounters:  11/20/18 78.5 kg  06/27/18 78.4 kg  05/14/18 79.4 kg    Physical Exam  General: Alert and oriented x 3, ill-appearing  Eyes:   HEENT:  Atraumatic, normocephalic  Cardiovascular: S1 S2 clear, RRR. 1+ pedal edema b/l  Respiratory: Bibasilar crackles  Gastrointestinal: Soft, nontender, nondistended, NBS  Ext: 1+ pedal edema bilaterally  Neuro: no new deficits  Musculoskeletal: No cyanosis, clubbing  Skin: No rashes  Psych: Normal affect and demeanor, alert and oriented x3     Data Reviewed:  I have personally reviewed following labs and imaging studies  Micro Results Recent Results (from the past 240  hour(s))  MRSA PCR Screening     Status: None   Collection Time: 11/11/18  4:21 PM  Result Value Ref Range Status   MRSA by PCR NEGATIVE NEGATIVE Final    Comment:        The GeneXpert MRSA Assay (FDA approved for NASAL specimens only), is one component of a comprehensive MRSA colonization surveillance program. It is not intended to diagnose MRSA infection nor to guide or monitor treatment for MRSA infections. Performed at [facility], [place] [address], [place], [place] [address]   Culture, blood (routine x 2)     Status: None   Collection Time: 11/13/18  3:54 PM  Result Value Ref Range Status   Specimen Description  Final    BLOOD RIGHT ANTECUBITAL Performed at [place] [address], [place], [place] [address]    Special Requests   Final    BOTTLES DRAWN AEROBIC AND ANAEROBIC Blood Culture adequate volume Performed at [place] [address] [place], [place] [address]    Culture   Final    NO GROWTH 5 DAYS Performed at [facility], [place] [address] [place], [place] [address]    Report Status 11/18/2018 FINAL  Final  Culture, blood (routine x 2)     Status: None   Collection Time: 11/13/18  4:02 PM  Result Value Ref Range Status   Specimen Description   Final    BLOOD RIGHT FOREARM Performed at [facility], [place] [address] [place], [place] [address]    Special Requests   Final    BOTTLES DRAWN AEROBIC AND ANAEROBIC Blood Culture adequate volume Performed at [place] [address] [place], [place] [address]    Culture   Final    NO GROWTH 5 DAYS Performed at [facility], [place] [address], [place], [place] [address]    Report Status 11/18/2018 FINAL  Final  Culture, Urine     Status: None   Collection Time: 11/14/18  4:20 PM  Result Value Ref Range Status   Specimen Description   Final    URINE, CLEAN CATCH Performed at [facility],  [place] [address] [place], [place] [address]    Special Requests   Final    NONE Performed at [facility], [place] [address], [place], [place] [address]    Culture   Final    NO GROWTH Performed at [facility], [place] [address], [place],  [address]    Report Status 11/15/2018 FINAL  Final    Radiology Reports Ct Soft Tissue Neck W Contrast  Result Date: 11/18/2018 CLINICAL DATA:  Initial evaluation for Hodgkin's lymphoma, initial workup. EXAM: CT NECK WITH CONTRAST CT CHEST, ABDOMEN, AND PELVIS WITH CONTRAST TECHNIQUE: Multidetector CT imaging of the chest, abdomen and pelvis was performed following the standard protocol during bolus administration of intravenous contrast. CONTRAST:  167m OMNIPAQUE IOHEXOL 300 MG/ML SOLN, 355mOMNIPAQUE IOHEXOL 300 MG/ML SOLN COMPARISON:  None. FINDINGS: CT NECK FINDINGS: Pharynx and larynx: Oral cavity within normal limits without mass lesion or loculated collection. Patient is largely edentulous. Calcified tonsilliths noted within the right palatine tonsil. Tonsils themselves symmetric and within normal limits bilaterally. Parapharyngeal fat maintained. Nasopharynx normal. No retropharyngeal collection. Epiglottis normal. Vallecula clear. Remainder of the hypopharynx and supraglottic larynx normal. Glottis within normal limits. Subglottic airway clear. Salivary glands: Parotid and submandibular glands within normal limits. Thyroid: Thyroid diffusely enlarged without discrete nodule or mass. Lymph nodes: Mildly prominent level II lymph nodes measure up to 9 mm in short axis bilaterally. Left level III nodes measure up to 9 mm as well. Increased number of shotty subcentimeter nodes within the neck bilaterally, left slightly worse than right. No other pathologically enlarged lymph nodes identified within the neck. Vascular: Normal intravascular enhancement seen throughout the neck. Right IJ approach central venous catheter in place. Soft  tissue swelling with stranding and scattered foci of soft tissue emphysema within the right neck likely related to central line placement. Limited intracranial: Unremarkable Visualized orbits: Partially visualized inferior globes and orbits unremarkable. Mastoids and visualized paranasal sinuses: Mucosal thickening noted within the visualized maxillary and sphenoid sinuses. Visualized paranasal sinuses  are otherwise clear. Visualize mastoids and middle ear cavities are clear. Skeleton: No acute osseous abnormality. No discrete lytic or blastic osseous lesions. Mild cervical spondylolysis present at C5-6. Other: None. CT CHEST FINDINGS Cardiovascular: Normal intravascular enhancement seen throughout the intra-abdominal aorta without aneurysm or other acute abnormality. Minimal atheromatous plaque within the aortic arch. Visualized great vessels within normal limits. Heart size normal. Small moderate pericardial effusion, simple fluid density. Mediastinum/Nodes: Enlarged nodes within the upper mediastinum measure up to 15 mm in short axis (series 3, image 11). No appreciable supraclavicular adenopathy. No other pathologically enlarged mediastinal lymph nodes. Mild soft tissue density within the right hilum without frank adenopathy. Mildly enlarged 11 mm left hilar node (series 3, image 28). Enlarged axillary adenopathy seen bilaterally, with the largest node on the left measuring 2.1 cm in short axis (series 3, image 16). Largest node on the right measures 1.7 cm in short axis (series 3, image 8). Scattered soft tissue stranding with emphysema and fluid density overlying the left axilla likely related to recent lymph node biopsy, partially visualized. Esophagus within normal limits. Lungs/Pleura: Tracheobronchial tree intact and patent. Moderate layering bilateral pleural effusions with associated atelectasis. No other airspace consolidation. No pulmonary edema. No pneumothorax. 4 mm subpleural ground-glass nodule  present at the anterior right upper lobe (series 4, image 57), indeterminate. No other worrisome pulmonary nodule or mass. Musculoskeletal: No acute osseous finding. No discrete lytic or blastic osseous lesions. CT ABDOMEN PELVIS FINDINGS Hepatobiliary: Liver demonstrates a normal contrast enhanced appearance. Gallbladder within normal limits. No biliary dilatation. Pancreas: Pancreas within normal limits. Spleen: Spleen enlarged measuring 14.3 cm in craniocaudad dimension. Few subtle hypodensities noted within the spleen, largest of which measures approximately 12 mm (series 3, image 57), indeterminate. Adrenals/Urinary Tract: Adrenal glands are normal. Kidneys equal in size with symmetric enhancement. No nephrolithiasis, hydronephrosis, or focal enhancing renal mass. No hydroureter. Bladder within normal limits. Stomach/Bowel: Stomach within normal limits. No evidence for bowel obstruction. Normal appendix. No acute inflammatory changes seen about the bowels. Vascular/Lymphatic: Normal intravascular enhancement seen throughout the intra-abdominal aorta. Mild aorto bi-iliac atherosclerotic disease. No aneurysm. Mesenteric vessels patent proximally. Enlarged 15 mm aortocaval lymph node (series 3, image 7). Additional multifocal shotty subcentimeter aortocaval and periaortic lymph nodes noted. No other pathologically enlarged intra-abdominal lymph nodes identified. Mildly prominent 12 mm left inguinal lymph nodes noted. No other adenopathy within the pelvis. Reproductive: Uterus within normal limits.  Ovaries normal. Other: Moderate volume free fluid within the pelvis, measuring simple fluid density. No free intraperitoneal air. Musculoskeletal: Scattered anasarca noted within the external soft tissues. No acute osseous finding. No discrete lytic or blastic osseous lesions. IMPRESSION: CT NECK IMPRESSION 1. Increased number of shoddy subcentimeter lymph nodes throughout the neck bilaterally, left slightly worse than  right. No pathologically enlarged lymph nodes identified. 2. Mild diffuse thyromegaly without discrete thyroid nodule or mass. 3. Mild soft tissue stranding with emphysema within the right lateral neck related to recent right-sided Port-A-Cath placement. CT CHEST IMPRESSION 1. Enlarged bilateral axillary and upper mediastinal adenopathy as above, likely related to provided history of lymphoma. Additional borderline enlarged left hilar node may be related to lymphoma or possibly reactive in nature. Attention at follow-up recommended. 2. Moderate layering bilateral pleural effusions with associated atelectasis. 3. Small to moderate pericardial effusion. 4. 4 mm right upper lobe ground-glass nodule, indeterminate. Attention at follow-up recommended. CT ABDOMEN AND PELVIS IMPRESSION 1. Enlarged 15 mm aortocaval lymph node with mildly enlarged 12 mm left inguinal lymph nodes. Additional increased  number of shotty subcentimeter retroperitoneal lymph nodes. Findings most likely related to history of lymphoma. 2. Splenomegaly. 3. Moderate volume free fluid within the pelvis, of uncertain etiology, but could be physiologic and/or related overall volume status. 4. Mild diffuse anasarca. Electronically Signed   By: Jeannine Boga M.D.   On: 11/18/2018 19:56   Ct Chest W Contrast  Result Date: 11/18/2018 CLINICAL DATA:  Initial evaluation for Hodgkin's lymphoma, initial workup. EXAM: CT NECK WITH CONTRAST CT CHEST, ABDOMEN, AND PELVIS WITH CONTRAST TECHNIQUE: Multidetector CT imaging of the chest, abdomen and pelvis was performed following the standard protocol during bolus administration of intravenous contrast. CONTRAST:  115m OMNIPAQUE IOHEXOL 300 MG/ML SOLN, 366mOMNIPAQUE IOHEXOL 300 MG/ML SOLN COMPARISON:  None. FINDINGS: CT NECK FINDINGS: Pharynx and larynx: Oral cavity within normal limits without mass lesion or loculated collection. Patient is largely edentulous. Calcified tonsilliths noted within the right  palatine tonsil. Tonsils themselves symmetric and within normal limits bilaterally. Parapharyngeal fat maintained. Nasopharynx normal. No retropharyngeal collection. Epiglottis normal. Vallecula clear. Remainder of the hypopharynx and supraglottic larynx normal. Glottis within normal limits. Subglottic airway clear. Salivary glands: Parotid and submandibular glands within normal limits. Thyroid: Thyroid diffusely enlarged without discrete nodule or mass. Lymph nodes: Mildly prominent level II lymph nodes measure up to 9 mm in short axis bilaterally. Left level III nodes measure up to 9 mm as well. Increased number of shotty subcentimeter nodes within the neck bilaterally, left slightly worse than right. No other pathologically enlarged lymph nodes identified within the neck. Vascular: Normal intravascular enhancement seen throughout the neck. Right IJ approach central venous catheter in place. Soft tissue swelling with stranding and scattered foci of soft tissue emphysema within the right neck likely related to central line placement. Limited intracranial: Unremarkable Visualized orbits: Partially visualized inferior globes and orbits unremarkable. Mastoids and visualized paranasal sinuses: Mucosal thickening noted within the visualized maxillary and sphenoid sinuses. Visualized paranasal sinuses are otherwise clear. Visualize mastoids and middle ear cavities are clear. Skeleton: No acute osseous abnormality. No discrete lytic or blastic osseous lesions. Mild cervical spondylolysis present at C5-6. Other: None. CT CHEST FINDINGS Cardiovascular: Normal intravascular enhancement seen throughout the intra-abdominal aorta without aneurysm or other acute abnormality. Minimal atheromatous plaque within the aortic arch. Visualized great vessels within normal limits. Heart size normal. Small moderate pericardial effusion, simple fluid density. Mediastinum/Nodes: Enlarged nodes within the upper mediastinum measure up to 15  mm in short axis (series 3, image 11). No appreciable supraclavicular adenopathy. No other pathologically enlarged mediastinal lymph nodes. Mild soft tissue density within the right hilum without frank adenopathy. Mildly enlarged 11 mm left hilar node (series 3, image 28). Enlarged axillary adenopathy seen bilaterally, with the largest node on the left measuring 2.1 cm in short axis (series 3, image 16). Largest node on the right measures 1.7 cm in short axis (series 3, image 8). Scattered soft tissue stranding with emphysema and fluid density overlying the left axilla likely related to recent lymph node biopsy, partially visualized. Esophagus within normal limits. Lungs/Pleura: Tracheobronchial tree intact and patent. Moderate layering bilateral pleural effusions with associated atelectasis. No other airspace consolidation. No pulmonary edema. No pneumothorax. 4 mm subpleural ground-glass nodule present at the anterior right upper lobe (series 4, image 57), indeterminate. No other worrisome pulmonary nodule or mass. Musculoskeletal: No acute osseous finding. No discrete lytic or blastic osseous lesions. CT ABDOMEN PELVIS FINDINGS Hepatobiliary: Liver demonstrates a normal contrast enhanced appearance. Gallbladder within normal limits. No biliary dilatation. Pancreas: Pancreas within  normal limits. Spleen: Spleen enlarged measuring 14.3 cm in craniocaudad dimension. Few subtle hypodensities noted within the spleen, largest of which measures approximately 12 mm (series 3, image 57), indeterminate. Adrenals/Urinary Tract: Adrenal glands are normal. Kidneys equal in size with symmetric enhancement. No nephrolithiasis, hydronephrosis, or focal enhancing renal mass. No hydroureter. Bladder within normal limits. Stomach/Bowel: Stomach within normal limits. No evidence for bowel obstruction. Normal appendix. No acute inflammatory changes seen about the bowels. Vascular/Lymphatic: Normal intravascular enhancement seen  throughout the intra-abdominal aorta. Mild aorto bi-iliac atherosclerotic disease. No aneurysm. Mesenteric vessels patent proximally. Enlarged 15 mm aortocaval lymph node (series 3, image 7). Additional multifocal shotty subcentimeter aortocaval and periaortic lymph nodes noted. No other pathologically enlarged intra-abdominal lymph nodes identified. Mildly prominent 12 mm left inguinal lymph nodes noted. No other adenopathy within the pelvis. Reproductive: Uterus within normal limits.  Ovaries normal. Other: Moderate volume free fluid within the pelvis, measuring simple fluid density. No free intraperitoneal air. Musculoskeletal: Scattered anasarca noted within the external soft tissues. No acute osseous finding. No discrete lytic or blastic osseous lesions. IMPRESSION: CT NECK IMPRESSION 1. Increased number of shoddy subcentimeter lymph nodes throughout the neck bilaterally, left slightly worse than right. No pathologically enlarged lymph nodes identified. 2. Mild diffuse thyromegaly without discrete thyroid nodule or mass. 3. Mild soft tissue stranding with emphysema within the right lateral neck related to recent right-sided Port-A-Cath placement. CT CHEST IMPRESSION 1. Enlarged bilateral axillary and upper mediastinal adenopathy as above, likely related to provided history of lymphoma. Additional borderline enlarged left hilar node may be related to lymphoma or possibly reactive in nature. Attention at follow-up recommended. 2. Moderate layering bilateral pleural effusions with associated atelectasis. 3. Small to moderate pericardial effusion. 4. 4 mm right upper lobe ground-glass nodule, indeterminate. Attention at follow-up recommended. CT ABDOMEN AND PELVIS IMPRESSION 1. Enlarged 15 mm aortocaval lymph node with mildly enlarged 12 mm left inguinal lymph nodes. Additional increased number of shotty subcentimeter retroperitoneal lymph nodes. Findings most likely related to history of lymphoma. 2. Splenomegaly.  3. Moderate volume free fluid within the pelvis, of uncertain etiology, but could be physiologic and/or related overall volume status. 4. Mild diffuse anasarca. Electronically Signed   By: Jeannine Boga M.D.   On: 11/18/2018 19:56   Ct Abdomen Pelvis W Contrast  Result Date: 11/18/2018 CLINICAL DATA:  Initial evaluation for Hodgkin's lymphoma, initial workup. EXAM: CT NECK WITH CONTRAST CT CHEST, ABDOMEN, AND PELVIS WITH CONTRAST TECHNIQUE: Multidetector CT imaging of the chest, abdomen and pelvis was performed following the standard protocol during bolus administration of intravenous contrast. CONTRAST:  158m OMNIPAQUE IOHEXOL 300 MG/ML SOLN, 39mOMNIPAQUE IOHEXOL 300 MG/ML SOLN COMPARISON:  None. FINDINGS: CT NECK FINDINGS: Pharynx and larynx: Oral cavity within normal limits without mass lesion or loculated collection. Patient is largely edentulous. Calcified tonsilliths noted within the right palatine tonsil. Tonsils themselves symmetric and within normal limits bilaterally. Parapharyngeal fat maintained. Nasopharynx normal. No retropharyngeal collection. Epiglottis normal. Vallecula clear. Remainder of the hypopharynx and supraglottic larynx normal. Glottis within normal limits. Subglottic airway clear. Salivary glands: Parotid and submandibular glands within normal limits. Thyroid: Thyroid diffusely enlarged without discrete nodule or mass. Lymph nodes: Mildly prominent level II lymph nodes measure up to 9 mm in short axis bilaterally. Left level III nodes measure up to 9 mm as well. Increased number of shotty subcentimeter nodes within the neck bilaterally, left slightly worse than right. No other pathologically enlarged lymph nodes identified within the neck. Vascular: Normal intravascular enhancement seen  throughout the neck. Right IJ approach central venous catheter in place. Soft tissue swelling with stranding and scattered foci of soft tissue emphysema within the right neck likely related to  central line placement. Limited intracranial: Unremarkable Visualized orbits: Partially visualized inferior globes and orbits unremarkable. Mastoids and visualized paranasal sinuses: Mucosal thickening noted within the visualized maxillary and sphenoid sinuses. Visualized paranasal sinuses are otherwise clear. Visualize mastoids and middle ear cavities are clear. Skeleton: No acute osseous abnormality. No discrete lytic or blastic osseous lesions. Mild cervical spondylolysis present at C5-6. Other: None. CT CHEST FINDINGS Cardiovascular: Normal intravascular enhancement seen throughout the intra-abdominal aorta without aneurysm or other acute abnormality. Minimal atheromatous plaque within the aortic arch. Visualized great vessels within normal limits. Heart size normal. Small moderate pericardial effusion, simple fluid density. Mediastinum/Nodes: Enlarged nodes within the upper mediastinum measure up to 15 mm in short axis (series 3, image 11). No appreciable supraclavicular adenopathy. No other pathologically enlarged mediastinal lymph nodes. Mild soft tissue density within the right hilum without frank adenopathy. Mildly enlarged 11 mm left hilar node (series 3, image 28). Enlarged axillary adenopathy seen bilaterally, with the largest node on the left measuring 2.1 cm in short axis (series 3, image 16). Largest node on the right measures 1.7 cm in short axis (series 3, image 8). Scattered soft tissue stranding with emphysema and fluid density overlying the left axilla likely related to recent lymph node biopsy, partially visualized. Esophagus within normal limits. Lungs/Pleura: Tracheobronchial tree intact and patent. Moderate layering bilateral pleural effusions with associated atelectasis. No other airspace consolidation. No pulmonary edema. No pneumothorax. 4 mm subpleural ground-glass nodule present at the anterior right upper lobe (series 4, image 57), indeterminate. No other worrisome pulmonary nodule or  mass. Musculoskeletal: No acute osseous finding. No discrete lytic or blastic osseous lesions. CT ABDOMEN PELVIS FINDINGS Hepatobiliary: Liver demonstrates a normal contrast enhanced appearance. Gallbladder within normal limits. No biliary dilatation. Pancreas: Pancreas within normal limits. Spleen: Spleen enlarged measuring 14.3 cm in craniocaudad dimension. Few subtle hypodensities noted within the spleen, largest of which measures approximately 12 mm (series 3, image 57), indeterminate. Adrenals/Urinary Tract: Adrenal glands are normal. Kidneys equal in size with symmetric enhancement. No nephrolithiasis, hydronephrosis, or focal enhancing renal mass. No hydroureter. Bladder within normal limits. Stomach/Bowel: Stomach within normal limits. No evidence for bowel obstruction. Normal appendix. No acute inflammatory changes seen about the bowels. Vascular/Lymphatic: Normal intravascular enhancement seen throughout the intra-abdominal aorta. Mild aorto bi-iliac atherosclerotic disease. No aneurysm. Mesenteric vessels patent proximally. Enlarged 15 mm aortocaval lymph node (series 3, image 7). Additional multifocal shotty subcentimeter aortocaval and periaortic lymph nodes noted. No other pathologically enlarged intra-abdominal lymph nodes identified. Mildly prominent 12 mm left inguinal lymph nodes noted. No other adenopathy within the pelvis. Reproductive: Uterus within normal limits.  Ovaries normal. Other: Moderate volume free fluid within the pelvis, measuring simple fluid density. No free intraperitoneal air. Musculoskeletal: Scattered anasarca noted within the external soft tissues. No acute osseous finding. No discrete lytic or blastic osseous lesions. IMPRESSION: CT NECK IMPRESSION 1. Increased number of shoddy subcentimeter lymph nodes throughout the neck bilaterally, left slightly worse than right. No pathologically enlarged lymph nodes identified. 2. Mild diffuse thyromegaly without discrete thyroid nodule  or mass. 3. Mild soft tissue stranding with emphysema within the right lateral neck related to recent right-sided Port-A-Cath placement. CT CHEST IMPRESSION 1. Enlarged bilateral axillary and upper mediastinal adenopathy as above, likely related to provided history of lymphoma. Additional borderline enlarged left hilar node may  be related to lymphoma or possibly reactive in nature. Attention at follow-up recommended. 2. Moderate layering bilateral pleural effusions with associated atelectasis. 3. Small to moderate pericardial effusion. 4. 4 mm right upper lobe ground-glass nodule, indeterminate. Attention at follow-up recommended. CT ABDOMEN AND PELVIS IMPRESSION 1. Enlarged 15 mm aortocaval lymph node with mildly enlarged 12 mm left inguinal lymph nodes. Additional increased number of shotty subcentimeter retroperitoneal lymph nodes. Findings most likely related to history of lymphoma. 2. Splenomegaly. 3. Moderate volume free fluid within the pelvis, of uncertain etiology, but could be physiologic and/or related overall volume status. 4. Mild diffuse anasarca. Electronically Signed   By: Jeannine Boga M.D.   On: 11/18/2018 19:56   Ct Biopsy  Result Date: 11/15/2018 INDICATION: Pancytopenia, concern for lymphoproliferative process EXAM: CT GUIDED RIGHT ILIAC BONE MARROW ASPIRATION AND CORE BIOPSY Date:  11/15/2018 11/15/2018 10:13 am Radiologist:  M. Daryll Brod, MD Guidance:  CT FLUOROSCOPY TIME:  Fluoroscopy Time: None. MEDICATIONS: 1% lidocaine local ANESTHESIA/SEDATION: 2.0 mg IV Versed; 50 mcg IV Fentanyl Moderate Sedation Time:  10 minutes The patient was continuously monitored during the procedure by the interventional radiology nurse under my direct supervision. CONTRAST:  None. COMPLICATIONS: None PROCEDURE: Informed consent was obtained from the patient following explanation of the procedure, risks, benefits and alternatives. The patient understands, agrees and consents for the procedure. All  questions were addressed. A time out was performed. The patient was positioned prone and non-contrast localization CT was performed of the pelvis to demonstrate the iliac marrow spaces. Maximal barrier sterile technique utilized including caps, mask, sterile gowns, sterile gloves, large sterile drape, hand hygiene, and Betadine prep. Under sterile conditions and local anesthesia, an 11 gauge coaxial bone biopsy needle was advanced into the right iliac marrow space. Needle position was confirmed with CT imaging. Initially, bone marrow aspiration was performed. Next, the 11 gauge outer cannula was utilized to obtain a right iliac bone marrow core biopsy. Needle was removed. Hemostasis was obtained with compression. The patient tolerated the procedure well. Samples were prepared with the cytotechnologist. No immediate complications. IMPRESSION: CT guided right iliac bone marrow aspiration and core biopsy. Electronically Signed   By: Jerilynn Mages.  Shick M.D.   On: 11/15/2018 11:25   Dg Chest Port 1 View  Result Date: 11/11/2018 CLINICAL DATA:  Fever EXAM: PORTABLE CHEST 1 VIEW COMPARISON:  None. FINDINGS: Mild cardiomegaly. No focal consolidation or effusion. No pneumothorax. IMPRESSION: No active disease. Electronically Signed   By: Donavan Foil M.D.   On: 11/11/2018 21:59   Ct Bone Marrow Biopsy & Aspiration  Result Date: 11/15/2018 INDICATION: Pancytopenia, concern for lymphoproliferative process EXAM: CT GUIDED RIGHT ILIAC BONE MARROW ASPIRATION AND CORE BIOPSY Date:  11/15/2018 11/15/2018 10:13 am Radiologist:  M. Daryll Brod, MD Guidance:  CT FLUOROSCOPY TIME:  Fluoroscopy Time: None. MEDICATIONS: 1% lidocaine local ANESTHESIA/SEDATION: 2.0 mg IV Versed; 50 mcg IV Fentanyl Moderate Sedation Time:  10 minutes The patient was continuously monitored during the procedure by the interventional radiology nurse under my direct supervision. CONTRAST:  None. COMPLICATIONS: None PROCEDURE: Informed consent was obtained from  the patient following explanation of the procedure, risks, benefits and alternatives. The patient understands, agrees and consents for the procedure. All questions were addressed. A time out was performed. The patient was positioned prone and non-contrast localization CT was performed of the pelvis to demonstrate the iliac marrow spaces. Maximal barrier sterile technique utilized including caps, mask, sterile gowns, sterile gloves, large sterile drape, hand hygiene, and Betadine prep. Under  sterile conditions and local anesthesia, an 11 gauge coaxial bone biopsy needle was advanced into the right iliac marrow space. Needle position was confirmed with CT imaging. Initially, bone marrow aspiration was performed. Next, the 11 gauge outer cannula was utilized to obtain a right iliac bone marrow core biopsy. Needle was removed. Hemostasis was obtained with compression. The patient tolerated the procedure well. Samples were prepared with the cytotechnologist. No immediate complications. IMPRESSION: CT guided right iliac bone marrow aspiration and core biopsy. Electronically Signed   By: Jerilynn Mages.  Shick M.D.   On: 11/15/2018 11:25   Ir Imaging Guided Port Insertion  Result Date: 11/18/2018 INDICATION: 53 year old female with history lymphoma EXAM: IMPLANTED PORT A CATH PLACEMENT WITH ULTRASOUND AND FLUOROSCOPIC GUIDANCE MEDICATIONS: 2.0 g Ancef; The antibiotic was administered within an appropriate time interval prior to skin puncture. ANESTHESIA/SEDATION: Moderate (conscious) sedation was employed during this procedure. A total of Versed 2.0 mg and Fentanyl 100 mcg was administered intravenously. Moderate Sedation Time: 18 minutes. The patient's level of consciousness and vital signs were monitored continuously by radiology nursing throughout the procedure under my direct supervision. FLUOROSCOPY TIME:  0 minutes, 12 seconds (1.0 mGy) COMPLICATIONS: None PROCEDURE: The procedure, risks, benefits, and alternatives were  explained to the patient. Questions regarding the procedure were encouraged and answered. The patient understands and consents to the procedure. Ultrasound survey was performed with images stored and sent to PACs. The right neck and chest was prepped with chlorhexidine, and draped in the usual sterile fashion using maximum barrier technique (cap and mask, sterile gown, sterile gloves, large sterile sheet, hand hygiene and cutaneous antiseptic). Antibiotic prophylaxis was provided with 2.0g Ancef administered IV one hour prior to skin incision. Local anesthesia was attained by infiltration with 1% lidocaine without epinephrine. Ultrasound demonstrated patency of the right internal jugular vein, and this was documented with an image. Under real-time ultrasound guidance, this vein was accessed with a 21 gauge micropuncture needle and image documentation was performed. A small dermatotomy was made at the access site with an 11 scalpel. A 0.018" wire was advanced into the SVC and used to estimate the length of the internal catheter. The access needle exchanged for a 66F micropuncture vascular sheath. The 0.018" wire was then removed and a 0.035" wire advanced into the IVC. An appropriate location for the subcutaneous reservoir was selected below the clavicle and an incision was made through the skin and underlying soft tissues. The subcutaneous tissues were then dissected using a combination of blunt and sharp surgical technique and a pocket was formed. A single lumen power injectable portacatheter was then tunneled through the subcutaneous tissues from the pocket to the dermatotomy and the port reservoir placed within the subcutaneous pocket. The venous access site was then serially dilated and a peel away vascular sheath placed over the wire. The wire was removed and the port catheter advanced into position under fluoroscopic guidance. The catheter tip is positioned in the cavoatrial junction. This was documented with a  spot image. The portacatheter was then tested and found to flush and aspirate well. The port was flushed with saline followed by 100 units/mL heparinized saline. The pocket was then closed in two layers using first subdermal inverted interrupted absorbable sutures followed by a running subcuticular suture. The epidermis was then sealed with Dermabond. The dermatotomy at the venous access site was also seal with Dermabond. Patient tolerated the procedure well and remained hemodynamically stable throughout. No complications encountered and no significant blood loss encountered IMPRESSION: Status post  right IJ port catheter placement. Catheter ready for use. Signed, Dulcy Fanny. Dellia Nims, RPVI [facility] Electronically Signed   By: Corrie Mckusick D.O.   On: 11/18/2018 11:23    Lab Data:  CBC: Recent Labs  Lab 11/15/18 0557 11/16/18 0605 11/18/18 0354 11/19/18 0540 11/20/18 0500 11/21/18 0619  WBC 3.7* 3.8* 4.0 4.5 8.7 18.2*  NEUTROABS 2.9  --   --   --   --   --   HGB 7.7* 7.9* 7.2* 6.9* 9.3* 8.4*  HCT 24.3* 25.1* 23.7* 22.1* 28.8* 26.3*  MCV 86.5 86.9 87.8 86.7 84.0 85.9  PLT 87* 101* 109* 93* 80* 68*   Basic Metabolic Panel: Recent Labs  Lab 11/15/18 0557  11/16/18 0324 11/18/18 0354 11/19/18 0540 11/20/18 0500 11/21/18 0619  NA 134*   < > 134* 137 136 137 138  K 2.6*   < > 3.9 2.9* 3.3* 4.4 3.5  CL 97*   < > 99 105 105 107 108  CO2 28   < > 23 22 21* 19* 18*  GLUCOSE 112*   < > 85 79 103* 120* 99  BUN 12   < > _0 CREATININE 0.77   < > 0.73 0.68 0.63 0.59 0.86  CALCIUM 7.7*   < > 7.5* 7.3* 7.2* 7.6* 7.3*  MG 2.0  --  2.0  --   --   --   --   PHOS 1.7*  --  2.4*  --   --   --   --    < > = values in this interval not displayed.   GFR: Estimated Creatinine Clearance: 83.2 mL/min (by C-G formula based on SCr of 0.86 mg/dL). Liver Function Tests: Recent Labs  Lab 11/15/18 0557 11/19/18 0805  AST 43* 30    ALT 24 15  ALKPHOS 120 154*  BILITOT 1.5* 1.2  PROT 5.4* 5.1*  ALBUMIN 2.0* 1.7*   No results for input(s): LIPASE, AMYLASE in the last 168 hours. No results for input(s): AMMONIA in the last 168 hours. Coagulation Profile: Recent Labs  Lab 11/18/18 0354  INR 1.65   Cardiac Enzymes: No results for input(s): CKTOTAL, CKMB, CKMBINDEX, TROPONINI in the last 168 hours. BNP (last 3 results) No results for input(s): PROBNP in the last 8760 hours. HbA1C: No results for input(s): HGBA1C in the last 72 hours. CBG: Recent Labs  Lab 11/20/18 2153  GLUCAP 92   Lipid Profile: No results for input(s): CHOL, HDL, LDLCALC, TRIG, CHOLHDL, LDLDIRECT in the last 72 hours. Thyroid Function Tests: No results for input(s): TSH, T4TOTAL, FREET4, T3FREE, THYROIDAB in the last 72 hours. Anemia Panel: No results for input(s): VITAMINB12, FOLATE, FERRITIN, TIBC, IRON, RETICCTPCT in the last 72 hours. Urine analysis:    Component Value Date/Time   COLORURINE YELLOW 11/14/2018 1619   APPEARANCEUR HAZY (A) 11/14/2018 1619   LABSPEC 1.016 11/14/2018 1619   PHURINE 5.0 11/14/2018 1619   GLUCOSEU NEGATIVE 11/14/2018 1619   HGBUR SMALL (A) 11/14/2018 1619   BILIRUBINUR NEGATIVE 11/14/2018 1619   KETONESUR NEGATIVE 11/14/2018 1619   PROTEINUR 30 (A) 11/14/2018 1619   NITRITE NEGATIVE 11/14/2018 1619   LEUKOCYTESUR NEGATIVE 11/14/2018 1619     Reyaan Thoma M.D. [facility] 11/21/2018, 1:38 PM  Pager: [phone] Between 7am to 7pm - call Pager - [phone]  After 7pm go to [email or web address] - password [email or web address]  Call night coverage person covering after 7pm

## 2018-11-21 NOTE — Progress Notes (Signed)
PT Cancellation Note  Patient Details Name: Margean Korell MRN: [id number] DOB: 1966-01-24   Cancelled Treatment:    Reason Eval/Treat Not Completed: Fatigue/lethargy limiting ability to participate;Medical issues which prohibited therapy(Upon first arrival to pt room at 1125, pt shivering and tachypic. Given warm blankets. Have checked on pt x2 since and is sleeping with continued tachypnea. PT to check back as schedule allows.)  Julien Girt, PT Acute Rehabilitation Services Pager [phone]  Office [phone]   Roxine Caddy D [name] 11/21/2018, 1:06 PM

## 2018-11-22 LAB — BASIC METABOLIC PANEL
Anion gap: 9 (ref 5–15)
BUN: 18 mg/dL (ref 6–20)
CHLORIDE: 109 mmol/L (ref 98–111)
CO2: 20 mmol/L — ABNORMAL LOW (ref 22–32)
Calcium: 7.1 mg/dL — ABNORMAL LOW (ref 8.9–10.3)
Creatinine, Ser: 0.69 mg/dL (ref 0.44–1.00)
GFR calc Af Amer: >60 mL/min (ref >60)
GFR calc non Af Amer: >60 mL/min (ref >60)
Glucose, Bld: 103 mg/dL — ABNORMAL HIGH (ref 70–99)
Potassium: 3.1 mmol/L — ABNORMAL LOW (ref 3.5–5.1)
Sodium: 138 mmol/L (ref 135–145)

## 2018-11-22 LAB — CBC
HCT: 20.1 % — ABNORMAL LOW (ref 36.0–46.0)
HEMOGLOBIN: 6.3 g/dL — AB (ref 12.0–15.0)
MCH: 27.4 pg (ref 26.0–34.0)
MCHC: 31.3 g/dL (ref 30.0–36.0)
MCV: 87.4 fL (ref 80.0–100.0)
Platelets: 32 10*3/uL — ABNORMAL LOW (ref 150–400)
RBC: 2.3 MIL/uL — ABNORMAL LOW (ref 3.87–5.11)
RDW: 19.5 % — ABNORMAL HIGH (ref 11.5–15.5)
WBC: 5.5 10*3/uL (ref 4.0–10.5)
nRBC: 0 % (ref 0.0–0.2)

## 2018-11-22 LAB — PREPARE RBC (CROSSMATCH)

## 2018-11-22 MED ORDER — SODIUM CHLORIDE 0.9% IV SOLUTION
Freq: Once | INTRAVENOUS | Status: AC
  Administered 2018-11-22: 12:00:00 via INTRAVENOUS

## 2018-11-22 MED ORDER — FUROSEMIDE 10 MG/ML IJ SOLN
20.0000 mg | Freq: Once | INTRAMUSCULAR | Status: AC
  Administered 2018-11-22: 20 mg via INTRAVENOUS
  Filled 2018-11-22: qty 2

## 2018-11-22 MED ORDER — IBUPROFEN 200 MG PO TABS
600.0000 mg | ORAL_TABLET | Freq: Once | ORAL | Status: AC
  Administered 2018-11-22: 600 mg via ORAL
  Filled 2018-11-22: qty 3

## 2018-11-22 MED ORDER — FUROSEMIDE 40 MG PO TABS
40.0000 mg | ORAL_TABLET | Freq: Every day | ORAL | Status: DC
  Administered 2018-11-22 – 2018-11-23 (×2): 40 mg via ORAL
  Filled 2018-11-22 (×2): qty 1

## 2018-11-22 NOTE — Progress Notes (Signed)
Patient went on a Mews score of 7 with vital signs Tem. 103, B/P 131/69, RR 26, 02 SAT 97 on 2L oxygen. Tylenol and oxycodone given at [place] NP was paged see order summary. Patient appeared lethargic, denies any discomfort at this time. Will continue to monitor closely.

## 2018-11-22 NOTE — Progress Notes (Signed)
PT Cancellation Note  Patient Details Name: Cindy Ward MRN: [id number] DOB: 07/27/1966   Cancelled Treatment:    Reason Eval/Treat Not Completed: Medical issues which prohibited therapy(pt receiving 2 units of blood due to Hgb of 6.3, will be done with transfusion ~1600. Will check back as schedule allows.)  Julien Girt, PT Acute Rehabilitation Services Pager [phone]  Office [phone]   Roxine Caddy D [name] 11/22/2018, 3:10 PM

## 2018-11-22 NOTE — Care Management (Signed)
Pt is without insurance. Medicaid application pending. Will need financial assistance with [place] in future to continue cancer treatment. May need Match letter at discharge for discharge medications. CM will continue to follow. Marney Doctor RN,BSN [phone]

## 2018-11-22 NOTE — Progress Notes (Addendum)
[place] INPATIENT PROGRESS NOTE  SUBJECTIVE: Cindy Ward 53 y.o. female with Stage IV Hodgkin lymphoma. Received Day 1 Cycle 1 AVD + Adcedris  on 11/19/2018. Had fever overnight to 103.0. Still having fatigue and night sweats.  No nausea or vomiting.  Has generalized swelling.  Appetite still decreased.  Still having a cough but no chest discomfort or shortness of breath.  No constipation or diarrhea.  ALLERGIES:  has No Known Allergies.  MEDICATIONS:  Current Facility-Administered Medications  Medication Dose Route Frequency Provider Last Rate Last Dose  . 0.9 %  sodium chloride infusion   Intravenous PRN Irene Pap N, DO 10 mL/hr at 11/19/18 1356 250 mL at 11/19/18 1356  . 0.9 %  sodium chloride infusion   Intravenous Once Tish Men, MD      . acetaminophen (TYLENOL) tablet 650 mg  650 mg Oral Q6H PRN Earnstine Regal, PA-C   650 mg at 11/22/18 0130   Or  . acetaminophen (TYLENOL) suppository 650 mg  650 mg Rectal Q6H PRN Earnstine Regal, PA-C   650 mg at 11/18/18 2147  . enoxaparin (LOVENOX) injection 40 mg  40 mg Subcutaneous Q24H Monia Sabal, PA-C   40 mg at 11/21/18 1028  . feeding supplement (BOOST / RESOURCE BREEZE) liquid 1 Container  1 Container Oral TID BM Kayleen Memos, DO   1 Container at 11/21/18 2044  . furosemide (LASIX) tablet 40 mg  40 mg Oral Daily Rai, Ripudeep K, MD      . HYDROmorphone (DILAUDID) injection 0.5-1 mg  0.5-1 mg Intravenous Q4H PRN Earnstine Regal, PA-C      . ibuprofen (ADVIL,MOTRIN) tablet 400 mg  400 mg Oral Q8H PRN Irene Pap N, DO   400 mg at 11/18/18 2334  . iohexol (OMNIPAQUE) 300 MG/ML solution 15 mL  15 mL Oral Once PRN Irene Pap N, DO   30 mL at 11/18/18 1740  . levothyroxine (SYNTHROID, LEVOTHROID) tablet 75 mcg  75 mcg Oral Q0600 Earnstine Regal, PA-C   75 mcg at 11/22/18 0602  . lip balm (CARMEX) ointment   Topical PRN Irene Pap N, DO      . MEDLINE mouth rinse  15 mL Mouth Rinse BID Earnstine Regal,  PA-C   15 mL at 11/21/18 2255  . multivitamin with minerals tablet 1 tablet  1 tablet Oral Daily Irene Pap N, DO   1 tablet at 11/21/18 1028  . ondansetron (ZOFRAN) tablet 4 mg  4 mg Oral Q6H PRN Earnstine Regal, PA-C       Or  . ondansetron [facility]) injection 4 mg  4 mg Intravenous Q6H PRN Earnstine Regal, PA-C   4 mg at 11/12/18 4536  . oxyCODONE (Oxy IR/ROXICODONE) immediate release tablet 5-10 mg  5-10 mg Oral Q4H PRN Earnstine Regal, PA-C   5 mg at 11/22/18 0130  . polyethylene glycol (MIRALAX / GLYCOLAX) packet 17 g  17 g Oral Daily PRN Earnstine Regal, PA-C      . protein supplement [facility] CHICKEN SOUP) powder 8 oz  8 oz Oral q12n4p Hall, Carole N, DO   8 oz at 11/21/18 1529  . scopolamine (TRANSDERM-SCOP) 1 MG/3DAYS 1.5 mg  1 patch Transdermal Q72H Earnstine Regal, PA-C   1.5 mg at 11/21/18 1528  . sodium chloride flush (NS) 0.9 % injection 10-40 mL  10-40 mL Intracatheter PRN Kayleen Memos, DO      . Tbo-Filgrastim [facility]) injection 480 mcg  480 mcg Subcutaneous q1800 Tish Men, MD  480 mcg at 11/21/18 1734    REVIEW OF SYSTEMS:   Review of Systems  Review of Systems  Constitutional: Positive for chills, fever and malaise/fatigue.  HENT: Negative.   Eyes: Negative.   Respiratory: Positive for cough. Negative for shortness of breath.   Cardiovascular: Positive for leg swelling. Negative for chest pain and palpitations.  Gastrointestinal: Negative.   Genitourinary: Negative.   Musculoskeletal: Negative.   Skin: Negative.   Neurological: Negative.   Endo/Heme/Allergies:       Bruises easily.  Psychiatric/Behavioral: Negative.       PHYSICAL EXAMINATION:  Vital signs in last 24 hours: Temp:  [98.2 F (36.8 C)-103 F (39.4 C)] 98.2 F (36.8 C) (02/28 0514) Pulse Rate:  [82-126] 92 (02/28 0514) Resp:  [17-36] 20 (02/28 0514) BP: (114-147)/(69-89) 124/77 (02/28 0514) SpO2:  [93 %-98 %] 96 % (02/28 0514) Weight:  [168 lb 1.6 oz (76.2 kg)] 168 lb 1.6 oz (76.2  kg) (02/28 0514) Weight change:  Last BM Date: 11/19/18  ECOG PERFORMANCE STATUS: 1 - Symptomatic but completely ambulatory  Intake/Output from previous day: 02/27 0701 - 02/28 0700 In: 400 [P.O.:400] Out: 300 [Urine:300] General: Alert, awake without distress. Head: Normocephalic atraumatic. Mouth: mucous membranes moist, pharynx normal without lesions Eyes: No scleral icterus.  Pupils are equal and round reactive to light. Resp: Right lung clear. Left lung with rales at the base.  Cardio: regular rate and rhythm, S1, S2 normal, no murmur, click, rub or gallop.  Trace bilateral lower extremity edema. GI: soft, non-tender; bowel sounds normal; no masses,  no organomegaly Musculoskeletal: No joint deformity or effusion. Neurological: No motor, sensory deficits.  Intact deep tendon reflexes. Skin: No rashes or lesions.  Bruising noted to her arms from previous lab draws and IV sticks.  Portacath without erythema  LABORATORY DATA: Lab Results  Component Value Date   WBC 5.5 11/22/2018   HGB 6.3 (LL) 11/22/2018   HCT 20.1 (L) 11/22/2018   MCV 87.4 11/22/2018   PLT 32 (L) 11/22/2018    CMP Latest Ref Rng & Units 11/22/2018 11/21/2018 11/20/2018  Glucose 70 - 99 mg/dL 103(H) 99 120(H)  BUN 6 - 20 mg/dL _0 Creatinine 0.44 - 1.00 mg/dL 0.69 0.86 0.59  Sodium 135 - 145 mmol/L 138 138 137  Potassium 3.5 - 5.1 mmol/L 3.1(L) 3.5 4.4  Chloride 98 - 111 mmol/L 109 108 107  CO2 22 - 32 mmol/L 20(L) 18(L) 19(L)  Calcium 8.9 - 10.3 mg/dL 7.1(L) 7.3(L) 7.6(L)  Total Protein 6.5 - 8.1 g/dL - - -  Total Bilirubin 0.3 - 1.2 mg/dL - - -  Alkaline Phos 38 - 126 U/L - - -  AST 15 - 41 U/L - - -  ALT 0 - 44 U/L - - -     RADIOGRAPHIC STUDIES:  Dg Chest 2 View  Result Date: 11/21/2018 CLINICAL DATA:  History of lymphoma.  Patient weak and flushed. EXAM: CHEST - 2 VIEW COMPARISON:  Chest CT, 11/18/2018.  Chest radiographs, 11/11/2018. FINDINGS: Mild enlargement of the cardiopericardial  silhouette. No mediastinal or hilar masses. No convincing adenopathy. Small bilateral pleural effusions. Mild dependent lower lobe atelectasis. Lungs otherwise clear. No pneumothorax. Right anterior chest wall, internal jugular, Port-A-Cath is stable from the prior chest CT. Skeletal structures are unremarkable. IMPRESSION: 1. Findings are similar to the recent prior chest CT. Enlargement of the cardiopericardial silhouette is consistent with the pericardial effusion noted on CT. There are small bilateral pleural effusions with associated lower  lobe atelectasis. No convincing pneumonia and no pulmonary edema. Electronically Signed   By: Lajean Manes M.D.   On: 11/21/2018 15:05   Ct Soft Tissue Neck W Contrast  Result Date: 11/18/2018 CLINICAL DATA:  Initial evaluation for Hodgkin's lymphoma, initial workup. EXAM: CT NECK WITH CONTRAST CT CHEST, ABDOMEN, AND PELVIS WITH CONTRAST TECHNIQUE: Multidetector CT imaging of the chest, abdomen and pelvis was performed following the standard protocol during bolus administration of intravenous contrast. CONTRAST:  163m OMNIPAQUE IOHEXOL 300 MG/ML SOLN, 342mOMNIPAQUE IOHEXOL 300 MG/ML SOLN COMPARISON:  None. FINDINGS: CT NECK FINDINGS: Pharynx and larynx: Oral cavity within normal limits without mass lesion or loculated collection. Patient is largely edentulous. Calcified tonsilliths noted within the right palatine tonsil. Tonsils themselves symmetric and within normal limits bilaterally. Parapharyngeal fat maintained. Nasopharynx normal. No retropharyngeal collection. Epiglottis normal. Vallecula clear. Remainder of the hypopharynx and supraglottic larynx normal. Glottis within normal limits. Subglottic airway clear. Salivary glands: Parotid and submandibular glands within normal limits. Thyroid: Thyroid diffusely enlarged without discrete nodule or mass. Lymph nodes: Mildly prominent level II lymph nodes measure up to 9 mm in short axis bilaterally. Left level III  nodes measure up to 9 mm as well. Increased number of shotty subcentimeter nodes within the neck bilaterally, left slightly worse than right. No other pathologically enlarged lymph nodes identified within the neck. Vascular: Normal intravascular enhancement seen throughout the neck. Right IJ approach central venous catheter in place. Soft tissue swelling with stranding and scattered foci of soft tissue emphysema within the right neck likely related to central line placement. Limited intracranial: Unremarkable Visualized orbits: Partially visualized inferior globes and orbits unremarkable. Mastoids and visualized paranasal sinuses: Mucosal thickening noted within the visualized maxillary and sphenoid sinuses. Visualized paranasal sinuses are otherwise clear. Visualize mastoids and middle ear cavities are clear. Skeleton: No acute osseous abnormality. No discrete lytic or blastic osseous lesions. Mild cervical spondylolysis present at C5-6. Other: None. CT CHEST FINDINGS Cardiovascular: Normal intravascular enhancement seen throughout the intra-abdominal aorta without aneurysm or other acute abnormality. Minimal atheromatous plaque within the aortic arch. Visualized great vessels within normal limits. Heart size normal. Small moderate pericardial effusion, simple fluid density. Mediastinum/Nodes: Enlarged nodes within the upper mediastinum measure up to 15 mm in short axis (series 3, image 11). No appreciable supraclavicular adenopathy. No other pathologically enlarged mediastinal lymph nodes. Mild soft tissue density within the right hilum without frank adenopathy. Mildly enlarged 11 mm left hilar node (series 3, image 28). Enlarged axillary adenopathy seen bilaterally, with the largest node on the left measuring 2.1 cm in short axis (series 3, image 16). Largest node on the right measures 1.7 cm in short axis (series 3, image 8). Scattered soft tissue stranding with emphysema and fluid density overlying the left  axilla likely related to recent lymph node biopsy, partially visualized. Esophagus within normal limits. Lungs/Pleura: Tracheobronchial tree intact and patent. Moderate layering bilateral pleural effusions with associated atelectasis. No other airspace consolidation. No pulmonary edema. No pneumothorax. 4 mm subpleural ground-glass nodule present at the anterior right upper lobe (series 4, image 57), indeterminate. No other worrisome pulmonary nodule or mass. Musculoskeletal: No acute osseous finding. No discrete lytic or blastic osseous lesions. CT ABDOMEN PELVIS FINDINGS Hepatobiliary: Liver demonstrates a normal contrast enhanced appearance. Gallbladder within normal limits. No biliary dilatation. Pancreas: Pancreas within normal limits. Spleen: Spleen enlarged measuring 14.3 cm in craniocaudad dimension. Few subtle hypodensities noted within the spleen, largest of which measures approximately 12 mm (series 3, image 57), indeterminate.  Adrenals/Urinary Tract: Adrenal glands are normal. Kidneys equal in size with symmetric enhancement. No nephrolithiasis, hydronephrosis, or focal enhancing renal mass. No hydroureter. Bladder within normal limits. Stomach/Bowel: Stomach within normal limits. No evidence for bowel obstruction. Normal appendix. No acute inflammatory changes seen about the bowels. Vascular/Lymphatic: Normal intravascular enhancement seen throughout the intra-abdominal aorta. Mild aorto bi-iliac atherosclerotic disease. No aneurysm. Mesenteric vessels patent proximally. Enlarged 15 mm aortocaval lymph node (series 3, image 7). Additional multifocal shotty subcentimeter aortocaval and periaortic lymph nodes noted. No other pathologically enlarged intra-abdominal lymph nodes identified. Mildly prominent 12 mm left inguinal lymph nodes noted. No other adenopathy within the pelvis. Reproductive: Uterus within normal limits.  Ovaries normal. Other: Moderate volume free fluid within the pelvis, measuring  simple fluid density. No free intraperitoneal air. Musculoskeletal: Scattered anasarca noted within the external soft tissues. No acute osseous finding. No discrete lytic or blastic osseous lesions. IMPRESSION: CT NECK IMPRESSION 1. Increased number of shoddy subcentimeter lymph nodes throughout the neck bilaterally, left slightly worse than right. No pathologically enlarged lymph nodes identified. 2. Mild diffuse thyromegaly without discrete thyroid nodule or mass. 3. Mild soft tissue stranding with emphysema within the right lateral neck related to recent right-sided Port-A-Cath placement. CT CHEST IMPRESSION 1. Enlarged bilateral axillary and upper mediastinal adenopathy as above, likely related to provided history of lymphoma. Additional borderline enlarged left hilar node may be related to lymphoma or possibly reactive in nature. Attention at follow-up recommended. 2. Moderate layering bilateral pleural effusions with associated atelectasis. 3. Small to moderate pericardial effusion. 4. 4 mm right upper lobe ground-glass nodule, indeterminate. Attention at follow-up recommended. CT ABDOMEN AND PELVIS IMPRESSION 1. Enlarged 15 mm aortocaval lymph node with mildly enlarged 12 mm left inguinal lymph nodes. Additional increased number of shotty subcentimeter retroperitoneal lymph nodes. Findings most likely related to history of lymphoma. 2. Splenomegaly. 3. Moderate volume free fluid within the pelvis, of uncertain etiology, but could be physiologic and/or related overall volume status. 4. Mild diffuse anasarca. Electronically Signed   By: Jeannine Boga M.D.   On: 11/18/2018 19:56   Ct Chest W Contrast  Result Date: 11/18/2018 CLINICAL DATA:  Initial evaluation for Hodgkin's lymphoma, initial workup. EXAM: CT NECK WITH CONTRAST CT CHEST, ABDOMEN, AND PELVIS WITH CONTRAST TECHNIQUE: Multidetector CT imaging of the chest, abdomen and pelvis was performed following the standard protocol during bolus  administration of intravenous contrast. CONTRAST:  149m OMNIPAQUE IOHEXOL 300 MG/ML SOLN, 316mOMNIPAQUE IOHEXOL 300 MG/ML SOLN COMPARISON:  None. FINDINGS: CT NECK FINDINGS: Pharynx and larynx: Oral cavity within normal limits without mass lesion or loculated collection. Patient is largely edentulous. Calcified tonsilliths noted within the right palatine tonsil. Tonsils themselves symmetric and within normal limits bilaterally. Parapharyngeal fat maintained. Nasopharynx normal. No retropharyngeal collection. Epiglottis normal. Vallecula clear. Remainder of the hypopharynx and supraglottic larynx normal. Glottis within normal limits. Subglottic airway clear. Salivary glands: Parotid and submandibular glands within normal limits. Thyroid: Thyroid diffusely enlarged without discrete nodule or mass. Lymph nodes: Mildly prominent level II lymph nodes measure up to 9 mm in short axis bilaterally. Left level III nodes measure up to 9 mm as well. Increased number of shotty subcentimeter nodes within the neck bilaterally, left slightly worse than right. No other pathologically enlarged lymph nodes identified within the neck. Vascular: Normal intravascular enhancement seen throughout the neck. Right IJ approach central venous catheter in place. Soft tissue swelling with stranding and scattered foci of soft tissue emphysema within the right neck likely related to central  line placement. Limited intracranial: Unremarkable Visualized orbits: Partially visualized inferior globes and orbits unremarkable. Mastoids and visualized paranasal sinuses: Mucosal thickening noted within the visualized maxillary and sphenoid sinuses. Visualized paranasal sinuses are otherwise clear. Visualize mastoids and middle ear cavities are clear. Skeleton: No acute osseous abnormality. No discrete lytic or blastic osseous lesions. Mild cervical spondylolysis present at C5-6. Other: None. CT CHEST FINDINGS Cardiovascular: Normal intravascular  enhancement seen throughout the intra-abdominal aorta without aneurysm or other acute abnormality. Minimal atheromatous plaque within the aortic arch. Visualized great vessels within normal limits. Heart size normal. Small moderate pericardial effusion, simple fluid density. Mediastinum/Nodes: Enlarged nodes within the upper mediastinum measure up to 15 mm in short axis (series 3, image 11). No appreciable supraclavicular adenopathy. No other pathologically enlarged mediastinal lymph nodes. Mild soft tissue density within the right hilum without frank adenopathy. Mildly enlarged 11 mm left hilar node (series 3, image 28). Enlarged axillary adenopathy seen bilaterally, with the largest node on the left measuring 2.1 cm in short axis (series 3, image 16). Largest node on the right measures 1.7 cm in short axis (series 3, image 8). Scattered soft tissue stranding with emphysema and fluid density overlying the left axilla likely related to recent lymph node biopsy, partially visualized. Esophagus within normal limits. Lungs/Pleura: Tracheobronchial tree intact and patent. Moderate layering bilateral pleural effusions with associated atelectasis. No other airspace consolidation. No pulmonary edema. No pneumothorax. 4 mm subpleural ground-glass nodule present at the anterior right upper lobe (series 4, image 57), indeterminate. No other worrisome pulmonary nodule or mass. Musculoskeletal: No acute osseous finding. No discrete lytic or blastic osseous lesions. CT ABDOMEN PELVIS FINDINGS Hepatobiliary: Liver demonstrates a normal contrast enhanced appearance. Gallbladder within normal limits. No biliary dilatation. Pancreas: Pancreas within normal limits. Spleen: Spleen enlarged measuring 14.3 cm in craniocaudad dimension. Few subtle hypodensities noted within the spleen, largest of which measures approximately 12 mm (series 3, image 57), indeterminate. Adrenals/Urinary Tract: Adrenal glands are normal. Kidneys equal in  size with symmetric enhancement. No nephrolithiasis, hydronephrosis, or focal enhancing renal mass. No hydroureter. Bladder within normal limits. Stomach/Bowel: Stomach within normal limits. No evidence for bowel obstruction. Normal appendix. No acute inflammatory changes seen about the bowels. Vascular/Lymphatic: Normal intravascular enhancement seen throughout the intra-abdominal aorta. Mild aorto bi-iliac atherosclerotic disease. No aneurysm. Mesenteric vessels patent proximally. Enlarged 15 mm aortocaval lymph node (series 3, image 7). Additional multifocal shotty subcentimeter aortocaval and periaortic lymph nodes noted. No other pathologically enlarged intra-abdominal lymph nodes identified. Mildly prominent 12 mm left inguinal lymph nodes noted. No other adenopathy within the pelvis. Reproductive: Uterus within normal limits.  Ovaries normal. Other: Moderate volume free fluid within the pelvis, measuring simple fluid density. No free intraperitoneal air. Musculoskeletal: Scattered anasarca noted within the external soft tissues. No acute osseous finding. No discrete lytic or blastic osseous lesions. IMPRESSION: CT NECK IMPRESSION 1. Increased number of shoddy subcentimeter lymph nodes throughout the neck bilaterally, left slightly worse than right. No pathologically enlarged lymph nodes identified. 2. Mild diffuse thyromegaly without discrete thyroid nodule or mass. 3. Mild soft tissue stranding with emphysema within the right lateral neck related to recent right-sided Port-A-Cath placement. CT CHEST IMPRESSION 1. Enlarged bilateral axillary and upper mediastinal adenopathy as above, likely related to provided history of lymphoma. Additional borderline enlarged left hilar node may be related to lymphoma or possibly reactive in nature. Attention at follow-up recommended. 2. Moderate layering bilateral pleural effusions with associated atelectasis. 3. Small to moderate pericardial effusion. 4. 4 mm right  upper  lobe ground-glass nodule, indeterminate. Attention at follow-up recommended. CT ABDOMEN AND PELVIS IMPRESSION 1. Enlarged 15 mm aortocaval lymph node with mildly enlarged 12 mm left inguinal lymph nodes. Additional increased number of shotty subcentimeter retroperitoneal lymph nodes. Findings most likely related to history of lymphoma. 2. Splenomegaly. 3. Moderate volume free fluid within the pelvis, of uncertain etiology, but could be physiologic and/or related overall volume status. 4. Mild diffuse anasarca. Electronically Signed   By: Jeannine Boga M.D.   On: 11/18/2018 19:56   Ct Abdomen Pelvis W Contrast  Result Date: 11/18/2018 CLINICAL DATA:  Initial evaluation for Hodgkin's lymphoma, initial workup. EXAM: CT NECK WITH CONTRAST CT CHEST, ABDOMEN, AND PELVIS WITH CONTRAST TECHNIQUE: Multidetector CT imaging of the chest, abdomen and pelvis was performed following the standard protocol during bolus administration of intravenous contrast. CONTRAST:  131m OMNIPAQUE IOHEXOL 300 MG/ML SOLN, 374mOMNIPAQUE IOHEXOL 300 MG/ML SOLN COMPARISON:  None. FINDINGS: CT NECK FINDINGS: Pharynx and larynx: Oral cavity within normal limits without mass lesion or loculated collection. Patient is largely edentulous. Calcified tonsilliths noted within the right palatine tonsil. Tonsils themselves symmetric and within normal limits bilaterally. Parapharyngeal fat maintained. Nasopharynx normal. No retropharyngeal collection. Epiglottis normal. Vallecula clear. Remainder of the hypopharynx and supraglottic larynx normal. Glottis within normal limits. Subglottic airway clear. Salivary glands: Parotid and submandibular glands within normal limits. Thyroid: Thyroid diffusely enlarged without discrete nodule or mass. Lymph nodes: Mildly prominent level II lymph nodes measure up to 9 mm in short axis bilaterally. Left level III nodes measure up to 9 mm as well. Increased number of shotty subcentimeter nodes within the neck  bilaterally, left slightly worse than right. No other pathologically enlarged lymph nodes identified within the neck. Vascular: Normal intravascular enhancement seen throughout the neck. Right IJ approach central venous catheter in place. Soft tissue swelling with stranding and scattered foci of soft tissue emphysema within the right neck likely related to central line placement. Limited intracranial: Unremarkable Visualized orbits: Partially visualized inferior globes and orbits unremarkable. Mastoids and visualized paranasal sinuses: Mucosal thickening noted within the visualized maxillary and sphenoid sinuses. Visualized paranasal sinuses are otherwise clear. Visualize mastoids and middle ear cavities are clear. Skeleton: No acute osseous abnormality. No discrete lytic or blastic osseous lesions. Mild cervical spondylolysis present at C5-6. Other: None. CT CHEST FINDINGS Cardiovascular: Normal intravascular enhancement seen throughout the intra-abdominal aorta without aneurysm or other acute abnormality. Minimal atheromatous plaque within the aortic arch. Visualized great vessels within normal limits. Heart size normal. Small moderate pericardial effusion, simple fluid density. Mediastinum/Nodes: Enlarged nodes within the upper mediastinum measure up to 15 mm in short axis (series 3, image 11). No appreciable supraclavicular adenopathy. No other pathologically enlarged mediastinal lymph nodes. Mild soft tissue density within the right hilum without frank adenopathy. Mildly enlarged 11 mm left hilar node (series 3, image 28). Enlarged axillary adenopathy seen bilaterally, with the largest node on the left measuring 2.1 cm in short axis (series 3, image 16). Largest node on the right measures 1.7 cm in short axis (series 3, image 8). Scattered soft tissue stranding with emphysema and fluid density overlying the left axilla likely related to recent lymph node biopsy, partially visualized. Esophagus within normal  limits. Lungs/Pleura: Tracheobronchial tree intact and patent. Moderate layering bilateral pleural effusions with associated atelectasis. No other airspace consolidation. No pulmonary edema. No pneumothorax. 4 mm subpleural ground-glass nodule present at the anterior right upper lobe (series 4, image 57), indeterminate. No other worrisome pulmonary nodule or mass.  Musculoskeletal: No acute osseous finding. No discrete lytic or blastic osseous lesions. CT ABDOMEN PELVIS FINDINGS Hepatobiliary: Liver demonstrates a normal contrast enhanced appearance. Gallbladder within normal limits. No biliary dilatation. Pancreas: Pancreas within normal limits. Spleen: Spleen enlarged measuring 14.3 cm in craniocaudad dimension. Few subtle hypodensities noted within the spleen, largest of which measures approximately 12 mm (series 3, image 57), indeterminate. Adrenals/Urinary Tract: Adrenal glands are normal. Kidneys equal in size with symmetric enhancement. No nephrolithiasis, hydronephrosis, or focal enhancing renal mass. No hydroureter. Bladder within normal limits. Stomach/Bowel: Stomach within normal limits. No evidence for bowel obstruction. Normal appendix. No acute inflammatory changes seen about the bowels. Vascular/Lymphatic: Normal intravascular enhancement seen throughout the intra-abdominal aorta. Mild aorto bi-iliac atherosclerotic disease. No aneurysm. Mesenteric vessels patent proximally. Enlarged 15 mm aortocaval lymph node (series 3, image 7). Additional multifocal shotty subcentimeter aortocaval and periaortic lymph nodes noted. No other pathologically enlarged intra-abdominal lymph nodes identified. Mildly prominent 12 mm left inguinal lymph nodes noted. No other adenopathy within the pelvis. Reproductive: Uterus within normal limits.  Ovaries normal. Other: Moderate volume free fluid within the pelvis, measuring simple fluid density. No free intraperitoneal air. Musculoskeletal: Scattered anasarca noted within  the external soft tissues. No acute osseous finding. No discrete lytic or blastic osseous lesions. IMPRESSION: CT NECK IMPRESSION 1. Increased number of shoddy subcentimeter lymph nodes throughout the neck bilaterally, left slightly worse than right. No pathologically enlarged lymph nodes identified. 2. Mild diffuse thyromegaly without discrete thyroid nodule or mass. 3. Mild soft tissue stranding with emphysema within the right lateral neck related to recent right-sided Port-A-Cath placement. CT CHEST IMPRESSION 1. Enlarged bilateral axillary and upper mediastinal adenopathy as above, likely related to provided history of lymphoma. Additional borderline enlarged left hilar node may be related to lymphoma or possibly reactive in nature. Attention at follow-up recommended. 2. Moderate layering bilateral pleural effusions with associated atelectasis. 3. Small to moderate pericardial effusion. 4. 4 mm right upper lobe ground-glass nodule, indeterminate. Attention at follow-up recommended. CT ABDOMEN AND PELVIS IMPRESSION 1. Enlarged 15 mm aortocaval lymph node with mildly enlarged 12 mm left inguinal lymph nodes. Additional increased number of shotty subcentimeter retroperitoneal lymph nodes. Findings most likely related to history of lymphoma. 2. Splenomegaly. 3. Moderate volume free fluid within the pelvis, of uncertain etiology, but could be physiologic and/or related overall volume status. 4. Mild diffuse anasarca. Electronically Signed   By: Jeannine Boga M.D.   On: 11/18/2018 19:56   Ct Biopsy  Result Date: 11/15/2018 INDICATION: Pancytopenia, concern for lymphoproliferative process EXAM: CT GUIDED RIGHT ILIAC BONE MARROW ASPIRATION AND CORE BIOPSY Date:  11/15/2018 11/15/2018 10:13 am Radiologist:  M. Daryll Brod, MD Guidance:  CT FLUOROSCOPY TIME:  Fluoroscopy Time: None. MEDICATIONS: 1% lidocaine local ANESTHESIA/SEDATION: 2.0 mg IV Versed; 50 mcg IV Fentanyl Moderate Sedation Time:  10 minutes The  patient was continuously monitored during the procedure by the interventional radiology nurse under my direct supervision. CONTRAST:  None. COMPLICATIONS: None PROCEDURE: Informed consent was obtained from the patient following explanation of the procedure, risks, benefits and alternatives. The patient understands, agrees and consents for the procedure. All questions were addressed. A time out was performed. The patient was positioned prone and non-contrast localization CT was performed of the pelvis to demonstrate the iliac marrow spaces. Maximal barrier sterile technique utilized including caps, mask, sterile gowns, sterile gloves, large sterile drape, hand hygiene, and Betadine prep. Under sterile conditions and local anesthesia, an 11 gauge coaxial bone biopsy needle was advanced into the  right iliac marrow space. Needle position was confirmed with CT imaging. Initially, bone marrow aspiration was performed. Next, the 11 gauge outer cannula was utilized to obtain a right iliac bone marrow core biopsy. Needle was removed. Hemostasis was obtained with compression. The patient tolerated the procedure well. Samples were prepared with the cytotechnologist. No immediate complications. IMPRESSION: CT guided right iliac bone marrow aspiration and core biopsy. Electronically Signed   By: Jerilynn Mages.  Shick M.D.   On: 11/15/2018 11:25   Dg Chest Port 1 View  Result Date: 11/11/2018 CLINICAL DATA:  Fever EXAM: PORTABLE CHEST 1 VIEW COMPARISON:  None. FINDINGS: Mild cardiomegaly. No focal consolidation or effusion. No pneumothorax. IMPRESSION: No active disease. Electronically Signed   By: Donavan Foil M.D.   On: 11/11/2018 21:59   Ct Bone Marrow Biopsy & Aspiration  Result Date: 11/15/2018 INDICATION: Pancytopenia, concern for lymphoproliferative process EXAM: CT GUIDED RIGHT ILIAC BONE MARROW ASPIRATION AND CORE BIOPSY Date:  11/15/2018 11/15/2018 10:13 am Radiologist:  M. Daryll Brod, MD Guidance:  CT FLUOROSCOPY TIME:   Fluoroscopy Time: None. MEDICATIONS: 1% lidocaine local ANESTHESIA/SEDATION: 2.0 mg IV Versed; 50 mcg IV Fentanyl Moderate Sedation Time:  10 minutes The patient was continuously monitored during the procedure by the interventional radiology nurse under my direct supervision. CONTRAST:  None. COMPLICATIONS: None PROCEDURE: Informed consent was obtained from the patient following explanation of the procedure, risks, benefits and alternatives. The patient understands, agrees and consents for the procedure. All questions were addressed. A time out was performed. The patient was positioned prone and non-contrast localization CT was performed of the pelvis to demonstrate the iliac marrow spaces. Maximal barrier sterile technique utilized including caps, mask, sterile gowns, sterile gloves, large sterile drape, hand hygiene, and Betadine prep. Under sterile conditions and local anesthesia, an 11 gauge coaxial bone biopsy needle was advanced into the right iliac marrow space. Needle position was confirmed with CT imaging. Initially, bone marrow aspiration was performed. Next, the 11 gauge outer cannula was utilized to obtain a right iliac bone marrow core biopsy. Needle was removed. Hemostasis was obtained with compression. The patient tolerated the procedure well. Samples were prepared with the cytotechnologist. No immediate complications. IMPRESSION: CT guided right iliac bone marrow aspiration and core biopsy. Electronically Signed   By: Jerilynn Mages.  Shick M.D.   On: 11/15/2018 11:25   Ir Imaging Guided Port Insertion  Result Date: 11/18/2018 INDICATION: 53 year old female with history lymphoma EXAM: IMPLANTED PORT A CATH PLACEMENT WITH ULTRASOUND AND FLUOROSCOPIC GUIDANCE MEDICATIONS: 2.0 g Ancef; The antibiotic was administered within an appropriate time interval prior to skin puncture. ANESTHESIA/SEDATION: Moderate (conscious) sedation was employed during this procedure. A total of Versed 2.0 mg and Fentanyl 100 mcg was  administered intravenously. Moderate Sedation Time: 18 minutes. The patient's level of consciousness and vital signs were monitored continuously by radiology nursing throughout the procedure under my direct supervision. FLUOROSCOPY TIME:  0 minutes, 12 seconds (1.0 mGy) COMPLICATIONS: None PROCEDURE: The procedure, risks, benefits, and alternatives were explained to the patient. Questions regarding the procedure were encouraged and answered. The patient understands and consents to the procedure. Ultrasound survey was performed with images stored and sent to PACs. The right neck and chest was prepped with chlorhexidine, and draped in the usual sterile fashion using maximum barrier technique (cap and mask, sterile gown, sterile gloves, large sterile sheet, hand hygiene and cutaneous antiseptic). Antibiotic prophylaxis was provided with 2.0g Ancef administered IV one hour prior to skin incision. Local anesthesia was attained by infiltration with  1% lidocaine without epinephrine. Ultrasound demonstrated patency of the right internal jugular vein, and this was documented with an image. Under real-time ultrasound guidance, this vein was accessed with a 21 gauge micropuncture needle and image documentation was performed. A small dermatotomy was made at the access site with an 11 scalpel. A 0.018" wire was advanced into the SVC and used to estimate the length of the internal catheter. The access needle exchanged for a 59F micropuncture vascular sheath. The 0.018" wire was then removed and a 0.035" wire advanced into the IVC. An appropriate location for the subcutaneous reservoir was selected below the clavicle and an incision was made through the skin and underlying soft tissues. The subcutaneous tissues were then dissected using a combination of blunt and sharp surgical technique and a pocket was formed. A single lumen power injectable portacatheter was then tunneled through the subcutaneous tissues from the pocket to the  dermatotomy and the port reservoir placed within the subcutaneous pocket. The venous access site was then serially dilated and a peel away vascular sheath placed over the wire. The wire was removed and the port catheter advanced into position under fluoroscopic guidance. The catheter tip is positioned in the cavoatrial junction. This was documented with a spot image. The portacatheter was then tested and found to flush and aspirate well. The port was flushed with saline followed by 100 units/mL heparinized saline. The pocket was then closed in two layers using first subdermal inverted interrupted absorbable sutures followed by a running subcuticular suture. The epidermis was then sealed with Dermabond. The dermatotomy at the venous access site was also seal with Dermabond. Patient tolerated the procedure well and remained hemodynamically stable throughout. No complications encountered and no significant blood loss encountered IMPRESSION: Status post right IJ port catheter placement. Catheter ready for use. Signed, Dulcy Fanny. Dellia Nims, RPVI [facility] Radiology Electronically Signed   By: Corrie Mckusick D.O.   On: 11/18/2018 11:23     ASSESSMENT/PLAN:  Stage IV Hodgkin lymphoma -Port placed in 10/2018; TTE showed normal LVEF -The patient received day 1 of cycle 1 of AVD + Adcedris on 11/19/2018.; plan for Granix x 5 days, starting 11/20/2018.  Tolerated her chemotherapy well overall. -Recommend PRN anti-emetics, including Zofran and Compazine - Anticipate WBC will begin to drop in the next few days.   Normocytic anemia -Likely secondary to anemia of chronic disease, underlying lymphoma, and recent chemotherapy.  -Hemoglobin is 6.3.  No active bleeding. Status post 1 unit packed red blood cells on 11/19/2018.  Recommend transfusion today.  -Supportive transfusion to keep Hgb > 7  Thrombocytopenia -Likely secondary to underlying lymphoma and recent  chemotherapy. -Patient denies any symptoms of bleeding -Daily CBC -Goal plts >10k in the absence of bleeding   Persistent fever -Secondary to underlying lymphoma -Patient has undergone extensive infectious work-up that did not identify any infection -Continue to monitor it for now - Recommend monitoring the patient to ensure that she is fever-free for at least 24 to 48 hrs. without Tylenol before discharge. -Still having fevers. Recommend discussion with ID to see if patient needs evaluation for atypical/fungal infections. Recommend Dexamethasone 4 mg BID if no work up for infection needed or work up negative.   Severe protein malnutrition -Patient has lost > 20 lbs since the onset of her symptoms, and her most recent albumin was 1.7 with anasarca, consistent with severe protein malnutrition -Dietitian is following. -If her nutrition remains very poor and she is unable to  increase her PO intake, we will have to consider enteral nutrition, such as PEG tube, to help improve her nutritional intake  Patient's daughter, Anderson Malta, [phone], requests to have periodic updates on the status of her mother's treatment. I provided an update to her daughter via telephone on 11/20/2018.  We will plan to update her again on 11/22/2018 after 230 pm per her request.   LOS: 11 days   Mikey Bussing, DNP, AGPCNP-BC, AOCNP 11/22/18   ADDENDUM: Called daughter at 96 and provided update. Daughter is planning to drive from [place] to [place] to see her mother this weekend.

## 2018-11-22 NOTE — Progress Notes (Signed)
Physical Therapy Treatment Patient Details Name: Cindy Ward MRN: [id number] DOB: Oct 09, 1965 Today's Date: 11/22/2018    History of Present Illness 53 yo female admitted to ED on 11/11/18 for 9 month history of N/V, fatigue, and weight loss. Pt previously followed in [place], oncology reports inconclusive findings. 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 Pt with R iliac BM aspiration and core, awaiting results. Other PMH includes HTN, hypothyroidism.    PT Comments    Pt requiring increased assist this session to perform bed mobility, and continues to be unsteady with ambulation. Pt tolerated LE exercises well, but fatigues quickly. Pt with increased processing time this session. PT continuing to recommend SNF for deficits and lack of safety awareness. PT to continue to follow acutely.   PT saw pt in between 2 RBC transfusions.    Follow Up Recommendations  Supervision for mobility/OOB;SNF     Equipment Recommendations  None recommended by PT    Recommendations for Other Services       Precautions / Restrictions Precautions Precautions: Fall Restrictions Weight Bearing Restrictions: Yes LUE Weight Bearing: (do not lift over 10 lbs for 2-3 weeks, where biopsy was performed)    Mobility  Bed Mobility Overal bed mobility: Needs Assistance Bed Mobility: Supine to Sit     Supine to sit: HOB elevated;Min assist     General bed mobility comments: Min assist for LE management, scooting to EOB. Increased time, use of bed rails.   Transfers Overall transfer level: Needs assistance Equipment used: None Transfers: Sit to/from Stand Sit to Stand: From elevated surface;Min assist         General transfer comment: Min assist for power up, pt walking UEs up LEs signifying proximal weakness. Pt with significant difficulty getting off toilet, able to perform pericare for self with increased time. Pt requires steadying  upon standing.   Ambulation/Gait Ambulation/Gait assistance: Min assist Gait Distance (Feet): 20 Feet(to and from bathroom) Assistive device: None Gait Pattern/deviations: Step-through pattern;Decreased stride length;Scissoring;Narrow base of support Gait velocity: decr   General Gait Details: Min assist for steadying, recovering balance with scissoring x2. pt limited by fatigue and LE weakness.    Stairs             Wheelchair Mobility    Modified Rankin (Stroke Patients Only)       Balance Overall balance assessment: Needs assistance;History of Falls Sitting-balance support: No upper extremity supported Sitting balance-Leahy Scale: Good     Standing balance support: No upper extremity supported;During functional activity Standing balance-Leahy Scale: Poor Standing balance comment: LOB recovered with PT assist, PT very close guard                             Cognition Arousal/Alertness: Awake/alert Behavior During Therapy: Flat affect;WFL for tasks assessed/performed Overall Cognitive Status: Impaired/Different from baseline Area of Impairment: Problem solving;Following commands;Safety/judgement                       Following Commands: Follows one step commands consistently Safety/Judgement: Decreased awareness of safety;Decreased awareness of deficits   Problem Solving: Slow processing;Difficulty sequencing;Requires verbal cues;Requires tactile cues        Exercises General Exercises - Lower Extremity Ankle Circles/Pumps: AROM;Both;20 reps;Supine Quad Sets: AROM;Both;10 reps;Supine Long Arc Quad: AROM;Both;10 reps;Seated Heel Slides: AAROM;Both;10 reps;Supine Toe Raises: AAROM;Both;10 reps;Supine    General Comments  Pertinent Vitals/Pain Pain Assessment: No/denies pain    Home Living                      Prior Function            PT Goals (current goals can now be found in the care plan section) Acute  Rehab PT Goals Patient Stated Goal: feel stronger  PT Goal Formulation: With patient Time For Goal Achievement: 12/03/18 Potential to Achieve Goals: Good Progress towards PT goals: Progressing toward goals    Frequency    Min 2X/week      PT Plan Current plan remains appropriate    Co-evaluation              AM-PAC PT "6 Clicks" Mobility   Outcome Measure  Help needed turning from your back to your side while in a flat bed without using bedrails?: A Lot Help needed moving from lying on your back to sitting on the side of a flat bed without using bedrails?: A Lot Help needed moving to and from a bed to a chair (including a wheelchair)?: A Little Help needed standing up from a chair using your arms (e.g., wheelchair or bedside chair)?: A Little Help needed to walk in hospital room?: A Little Help needed climbing 3-5 steps with a railing? : A Lot 6 Click Score: 15    End of Session Equipment Utilized During Treatment: Gait belt Activity Tolerance: Patient limited by fatigue Patient left: in chair;with chair alarm set;with call bell/phone within reach;with family/visitor present Nurse Communication: Mobility status PT Visit Diagnosis: Other abnormalities of gait and mobility (R26.89);Difficulty in walking, not elsewhere classified (R26.2);Muscle weakness (generalized) (M62.81)     Time: 7078-6754 PT Time Calculation (min) (ACUTE ONLY): 24 min  Charges:  $Gait Training: 8-22 mins $Therapeutic Exercise: 8-22 mins                     Julien Girt, PT Acute Rehabilitation Services Pager [phone]  Office [phone]   Roxine Caddy D [name] 11/22/2018, 5:58 PM

## 2018-11-22 NOTE — Progress Notes (Signed)
[facility]                                                                              Patient Demographics  Cindy Ward, is a 53 y.o. female, DOB - [birth year], VWP:[id number]  Admit date - 11/11/2018   Admitting Physician A Melven Sartorius., MD  Outpatient Primary MD for the patient is [name], MD  Outpatient specialists:   LOS - 11  days   Medical records reviewed and are as summarized below:    No chief complaint on file.      Brief summary   53 y.o.femalewith medical history significant ofhypertension hypothyroidism presents at [facility] with recurrent nausea, vomiting, fevers, and night sweats of 9 months duration.She notes that the symptoms occur about every 2 weeks. She has nightly fevers and night sweats. The symptoms occur for days at a time, but have been taking longer and longer to resolve.She came to the hospital today because she passed out at the cancer center. She describes getting dizzy and then hitting the floor. She was seen in the emergency department at [facility]. Her vitals were notable for a fever to 102.7. Multiple enlarged lymph nodes noted on CT abdomen and pelvis. The case was discussed with [name] who recommended transfer to [facility] for further workup as well as LDH, haptoglobin, and retics. 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 Hospital course complicated by recurrent high fevers related to malignancy.   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 diagnosed, stage IV -Patient presented with persistent fevers, lymphadenopathy, no active infective process -Lymph node biopsies on 2/18 showed classical Hodgkin lymphoma -CT chest abdomen and pelvis, neck showed a diffuse lymphadenopathy involving the cervical, thoracic and abdominal lymph nodes -Port-A-Cath placed on 2/24, started on chemotherapy on 2/25, next cycle  after 2 weeks, -Management per oncology  Fevers -Continues to spike fevers, 102.9 F last night with tachycardia, tachypnea  -Chest x-ray 2/27 did not show any pneumonia, blood cultures negative so far, influenza panel negative -On exam had bibasilar crackles with 13 L positive, IV fluids were discontinued and patient started on Lasix 40 mg daily -Discussed in detail with Dr. Megan Salon, infectious disease who had last seen the patient on 2/24, patient had extensive work-up during hospitalization for FUO. Dr. Megan Salon recommended treating her symptomatically, NSAIDs and continue management for lymphoma.  From ID standpoint, nothing to add at this point.   Acute on chronic normocytic anemia with thrombocytopenia, splenomegaly -Likely secondary to underlying lymphoma -Supportive transfusion to keep hemoglobin above 7, goal platelets > 10K -Hemoglobin down to 6.3, transfused 2 units packed RBCs -Platelet count also trending down, 30 2K, follow closely  Severe protein calorie malnutrition in the setting of malignancy -Dietitian following, continue nutritional supplements  Small pericardial effusion 2D echo on 2/18 showed EF of 55 to 60%, small pericardial effusion most prominently along the RV/RA, stable, no tamponade physiology Outpatient follow-up with periodic EKGs  Near syncope Likely secondary to anemia, dehydration, #1  Hypothyroidism Continue Synthroid   Code Status: Full code DVT Prophylaxis:  Lovenox  Family Communication: Discussed in detail with the patient, all imaging results, lab results explained to the patient   Disposition Plan: When fever free for at least 24 hours, case management consult placed for financial assistance.  Time Spent in minutes 25 minutes  Procedures:  2D echo  Consultants:   Oncology Infectious disease  Antimicrobials:   Anti-infectives (From admission, onward)   Start     Dose/Rate Route Frequency Ordered Stop   11/18/18 0600   ceFAZolin (ANCEF) IVPB 2g/100 mL premix     2 g 200 mL/hr over 30 Minutes Intravenous To Radiology 11/17/18 1243 11/18/18 1130   11/12/18 0800  cefTRIAXone (ROCEPHIN) 2 g in sodium chloride 0.9 % 100 mL IVPB  Status:  Discontinued     2 g 200 mL/hr over 30 Minutes Intravenous Every 24 hours 11/11/18 1711 11/12/18 1057         Medications  Scheduled Meds: . enoxaparin (LOVENOX) injection  40 mg Subcutaneous Q24H  . feeding supplement  1 Container Oral TID BM  . furosemide  20 mg Intravenous Once  . furosemide  40 mg Oral Daily  . levothyroxine  75 mcg Oral Q0600  . mouth rinse  15 mL Mouth Rinse BID  . multivitamin with minerals  1 tablet Oral Daily  . protein supplement  8 oz Oral q12n4p  . scopolamine  1 patch Transdermal Q72H  . Tbo-filgastrim (GRANIX) SQ  480 mcg Subcutaneous q1800   Continuous Infusions: . sodium chloride 250 mL (11/19/18 1356)  . sodium chloride     PRN Meds:.sodium chloride, acetaminophen **OR** acetaminophen, HYDROmorphone (DILAUDID) injection, ibuprofen, iohexol, lip balm, ondansetron **OR** ondansetron (ZOFRAN) IV, oxyCODONE, polyethylene glycol, sodium chloride flush      Subjective:   Cindy Ward was seen and examined today.  Overnight spiking fevers, 103 F.  Feels weak, states trying to eat to build up appetite.  Denies any chest pain, abdominal pain, nausea or vomiting or diarrhea.  Objective:   Vitals:   11/22/18 0200 11/22/18 0206 11/22/18 0214 11/22/18 0514  BP:  131/69  124/77  Pulse: (!) 121 (!) 122 (!) 122 92  Resp: (!) 36 (!) 36 (!) 22 20  Temp:  (!) 103 F (39.4 C) (!) 101.2 F (38.4 C) 98.2 F (36.8 C)  TempSrc:  Oral Oral Oral  SpO2: 96% 97% 98% 96%  Weight:    76.2 kg  Height:        Intake/Output Summary (Last 24 hours) at 11/22/2018 1137 Last data filed at 11/22/2018 0105 Gross per 24 hour  Intake 250 ml  Output 300 ml  Net -50 ml     Wt Readings from Last 3 Encounters:  11/22/18 76.2 kg  06/27/18 78.4 kg    05/14/18 79.4 kg   Physical Exam  General: Alert and oriented x 3, ill-appearing  Eyes:   HEENT:  Atraumatic, normocephalic  Cardiovascular: S1 S2 clear, no murmurs, RRR.  1+ pedal edema b/l  Respiratory: Bibasilar crackles  Gastrointestinal: Soft, nontender, nondistended, NBS  Ext: 1+ pedal edema bilaterally  Neuro: no new deficits  Musculoskeletal: No cyanosis, clubbing  Skin: No rashes  Psych: Normal affect and demeanor, alert and oriented x3      Data Reviewed:  I have personally reviewed following labs and imaging studies  Micro Results Recent Results (from the past 240 hour(s))  Culture, blood (routine x 2)     Status: None   Collection Time: 11/13/18  3:54 PM  Result Value Ref Range Status  Specimen Description   Final    BLOOD RIGHT ANTECUBITAL Performed at [place] [address], [place], [place] [address]    Special Requests   Final    BOTTLES DRAWN AEROBIC AND ANAEROBIC Blood Culture adequate volume Performed at [place] [address], [place], [place] [address]    Culture   Final    NO GROWTH 5 DAYS Performed at [facility], [place] [address], [place], [place] [address]    Report Status 11/18/2018 FINAL  Final  Culture, blood (routine x 2)     Status: None   Collection Time: 11/13/18  4:02 PM  Result Value Ref Range Status   Specimen Description   Final    BLOOD RIGHT FOREARM Performed at [facility], [place] [address] [place], [place] [address]    Special Requests   Final    BOTTLES DRAWN AEROBIC AND ANAEROBIC Blood Culture adequate volume Performed at [place] [address], [place], [place] [address]    Culture   Final    NO GROWTH 5 DAYS Performed at [facility], [place] [address], [place], [place] [address]    Report Status 11/18/2018 FINAL  Final  Culture, Urine     Status: None   Collection Time: 11/14/18  4:20 PM  Result Value Ref Range  Status   Specimen Description   Final    URINE, CLEAN CATCH Performed at [facility], [place] [address], [place], [place] [address]    Special Requests   Final    NONE Performed at [facility], [place] [address] [place], [place] [address]    Culture   Final    NO GROWTH Performed at [facility], [place] [address], [place], [place] [address]    Report Status 11/15/2018 FINAL  Final  Culture, blood (routine x 2)     Status: None (Preliminary result)   Collection Time: 11/21/18  2:51 PM  Result Value Ref Range Status   Specimen Description BLOOD LEFT ANTECUBITAL  Final   Special Requests   Final    BOTTLES DRAWN AEROBIC AND ANAEROBIC Blood Culture adequate volume Performed at [place] [address], [place], [place] [address]    Culture NO GROWTH < 24 HOURS  Final   Report Status PENDING  Incomplete  Culture, blood (routine x 2)     Status: None (Preliminary result)   Collection Time: 11/21/18  2:59 PM  Result Value Ref Range Status   Specimen Description BLOOD RIGHT ANTECUBITAL  Final   Special Requests   Final    BOTTLES DRAWN AEROBIC ONLY Blood Culture adequate volume Performed at [place] [address] [place], [place] [address]    Culture NO GROWTH < 24 HOURS  Final   Report Status PENDING  Incomplete    Radiology Reports Dg Chest 2 View  Result Date: 11/21/2018 CLINICAL DATA:  History of lymphoma.  Patient weak and flushed. EXAM: CHEST - 2 VIEW COMPARISON:  Chest CT, 11/18/2018.  Chest radiographs, 11/11/2018. FINDINGS: Mild enlargement of the cardiopericardial silhouette. No mediastinal or hilar masses. No convincing adenopathy. Small bilateral pleural effusions. Mild dependent lower lobe atelectasis. Lungs otherwise clear. No pneumothorax. Right anterior chest wall, internal jugular, Port-A-Cath is stable from the prior chest CT. Skeletal structures are unremarkable. IMPRESSION: 1.  Findings are similar to the recent prior chest CT. Enlargement of the cardiopericardial silhouette is consistent  with the pericardial effusion noted on CT. There are small bilateral pleural effusions with associated lower lobe atelectasis. No convincing pneumonia and no pulmonary edema. Electronically Signed   By: Lajean Manes M.D.   On: 11/21/2018 15:05   Ct Soft Tissue Neck W Contrast  Result Date: 11/18/2018 CLINICAL DATA:  Initial evaluation for Hodgkin's lymphoma, initial workup. EXAM: CT NECK WITH CONTRAST CT CHEST, ABDOMEN, AND PELVIS WITH CONTRAST TECHNIQUE: Multidetector CT imaging of the chest, abdomen and pelvis was performed following the standard protocol during bolus administration of intravenous contrast. CONTRAST:  179m OMNIPAQUE IOHEXOL 300 MG/ML SOLN, 374mOMNIPAQUE IOHEXOL 300 MG/ML SOLN COMPARISON:  None. FINDINGS: CT NECK FINDINGS: Pharynx and larynx: Oral cavity within normal limits without mass lesion or loculated collection. Patient is largely edentulous. Calcified tonsilliths noted within the right palatine tonsil. Tonsils themselves symmetric and within normal limits bilaterally. Parapharyngeal fat maintained. Nasopharynx normal. No retropharyngeal collection. Epiglottis normal. Vallecula clear. Remainder of the hypopharynx and supraglottic larynx normal. Glottis within normal limits. Subglottic airway clear. Salivary glands: Parotid and submandibular glands within normal limits. Thyroid: Thyroid diffusely enlarged without discrete nodule or mass. Lymph nodes: Mildly prominent level II lymph nodes measure up to 9 mm in short axis bilaterally. Left level III nodes measure up to 9 mm as well. Increased number of shotty subcentimeter nodes within the neck bilaterally, left slightly worse than right. No other pathologically enlarged lymph nodes identified within the neck. Vascular: Normal intravascular enhancement seen throughout the neck. Right IJ approach central venous catheter in  place. Soft tissue swelling with stranding and scattered foci of soft tissue emphysema within the right neck likely related to central line placement. Limited intracranial: Unremarkable Visualized orbits: Partially visualized inferior globes and orbits unremarkable. Mastoids and visualized paranasal sinuses: Mucosal thickening noted within the visualized maxillary and sphenoid sinuses. Visualized paranasal sinuses are otherwise clear. Visualize mastoids and middle ear cavities are clear. Skeleton: No acute osseous abnormality. No discrete lytic or blastic osseous lesions. Mild cervical spondylolysis present at C5-6. Other: None. CT CHEST FINDINGS Cardiovascular: Normal intravascular enhancement seen throughout the intra-abdominal aorta without aneurysm or other acute abnormality. Minimal atheromatous plaque within the aortic arch. Visualized great vessels within normal limits. Heart size normal. Small moderate pericardial effusion, simple fluid density. Mediastinum/Nodes: Enlarged nodes within the upper mediastinum measure up to 15 mm in short axis (series 3, image 11). No appreciable supraclavicular adenopathy. No other pathologically enlarged mediastinal lymph nodes. Mild soft tissue density within the right hilum without frank adenopathy. Mildly enlarged 11 mm left hilar node (series 3, image 28). Enlarged axillary adenopathy seen bilaterally, with the largest node on the left measuring 2.1 cm in short axis (series 3, image 16). Largest node on the right measures 1.7 cm in short axis (series 3, image 8). Scattered soft tissue stranding with emphysema and fluid density overlying the left axilla likely related to recent lymph node biopsy, partially visualized. Esophagus within normal limits. Lungs/Pleura: Tracheobronchial tree intact and patent. Moderate layering bilateral pleural effusions with associated atelectasis. No other airspace consolidation. No pulmonary edema. No pneumothorax. 4 mm subpleural  ground-glass nodule present at the anterior right upper lobe (series 4, image 57), indeterminate. No other worrisome pulmonary nodule or mass. Musculoskeletal: No acute osseous finding. No discrete lytic or blastic osseous lesions. CT ABDOMEN PELVIS FINDINGS Hepatobiliary: Liver demonstrates a normal contrast enhanced appearance. Gallbladder within normal limits. No biliary dilatation. Pancreas: Pancreas within normal limits. Spleen: Spleen enlarged measuring 14.3 cm in craniocaudad dimension. Few subtle hypodensities  noted within the spleen, largest of which measures approximately 12 mm (series 3, image 57), indeterminate. Adrenals/Urinary Tract: Adrenal glands are normal. Kidneys equal in size with symmetric enhancement. No nephrolithiasis, hydronephrosis, or focal enhancing renal mass. No hydroureter. Bladder within normal limits. Stomach/Bowel: Stomach within normal limits. No evidence for bowel obstruction. Normal appendix. No acute inflammatory changes seen about the bowels. Vascular/Lymphatic: Normal intravascular enhancement seen throughout the intra-abdominal aorta. Mild aorto bi-iliac atherosclerotic disease. No aneurysm. Mesenteric vessels patent proximally. Enlarged 15 mm aortocaval lymph node (series 3, image 7). Additional multifocal shotty subcentimeter aortocaval and periaortic lymph nodes noted. No other pathologically enlarged intra-abdominal lymph nodes identified. Mildly prominent 12 mm left inguinal lymph nodes noted. No other adenopathy within the pelvis. Reproductive: Uterus within normal limits.  Ovaries normal. Other: Moderate volume free fluid within the pelvis, measuring simple fluid density. No free intraperitoneal air. Musculoskeletal: Scattered anasarca noted within the external soft tissues. No acute osseous finding. No discrete lytic or blastic osseous lesions. IMPRESSION: CT NECK IMPRESSION 1. Increased number of shoddy subcentimeter lymph nodes throughout the neck bilaterally, left  slightly worse than right. No pathologically enlarged lymph nodes identified. 2. Mild diffuse thyromegaly without discrete thyroid nodule or mass. 3. Mild soft tissue stranding with emphysema within the right lateral neck related to recent right-sided Port-A-Cath placement. CT CHEST IMPRESSION 1. Enlarged bilateral axillary and upper mediastinal adenopathy as above, likely related to provided history of lymphoma. Additional borderline enlarged left hilar node may be related to lymphoma or possibly reactive in nature. Attention at follow-up recommended. 2. Moderate layering bilateral pleural effusions with associated atelectasis. 3. Small to moderate pericardial effusion. 4. 4 mm right upper lobe ground-glass nodule, indeterminate. Attention at follow-up recommended. CT ABDOMEN AND PELVIS IMPRESSION 1. Enlarged 15 mm aortocaval lymph node with mildly enlarged 12 mm left inguinal lymph nodes. Additional increased number of shotty subcentimeter retroperitoneal lymph nodes. Findings most likely related to history of lymphoma. 2. Splenomegaly. 3. Moderate volume free fluid within the pelvis, of uncertain etiology, but could be physiologic and/or related overall volume status. 4. Mild diffuse anasarca. Electronically Signed   By: Jeannine Boga M.D.   On: 11/18/2018 19:56   Ct Chest W Contrast  Result Date: 11/18/2018 CLINICAL DATA:  Initial evaluation for Hodgkin's lymphoma, initial workup. EXAM: CT NECK WITH CONTRAST CT CHEST, ABDOMEN, AND PELVIS WITH CONTRAST TECHNIQUE: Multidetector CT imaging of the chest, abdomen and pelvis was performed following the standard protocol during bolus administration of intravenous contrast. CONTRAST:  185m OMNIPAQUE IOHEXOL 300 MG/ML SOLN, 3104mOMNIPAQUE IOHEXOL 300 MG/ML SOLN COMPARISON:  None. FINDINGS: CT NECK FINDINGS: Pharynx and larynx: Oral cavity within normal limits without mass lesion or loculated collection. Patient is largely edentulous. Calcified tonsilliths  noted within the right palatine tonsil. Tonsils themselves symmetric and within normal limits bilaterally. Parapharyngeal fat maintained. Nasopharynx normal. No retropharyngeal collection. Epiglottis normal. Vallecula clear. Remainder of the hypopharynx and supraglottic larynx normal. Glottis within normal limits. Subglottic airway clear. Salivary glands: Parotid and submandibular glands within normal limits. Thyroid: Thyroid diffusely enlarged without discrete nodule or mass. Lymph nodes: Mildly prominent level II lymph nodes measure up to 9 mm in short axis bilaterally. Left level III nodes measure up to 9 mm as well. Increased number of shotty subcentimeter nodes within the neck bilaterally, left slightly worse than right. No other pathologically enlarged lymph nodes identified within the neck. Vascular: Normal intravascular enhancement seen throughout the neck. Right IJ approach central venous catheter in place. Soft tissue swelling with  stranding and scattered foci of soft tissue emphysema within the right neck likely related to central line placement. Limited intracranial: Unremarkable Visualized orbits: Partially visualized inferior globes and orbits unremarkable. Mastoids and visualized paranasal sinuses: Mucosal thickening noted within the visualized maxillary and sphenoid sinuses. Visualized paranasal sinuses are otherwise clear. Visualize mastoids and middle ear cavities are clear. Skeleton: No acute osseous abnormality. No discrete lytic or blastic osseous lesions. Mild cervical spondylolysis present at C5-6. Other: None. CT CHEST FINDINGS Cardiovascular: Normal intravascular enhancement seen throughout the intra-abdominal aorta without aneurysm or other acute abnormality. Minimal atheromatous plaque within the aortic arch. Visualized great vessels within normal limits. Heart size normal. Small moderate pericardial effusion, simple fluid density. Mediastinum/Nodes: Enlarged nodes within the upper  mediastinum measure up to 15 mm in short axis (series 3, image 11). No appreciable supraclavicular adenopathy. No other pathologically enlarged mediastinal lymph nodes. Mild soft tissue density within the right hilum without frank adenopathy. Mildly enlarged 11 mm left hilar node (series 3, image 28). Enlarged axillary adenopathy seen bilaterally, with the largest node on the left measuring 2.1 cm in short axis (series 3, image 16). Largest node on the right measures 1.7 cm in short axis (series 3, image 8). Scattered soft tissue stranding with emphysema and fluid density overlying the left axilla likely related to recent lymph node biopsy, partially visualized. Esophagus within normal limits. Lungs/Pleura: Tracheobronchial tree intact and patent. Moderate layering bilateral pleural effusions with associated atelectasis. No other airspace consolidation. No pulmonary edema. No pneumothorax. 4 mm subpleural ground-glass nodule present at the anterior right upper lobe (series 4, image 57), indeterminate. No other worrisome pulmonary nodule or mass. Musculoskeletal: No acute osseous finding. No discrete lytic or blastic osseous lesions. CT ABDOMEN PELVIS FINDINGS Hepatobiliary: Liver demonstrates a normal contrast enhanced appearance. Gallbladder within normal limits. No biliary dilatation. Pancreas: Pancreas within normal limits. Spleen: Spleen enlarged measuring 14.3 cm in craniocaudad dimension. Few subtle hypodensities noted within the spleen, largest of which measures approximately 12 mm (series 3, image 57), indeterminate. Adrenals/Urinary Tract: Adrenal glands are normal. Kidneys equal in size with symmetric enhancement. No nephrolithiasis, hydronephrosis, or focal enhancing renal mass. No hydroureter. Bladder within normal limits. Stomach/Bowel: Stomach within normal limits. No evidence for bowel obstruction. Normal appendix. No acute inflammatory changes seen about the bowels. Vascular/Lymphatic: Normal  intravascular enhancement seen throughout the intra-abdominal aorta. Mild aorto bi-iliac atherosclerotic disease. No aneurysm. Mesenteric vessels patent proximally. Enlarged 15 mm aortocaval lymph node (series 3, image 7). Additional multifocal shotty subcentimeter aortocaval and periaortic lymph nodes noted. No other pathologically enlarged intra-abdominal lymph nodes identified. Mildly prominent 12 mm left inguinal lymph nodes noted. No other adenopathy within the pelvis. Reproductive: Uterus within normal limits.  Ovaries normal. Other: Moderate volume free fluid within the pelvis, measuring simple fluid density. No free intraperitoneal air. Musculoskeletal: Scattered anasarca noted within the external soft tissues. No acute osseous finding. No discrete lytic or blastic osseous lesions. IMPRESSION: CT NECK IMPRESSION 1. Increased number of shoddy subcentimeter lymph nodes throughout the neck bilaterally, left slightly worse than right. No pathologically enlarged lymph nodes identified. 2. Mild diffuse thyromegaly without discrete thyroid nodule or mass. 3. Mild soft tissue stranding with emphysema within the right lateral neck related to recent right-sided Port-A-Cath placement. CT CHEST IMPRESSION 1. Enlarged bilateral axillary and upper mediastinal adenopathy as above, likely related to provided history of lymphoma. Additional borderline enlarged left hilar node may be related to lymphoma or possibly reactive in nature. Attention at follow-up recommended. 2. Moderate layering  bilateral pleural effusions with associated atelectasis. 3. Small to moderate pericardial effusion. 4. 4 mm right upper lobe ground-glass nodule, indeterminate. Attention at follow-up recommended. CT ABDOMEN AND PELVIS IMPRESSION 1. Enlarged 15 mm aortocaval lymph node with mildly enlarged 12 mm left inguinal lymph nodes. Additional increased number of shotty subcentimeter retroperitoneal lymph nodes. Findings most likely related to  history of lymphoma. 2. Splenomegaly. 3. Moderate volume free fluid within the pelvis, of uncertain etiology, but could be physiologic and/or related overall volume status. 4. Mild diffuse anasarca. Electronically Signed   By: Jeannine Boga M.D.   On: 11/18/2018 19:56   Ct Abdomen Pelvis W Contrast  Result Date: 11/18/2018 CLINICAL DATA:  Initial evaluation for Hodgkin's lymphoma, initial workup. EXAM: CT NECK WITH CONTRAST CT CHEST, ABDOMEN, AND PELVIS WITH CONTRAST TECHNIQUE: Multidetector CT imaging of the chest, abdomen and pelvis was performed following the standard protocol during bolus administration of intravenous contrast. CONTRAST:  115m OMNIPAQUE IOHEXOL 300 MG/ML SOLN, 344mOMNIPAQUE IOHEXOL 300 MG/ML SOLN COMPARISON:  None. FINDINGS: CT NECK FINDINGS: Pharynx and larynx: Oral cavity within normal limits without mass lesion or loculated collection. Patient is largely edentulous. Calcified tonsilliths noted within the right palatine tonsil. Tonsils themselves symmetric and within normal limits bilaterally. Parapharyngeal fat maintained. Nasopharynx normal. No retropharyngeal collection. Epiglottis normal. Vallecula clear. Remainder of the hypopharynx and supraglottic larynx normal. Glottis within normal limits. Subglottic airway clear. Salivary glands: Parotid and submandibular glands within normal limits. Thyroid: Thyroid diffusely enlarged without discrete nodule or mass. Lymph nodes: Mildly prominent level II lymph nodes measure up to 9 mm in short axis bilaterally. Left level III nodes measure up to 9 mm as well. Increased number of shotty subcentimeter nodes within the neck bilaterally, left slightly worse than right. No other pathologically enlarged lymph nodes identified within the neck. Vascular: Normal intravascular enhancement seen throughout the neck. Right IJ approach central venous catheter in place. Soft tissue swelling with stranding and scattered foci of soft tissue emphysema  within the right neck likely related to central line placement. Limited intracranial: Unremarkable Visualized orbits: Partially visualized inferior globes and orbits unremarkable. Mastoids and visualized paranasal sinuses: Mucosal thickening noted within the visualized maxillary and sphenoid sinuses. Visualized paranasal sinuses are otherwise clear. Visualize mastoids and middle ear cavities are clear. Skeleton: No acute osseous abnormality. No discrete lytic or blastic osseous lesions. Mild cervical spondylolysis present at C5-6. Other: None. CT CHEST FINDINGS Cardiovascular: Normal intravascular enhancement seen throughout the intra-abdominal aorta without aneurysm or other acute abnormality. Minimal atheromatous plaque within the aortic arch. Visualized great vessels within normal limits. Heart size normal. Small moderate pericardial effusion, simple fluid density. Mediastinum/Nodes: Enlarged nodes within the upper mediastinum measure up to 15 mm in short axis (series 3, image 11). No appreciable supraclavicular adenopathy. No other pathologically enlarged mediastinal lymph nodes. Mild soft tissue density within the right hilum without frank adenopathy. Mildly enlarged 11 mm left hilar node (series 3, image 28). Enlarged axillary adenopathy seen bilaterally, with the largest node on the left measuring 2.1 cm in short axis (series 3, image 16). Largest node on the right measures 1.7 cm in short axis (series 3, image 8). Scattered soft tissue stranding with emphysema and fluid density overlying the left axilla likely related to recent lymph node biopsy, partially visualized. Esophagus within normal limits. Lungs/Pleura: Tracheobronchial tree intact and patent. Moderate layering bilateral pleural effusions with associated atelectasis. No other airspace consolidation. No pulmonary edema. No pneumothorax. 4 mm subpleural ground-glass nodule present at the anterior  right upper lobe (series 4, image 57), indeterminate.  No other worrisome pulmonary nodule or mass. Musculoskeletal: No acute osseous finding. No discrete lytic or blastic osseous lesions. CT ABDOMEN PELVIS FINDINGS Hepatobiliary: Liver demonstrates a normal contrast enhanced appearance. Gallbladder within normal limits. No biliary dilatation. Pancreas: Pancreas within normal limits. Spleen: Spleen enlarged measuring 14.3 cm in craniocaudad dimension. Few subtle hypodensities noted within the spleen, largest of which measures approximately 12 mm (series 3, image 57), indeterminate. Adrenals/Urinary Tract: Adrenal glands are normal. Kidneys equal in size with symmetric enhancement. No nephrolithiasis, hydronephrosis, or focal enhancing renal mass. No hydroureter. Bladder within normal limits. Stomach/Bowel: Stomach within normal limits. No evidence for bowel obstruction. Normal appendix. No acute inflammatory changes seen about the bowels. Vascular/Lymphatic: Normal intravascular enhancement seen throughout the intra-abdominal aorta. Mild aorto bi-iliac atherosclerotic disease. No aneurysm. Mesenteric vessels patent proximally. Enlarged 15 mm aortocaval lymph node (series 3, image 7). Additional multifocal shotty subcentimeter aortocaval and periaortic lymph nodes noted. No other pathologically enlarged intra-abdominal lymph nodes identified. Mildly prominent 12 mm left inguinal lymph nodes noted. No other adenopathy within the pelvis. Reproductive: Uterus within normal limits.  Ovaries normal. Other: Moderate volume free fluid within the pelvis, measuring simple fluid density. No free intraperitoneal air. Musculoskeletal: Scattered anasarca noted within the external soft tissues. No acute osseous finding. No discrete lytic or blastic osseous lesions. IMPRESSION: CT NECK IMPRESSION 1. Increased number of shoddy subcentimeter lymph nodes throughout the neck bilaterally, left slightly worse than right. No pathologically enlarged lymph nodes identified. 2. Mild diffuse  thyromegaly without discrete thyroid nodule or mass. 3. Mild soft tissue stranding with emphysema within the right lateral neck related to recent right-sided Port-A-Cath placement. CT CHEST IMPRESSION 1. Enlarged bilateral axillary and upper mediastinal adenopathy as above, likely related to provided history of lymphoma. Additional borderline enlarged left hilar node may be related to lymphoma or possibly reactive in nature. Attention at follow-up recommended. 2. Moderate layering bilateral pleural effusions with associated atelectasis. 3. Small to moderate pericardial effusion. 4. 4 mm right upper lobe ground-glass nodule, indeterminate. Attention at follow-up recommended. CT ABDOMEN AND PELVIS IMPRESSION 1. Enlarged 15 mm aortocaval lymph node with mildly enlarged 12 mm left inguinal lymph nodes. Additional increased number of shotty subcentimeter retroperitoneal lymph nodes. Findings most likely related to history of lymphoma. 2. Splenomegaly. 3. Moderate volume free fluid within the pelvis, of uncertain etiology, but could be physiologic and/or related overall volume status. 4. Mild diffuse anasarca. Electronically Signed   By: Jeannine Boga M.D.   On: 11/18/2018 19:56   Ct Biopsy  Result Date: 11/15/2018 INDICATION: Pancytopenia, concern for lymphoproliferative process EXAM: CT GUIDED RIGHT ILIAC BONE MARROW ASPIRATION AND CORE BIOPSY Date:  11/15/2018 11/15/2018 10:13 am Radiologist:  M. Daryll Brod, MD Guidance:  CT FLUOROSCOPY TIME:  Fluoroscopy Time: None. MEDICATIONS: 1% lidocaine local ANESTHESIA/SEDATION: 2.0 mg IV Versed; 50 mcg IV Fentanyl Moderate Sedation Time:  10 minutes The patient was continuously monitored during the procedure by the interventional radiology nurse under my direct supervision. CONTRAST:  None. COMPLICATIONS: None PROCEDURE: Informed consent was obtained from the patient following explanation of the procedure, risks, benefits and alternatives. The patient understands,  agrees and consents for the procedure. All questions were addressed. A time out was performed. The patient was positioned prone and non-contrast localization CT was performed of the pelvis to demonstrate the iliac marrow spaces. Maximal barrier sterile technique utilized including caps, mask, sterile gowns, sterile gloves, large sterile drape, hand hygiene, and Betadine prep. Under  sterile conditions and local anesthesia, an 11 gauge coaxial bone biopsy needle was advanced into the right iliac marrow space. Needle position was confirmed with CT imaging. Initially, bone marrow aspiration was performed. Next, the 11 gauge outer cannula was utilized to obtain a right iliac bone marrow core biopsy. Needle was removed. Hemostasis was obtained with compression. The patient tolerated the procedure well. Samples were prepared with the cytotechnologist. No immediate complications. IMPRESSION: CT guided right iliac bone marrow aspiration and core biopsy. Electronically Signed   By: Jerilynn Mages.  Shick M.D.   On: 11/15/2018 11:25   Dg Chest Port 1 View  Result Date: 11/11/2018 CLINICAL DATA:  Fever EXAM: PORTABLE CHEST 1 VIEW COMPARISON:  None. FINDINGS: Mild cardiomegaly. No focal consolidation or effusion. No pneumothorax. IMPRESSION: No active disease. Electronically Signed   By: Donavan Foil M.D.   On: 11/11/2018 21:59   Ct Bone Marrow Biopsy & Aspiration  Result Date: 11/15/2018 INDICATION: Pancytopenia, concern for lymphoproliferative process EXAM: CT GUIDED RIGHT ILIAC BONE MARROW ASPIRATION AND CORE BIOPSY Date:  11/15/2018 11/15/2018 10:13 am Radiologist:  M. Daryll Brod, MD Guidance:  CT FLUOROSCOPY TIME:  Fluoroscopy Time: None. MEDICATIONS: 1% lidocaine local ANESTHESIA/SEDATION: 2.0 mg IV Versed; 50 mcg IV Fentanyl Moderate Sedation Time:  10 minutes The patient was continuously monitored during the procedure by the interventional radiology nurse under my direct supervision. CONTRAST:  None. COMPLICATIONS: None  PROCEDURE: Informed consent was obtained from the patient following explanation of the procedure, risks, benefits and alternatives. The patient understands, agrees and consents for the procedure. All questions were addressed. A time out was performed. The patient was positioned prone and non-contrast localization CT was performed of the pelvis to demonstrate the iliac marrow spaces. Maximal barrier sterile technique utilized including caps, mask, sterile gowns, sterile gloves, large sterile drape, hand hygiene, and Betadine prep. Under sterile conditions and local anesthesia, an 11 gauge coaxial bone biopsy needle was advanced into the right iliac marrow space. Needle position was confirmed with CT imaging. Initially, bone marrow aspiration was performed. Next, the 11 gauge outer cannula was utilized to obtain a right iliac bone marrow core biopsy. Needle was removed. Hemostasis was obtained with compression. The patient tolerated the procedure well. Samples were prepared with the cytotechnologist. No immediate complications. IMPRESSION: CT guided right iliac bone marrow aspiration and core biopsy. Electronically Signed   By: Jerilynn Mages.  Shick M.D.   On: 11/15/2018 11:25   Ir Imaging Guided Port Insertion  Result Date: 11/18/2018 INDICATION: 53 year old female with history lymphoma EXAM: IMPLANTED PORT A CATH PLACEMENT WITH ULTRASOUND AND FLUOROSCOPIC GUIDANCE MEDICATIONS: 2.0 g Ancef; The antibiotic was administered within an appropriate time interval prior to skin puncture. ANESTHESIA/SEDATION: Moderate (conscious) sedation was employed during this procedure. A total of Versed 2.0 mg and Fentanyl 100 mcg was administered intravenously. Moderate Sedation Time: 18 minutes. The patient's level of consciousness and vital signs were monitored continuously by radiology nursing throughout the procedure under my direct supervision. FLUOROSCOPY TIME:  0 minutes, 12 seconds (1.0 mGy) COMPLICATIONS: None PROCEDURE: The  procedure, risks, benefits, and alternatives were explained to the patient. Questions regarding the procedure were encouraged and answered. The patient understands and consents to the procedure. Ultrasound survey was performed with images stored and sent to PACs. The right neck and chest was prepped with chlorhexidine, and draped in the usual sterile fashion using maximum barrier technique (cap and mask, sterile gown, sterile gloves, large sterile sheet, hand hygiene and cutaneous antiseptic). Antibiotic prophylaxis was provided with 2.0g  Ancef administered IV one hour prior to skin incision. Local anesthesia was attained by infiltration with 1% lidocaine without epinephrine. Ultrasound demonstrated patency of the right internal jugular vein, and this was documented with an image. Under real-time ultrasound guidance, this vein was accessed with a 21 gauge micropuncture needle and image documentation was performed. A small dermatotomy was made at the access site with an 11 scalpel. A 0.018" wire was advanced into the SVC and used to estimate the length of the internal catheter. The access needle exchanged for a 36F micropuncture vascular sheath. The 0.018" wire was then removed and a 0.035" wire advanced into the IVC. An appropriate location for the subcutaneous reservoir was selected below the clavicle and an incision was made through the skin and underlying soft tissues. The subcutaneous tissues were then dissected using a combination of blunt and sharp surgical technique and a pocket was formed. A single lumen power injectable portacatheter was then tunneled through the subcutaneous tissues from the pocket to the dermatotomy and the port reservoir placed within the subcutaneous pocket. The venous access site was then serially dilated and a peel away vascular sheath placed over the wire. The wire was removed and the port catheter advanced into position under fluoroscopic guidance. The catheter tip is positioned in  the cavoatrial junction. This was documented with a spot image. The portacatheter was then tested and found to flush and aspirate well. The port was flushed with saline followed by 100 units/mL heparinized saline. The pocket was then closed in two layers using first subdermal inverted interrupted absorbable sutures followed by a running subcuticular suture. The epidermis was then sealed with Dermabond. The dermatotomy at the venous access site was also seal with Dermabond. Patient tolerated the procedure well and remained hemodynamically stable throughout. No complications encountered and no significant blood loss encountered IMPRESSION: Status post right IJ port catheter placement. Catheter ready for use. Signed, Dulcy Fanny. Dellia Nims, RPVI [facility] Electronically Signed   By: Corrie Mckusick D.O.   On: 11/18/2018 11:23    Lab Data:  CBC: Recent Labs  Lab 11/18/18 0354 11/19/18 0540 11/20/18 0500 11/21/18 0619 11/22/18 0609  WBC 4.0 4.5 8.7 18.2* 5.5  HGB 7.2* 6.9* 9.3* 8.4* 6.3*  HCT 23.7* 22.1* 28.8* 26.3* 20.1*  MCV 87.8 86.7 84.0 85.9 87.4  PLT 109* 93* 80* 68* 32*   Basic Metabolic Panel: Recent Labs  Lab 11/16/18 0324 11/18/18 0354 11/19/18 0540 11/20/18 0500 11/21/18 0619 11/22/18 0609  NA 134* 137 136 137 138 138  K 3.9 2.9* 3.3* 4.4 3.5 3.1*  CL 99 105 105 107 108 109  CO2 23 22 21* 19* 18* 20*  GLUCOSE 85 79 103* 120* 99 103*  BUN _0 CREATININE 0.73 0.68 0.63 0.59 0.86 0.69  CALCIUM 7.5* 7.3* 7.2* 7.6* 7.3* 7.1*  MG 2.0  --   --   --   --   --   PHOS 2.4*  --   --   --   --   --    GFR: Estimated Creatinine Clearance: 82 mL/min (by C-G formula based on SCr of 0.69 mg/dL). Liver Function Tests: Recent Labs  Lab 11/19/18 0805  AST 30  ALT 15  ALKPHOS 154*  BILITOT 1.2  PROT 5.1*  ALBUMIN 1.7*   No results for input(s): LIPASE, AMYLASE in the last 168 hours. No results for input(s):  AMMONIA in the last 168 hours. Coagulation Profile:  Recent Labs  Lab 11/18/18 0354  INR 1.65   Cardiac Enzymes: No results for input(s): CKTOTAL, CKMB, CKMBINDEX, TROPONINI in the last 168 hours. BNP (last 3 results) No results for input(s): PROBNP in the last 8760 hours. HbA1C: No results for input(s): HGBA1C in the last 72 hours. CBG: Recent Labs  Lab 11/20/18 2153  GLUCAP 92   Lipid Profile: No results for input(s): CHOL, HDL, LDLCALC, TRIG, CHOLHDL, LDLDIRECT in the last 72 hours. Thyroid Function Tests: No results for input(s): TSH, T4TOTAL, FREET4, T3FREE, THYROIDAB in the last 72 hours. Anemia Panel: No results for input(s): VITAMINB12, FOLATE, FERRITIN, TIBC, IRON, RETICCTPCT in the last 72 hours. Urine analysis:    Component Value Date/Time   COLORURINE YELLOW 11/14/2018 1619   APPEARANCEUR HAZY (A) 11/14/2018 1619   LABSPEC 1.016 11/14/2018 1619   PHURINE 5.0 11/14/2018 1619   GLUCOSEU NEGATIVE 11/14/2018 1619   HGBUR SMALL (A) 11/14/2018 1619   BILIRUBINUR NEGATIVE 11/14/2018 1619   KETONESUR NEGATIVE 11/14/2018 1619   PROTEINUR 30 (A) 11/14/2018 1619   NITRITE NEGATIVE 11/14/2018 1619   LEUKOCYTESUR NEGATIVE 11/14/2018 1619       M.D. [facility] 11/22/2018, 11:37 AM  Pager: [phone] Between 7am to 7pm - call Pager - [phone]  After 7pm go to [email or web address] - password [email or web address]  Call night coverage person covering after 7pm

## 2018-11-23 LAB — BPAM RBC
BLOOD PRODUCT EXPIRATION DATE: 202003182359
BLOOD PRODUCT EXPIRATION DATE: 202003252359
Blood Product Expiration Date: 202003242359
Blood Product Expiration Date: 202003252359
ISSUE DATE / TIME: 202002251034
ISSUE DATE / TIME: 202002281142
ISSUE DATE / TIME: 202002281624
UNIT TYPE AND RH: 600
Unit Type and Rh: 600
Unit Type and Rh: 600
Unit Type and Rh: 600

## 2018-11-23 LAB — TYPE AND SCREEN
ABO/RH(D): AB NEG
Antibody Screen: NEGATIVE
Unit division: 0
Unit division: 0
Unit division: 0
Unit division: 0

## 2018-11-23 LAB — CBC WITH DIFFERENTIAL/PLATELET
Abs Immature Granulocytes: 0.02 10*3/uL (ref 0.00–0.07)
BASOS PCT: 1 %
Basophils Absolute: 0 10*3/uL (ref 0.0–0.1)
Eosinophils Absolute: 0 10*3/uL (ref 0.0–0.5)
Eosinophils Relative: 0 %
HCT: 27.7 % — ABNORMAL LOW (ref 36.0–46.0)
Hemoglobin: 9.2 g/dL — ABNORMAL LOW (ref 12.0–15.0)
IMMATURE GRANULOCYTES: 2 %
Lymphocytes Relative: 13 %
Lymphs Abs: 0.1 10*3/uL — ABNORMAL LOW (ref 0.7–4.0)
MCH: 28.7 pg (ref 26.0–34.0)
MCHC: 33.2 g/dL (ref 30.0–36.0)
MCV: 86.3 fL (ref 80.0–100.0)
Monocytes Absolute: 0 10*3/uL — ABNORMAL LOW (ref 0.1–1.0)
Monocytes Relative: 1 %
NEUTROS PCT: 83 %
Neutro Abs: 0.9 10*3/uL — ABNORMAL LOW (ref 1.7–7.7)
PLATELETS: 26 10*3/uL — AB (ref 150–400)
RBC: 3.21 MIL/uL — ABNORMAL LOW (ref 3.87–5.11)
RDW: 17.2 % — ABNORMAL HIGH (ref 11.5–15.5)
WBC: 1.1 10*3/uL — CL (ref 4.0–10.5)
nRBC: 0 % (ref 0.0–0.2)

## 2018-11-23 LAB — MAGNESIUM: MAGNESIUM: 2 mg/dL (ref 1.7–2.4)

## 2018-11-23 LAB — COMPREHENSIVE METABOLIC PANEL
ALT: 25 U/L (ref 0–44)
AST: 32 U/L (ref 15–41)
Albumin: 1.7 g/dL — ABNORMAL LOW (ref 3.5–5.0)
Alkaline Phosphatase: 126 U/L (ref 38–126)
Anion gap: 13 (ref 5–15)
BUN: 14 mg/dL (ref 6–20)
CHLORIDE: 104 mmol/L (ref 98–111)
CO2: 21 mmol/L — ABNORMAL LOW (ref 22–32)
Calcium: 7.3 mg/dL — ABNORMAL LOW (ref 8.9–10.3)
Creatinine, Ser: 0.51 mg/dL (ref 0.44–1.00)
GFR calc Af Amer: >60 mL/min (ref >60)
GFR calc non Af Amer: >60 mL/min (ref >60)
Glucose, Bld: 91 mg/dL (ref 70–99)
Potassium: 2.7 mmol/L — CL (ref 3.5–5.1)
Sodium: 138 mmol/L (ref 135–145)
Total Bilirubin: 1.2 mg/dL (ref 0.3–1.2)
Total Protein: 4.9 g/dL — ABNORMAL LOW (ref 6.5–8.1)

## 2018-11-23 LAB — POTASSIUM: Potassium: 3.1 mmol/L — ABNORMAL LOW (ref 3.5–5.1)

## 2018-11-23 MED ORDER — POTASSIUM CHLORIDE CRYS ER 20 MEQ PO TBCR
40.0000 meq | EXTENDED_RELEASE_TABLET | ORAL | Status: AC
  Administered 2018-11-23 (×2): 40 meq via ORAL
  Filled 2018-11-23 (×2): qty 2

## 2018-11-23 MED ORDER — LOPERAMIDE HCL 2 MG PO CAPS
4.0000 mg | ORAL_CAPSULE | Freq: Once | ORAL | Status: AC
  Administered 2018-11-23: 4 mg via ORAL
  Filled 2018-11-23: qty 2

## 2018-11-23 NOTE — Progress Notes (Signed)
Cindy Ward   DOB:April 09, 1966   TD#:[id number]   CWC#:[id number]  Hem/Onc follow up note   Subjective: I am covering Dr. Maylon Peppers to see Mrs Cindy Ward.  She is very fatigued, with low appetite.  She remains to be in the bed most of time.  She noticed mild blood in her urine this morning, no other bleeding.  Hemodialysis stable, she has been afebrile since yesterday.   Objective:  Vitals:   11/23/18 0527 11/23/18 1255  BP: 125/82 130/86  Pulse: 75 88  Resp: 16 16  Temp: 97.8 F (36.6 C) 98.3 F (36.8 C)  SpO2: 97% 97%    Body mass index is 25.56 kg/m.  Intake/Output Summary (Last 24 hours) at 11/23/2018 1357 Last data filed at 11/23/2018 1000 Gross per 24 hour  Intake 280 ml  Output 900 ml  Net -620 ml     Sclerae unicteric  Oropharynx clear  No peripheral adenopathy  Lungs clear -- no rales or rhonchi  Heart regular rate and rhythm  Abdomen benign  MSK no focal spinal tenderness, no peripheral edema  Neuro nonfocal  Skin: Small ecchymosis on her both arms.  CBG (last 3)  Recent Labs    11/20/18 2153  GLUCAP 92     Labs:  CBC Latest Ref Rng & Units 11/23/2018 11/22/2018 11/21/2018  WBC 4.0 - 10.5 K/uL 1.1(LL) 5.5 18.2(H)  Hemoglobin 12.0 - 15.0 g/dL 9.2(L) 6.3(LL) 8.4(L)  Hematocrit 36.0 - 46.0 % 27.7(L) 20.1(L) 26.3(L)  Platelets 150 - 400 K/uL 26(LL) 32(L) 68(L)    Urine Studies No results for input(s): UHGB, CRYS in the last 72 hours.  Invalid input(s): UACOL, UAPR, USPG, UPH, UTP, UGL, [place], UBIL, UNIT, UROB, [place], UEPI, UWBC, [place], [place], [place]  Basic Metabolic Panel: Recent Labs  Lab 11/19/18 0540 11/20/18 0500 11/21/18 0619 11/22/18 0609 11/23/18 0610 11/23/18 1000  NA 136 137 138 138 138  --   K 3.3* 4.4 3.5 3.1* 2.7*  --   CL 105 107 108 109 104  --   CO2 21* 19* 18* 20* 21*  --   GLUCOSE 103* 120* 99 103* 91  --   BUN 11 15 19 18 14   --   CREATININE 0.63 0.59 0.86 0.69 0.51  --   CALCIUM 7.2* 7.6* 7.3* 7.1* 7.3*  --   MG  --   --    --   --   --  2.0   GFR Estimated Creatinine Clearance: 82 mL/min (by C-G formula based on SCr of 0.51 mg/dL). Liver Function Tests: Recent Labs  Lab 11/19/18 0805 11/23/18 0610  AST 30 32  ALT 15 25  ALKPHOS 154* 126  BILITOT 1.2 1.2  PROT 5.1* 4.9*  ALBUMIN 1.7* 1.7*   No results for input(s): LIPASE, AMYLASE in the last 168 hours. No results for input(s): AMMONIA in the last 168 hours. Coagulation profile Recent Labs  Lab 11/18/18 0354  INR 1.65    CBC: Recent Labs  Lab 11/19/18 0540 11/20/18 0500 11/21/18 0619 11/22/18 0609 11/23/18 0610  WBC 4.5 8.7 18.2* 5.5 1.1*  NEUTROABS  --   --   --   --  0.9*  HGB 6.9* 9.3* 8.4* 6.3* 9.2*  HCT 22.1* 28.8* 26.3* 20.1* 27.7*  MCV 86.7 84.0 85.9 87.4 86.3  PLT 93* 80* 68* 32* 26*   Cardiac Enzymes: No results for input(s): CKTOTAL, CKMB, CKMBINDEX, TROPONINI in the last 168 hours. BNP: Invalid input(s): POCBNP CBG: Recent Labs  Lab 11/20/18 2153  GLUCAP 92   D-Dimer No results for input(s): DDIMER in the last 72 hours. Hgb A1c No results for input(s): HGBA1C in the last 72 hours. Lipid Profile No results for input(s): CHOL, HDL, LDLCALC, TRIG, CHOLHDL, LDLDIRECT in the last 72 hours. Thyroid function studies No results for input(s): TSH, T4TOTAL, T3FREE, THYROIDAB in the last 72 hours.  Invalid input(s): FREET3 Anemia work up No results for input(s): VITAMINB12, FOLATE, FERRITIN, TIBC, IRON, RETICCTPCT in the last 72 hours. Microbiology Recent Results (from the past 240 hour(s))  Culture, blood (routine x 2)     Status: None   Collection Time: 11/13/18  3:54 PM  Result Value Ref Range Status   Specimen Description   Final    BLOOD RIGHT ANTECUBITAL Performed at [facility] [address] [place], [place] [address]    Special Requests   Final    BOTTLES DRAWN AEROBIC AND ANAEROBIC Blood Culture adequate volume Performed at [facility] [address],  [place], [place] [address]    Culture   Final    NO GROWTH 5 DAYS Performed at [facility], [place] [address] [place], [place] [address]    Report Status 11/18/2018 FINAL  Final  Culture, blood (routine x 2)     Status: None   Collection Time: 11/13/18  4:02 PM  Result Value Ref Range Status   Specimen Description   Final    BLOOD RIGHT FOREARM Performed at [facility], [place] [address], [place], [place] [address]    Special Requests   Final    BOTTLES DRAWN AEROBIC AND ANAEROBIC Blood Culture adequate volume Performed at [facility] [address], [place], [place] [address]    Culture   Final    NO GROWTH 5 DAYS Performed at [facility], [place] [address], [place], [place] [address]    Report Status 11/18/2018 FINAL  Final  Culture, Urine     Status: None   Collection Time: 11/14/18  4:20 PM  Result Value Ref Range Status   Specimen Description   Final    URINE, CLEAN CATCH Performed at [facility], [place] [address] [place], [place] [address]    Special Requests   Final    NONE Performed at [facility], [place] [address] [place], [place] [address]    Culture   Final    NO GROWTH Performed at [facility], [place] [address], [place], [place] [address]    Report Status 11/15/2018 FINAL  Final  Culture, blood (routine x 2)     Status: None (Preliminary result)   Collection Time: 11/21/18  2:51 PM  Result Value Ref Range Status   Specimen Description BLOOD LEFT ANTECUBITAL  Final   Special Requests   Final    BOTTLES DRAWN AEROBIC AND ANAEROBIC Blood Culture adequate volume Performed at [facility] [address], [place], [place] [address]    Culture NO GROWTH < 24 HOURS  Final   Report Status PENDING  Incomplete  Culture, blood (routine x 2)     Status: None (Preliminary result)   Collection Time: 11/21/18  2:59 PM  Result Value Ref Range Status   Specimen Description BLOOD  RIGHT ANTECUBITAL  Final   Special Requests   Final    BOTTLES DRAWN AEROBIC ONLY Blood Culture adequate volume Performed at [facility] [address] [place],  [address]  Culture NO GROWTH < 24 HOURS  Final   Report Status PENDING  Incomplete      Studies:  Dg Chest 2 View  Result Date: 11/21/2018 CLINICAL DATA:  History of lymphoma.  Patient weak and flushed. EXAM: CHEST - 2 VIEW COMPARISON:  Chest CT, 11/18/2018.  Chest radiographs, 11/11/2018. FINDINGS: Mild enlargement of the cardiopericardial silhouette. No mediastinal or hilar masses. No convincing adenopathy. Small bilateral pleural effusions. Mild dependent lower lobe atelectasis. Lungs otherwise clear. No pneumothorax. Right anterior chest wall, internal jugular, Port-A-Cath is stable from the prior chest CT. Skeletal structures are unremarkable. IMPRESSION: 1. Findings are similar to the recent prior chest CT. Enlargement of the cardiopericardial silhouette is consistent with the pericardial effusion noted on CT. There are small bilateral pleural effusions with associated lower lobe atelectasis. No convincing pneumonia and no pulmonary edema. Electronically Signed   By: Lajean Manes M.D.   On: 11/21/2018 15:05    Assessment: 53 y.o. with newly diagnosed Hodgkin lymphoma, stage IV, status post first cycle chemotherapy AVD+ Adcedrison 11/19/2018  1.  Neutropenia secondary to chemotherapy, On Granix daily  2.  Thrombocytopenia, secondary to chemotherapy 3.  Normocytic anemia, secondary to lymphoma and chemotherapy, status post multiple blood transfusion 4.  Hodgkin lymphoma, stage IV 5.  Fever, possible secondary to lymphoma, negative ID work-up 6.  Severe calorie and protein malnutrition 7.  Deconditioning  Plan:  -Continue Granix 480 mcg daily -She had some mild hematuria this morning, if she develops significant bleeding, please consider platelet transfusion, also consider prophylactic  platelet transfusion if platelet <10K  -Due to her low oral intake, I will consider decrease or holding Lasix -I strongly encouraged her to eat and drink more, and try to get out of bed sitting in the chair.  Her husband was at the bedside, also encouraged her to do so. -Continue other supportive care.  We will follow-up closely.   Truitt Merle, MD 11/23/2018  1:57 PM

## 2018-11-23 NOTE — Progress Notes (Addendum)
[facility]                                                                              Patient Demographics  Cindy Ward, is a 53 y.o. female, DOB - 1966-07-02, WTG:[id number]  Admit date - 11/11/2018   Admitting Physician A Melven Sartorius., MD  Outpatient Primary MD for the patient is [name], MD  Outpatient specialists:   LOS - 12  days   Medical records reviewed and are as summarized below:    No chief complaint on file.      Brief summary   53 y.o.femalewith medical history significant ofhypertension hypothyroidism presents at [facility] with recurrent nausea, vomiting, fevers, and night sweats of 9 months duration.She notes that the symptoms occur about every 2 weeks. She has nightly fevers and night sweats. The symptoms occur for days at a time, but have been taking longer and longer to resolve.She came to the hospital today because she passed out at the cancer center. She describes getting dizzy and then hitting the floor. She was seen in the emergency department at [facility]. Her vitals were notable for a fever to 102.7. Multiple enlarged lymph nodes noted on CT abdomen and pelvis. The case was discussed with [name] who recommended transfer to [facility] for further workup as well as LDH, haptoglobin, and retics. 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 Hospital course complicated by recurrent high fevers related to malignancy.   Assessment & Plan    Principal Problem:   Hodgkin lymphoma ([place]) Newly diagnosed, stage IV -Patient presented with persistent fevers, lymphadenopathy, no active infective process -Lymph node biopsies on 2/18 showed classical Hodgkin lymphoma -CT chest abdomen and pelvis, neck showed a diffuse lymphadenopathy involving the cervical, thoracic and abdominal lymph nodes -Port-A-Cath placed on 2/24, started on chemotherapy on 2/25, next cycle  after 2 weeks, -Management per oncology  Fevers -No fevers overnight, feeling overall better, continue Lasix 40 mg daily, I's and O's still 13 L positive. -Discussed with Dr. Megan Salon, infectious disease on 2/28, had last seen the patient on 2/24, patient had extensive work-up during hospitalization for FUO. Dr. Megan Salon recommended treating her symptomatically, NSAIDs and continue management for lymphoma.  From ID standpoint, nothing to add at this point.  Pancytopenia with neutropenia, thrombocytopenia, anemia -Platelets trending down to 26K, Lovenox discontinued -No bleeding, transfuse platelets if less than 10 K -Neutropenia with white count 1.1, placed on neutropenic precautions, receiving Granix x 5 doses, started on 2/26, day #3 today  Severe protein calorie malnutrition in the setting of malignancy -Dietitian following, continue nutritional supplements  Small pericardial effusion 2D echo on 2/18 showed EF of 55 to 60%, small pericardial effusion most prominently along the RV/RA, stable, no tamponade physiology Outpatient follow-up with periodic EKGs  Near syncope Likely secondary to anemia, dehydration, #1  Hypothyroidism Continue Synthroid  Hypokalemia -Replaced  Generalized debility PT evaluation recommended home health PT versus skilled nursing facility   Code Status: Full code DVT Prophylaxis:  SCD's Family Communication: Discussed in detail with the patient, all imaging results, lab results explained to the patient  Disposition Plan: Counts now dropping, follow closely, on Granix  Time Spent in minutes 25 minutes  Procedures:  2D echo  Consultants:   Oncology Infectious disease  Antimicrobials:   Anti-infectives (From admission, onward)   Start     Dose/Rate Route Frequency Ordered Stop   11/18/18 0600  ceFAZolin (ANCEF) IVPB 2g/100 mL premix     2 g 200 mL/hr over 30 Minutes Intravenous To Radiology 11/17/18 1243 11/18/18 1130   11/12/18 0800   cefTRIAXone (ROCEPHIN) 2 g in sodium chloride 0.9 % 100 mL IVPB  Status:  Discontinued     2 g 200 mL/hr over 30 Minutes Intravenous Every 24 hours 11/11/18 1711 11/12/18 1057         Medications  Scheduled Meds: . feeding supplement  1 Container Oral TID BM  . furosemide  40 mg Oral Daily  . levothyroxine  75 mcg Oral Q0600  . mouth rinse  15 mL Mouth Rinse BID  . multivitamin with minerals  1 tablet Oral Daily  . potassium chloride  40 mEq Oral Q4H  . protein supplement  8 oz Oral q12n4p  . scopolamine  1 patch Transdermal Q72H  . Tbo-filgastrim (GRANIX) SQ  480 mcg Subcutaneous q1800   Continuous Infusions: . sodium chloride 250 mL (11/19/18 1356)  . sodium chloride     PRN Meds:.sodium chloride, acetaminophen **OR** acetaminophen, HYDROmorphone (DILAUDID) injection, ibuprofen, iohexol, lip balm, ondansetron **OR** ondansetron (ZOFRAN) IV, oxyCODONE, polyethylene glycol, sodium chloride flush      Subjective:   Cindy Ward was seen and examined today.  No fevers overnight.  Overall starting to improve however still very weak.    Denies any chest pain, abdominal pain, nausea or vomiting or diarrhea.  Objective:   Vitals:   11/22/18 1928 11/22/18 1928 11/22/18 2200 11/23/18 0527  BP: 137/86 137/86 121/82 125/82  Pulse: 63 63 64 75  Resp: '18  18 16  ' Temp: 98.4 F (36.9 C)  98.2 F (36.8 C) 97.8 F (36.6 C)  TempSrc: Oral  Oral Oral  SpO2:  95% 98% 97%  Weight:      Height:        Intake/Output Summary (Last 24 hours) at 11/23/2018 1215 Last data filed at 11/23/2018 1000 Gross per 24 hour  Intake 280 ml  Output 900 ml  Net -620 ml     Wt Readings from Last 3 Encounters:  11/22/18 76.2 kg  06/27/18 78.4 kg  05/14/18 79.4 kg    Physical Exam  General: Alert and oriented x 3, NAD  Eyes:   HEENT:  Atraumatic, normocephalic  Cardiovascular: S1 S2 clear, no murmurs, RRR.  Trace pedal edema b/l  Respiratory: Decreased breath sound at the  bases  Gastrointestinal: Soft, nontender, nondistended, NBS  Ext: Trace pedal edema bilaterally  Neuro: no new deficits  Musculoskeletal: No cyanosis, clubbing  Skin: No rashes  Psych: Flat affect   Data Reviewed:  I have personally reviewed following labs and imaging studies  Micro Results Recent Results (from the past 240 hour(s))  Culture, blood (routine x 2)     Status: None   Collection Time: 11/13/18  3:54 PM  Result Value Ref Range Status   Specimen Description   Final    BLOOD RIGHT ANTECUBITAL Performed at [facility] [address] [place], [place] [address]    Special Requests   Final    BOTTLES DRAWN AEROBIC AND ANAEROBIC Blood Culture adequate volume Performed at [facility]  [address], [place], [place] [address]    Culture   Final    NO GROWTH 5 DAYS Performed at [facility], [place] [address], [place], [place] [address]    Report Status 11/18/2018 FINAL  Final  Culture, blood (routine x 2)     Status: None   Collection Time: 11/13/18  4:02 PM  Result Value Ref Range Status   Specimen Description   Final    BLOOD RIGHT FOREARM Performed at [facility], [place] [address], [place], [place] [address]    Special Requests   Final    BOTTLES DRAWN AEROBIC AND ANAEROBIC Blood Culture adequate volume Performed at [facility] [address], [place], [place] [address]    Culture   Final    NO GROWTH 5 DAYS Performed at [facility], [place] [address], [place], [place] [address]    Report Status 11/18/2018 FINAL  Final  Culture, Urine     Status: None   Collection Time: 11/14/18  4:20 PM  Result Value Ref Range Status   Specimen Description   Final    URINE, CLEAN CATCH Performed at [facility], [place] [address] [place], [place] [address]    Special Requests   Final    NONE Performed at [facility], [place] [address] [place], [place]  [address]    Culture   Final    NO GROWTH Performed at [facility], [place] [address], [place], [place] [address]    Report Status 11/15/2018 FINAL  Final  Culture, blood (routine x 2)     Status: None (Preliminary result)   Collection Time: 11/21/18  2:51 PM  Result Value Ref Range Status   Specimen Description BLOOD LEFT ANTECUBITAL  Final   Special Requests   Final    BOTTLES DRAWN AEROBIC AND ANAEROBIC Blood Culture adequate volume Performed at [place] [address] [place], [place] [address]    Culture NO GROWTH < 24 HOURS  Final   Report Status PENDING  Incomplete  Culture, blood (routine x 2)     Status: None (Preliminary result)   Collection Time: 11/21/18  2:59 PM  Result Value Ref Range Status   Specimen Description BLOOD RIGHT ANTECUBITAL  Final   Special Requests   Final    BOTTLES DRAWN AEROBIC ONLY Blood Culture adequate volume Performed at [place] [facility] [address], [place],  [address]    Culture NO GROWTH < 24 HOURS  Final   Report Status PENDING  Incomplete    Radiology Reports Dg Chest 2 View  Result Date: 11/21/2018 CLINICAL DATA:  History of lymphoma.  Patient weak and flushed. EXAM: CHEST - 2 VIEW COMPARISON:  Chest CT, 11/18/2018.  Chest radiographs, 11/11/2018. FINDINGS: Mild enlargement of the cardiopericardial silhouette. No mediastinal or hilar masses. No convincing adenopathy. Small bilateral pleural effusions. Mild dependent lower lobe atelectasis. Lungs otherwise clear. No pneumothorax. Right anterior chest wall, internal jugular, Port-A-Cath is stable from the prior chest CT. Skeletal structures are unremarkable. IMPRESSION: 1. Findings are similar to the recent prior chest CT. Enlargement of the cardiopericardial silhouette is consistent with the pericardial effusion noted on CT. There are small bilateral pleural effusions with associated lower lobe atelectasis. No convincing pneumonia and no  pulmonary edema. Electronically Signed   By: Lajean Manes M.D.   On: 11/21/2018 15:05   Ct Soft Tissue Neck W Contrast  Result Date: 11/18/2018 CLINICAL  DATA:  Initial evaluation for Hodgkin's lymphoma, initial workup. EXAM: CT NECK WITH CONTRAST CT CHEST, ABDOMEN, AND PELVIS WITH CONTRAST TECHNIQUE: Multidetector CT imaging of the chest, abdomen and pelvis was performed following the standard protocol during bolus administration of intravenous contrast. CONTRAST:  151m OMNIPAQUE IOHEXOL 300 MG/ML SOLN, 375mOMNIPAQUE IOHEXOL 300 MG/ML SOLN COMPARISON:  None. FINDINGS: CT NECK FINDINGS: Pharynx and larynx: Oral cavity within normal limits without mass lesion or loculated collection. Patient is largely edentulous. Calcified tonsilliths noted within the right palatine tonsil. Tonsils themselves symmetric and within normal limits bilaterally. Parapharyngeal fat maintained. Nasopharynx normal. No retropharyngeal collection. Epiglottis normal. Vallecula clear. Remainder of the hypopharynx and supraglottic larynx normal. Glottis within normal limits. Subglottic airway clear. Salivary glands: Parotid and submandibular glands within normal limits. Thyroid: Thyroid diffusely enlarged without discrete nodule or mass. Lymph nodes: Mildly prominent level II lymph nodes measure up to 9 mm in short axis bilaterally. Left level III nodes measure up to 9 mm as well. Increased number of shotty subcentimeter nodes within the neck bilaterally, left slightly worse than right. No other pathologically enlarged lymph nodes identified within the neck. Vascular: Normal intravascular enhancement seen throughout the neck. Right IJ approach central venous catheter in place. Soft tissue swelling with stranding and scattered foci of soft tissue emphysema within the right neck likely related to central line placement. Limited intracranial: Unremarkable Visualized orbits: Partially visualized inferior globes and orbits unremarkable. Mastoids  and visualized paranasal sinuses: Mucosal thickening noted within the visualized maxillary and sphenoid sinuses. Visualized paranasal sinuses are otherwise clear. Visualize mastoids and middle ear cavities are clear. Skeleton: No acute osseous abnormality. No discrete lytic or blastic osseous lesions. Mild cervical spondylolysis present at C5-6. Other: None. CT CHEST FINDINGS Cardiovascular: Normal intravascular enhancement seen throughout the intra-abdominal aorta without aneurysm or other acute abnormality. Minimal atheromatous plaque within the aortic arch. Visualized great vessels within normal limits. Heart size normal. Small moderate pericardial effusion, simple fluid density. Mediastinum/Nodes: Enlarged nodes within the upper mediastinum measure up to 15 mm in short axis (series 3, image 11). No appreciable supraclavicular adenopathy. No other pathologically enlarged mediastinal lymph nodes. Mild soft tissue density within the right hilum without frank adenopathy. Mildly enlarged 11 mm left hilar node (series 3, image 28). Enlarged axillary adenopathy seen bilaterally, with the largest node on the left measuring 2.1 cm in short axis (series 3, image 16). Largest node on the right measures 1.7 cm in short axis (series 3, image 8). Scattered soft tissue stranding with emphysema and fluid density overlying the left axilla likely related to recent lymph node biopsy, partially visualized. Esophagus within normal limits. Lungs/Pleura: Tracheobronchial tree intact and patent. Moderate layering bilateral pleural effusions with associated atelectasis. No other airspace consolidation. No pulmonary edema. No pneumothorax. 4 mm subpleural ground-glass nodule present at the anterior right upper lobe (series 4, image 57), indeterminate. No other worrisome pulmonary nodule or mass. Musculoskeletal: No acute osseous finding. No discrete lytic or blastic osseous lesions. CT ABDOMEN PELVIS FINDINGS Hepatobiliary: Liver  demonstrates a normal contrast enhanced appearance. Gallbladder within normal limits. No biliary dilatation. Pancreas: Pancreas within normal limits. Spleen: Spleen enlarged measuring 14.3 cm in craniocaudad dimension. Few subtle hypodensities noted within the spleen, largest of which measures approximately 12 mm (series 3, image 57), indeterminate. Adrenals/Urinary Tract: Adrenal glands are normal. Kidneys equal in size with symmetric enhancement. No nephrolithiasis, hydronephrosis, or focal enhancing renal mass. No hydroureter. Bladder within normal limits. Stomach/Bowel: Stomach within normal limits. No evidence for bowel  obstruction. Normal appendix. No acute inflammatory changes seen about the bowels. Vascular/Lymphatic: Normal intravascular enhancement seen throughout the intra-abdominal aorta. Mild aorto bi-iliac atherosclerotic disease. No aneurysm. Mesenteric vessels patent proximally. Enlarged 15 mm aortocaval lymph node (series 3, image 7). Additional multifocal shotty subcentimeter aortocaval and periaortic lymph nodes noted. No other pathologically enlarged intra-abdominal lymph nodes identified. Mildly prominent 12 mm left inguinal lymph nodes noted. No other adenopathy within the pelvis. Reproductive: Uterus within normal limits.  Ovaries normal. Other: Moderate volume free fluid within the pelvis, measuring simple fluid density. No free intraperitoneal air. Musculoskeletal: Scattered anasarca noted within the external soft tissues. No acute osseous finding. No discrete lytic or blastic osseous lesions. IMPRESSION: CT NECK IMPRESSION 1. Increased number of shoddy subcentimeter lymph nodes throughout the neck bilaterally, left slightly worse than right. No pathologically enlarged lymph nodes identified. 2. Mild diffuse thyromegaly without discrete thyroid nodule or mass. 3. Mild soft tissue stranding with emphysema within the right lateral neck related to recent right-sided Port-A-Cath placement. CT  CHEST IMPRESSION 1. Enlarged bilateral axillary and upper mediastinal adenopathy as above, likely related to provided history of lymphoma. Additional borderline enlarged left hilar node may be related to lymphoma or possibly reactive in nature. Attention at follow-up recommended. 2. Moderate layering bilateral pleural effusions with associated atelectasis. 3. Small to moderate pericardial effusion. 4. 4 mm right upper lobe ground-glass nodule, indeterminate. Attention at follow-up recommended. CT ABDOMEN AND PELVIS IMPRESSION 1. Enlarged 15 mm aortocaval lymph node with mildly enlarged 12 mm left inguinal lymph nodes. Additional increased number of shotty subcentimeter retroperitoneal lymph nodes. Findings most likely related to history of lymphoma. 2. Splenomegaly. 3. Moderate volume free fluid within the pelvis, of uncertain etiology, but could be physiologic and/or related overall volume status. 4. Mild diffuse anasarca. Electronically Signed   By: Jeannine Boga M.D.   On: 11/18/2018 19:56   Ct Chest W Contrast  Result Date: 11/18/2018 CLINICAL DATA:  Initial evaluation for Hodgkin's lymphoma, initial workup. EXAM: CT NECK WITH CONTRAST CT CHEST, ABDOMEN, AND PELVIS WITH CONTRAST TECHNIQUE: Multidetector CT imaging of the chest, abdomen and pelvis was performed following the standard protocol during bolus administration of intravenous contrast. CONTRAST:  149m OMNIPAQUE IOHEXOL 300 MG/ML SOLN, 368mOMNIPAQUE IOHEXOL 300 MG/ML SOLN COMPARISON:  None. FINDINGS: CT NECK FINDINGS: Pharynx and larynx: Oral cavity within normal limits without mass lesion or loculated collection. Patient is largely edentulous. Calcified tonsilliths noted within the right palatine tonsil. Tonsils themselves symmetric and within normal limits bilaterally. Parapharyngeal fat maintained. Nasopharynx normal. No retropharyngeal collection. Epiglottis normal. Vallecula clear. Remainder of the hypopharynx and supraglottic larynx  normal. Glottis within normal limits. Subglottic airway clear. Salivary glands: Parotid and submandibular glands within normal limits. Thyroid: Thyroid diffusely enlarged without discrete nodule or mass. Lymph nodes: Mildly prominent level II lymph nodes measure up to 9 mm in short axis bilaterally. Left level III nodes measure up to 9 mm as well. Increased number of shotty subcentimeter nodes within the neck bilaterally, left slightly worse than right. No other pathologically enlarged lymph nodes identified within the neck. Vascular: Normal intravascular enhancement seen throughout the neck. Right IJ approach central venous catheter in place. Soft tissue swelling with stranding and scattered foci of soft tissue emphysema within the right neck likely related to central line placement. Limited intracranial: Unremarkable Visualized orbits: Partially visualized inferior globes and orbits unremarkable. Mastoids and visualized paranasal sinuses: Mucosal thickening noted within the visualized maxillary and sphenoid sinuses. Visualized paranasal sinuses are otherwise clear. Visualize  mastoids and middle ear cavities are clear. Skeleton: No acute osseous abnormality. No discrete lytic or blastic osseous lesions. Mild cervical spondylolysis present at C5-6. Other: None. CT CHEST FINDINGS Cardiovascular: Normal intravascular enhancement seen throughout the intra-abdominal aorta without aneurysm or other acute abnormality. Minimal atheromatous plaque within the aortic arch. Visualized great vessels within normal limits. Heart size normal. Small moderate pericardial effusion, simple fluid density. Mediastinum/Nodes: Enlarged nodes within the upper mediastinum measure up to 15 mm in short axis (series 3, image 11). No appreciable supraclavicular adenopathy. No other pathologically enlarged mediastinal lymph nodes. Mild soft tissue density within the right hilum without frank adenopathy. Mildly enlarged 11 mm left hilar node  (series 3, image 28). Enlarged axillary adenopathy seen bilaterally, with the largest node on the left measuring 2.1 cm in short axis (series 3, image 16). Largest node on the right measures 1.7 cm in short axis (series 3, image 8). Scattered soft tissue stranding with emphysema and fluid density overlying the left axilla likely related to recent lymph node biopsy, partially visualized. Esophagus within normal limits. Lungs/Pleura: Tracheobronchial tree intact and patent. Moderate layering bilateral pleural effusions with associated atelectasis. No other airspace consolidation. No pulmonary edema. No pneumothorax. 4 mm subpleural ground-glass nodule present at the anterior right upper lobe (series 4, image 57), indeterminate. No other worrisome pulmonary nodule or mass. Musculoskeletal: No acute osseous finding. No discrete lytic or blastic osseous lesions. CT ABDOMEN PELVIS FINDINGS Hepatobiliary: Liver demonstrates a normal contrast enhanced appearance. Gallbladder within normal limits. No biliary dilatation. Pancreas: Pancreas within normal limits. Spleen: Spleen enlarged measuring 14.3 cm in craniocaudad dimension. Few subtle hypodensities noted within the spleen, largest of which measures approximately 12 mm (series 3, image 57), indeterminate. Adrenals/Urinary Tract: Adrenal glands are normal. Kidneys equal in size with symmetric enhancement. No nephrolithiasis, hydronephrosis, or focal enhancing renal mass. No hydroureter. Bladder within normal limits. Stomach/Bowel: Stomach within normal limits. No evidence for bowel obstruction. Normal appendix. No acute inflammatory changes seen about the bowels. Vascular/Lymphatic: Normal intravascular enhancement seen throughout the intra-abdominal aorta. Mild aorto bi-iliac atherosclerotic disease. No aneurysm. Mesenteric vessels patent proximally. Enlarged 15 mm aortocaval lymph node (series 3, image 7). Additional multifocal shotty subcentimeter aortocaval and  periaortic lymph nodes noted. No other pathologically enlarged intra-abdominal lymph nodes identified. Mildly prominent 12 mm left inguinal lymph nodes noted. No other adenopathy within the pelvis. Reproductive: Uterus within normal limits.  Ovaries normal. Other: Moderate volume free fluid within the pelvis, measuring simple fluid density. No free intraperitoneal air. Musculoskeletal: Scattered anasarca noted within the external soft tissues. No acute osseous finding. No discrete lytic or blastic osseous lesions. IMPRESSION: CT NECK IMPRESSION 1. Increased number of shoddy subcentimeter lymph nodes throughout the neck bilaterally, left slightly worse than right. No pathologically enlarged lymph nodes identified. 2. Mild diffuse thyromegaly without discrete thyroid nodule or mass. 3. Mild soft tissue stranding with emphysema within the right lateral neck related to recent right-sided Port-A-Cath placement. CT CHEST IMPRESSION 1. Enlarged bilateral axillary and upper mediastinal adenopathy as above, likely related to provided history of lymphoma. Additional borderline enlarged left hilar node may be related to lymphoma or possibly reactive in nature. Attention at follow-up recommended. 2. Moderate layering bilateral pleural effusions with associated atelectasis. 3. Small to moderate pericardial effusion. 4. 4 mm right upper lobe ground-glass nodule, indeterminate. Attention at follow-up recommended. CT ABDOMEN AND PELVIS IMPRESSION 1. Enlarged 15 mm aortocaval lymph node with mildly enlarged 12 mm left inguinal lymph nodes. Additional increased number of shotty subcentimeter  retroperitoneal lymph nodes. Findings most likely related to history of lymphoma. 2. Splenomegaly. 3. Moderate volume free fluid within the pelvis, of uncertain etiology, but could be physiologic and/or related overall volume status. 4. Mild diffuse anasarca. Electronically Signed   By: Jeannine Boga M.D.   On: 11/18/2018 19:56   Ct  Abdomen Pelvis W Contrast  Result Date: 11/18/2018 CLINICAL DATA:  Initial evaluation for Hodgkin's lymphoma, initial workup. EXAM: CT NECK WITH CONTRAST CT CHEST, ABDOMEN, AND PELVIS WITH CONTRAST TECHNIQUE: Multidetector CT imaging of the chest, abdomen and pelvis was performed following the standard protocol during bolus administration of intravenous contrast. CONTRAST:  156m OMNIPAQUE IOHEXOL 300 MG/ML SOLN, 381mOMNIPAQUE IOHEXOL 300 MG/ML SOLN COMPARISON:  None. FINDINGS: CT NECK FINDINGS: Pharynx and larynx: Oral cavity within normal limits without mass lesion or loculated collection. Patient is largely edentulous. Calcified tonsilliths noted within the right palatine tonsil. Tonsils themselves symmetric and within normal limits bilaterally. Parapharyngeal fat maintained. Nasopharynx normal. No retropharyngeal collection. Epiglottis normal. Vallecula clear. Remainder of the hypopharynx and supraglottic larynx normal. Glottis within normal limits. Subglottic airway clear. Salivary glands: Parotid and submandibular glands within normal limits. Thyroid: Thyroid diffusely enlarged without discrete nodule or mass. Lymph nodes: Mildly prominent level II lymph nodes measure up to 9 mm in short axis bilaterally. Left level III nodes measure up to 9 mm as well. Increased number of shotty subcentimeter nodes within the neck bilaterally, left slightly worse than right. No other pathologically enlarged lymph nodes identified within the neck. Vascular: Normal intravascular enhancement seen throughout the neck. Right IJ approach central venous catheter in place. Soft tissue swelling with stranding and scattered foci of soft tissue emphysema within the right neck likely related to central line placement. Limited intracranial: Unremarkable Visualized orbits: Partially visualized inferior globes and orbits unremarkable. Mastoids and visualized paranasal sinuses: Mucosal thickening noted within the visualized maxillary and  sphenoid sinuses. Visualized paranasal sinuses are otherwise clear. Visualize mastoids and middle ear cavities are clear. Skeleton: No acute osseous abnormality. No discrete lytic or blastic osseous lesions. Mild cervical spondylolysis present at C5-6. Other: None. CT CHEST FINDINGS Cardiovascular: Normal intravascular enhancement seen throughout the intra-abdominal aorta without aneurysm or other acute abnormality. Minimal atheromatous plaque within the aortic arch. Visualized great vessels within normal limits. Heart size normal. Small moderate pericardial effusion, simple fluid density. Mediastinum/Nodes: Enlarged nodes within the upper mediastinum measure up to 15 mm in short axis (series 3, image 11). No appreciable supraclavicular adenopathy. No other pathologically enlarged mediastinal lymph nodes. Mild soft tissue density within the right hilum without frank adenopathy. Mildly enlarged 11 mm left hilar node (series 3, image 28). Enlarged axillary adenopathy seen bilaterally, with the largest node on the left measuring 2.1 cm in short axis (series 3, image 16). Largest node on the right measures 1.7 cm in short axis (series 3, image 8). Scattered soft tissue stranding with emphysema and fluid density overlying the left axilla likely related to recent lymph node biopsy, partially visualized. Esophagus within normal limits. Lungs/Pleura: Tracheobronchial tree intact and patent. Moderate layering bilateral pleural effusions with associated atelectasis. No other airspace consolidation. No pulmonary edema. No pneumothorax. 4 mm subpleural ground-glass nodule present at the anterior right upper lobe (series 4, image 57), indeterminate. No other worrisome pulmonary nodule or mass. Musculoskeletal: No acute osseous finding. No discrete lytic or blastic osseous lesions. CT ABDOMEN PELVIS FINDINGS Hepatobiliary: Liver demonstrates a normal contrast enhanced appearance. Gallbladder within normal limits. No biliary  dilatation. Pancreas: Pancreas within normal limits.  Spleen: Spleen enlarged measuring 14.3 cm in craniocaudad dimension. Few subtle hypodensities noted within the spleen, largest of which measures approximately 12 mm (series 3, image 57), indeterminate. Adrenals/Urinary Tract: Adrenal glands are normal. Kidneys equal in size with symmetric enhancement. No nephrolithiasis, hydronephrosis, or focal enhancing renal mass. No hydroureter. Bladder within normal limits. Stomach/Bowel: Stomach within normal limits. No evidence for bowel obstruction. Normal appendix. No acute inflammatory changes seen about the bowels. Vascular/Lymphatic: Normal intravascular enhancement seen throughout the intra-abdominal aorta. Mild aorto bi-iliac atherosclerotic disease. No aneurysm. Mesenteric vessels patent proximally. Enlarged 15 mm aortocaval lymph node (series 3, image 7). Additional multifocal shotty subcentimeter aortocaval and periaortic lymph nodes noted. No other pathologically enlarged intra-abdominal lymph nodes identified. Mildly prominent 12 mm left inguinal lymph nodes noted. No other adenopathy within the pelvis. Reproductive: Uterus within normal limits.  Ovaries normal. Other: Moderate volume free fluid within the pelvis, measuring simple fluid density. No free intraperitoneal air. Musculoskeletal: Scattered anasarca noted within the external soft tissues. No acute osseous finding. No discrete lytic or blastic osseous lesions. IMPRESSION: CT NECK IMPRESSION 1. Increased number of shoddy subcentimeter lymph nodes throughout the neck bilaterally, left slightly worse than right. No pathologically enlarged lymph nodes identified. 2. Mild diffuse thyromegaly without discrete thyroid nodule or mass. 3. Mild soft tissue stranding with emphysema within the right lateral neck related to recent right-sided Port-A-Cath placement. CT CHEST IMPRESSION 1. Enlarged bilateral axillary and upper mediastinal adenopathy as above, likely  related to provided history of lymphoma. Additional borderline enlarged left hilar node may be related to lymphoma or possibly reactive in nature. Attention at follow-up recommended. 2. Moderate layering bilateral pleural effusions with associated atelectasis. 3. Small to moderate pericardial effusion. 4. 4 mm right upper lobe ground-glass nodule, indeterminate. Attention at follow-up recommended. CT ABDOMEN AND PELVIS IMPRESSION 1. Enlarged 15 mm aortocaval lymph node with mildly enlarged 12 mm left inguinal lymph nodes. Additional increased number of shotty subcentimeter retroperitoneal lymph nodes. Findings most likely related to history of lymphoma. 2. Splenomegaly. 3. Moderate volume free fluid within the pelvis, of uncertain etiology, but could be physiologic and/or related overall volume status. 4. Mild diffuse anasarca. Electronically Signed   By: Jeannine Boga M.D.   On: 11/18/2018 19:56   Ct Biopsy  Result Date: 11/15/2018 INDICATION: Pancytopenia, concern for lymphoproliferative process EXAM: CT GUIDED RIGHT ILIAC BONE MARROW ASPIRATION AND CORE BIOPSY Date:  11/15/2018 11/15/2018 10:13 am Radiologist:  M. Daryll Brod, MD Guidance:  CT FLUOROSCOPY TIME:  Fluoroscopy Time: None. MEDICATIONS: 1% lidocaine local ANESTHESIA/SEDATION: 2.0 mg IV Versed; 50 mcg IV Fentanyl Moderate Sedation Time:  10 minutes The patient was continuously monitored during the procedure by the interventional radiology nurse under my direct supervision. CONTRAST:  None. COMPLICATIONS: None PROCEDURE: Informed consent was obtained from the patient following explanation of the procedure, risks, benefits and alternatives. The patient understands, agrees and consents for the procedure. All questions were addressed. A time out was performed. The patient was positioned prone and non-contrast localization CT was performed of the pelvis to demonstrate the iliac marrow spaces. Maximal barrier sterile technique utilized including  caps, mask, sterile gowns, sterile gloves, large sterile drape, hand hygiene, and Betadine prep. Under sterile conditions and local anesthesia, an 11 gauge coaxial bone biopsy needle was advanced into the right iliac marrow space. Needle position was confirmed with CT imaging. Initially, bone marrow aspiration was performed. Next, the 11 gauge outer cannula was utilized to obtain a right iliac bone marrow core biopsy. Needle was  removed. Hemostasis was obtained with compression. The patient tolerated the procedure well. Samples were prepared with the cytotechnologist. No immediate complications. IMPRESSION: CT guided right iliac bone marrow aspiration and core biopsy. Electronically Signed   By: Jerilynn Mages.  Shick M.D.   On: 11/15/2018 11:25   Dg Chest Port 1 View  Result Date: 11/11/2018 CLINICAL DATA:  Fever EXAM: PORTABLE CHEST 1 VIEW COMPARISON:  None. FINDINGS: Mild cardiomegaly. No focal consolidation or effusion. No pneumothorax. IMPRESSION: No active disease. Electronically Signed   By: Donavan Foil M.D.   On: 11/11/2018 21:59   Ct Bone Marrow Biopsy & Aspiration  Result Date: 11/15/2018 INDICATION: Pancytopenia, concern for lymphoproliferative process EXAM: CT GUIDED RIGHT ILIAC BONE MARROW ASPIRATION AND CORE BIOPSY Date:  11/15/2018 11/15/2018 10:13 am Radiologist:  M. Daryll Brod, MD Guidance:  CT FLUOROSCOPY TIME:  Fluoroscopy Time: None. MEDICATIONS: 1% lidocaine local ANESTHESIA/SEDATION: 2.0 mg IV Versed; 50 mcg IV Fentanyl Moderate Sedation Time:  10 minutes The patient was continuously monitored during the procedure by the interventional radiology nurse under my direct supervision. CONTRAST:  None. COMPLICATIONS: None PROCEDURE: Informed consent was obtained from the patient following explanation of the procedure, risks, benefits and alternatives. The patient understands, agrees and consents for the procedure. All questions were addressed. A time out was performed. The patient was positioned prone  and non-contrast localization CT was performed of the pelvis to demonstrate the iliac marrow spaces. Maximal barrier sterile technique utilized including caps, mask, sterile gowns, sterile gloves, large sterile drape, hand hygiene, and Betadine prep. Under sterile conditions and local anesthesia, an 11 gauge coaxial bone biopsy needle was advanced into the right iliac marrow space. Needle position was confirmed with CT imaging. Initially, bone marrow aspiration was performed. Next, the 11 gauge outer cannula was utilized to obtain a right iliac bone marrow core biopsy. Needle was removed. Hemostasis was obtained with compression. The patient tolerated the procedure well. Samples were prepared with the cytotechnologist. No immediate complications. IMPRESSION: CT guided right iliac bone marrow aspiration and core biopsy. Electronically Signed   By: Jerilynn Mages.  Shick M.D.   On: 11/15/2018 11:25   Ir Imaging Guided Port Insertion  Result Date: 11/18/2018 INDICATION: 53 year old female with history lymphoma EXAM: IMPLANTED PORT A CATH PLACEMENT WITH ULTRASOUND AND FLUOROSCOPIC GUIDANCE MEDICATIONS: 2.0 g Ancef; The antibiotic was administered within an appropriate time interval prior to skin puncture. ANESTHESIA/SEDATION: Moderate (conscious) sedation was employed during this procedure. A total of Versed 2.0 mg and Fentanyl 100 mcg was administered intravenously. Moderate Sedation Time: 18 minutes. The patient's level of consciousness and vital signs were monitored continuously by radiology nursing throughout the procedure under my direct supervision. FLUOROSCOPY TIME:  0 minutes, 12 seconds (1.0 mGy) COMPLICATIONS: None PROCEDURE: The procedure, risks, benefits, and alternatives were explained to the patient. Questions regarding the procedure were encouraged and answered. The patient understands and consents to the procedure. Ultrasound survey was performed with images stored and sent to PACs. The right neck and chest was  prepped with chlorhexidine, and draped in the usual sterile fashion using maximum barrier technique (cap and mask, sterile gown, sterile gloves, large sterile sheet, hand hygiene and cutaneous antiseptic). Antibiotic prophylaxis was provided with 2.0g Ancef administered IV one hour prior to skin incision. Local anesthesia was attained by infiltration with 1% lidocaine without epinephrine. Ultrasound demonstrated patency of the right internal jugular vein, and this was documented with an image. Under real-time ultrasound guidance, this vein was accessed with a 21 gauge micropuncture needle and image  documentation was performed. A small dermatotomy was made at the access site with an 11 scalpel. A 0.018" wire was advanced into the SVC and used to estimate the length of the internal catheter. The access needle exchanged for a 14F micropuncture vascular sheath. The 0.018" wire was then removed and a 0.035" wire advanced into the IVC. An appropriate location for the subcutaneous reservoir was selected below the clavicle and an incision was made through the skin and underlying soft tissues. The subcutaneous tissues were then dissected using a combination of blunt and sharp surgical technique and a pocket was formed. A single lumen power injectable portacatheter was then tunneled through the subcutaneous tissues from the pocket to the dermatotomy and the port reservoir placed within the subcutaneous pocket. The venous access site was then serially dilated and a peel away vascular sheath placed over the wire. The wire was removed and the port catheter advanced into position under fluoroscopic guidance. The catheter tip is positioned in the cavoatrial junction. This was documented with a spot image. The portacatheter was then tested and found to flush and aspirate well. The port was flushed with saline followed by 100 units/mL heparinized saline. The pocket was then closed in two layers using first subdermal inverted  interrupted absorbable sutures followed by a running subcuticular suture. The epidermis was then sealed with Dermabond. The dermatotomy at the venous access site was also seal with Dermabond. Patient tolerated the procedure well and remained hemodynamically stable throughout. No complications encountered and no significant blood loss encountered IMPRESSION: Status post right IJ port catheter placement. Catheter ready for use. Signed, Dulcy Fanny. Dellia Nims, RPVI [facility] Radiology Electronically Signed   By: Corrie Mckusick D.O.   On: 11/18/2018 11:23    Lab Data:  CBC: Recent Labs  Lab 11/19/18 0540 11/20/18 0500 11/21/18 0619 11/22/18 0609 11/23/18 0610  WBC 4.5 8.7 18.2* 5.5 1.1*  NEUTROABS  --   --   --   --  0.9*  HGB 6.9* 9.3* 8.4* 6.3* 9.2*  HCT 22.1* 28.8* 26.3* 20.1* 27.7*  MCV 86.7 84.0 85.9 87.4 86.3  PLT 93* 80* 68* 32* 26*   Basic Metabolic Panel: Recent Labs  Lab 11/19/18 0540 11/20/18 0500 11/21/18 0619 11/22/18 0609 11/23/18 0610 11/23/18 1000  NA 136 137 138 138 138  --   K 3.3* 4.4 3.5 3.1* 2.7*  --   CL 105 107 108 109 104  --   CO2 21* 19* 18* 20* 21*  --   GLUCOSE 103* 120* 99 103* 91  --   BUN '11 15 19 18 14  ' --   CREATININE 0.63 0.59 0.86 0.69 0.51  --   CALCIUM 7.2* 7.6* 7.3* 7.1* 7.3*  --   MG  --   --   --   --   --  2.0   GFR: Estimated Creatinine Clearance: 82 mL/min (by C-G formula based on SCr of 0.51 mg/dL). Liver Function Tests: Recent Labs  Lab 11/19/18 0805 11/23/18 0610  AST 30 32  ALT 15 25  ALKPHOS 154* 126  BILITOT 1.2 1.2  PROT 5.1* 4.9*  ALBUMIN 1.7* 1.7*   No results for input(s): LIPASE, AMYLASE in the last 168 hours. No results for input(s): AMMONIA in the last 168 hours. Coagulation Profile: Recent Labs  Lab 11/18/18 0354  INR 1.65   Cardiac Enzymes: No results for input(s): CKTOTAL, CKMB, CKMBINDEX, TROPONINI in the last 168 hours. BNP (last 3 results) No results  for input(s): PROBNP in the last 8760 hours. HbA1C: No results for input(s): HGBA1C in the last 72 hours. CBG: Recent Labs  Lab 11/20/18 2153  GLUCAP 92   Lipid Profile: No results for input(s): CHOL, HDL, LDLCALC, TRIG, CHOLHDL, LDLDIRECT in the last 72 hours. Thyroid Function Tests: No results for input(s): TSH, T4TOTAL, FREET4, T3FREE, THYROIDAB in the last 72 hours. Anemia Panel: No results for input(s): VITAMINB12, FOLATE, FERRITIN, TIBC, IRON, RETICCTPCT in the last 72 hours. Urine analysis:    Component Value Date/Time   COLORURINE YELLOW 11/14/2018 1619   APPEARANCEUR HAZY (A) 11/14/2018 1619   LABSPEC 1.016 11/14/2018 1619   PHURINE 5.0 11/14/2018 1619   GLUCOSEU NEGATIVE 11/14/2018 1619   HGBUR SMALL (A) 11/14/2018 1619   BILIRUBINUR NEGATIVE 11/14/2018 1619   KETONESUR NEGATIVE 11/14/2018 1619   PROTEINUR 30 (A) 11/14/2018 1619   NITRITE NEGATIVE 11/14/2018 1619   LEUKOCYTESUR NEGATIVE 11/14/2018 1619     Isobel Eisenhuth M.D. [facility] 11/23/2018, 12:15 PM  Pager: [phone] Between 7am to 7pm - call Pager - [phone]  After 7pm go to [email or web address] - password [email or web address]  Call night coverage person covering after 7pm

## 2018-11-23 NOTE — Progress Notes (Signed)
CRITICAL VALUE ALERT  Critical Value:  PLT 26 WBC 1.1 K 2.7  Date & Time Notied:  11/23/2018   Provider Notified: Dr Tana Coast  Orders Received/Actions taken:

## 2018-11-23 NOTE — Progress Notes (Signed)
Physical Therapy Treatment Patient Details Name: Cindy Ward MRN: [id number] DOB: 15-May-1966 Today's Date: 11/23/2018    History of Present Illness 53 yo female admitted to ED on 11/11/18 for 9 month history of N/V, fatigue, and weight loss. Pt previously followed in [place], oncology reports inconclusive findings. 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 Pt with R iliac BM aspiration and core, awaiting results. Other PMH includes HTN, hypothyroidism.    PT Comments    Pt continues to be extremely weak and  Deconditioned; she was dizzy with amb x 15' in room and requiring min assist for balance; she is at risk for falls. Pt would benefit from SNF however she is reluctantly agreeable and also is medicade pending therefore will be a difficult placement;  discussed increasing home support--=->pt states that her sister in law cleans house  for her, says her brother can stay with her and also her dtr can assist; If home recommend HHPT/HHOT and supervision for mobility;  Will follow in acute setting  Follow Up Recommendations  Supervision for mobility/OOB;SNF     Equipment Recommendations  None recommended by PT    Recommendations for Other Services       Precautions / Restrictions Precautions Precautions: Fall Restrictions Weight Bearing Restrictions: No LUE Weight Bearing: (no lifting over 10lbs for 2-3wks biopsy extremity)    Mobility  Bed Mobility   Bed Mobility: Supine to Sit     Supine to sit: HOB elevated;Min guard     General bed mobility comments: incr time and effort to transition to sit, HOB @ 30*  Transfers Overall transfer level: Needs assistance Equipment used: None Transfers: Sit to/from Stand Sit to Stand: Min assist;Min guard         General transfer comment: min to min/guard for safety on rising, pt is unsteady and requires support to steady once in standing  Ambulation/Gait Ambulation/Gait  assistance: Min assist Gait Distance (Feet): 15 Feet Assistive device: None Gait Pattern/deviations: Step-through pattern;Decreased stride length Gait velocity: decr   General Gait Details: Min assist for steadying, LOB x1 in above distance; pt declines use of RW,  limited by fatigue, dizziness and LE weakness    Stairs             Wheelchair Mobility    Modified Ward (Stroke Patients Only)       Balance     Sitting balance-Leahy Scale: Good     Standing balance support: No upper extremity supported;During functional activity Standing balance-Leahy Scale: Poor Standing balance comment: LOB recovered with PT assist                            Cognition Arousal/Alertness: Awake/alert Behavior During Therapy: Flat affect;WFL for tasks assessed/performed                           Following Commands: Follows one step commands consistently Safety/Judgement: Decreased awareness of safety;Decreased awareness of deficits   Problem Solving: Slow processing;Difficulty sequencing;Requires verbal cues        Exercises      General Comments        Pertinent Vitals/Pain Pain Assessment: No/denies pain    Home Living                      Prior Function  PT Goals (current goals can now be found in the care plan section) Acute Rehab PT Goals Patient Stated Goal: feel stronger  PT Goal Formulation: With patient Time For Goal Achievement: 12/03/18 Potential to Achieve Goals: Good Progress towards PT goals: Progressing toward goals    Frequency    Min 2X/week(may need to incr to 3x/wk if home)      PT Plan Current plan remains appropriate    Co-evaluation              AM-PAC PT "6 Clicks" Mobility   Outcome Measure  Help needed turning from your back to your side while in a flat bed without using bedrails?: A Little Help needed moving from lying on your back to sitting on the side of a flat bed without  using bedrails?: A Little Help needed moving to and from a bed to a chair (including a wheelchair)?: A Little Help needed standing up from a chair using your arms (e.g., wheelchair or bedside chair)?: A Little Help needed to walk in hospital room?: A Lot Help needed climbing 3-5 steps with a railing? : A Lot 6 Click Score: 16    End of Session Equipment Utilized During Treatment: Gait belt Activity Tolerance: Patient limited by fatigue Patient left: in chair;with call bell/phone within reach Nurse Communication: Mobility status PT Visit Diagnosis: Other abnormalities of gait and mobility (R26.89);Difficulty in walking, not elsewhere classified (R26.2);Muscle weakness (generalized) (M62.81)     Time: 7588-3254 PT Time Calculation (min) (ACUTE ONLY): 21 min  Charges:  $Gait Training: 8-22 mins                     Kenyon Ana, PT  Pager: [phone] Acute Rehab Dept [facility]): [phone]   11/23/2018    [facility] 11/23/2018, 10:09 AM

## 2018-11-24 DIAGNOSIS — E063 Autoimmune thyroiditis: Secondary | ICD-10-CM

## 2018-11-24 LAB — CBC WITH DIFFERENTIAL/PLATELET
Abs Immature Granulocytes: 0.01 10*3/uL (ref 0.00–0.07)
Basophils Absolute: 0 10*3/uL (ref 0.0–0.1)
Basophils Relative: 0 %
Eosinophils Absolute: 0 10*3/uL (ref 0.0–0.5)
Eosinophils Relative: 0 %
HCT: 29.6 % — ABNORMAL LOW (ref 36.0–46.0)
Hemoglobin: 9.4 g/dL — ABNORMAL LOW (ref 12.0–15.0)
Immature Granulocytes: 2 %
Lymphocytes Relative: 46 %
Lymphs Abs: 0.2 10*3/uL — ABNORMAL LOW (ref 0.7–4.0)
MCH: 28.2 pg (ref 26.0–34.0)
MCHC: 31.8 g/dL (ref 30.0–36.0)
MCV: 88.9 fL (ref 80.0–100.0)
Monocytes Absolute: 0 10*3/uL — ABNORMAL LOW (ref 0.1–1.0)
Monocytes Relative: 6 %
Neutro Abs: 0.2 10*3/uL — ABNORMAL LOW (ref 1.7–7.7)
Neutrophils Relative %: 46 %
Platelets: 28 10*3/uL — CL (ref 150–400)
RBC: 3.33 MIL/uL — ABNORMAL LOW (ref 3.87–5.11)
RDW: 17.9 % — ABNORMAL HIGH (ref 11.5–15.5)
WBC: 0.5 10*3/uL — CL (ref 4.0–10.5)
nRBC: 0 % (ref 0.0–0.2)

## 2018-11-24 LAB — BASIC METABOLIC PANEL
Anion gap: 20 — ABNORMAL HIGH (ref 5–15)
BUN: 6 mg/dL (ref 6–20)
CO2: 15 mmol/L — ABNORMAL LOW (ref 22–32)
Calcium: 7.5 mg/dL — ABNORMAL LOW (ref 8.9–10.3)
Chloride: 105 mmol/L (ref 98–111)
Creatinine, Ser: 0.67 mg/dL (ref 0.44–1.00)
GFR calc Af Amer: >60 mL/min (ref >60)
GFR calc non Af Amer: >60 mL/min (ref >60)
Glucose, Bld: 80 mg/dL (ref 70–99)
Potassium: 2.8 mmol/L — ABNORMAL LOW (ref 3.5–5.1)
Sodium: 140 mmol/L (ref 135–145)

## 2018-11-24 MED ORDER — POTASSIUM CHLORIDE CRYS ER 20 MEQ PO TBCR
40.0000 meq | EXTENDED_RELEASE_TABLET | Freq: Every day | ORAL | Status: DC
  Administered 2018-11-24: 40 meq via ORAL
  Filled 2018-11-24: qty 2

## 2018-11-24 MED ORDER — POTASSIUM CHLORIDE 10 MEQ/100ML IV SOLN
10.0000 meq | INTRAVENOUS | Status: AC
  Administered 2018-11-24 (×3): 10 meq via INTRAVENOUS
  Filled 2018-11-24: qty 100

## 2018-11-24 MED ORDER — FLUCONAZOLE 100 MG PO TABS
200.0000 mg | ORAL_TABLET | Freq: Every day | ORAL | Status: DC
  Administered 2018-11-24: 200 mg via ORAL
  Filled 2018-11-24: qty 2

## 2018-11-24 MED ORDER — MAGIC MOUTHWASH W/LIDOCAINE
5.0000 mL | Freq: Four times a day (QID) | ORAL | Status: DC
  Administered 2018-11-24 (×3): 5 mL via ORAL
  Filled 2018-11-24 (×5): qty 5

## 2018-11-24 NOTE — Progress Notes (Signed)
[facility]                                                                              Patient Demographics  Cindy Ward, is a 53 y.o. female, DOB - 1966/02/19, HYQ:[id number]  Admit date - 11/11/2018   Admitting Physician A Melven Sartorius., MD  Outpatient Primary MD for the patient is [name], MD  Outpatient specialists:   LOS - 13  days   Medical records reviewed and are as summarized below:    No chief complaint on file.      Brief summary   53 y.o.femalewith medical history significant ofhypertension hypothyroidism presents at [facility] with recurrent nausea, vomiting, fevers, and night sweats of 9 months duration.She notes that the symptoms occur about every 2 weeks. She has nightly fevers and night sweats. The symptoms occur for days at a time, but have been taking longer and longer to resolve.She came to the hospital today because she passed out at the cancer center. She describes getting dizzy and then hitting the floor. She was seen in the emergency department at [facility] Her vitals were notable for a fever to 102.7. Multiple enlarged lymph nodes noted on CT abdomen and pelvis. The case was discussed with [name] who recommended transfer to [facility] for further workup as well as LDH, haptoglobin, and retics. 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 Hospital course complicated by recurrent high fevers related to malignancy.   Assessment & Plan    Principal Problem:   Hodgkin lymphoma ([place]) Newly diagnosed, stage IV -Patient presented with persistent fevers, lymphadenopathy, no active infective process -Lymph node biopsies on 2/18 showed classical Hodgkin lymphoma -CT chest abdomen and pelvis, neck showed a diffuse lymphadenopathy involving the cervical, thoracic and abdominal lymph nodes -Port-A-Cath placed on 2/24, started on chemotherapy on 2/25, next cycle  after 2 weeks, -Management per oncology  Fevers -Resolved -Discussed with Dr. Megan Salon, infectious disease on 2/28, had last seen the patient on 2/24, patient had extensive work-up during hospitalization for FUO. Dr. Megan Salon recommended treating her symptomatically, NSAIDs and continue management for lymphoma.  From ID standpoint, nothing to add at this point.  Pancytopenia with neutropenia, thrombocytopenia, anemia -Platelets starting to trend up 28K from 26K yesterday -No bleeding, transfuse platelets if less than 10 K -WBC count trending down, 0.5, continue neutropenic precautions, receiving Granix x 5 doses, started on 2/26, day #4 today -Oncology following  Hypokalemia Placed on daily replacement with extra IV replacement today  Severe protein calorie malnutrition in the setting of malignancy -Dietitian following, continue nutritional supplements  Small pericardial effusion 2D echo on 2/18 showed EF of 55 to 60%, small pericardial effusion most prominently along the RV/RA, stable, no tamponade physiology Outpatient follow-up with periodic EKGs  Near syncope Likely secondary to anemia, dehydration, #1  Hypothyroidism Continue Synthroid  Generalized debility PT evaluation recommended home health PT versus skilled nursing facility, patient prefers home health PT although does not have 24/7 supervision   Code Status: Full code DVT Prophylaxis:  SCD's Family Communication: Discussed in detail with the patient, all imaging results, lab results explained  to the patient   Disposition Plan: Counts now dropping, follow closely, on Granix  Time Spent in minutes 25 minutes  Procedures:  2D echo  Consultants:   Oncology Infectious disease  Antimicrobials:   Anti-infectives (From admission, onward)   Start     Dose/Rate Route Frequency Ordered Stop   11/18/18 0600  ceFAZolin (ANCEF) IVPB 2g/100 mL premix     2 g 200 mL/hr over 30 Minutes Intravenous To Radiology  11/17/18 1243 11/18/18 1130   11/12/18 0800  cefTRIAXone (ROCEPHIN) 2 g in sodium chloride 0.9 % 100 mL IVPB  Status:  Discontinued     2 g 200 mL/hr over 30 Minutes Intravenous Every 24 hours 11/11/18 1711 11/12/18 1057         Medications  Scheduled Meds: . feeding supplement  1 Container Oral TID BM  . levothyroxine  75 mcg Oral Q0600  . magic mouthwash w/lidocaine  5 mL Oral QID  . mouth rinse  15 mL Mouth Rinse BID  . multivitamin with minerals  1 tablet Oral Daily  . potassium chloride  40 mEq Oral Daily  . protein supplement  8 oz Oral q12n4p  . scopolamine  1 patch Transdermal Q72H  . Tbo-filgastrim (GRANIX) SQ  480 mcg Subcutaneous q1800   Continuous Infusions: . sodium chloride 250 mL (11/19/18 1356)  . sodium chloride    . potassium chloride     PRN Meds:.sodium chloride, acetaminophen **OR** acetaminophen, HYDROmorphone (DILAUDID) injection, ibuprofen, iohexol, lip balm, ondansetron **OR** ondansetron (ZOFRAN) IV, oxyCODONE, polyethylene glycol, sodium chloride flush      Subjective:   [name] was seen and examined today.  Overnight no fevers.  Eating breakfast at the time of my examination.  Hopefully wants to go home when ready.  Still very weak.  Denies any chest pain, abdominal pain, nausea or vomiting or diarrhea.  Objective:   Vitals:   11/23/18 0527 11/23/18 1255 11/23/18 2104 11/24/18 0627  BP: 125/82 130/86 134/81 136/85  Pulse: 75 88 94 95  Resp: '16 16 19 18  ' Temp: 97.8 F (36.6 C) 98.3 F (36.8 C) 98.4 F (36.9 C) 98.7 F (37.1 C)  TempSrc: Oral Oral Oral Oral  SpO2: 97% 97% 97% 97%  Weight:    74.8 kg  Height:        Intake/Output Summary (Last 24 hours) at 11/24/2018 1148 Last data filed at 11/23/2018 1500 Gross per 24 hour  Intake 240 ml  Output 250 ml  Net -10 ml     Wt Readings from Last 3 Encounters:  11/24/18 74.8 kg  06/27/18 78.4 kg  05/14/18 79.4 kg   Physical Exam  General: Alert and oriented x 3, NAD,  ill-appearing  Eyes:   HEENT:    Cardiovascular: S1 S2 clear, no murmurs, RRR.   Respiratory: decreased breath sound at the bases  Gastrointestinal: Soft, nontender, nondistended, NBS  Ext:1+pedal edema bilaterally  Neuro: no new deficits  Musculoskeletal: No cyanosis, clubbing  Skin: No rashes  Psych: Normal affect and demeanor, alert and oriented x3      Data Reviewed:  I have personally reviewed following labs and imaging studies  Micro Results Recent Results (from the past 240 hour(s))  Culture, Urine     Status: None   Collection Time: 11/14/18  4:20 PM  Result Value Ref Range Status   Specimen Description   Final    URINE, CLEAN CATCH Performed at [facility] [address], [place], [place] [address]  Special Requests   Final    NONE Performed at [facility], [place] [address] [place], [place] [address]    Culture   Final    NO GROWTH Performed at [facility], [place] [address] [place], [place] [address]    Report Status 11/15/2018 FINAL  Final  Culture, blood (routine x 2)     Status: None (Preliminary result)   Collection Time: 11/21/18  2:51 PM  Result Value Ref Range Status   Specimen Description   Final    BLOOD LEFT ANTECUBITAL Performed at [facility] [address] [place], [place] [address]    Special Requests   Final    BOTTLES DRAWN AEROBIC AND ANAEROBIC Blood Culture adequate volume Performed at [facility] [address], [place], [place] [address]    Culture   Final    NO GROWTH 2 DAYS Performed at [facility] [address], [place], [place] [address]    Report Status PENDING  Incomplete  Culture, blood (routine x 2)     Status: None (Preliminary result)   Collection Time: 11/21/18  2:59 PM  Result Value Ref Range Status   Specimen Description   Final    BLOOD RIGHT ANTECUBITAL Performed at [facility]  [address] [place], [place] [address]    Special Requests   Final    BOTTLES DRAWN AEROBIC ONLY Blood Culture adequate volume Performed at [place], [place] [address], [place], [place] [address]    Culture   Final    NO GROWTH 2 DAYS Performed at [facility] [address], [place], [place] [address]    Report Status PENDING  Incomplete    Radiology Reports Dg Chest 2 View  Result Date: 11/21/2018 CLINICAL DATA:  History of lymphoma.  Patient weak and flushed. EXAM: CHEST - 2 VIEW COMPARISON:  Chest CT, 11/18/2018.  Chest radiographs, 11/11/2018. FINDINGS: Mild enlargement of the cardiopericardial silhouette. No mediastinal or hilar masses. No convincing adenopathy. Small bilateral pleural effusions. Mild dependent lower lobe atelectasis. Lungs otherwise clear. No pneumothorax. Right anterior chest wall, internal jugular, Port-A-Cath is stable from the prior chest CT. Skeletal structures are unremarkable. IMPRESSION: 1. Findings are similar to the recent prior chest CT. Enlargement of the cardiopericardial silhouette is consistent with the pericardial effusion noted on CT. There are small bilateral pleural effusions with associated lower lobe atelectasis. No convincing pneumonia and no pulmonary edema. Electronically Signed   By: Lajean Manes M.D.   On: 11/21/2018 15:05   Ct Soft Tissue Neck W Contrast  Result Date: 11/18/2018 CLINICAL DATA:  Initial evaluation for Hodgkin's lymphoma, initial workup. EXAM: CT NECK WITH CONTRAST CT CHEST, ABDOMEN, AND PELVIS WITH CONTRAST TECHNIQUE: Multidetector CT imaging of the chest, abdomen and pelvis was performed following the standard protocol during bolus administration of intravenous contrast. CONTRAST:  14m OMNIPAQUE IOHEXOL 300 MG/ML SOLN, 314mOMNIPAQUE IOHEXOL 300 MG/ML SOLN COMPARISON:  None. FINDINGS: CT NECK FINDINGS: Pharynx and larynx: Oral cavity within normal limits without mass lesion or loculated collection.  Patient is largely edentulous. Calcified tonsilliths noted within the right palatine tonsil. Tonsils themselves symmetric and within normal limits bilaterally. Parapharyngeal fat maintained. Nasopharynx normal. No retropharyngeal collection. Epiglottis normal. Vallecula clear. Remainder of the hypopharynx and supraglottic larynx normal. Glottis within normal limits. Subglottic airway clear. Salivary glands: Parotid and submandibular glands within normal limits. Thyroid: Thyroid diffusely enlarged without discrete nodule or mass. Lymph  nodes: Mildly prominent level II lymph nodes measure up to 9 mm in short axis bilaterally. Left level III nodes measure up to 9 mm as well. Increased number of shotty subcentimeter nodes within the neck bilaterally, left slightly worse than right. No other pathologically enlarged lymph nodes identified within the neck. Vascular: Normal intravascular enhancement seen throughout the neck. Right IJ approach central venous catheter in place. Soft tissue swelling with stranding and scattered foci of soft tissue emphysema within the right neck likely related to central line placement. Limited intracranial: Unremarkable Visualized orbits: Partially visualized inferior globes and orbits unremarkable. Mastoids and visualized paranasal sinuses: Mucosal thickening noted within the visualized maxillary and sphenoid sinuses. Visualized paranasal sinuses are otherwise clear. Visualize mastoids and middle ear cavities are clear. Skeleton: No acute osseous abnormality. No discrete lytic or blastic osseous lesions. Mild cervical spondylolysis present at C5-6. Other: None. CT CHEST FINDINGS Cardiovascular: Normal intravascular enhancement seen throughout the intra-abdominal aorta without aneurysm or other acute abnormality. Minimal atheromatous plaque within the aortic arch. Visualized great vessels within normal limits. Heart size normal. Small moderate pericardial effusion, simple fluid density.  Mediastinum/Nodes: Enlarged nodes within the upper mediastinum measure up to 15 mm in short axis (series 3, image 11). No appreciable supraclavicular adenopathy. No other pathologically enlarged mediastinal lymph nodes. Mild soft tissue density within the right hilum without frank adenopathy. Mildly enlarged 11 mm left hilar node (series 3, image 28). Enlarged axillary adenopathy seen bilaterally, with the largest node on the left measuring 2.1 cm in short axis (series 3, image 16). Largest node on the right measures 1.7 cm in short axis (series 3, image 8). Scattered soft tissue stranding with emphysema and fluid density overlying the left axilla likely related to recent lymph node biopsy, partially visualized. Esophagus within normal limits. Lungs/Pleura: Tracheobronchial tree intact and patent. Moderate layering bilateral pleural effusions with associated atelectasis. No other airspace consolidation. No pulmonary edema. No pneumothorax. 4 mm subpleural ground-glass nodule present at the anterior right upper lobe (series 4, image 57), indeterminate. No other worrisome pulmonary nodule or mass. Musculoskeletal: No acute osseous finding. No discrete lytic or blastic osseous lesions. CT ABDOMEN PELVIS FINDINGS Hepatobiliary: Liver demonstrates a normal contrast enhanced appearance. Gallbladder within normal limits. No biliary dilatation. Pancreas: Pancreas within normal limits. Spleen: Spleen enlarged measuring 14.3 cm in craniocaudad dimension. Few subtle hypodensities noted within the spleen, largest of which measures approximately 12 mm (series 3, image 57), indeterminate. Adrenals/Urinary Tract: Adrenal glands are normal. Kidneys equal in size with symmetric enhancement. No nephrolithiasis, hydronephrosis, or focal enhancing renal mass. No hydroureter. Bladder within normal limits. Stomach/Bowel: Stomach within normal limits. No evidence for bowel obstruction. Normal appendix. No acute inflammatory changes seen  about the bowels. Vascular/Lymphatic: Normal intravascular enhancement seen throughout the intra-abdominal aorta. Mild aorto bi-iliac atherosclerotic disease. No aneurysm. Mesenteric vessels patent proximally. Enlarged 15 mm aortocaval lymph node (series 3, image 7). Additional multifocal shotty subcentimeter aortocaval and periaortic lymph nodes noted. No other pathologically enlarged intra-abdominal lymph nodes identified. Mildly prominent 12 mm left inguinal lymph nodes noted. No other adenopathy within the pelvis. Reproductive: Uterus within normal limits.  Ovaries normal. Other: Moderate volume free fluid within the pelvis, measuring simple fluid density. No free intraperitoneal air. Musculoskeletal: Scattered anasarca noted within the external soft tissues. No acute osseous finding. No discrete lytic or blastic osseous lesions. IMPRESSION: CT NECK IMPRESSION 1. Increased number of shoddy subcentimeter lymph nodes throughout the neck bilaterally, left slightly worse than right. No pathologically enlarged lymph nodes  identified. 2. Mild diffuse thyromegaly without discrete thyroid nodule or mass. 3. Mild soft tissue stranding with emphysema within the right lateral neck related to recent right-sided Port-A-Cath placement. CT CHEST IMPRESSION 1. Enlarged bilateral axillary and upper mediastinal adenopathy as above, likely related to provided history of lymphoma. Additional borderline enlarged left hilar node may be related to lymphoma or possibly reactive in nature. Attention at follow-up recommended. 2. Moderate layering bilateral pleural effusions with associated atelectasis. 3. Small to moderate pericardial effusion. 4. 4 mm right upper lobe ground-glass nodule, indeterminate. Attention at follow-up recommended. CT ABDOMEN AND PELVIS IMPRESSION 1. Enlarged 15 mm aortocaval lymph node with mildly enlarged 12 mm left inguinal lymph nodes. Additional increased number of shotty subcentimeter retroperitoneal lymph  nodes. Findings most likely related to history of lymphoma. 2. Splenomegaly. 3. Moderate volume free fluid within the pelvis, of uncertain etiology, but could be physiologic and/or related overall volume status. 4. Mild diffuse anasarca. Electronically Signed   By: Jeannine Boga M.D.   On: 11/18/2018 19:56   Ct Chest W Contrast  Result Date: 11/18/2018 CLINICAL DATA:  Initial evaluation for Hodgkin's lymphoma, initial workup. EXAM: CT NECK WITH CONTRAST CT CHEST, ABDOMEN, AND PELVIS WITH CONTRAST TECHNIQUE: Multidetector CT imaging of the chest, abdomen and pelvis was performed following the standard protocol during bolus administration of intravenous contrast. CONTRAST:  120m OMNIPAQUE IOHEXOL 300 MG/ML SOLN, 368mOMNIPAQUE IOHEXOL 300 MG/ML SOLN COMPARISON:  None. FINDINGS: CT NECK FINDINGS: Pharynx and larynx: Oral cavity within normal limits without mass lesion or loculated collection. Patient is largely edentulous. Calcified tonsilliths noted within the right palatine tonsil. Tonsils themselves symmetric and within normal limits bilaterally. Parapharyngeal fat maintained. Nasopharynx normal. No retropharyngeal collection. Epiglottis normal. Vallecula clear. Remainder of the hypopharynx and supraglottic larynx normal. Glottis within normal limits. Subglottic airway clear. Salivary glands: Parotid and submandibular glands within normal limits. Thyroid: Thyroid diffusely enlarged without discrete nodule or mass. Lymph nodes: Mildly prominent level II lymph nodes measure up to 9 mm in short axis bilaterally. Left level III nodes measure up to 9 mm as well. Increased number of shotty subcentimeter nodes within the neck bilaterally, left slightly worse than right. No other pathologically enlarged lymph nodes identified within the neck. Vascular: Normal intravascular enhancement seen throughout the neck. Right IJ approach central venous catheter in place. Soft tissue swelling with stranding and scattered  foci of soft tissue emphysema within the right neck likely related to central line placement. Limited intracranial: Unremarkable Visualized orbits: Partially visualized inferior globes and orbits unremarkable. Mastoids and visualized paranasal sinuses: Mucosal thickening noted within the visualized maxillary and sphenoid sinuses. Visualized paranasal sinuses are otherwise clear. Visualize mastoids and middle ear cavities are clear. Skeleton: No acute osseous abnormality. No discrete lytic or blastic osseous lesions. Mild cervical spondylolysis present at C5-6. Other: None. CT CHEST FINDINGS Cardiovascular: Normal intravascular enhancement seen throughout the intra-abdominal aorta without aneurysm or other acute abnormality. Minimal atheromatous plaque within the aortic arch. Visualized great vessels within normal limits. Heart size normal. Small moderate pericardial effusion, simple fluid density. Mediastinum/Nodes: Enlarged nodes within the upper mediastinum measure up to 15 mm in short axis (series 3, image 11). No appreciable supraclavicular adenopathy. No other pathologically enlarged mediastinal lymph nodes. Mild soft tissue density within the right hilum without frank adenopathy. Mildly enlarged 11 mm left hilar node (series 3, image 28). Enlarged axillary adenopathy seen bilaterally, with the largest node on the left measuring 2.1 cm in short axis (series 3, image 16). Largest node  on the right measures 1.7 cm in short axis (series 3, image 8). Scattered soft tissue stranding with emphysema and fluid density overlying the left axilla likely related to recent lymph node biopsy, partially visualized. Esophagus within normal limits. Lungs/Pleura: Tracheobronchial tree intact and patent. Moderate layering bilateral pleural effusions with associated atelectasis. No other airspace consolidation. No pulmonary edema. No pneumothorax. 4 mm subpleural ground-glass nodule present at the anterior right upper lobe (series  4, image 57), indeterminate. No other worrisome pulmonary nodule or mass. Musculoskeletal: No acute osseous finding. No discrete lytic or blastic osseous lesions. CT ABDOMEN PELVIS FINDINGS Hepatobiliary: Liver demonstrates a normal contrast enhanced appearance. Gallbladder within normal limits. No biliary dilatation. Pancreas: Pancreas within normal limits. Spleen: Spleen enlarged measuring 14.3 cm in craniocaudad dimension. Few subtle hypodensities noted within the spleen, largest of which measures approximately 12 mm (series 3, image 57), indeterminate. Adrenals/Urinary Tract: Adrenal glands are normal. Kidneys equal in size with symmetric enhancement. No nephrolithiasis, hydronephrosis, or focal enhancing renal mass. No hydroureter. Bladder within normal limits. Stomach/Bowel: Stomach within normal limits. No evidence for bowel obstruction. Normal appendix. No acute inflammatory changes seen about the bowels. Vascular/Lymphatic: Normal intravascular enhancement seen throughout the intra-abdominal aorta. Mild aorto bi-iliac atherosclerotic disease. No aneurysm. Mesenteric vessels patent proximally. Enlarged 15 mm aortocaval lymph node (series 3, image 7). Additional multifocal shotty subcentimeter aortocaval and periaortic lymph nodes noted. No other pathologically enlarged intra-abdominal lymph nodes identified. Mildly prominent 12 mm left inguinal lymph nodes noted. No other adenopathy within the pelvis. Reproductive: Uterus within normal limits.  Ovaries normal. Other: Moderate volume free fluid within the pelvis, measuring simple fluid density. No free intraperitoneal air. Musculoskeletal: Scattered anasarca noted within the external soft tissues. No acute osseous finding. No discrete lytic or blastic osseous lesions. IMPRESSION: CT NECK IMPRESSION 1. Increased number of shoddy subcentimeter lymph nodes throughout the neck bilaterally, left slightly worse than right. No pathologically enlarged lymph nodes  identified. 2. Mild diffuse thyromegaly without discrete thyroid nodule or mass. 3. Mild soft tissue stranding with emphysema within the right lateral neck related to recent right-sided Port-A-Cath placement. CT CHEST IMPRESSION 1. Enlarged bilateral axillary and upper mediastinal adenopathy as above, likely related to provided history of lymphoma. Additional borderline enlarged left hilar node may be related to lymphoma or possibly reactive in nature. Attention at follow-up recommended. 2. Moderate layering bilateral pleural effusions with associated atelectasis. 3. Small to moderate pericardial effusion. 4. 4 mm right upper lobe ground-glass nodule, indeterminate. Attention at follow-up recommended. CT ABDOMEN AND PELVIS IMPRESSION 1. Enlarged 15 mm aortocaval lymph node with mildly enlarged 12 mm left inguinal lymph nodes. Additional increased number of shotty subcentimeter retroperitoneal lymph nodes. Findings most likely related to history of lymphoma. 2. Splenomegaly. 3. Moderate volume free fluid within the pelvis, of uncertain etiology, but could be physiologic and/or related overall volume status. 4. Mild diffuse anasarca. Electronically Signed   By: Jeannine Boga M.D.   On: 11/18/2018 19:56   Ct Abdomen Pelvis W Contrast  Result Date: 11/18/2018 CLINICAL DATA:  Initial evaluation for Hodgkin's lymphoma, initial workup. EXAM: CT NECK WITH CONTRAST CT CHEST, ABDOMEN, AND PELVIS WITH CONTRAST TECHNIQUE: Multidetector CT imaging of the chest, abdomen and pelvis was performed following the standard protocol during bolus administration of intravenous contrast. CONTRAST:  157m OMNIPAQUE IOHEXOL 300 MG/ML SOLN, 359mOMNIPAQUE IOHEXOL 300 MG/ML SOLN COMPARISON:  None. FINDINGS: CT NECK FINDINGS: Pharynx and larynx: Oral cavity within normal limits without mass lesion or loculated collection. Patient is largely  edentulous. Calcified tonsilliths noted within the right palatine tonsil. Tonsils themselves  symmetric and within normal limits bilaterally. Parapharyngeal fat maintained. Nasopharynx normal. No retropharyngeal collection. Epiglottis normal. Vallecula clear. Remainder of the hypopharynx and supraglottic larynx normal. Glottis within normal limits. Subglottic airway clear. Salivary glands: Parotid and submandibular glands within normal limits. Thyroid: Thyroid diffusely enlarged without discrete nodule or mass. Lymph nodes: Mildly prominent level II lymph nodes measure up to 9 mm in short axis bilaterally. Left level III nodes measure up to 9 mm as well. Increased number of shotty subcentimeter nodes within the neck bilaterally, left slightly worse than right. No other pathologically enlarged lymph nodes identified within the neck. Vascular: Normal intravascular enhancement seen throughout the neck. Right IJ approach central venous catheter in place. Soft tissue swelling with stranding and scattered foci of soft tissue emphysema within the right neck likely related to central line placement. Limited intracranial: Unremarkable Visualized orbits: Partially visualized inferior globes and orbits unremarkable. Mastoids and visualized paranasal sinuses: Mucosal thickening noted within the visualized maxillary and sphenoid sinuses. Visualized paranasal sinuses are otherwise clear. Visualize mastoids and middle ear cavities are clear. Skeleton: No acute osseous abnormality. No discrete lytic or blastic osseous lesions. Mild cervical spondylolysis present at C5-6. Other: None. CT CHEST FINDINGS Cardiovascular: Normal intravascular enhancement seen throughout the intra-abdominal aorta without aneurysm or other acute abnormality. Minimal atheromatous plaque within the aortic arch. Visualized great vessels within normal limits. Heart size normal. Small moderate pericardial effusion, simple fluid density. Mediastinum/Nodes: Enlarged nodes within the upper mediastinum measure up to 15 mm in short axis (series 3, image  11). No appreciable supraclavicular adenopathy. No other pathologically enlarged mediastinal lymph nodes. Mild soft tissue density within the right hilum without frank adenopathy. Mildly enlarged 11 mm left hilar node (series 3, image 28). Enlarged axillary adenopathy seen bilaterally, with the largest node on the left measuring 2.1 cm in short axis (series 3, image 16). Largest node on the right measures 1.7 cm in short axis (series 3, image 8). Scattered soft tissue stranding with emphysema and fluid density overlying the left axilla likely related to recent lymph node biopsy, partially visualized. Esophagus within normal limits. Lungs/Pleura: Tracheobronchial tree intact and patent. Moderate layering bilateral pleural effusions with associated atelectasis. No other airspace consolidation. No pulmonary edema. No pneumothorax. 4 mm subpleural ground-glass nodule present at the anterior right upper lobe (series 4, image 57), indeterminate. No other worrisome pulmonary nodule or mass. Musculoskeletal: No acute osseous finding. No discrete lytic or blastic osseous lesions. CT ABDOMEN PELVIS FINDINGS Hepatobiliary: Liver demonstrates a normal contrast enhanced appearance. Gallbladder within normal limits. No biliary dilatation. Pancreas: Pancreas within normal limits. Spleen: Spleen enlarged measuring 14.3 cm in craniocaudad dimension. Few subtle hypodensities noted within the spleen, largest of which measures approximately 12 mm (series 3, image 57), indeterminate. Adrenals/Urinary Tract: Adrenal glands are normal. Kidneys equal in size with symmetric enhancement. No nephrolithiasis, hydronephrosis, or focal enhancing renal mass. No hydroureter. Bladder within normal limits. Stomach/Bowel: Stomach within normal limits. No evidence for bowel obstruction. Normal appendix. No acute inflammatory changes seen about the bowels. Vascular/Lymphatic: Normal intravascular enhancement seen throughout the intra-abdominal aorta.  Mild aorto bi-iliac atherosclerotic disease. No aneurysm. Mesenteric vessels patent proximally. Enlarged 15 mm aortocaval lymph node (series 3, image 7). Additional multifocal shotty subcentimeter aortocaval and periaortic lymph nodes noted. No other pathologically enlarged intra-abdominal lymph nodes identified. Mildly prominent 12 mm left inguinal lymph nodes noted. No other adenopathy within the pelvis. Reproductive: Uterus within normal limits.  Ovaries normal. Other: Moderate volume free fluid within the pelvis, measuring simple fluid density. No free intraperitoneal air. Musculoskeletal: Scattered anasarca noted within the external soft tissues. No acute osseous finding. No discrete lytic or blastic osseous lesions. IMPRESSION: CT NECK IMPRESSION 1. Increased number of shoddy subcentimeter lymph nodes throughout the neck bilaterally, left slightly worse than right. No pathologically enlarged lymph nodes identified. 2. Mild diffuse thyromegaly without discrete thyroid nodule or mass. 3. Mild soft tissue stranding with emphysema within the right lateral neck related to recent right-sided Port-A-Cath placement. CT CHEST IMPRESSION 1. Enlarged bilateral axillary and upper mediastinal adenopathy as above, likely related to provided history of lymphoma. Additional borderline enlarged left hilar node may be related to lymphoma or possibly reactive in nature. Attention at follow-up recommended. 2. Moderate layering bilateral pleural effusions with associated atelectasis. 3. Small to moderate pericardial effusion. 4. 4 mm right upper lobe ground-glass nodule, indeterminate. Attention at follow-up recommended. CT ABDOMEN AND PELVIS IMPRESSION 1. Enlarged 15 mm aortocaval lymph node with mildly enlarged 12 mm left inguinal lymph nodes. Additional increased number of shotty subcentimeter retroperitoneal lymph nodes. Findings most likely related to history of lymphoma. 2. Splenomegaly. 3. Moderate volume free fluid within  the pelvis, of uncertain etiology, but could be physiologic and/or related overall volume status. 4. Mild diffuse anasarca. Electronically Signed   By: Jeannine Boga M.D.   On: 11/18/2018 19:56   Ct Biopsy  Result Date: 11/15/2018 INDICATION: Pancytopenia, concern for lymphoproliferative process EXAM: CT GUIDED RIGHT ILIAC BONE MARROW ASPIRATION AND CORE BIOPSY Date:  11/15/2018 11/15/2018 10:13 am Radiologist:  M. Daryll Brod, MD Guidance:  CT FLUOROSCOPY TIME:  Fluoroscopy Time: None. MEDICATIONS: 1% lidocaine local ANESTHESIA/SEDATION: 2.0 mg IV Versed; 50 mcg IV Fentanyl Moderate Sedation Time:  10 minutes The patient was continuously monitored during the procedure by the interventional radiology nurse under my direct supervision. CONTRAST:  None. COMPLICATIONS: None PROCEDURE: Informed consent was obtained from the patient following explanation of the procedure, risks, benefits and alternatives. The patient understands, agrees and consents for the procedure. All questions were addressed. A time out was performed. The patient was positioned prone and non-contrast localization CT was performed of the pelvis to demonstrate the iliac marrow spaces. Maximal barrier sterile technique utilized including caps, mask, sterile gowns, sterile gloves, large sterile drape, hand hygiene, and Betadine prep. Under sterile conditions and local anesthesia, an 11 gauge coaxial bone biopsy needle was advanced into the right iliac marrow space. Needle position was confirmed with CT imaging. Initially, bone marrow aspiration was performed. Next, the 11 gauge outer cannula was utilized to obtain a right iliac bone marrow core biopsy. Needle was removed. Hemostasis was obtained with compression. The patient tolerated the procedure well. Samples were prepared with the cytotechnologist. No immediate complications. IMPRESSION: CT guided right iliac bone marrow aspiration and core biopsy. Electronically Signed   By: Jerilynn Mages.  Shick  M.D.   On: 11/15/2018 11:25   Dg Chest Port 1 View  Result Date: 11/11/2018 CLINICAL DATA:  Fever EXAM: PORTABLE CHEST 1 VIEW COMPARISON:  None. FINDINGS: Mild cardiomegaly. No focal consolidation or effusion. No pneumothorax. IMPRESSION: No active disease. Electronically Signed   By: Donavan Foil M.D.   On: 11/11/2018 21:59   Ct Bone Marrow Biopsy & Aspiration  Result Date: 11/15/2018 INDICATION: Pancytopenia, concern for lymphoproliferative process EXAM: CT GUIDED RIGHT ILIAC BONE MARROW ASPIRATION AND CORE BIOPSY Date:  11/15/2018 11/15/2018 10:13 am Radiologist:  M. Daryll Brod, MD Guidance:  CT FLUOROSCOPY TIME:  Fluoroscopy Time: None. MEDICATIONS: 1% lidocaine local ANESTHESIA/SEDATION: 2.0 mg IV Versed; 50 mcg IV Fentanyl Moderate Sedation Time:  10 minutes The patient was continuously monitored during the procedure by the interventional radiology nurse under my direct supervision. CONTRAST:  None. COMPLICATIONS: None PROCEDURE: Informed consent was obtained from the patient following explanation of the procedure, risks, benefits and alternatives. The patient understands, agrees and consents for the procedure. All questions were addressed. A time out was performed. The patient was positioned prone and non-contrast localization CT was performed of the pelvis to demonstrate the iliac marrow spaces. Maximal barrier sterile technique utilized including caps, mask, sterile gowns, sterile gloves, large sterile drape, hand hygiene, and Betadine prep. Under sterile conditions and local anesthesia, an 11 gauge coaxial bone biopsy needle was advanced into the right iliac marrow space. Needle position was confirmed with CT imaging. Initially, bone marrow aspiration was performed. Next, the 11 gauge outer cannula was utilized to obtain a right iliac bone marrow core biopsy. Needle was removed. Hemostasis was obtained with compression. The patient tolerated the procedure well. Samples were prepared with the  cytotechnologist. No immediate complications. IMPRESSION: CT guided right iliac bone marrow aspiration and core biopsy. Electronically Signed   By: Jerilynn Mages.  Shick M.D.   On: 11/15/2018 11:25   Ir Imaging Guided Port Insertion  Result Date: 11/18/2018 INDICATION: 53 year old female with history lymphoma EXAM: IMPLANTED PORT A CATH PLACEMENT WITH ULTRASOUND AND FLUOROSCOPIC GUIDANCE MEDICATIONS: 2.0 g Ancef; The antibiotic was administered within an appropriate time interval prior to skin puncture. ANESTHESIA/SEDATION: Moderate (conscious) sedation was employed during this procedure. A total of Versed 2.0 mg and Fentanyl 100 mcg was administered intravenously. Moderate Sedation Time: 18 minutes. The patient's level of consciousness and vital signs were monitored continuously by radiology nursing throughout the procedure under my direct supervision. FLUOROSCOPY TIME:  0 minutes, 12 seconds (1.0 mGy) COMPLICATIONS: None PROCEDURE: The procedure, risks, benefits, and alternatives were explained to the patient. Questions regarding the procedure were encouraged and answered. The patient understands and consents to the procedure. Ultrasound survey was performed with images stored and sent to PACs. The right neck and chest was prepped with chlorhexidine, and draped in the usual sterile fashion using maximum barrier technique (cap and mask, sterile gown, sterile gloves, large sterile sheet, hand hygiene and cutaneous antiseptic). Antibiotic prophylaxis was provided with 2.0g Ancef administered IV one hour prior to skin incision. Local anesthesia was attained by infiltration with 1% lidocaine without epinephrine. Ultrasound demonstrated patency of the right internal jugular vein, and this was documented with an image. Under real-time ultrasound guidance, this vein was accessed with a 21 gauge micropuncture needle and image documentation was performed. A small dermatotomy was made at the access site with an 11 scalpel. A 0.018"  wire was advanced into the SVC and used to estimate the length of the internal catheter. The access needle exchanged for a 16F micropuncture vascular sheath. The 0.018" wire was then removed and a 0.035" wire advanced into the IVC. An appropriate location for the subcutaneous reservoir was selected below the clavicle and an incision was made through the skin and underlying soft tissues. The subcutaneous tissues were then dissected using a combination of blunt and sharp surgical technique and a pocket was formed. A single lumen power injectable portacatheter was then tunneled through the subcutaneous tissues from the pocket to the dermatotomy and the port reservoir placed within the subcutaneous pocket. The venous access site was then serially dilated and a peel away vascular sheath  placed over the wire. The wire was removed and the port catheter advanced into position under fluoroscopic guidance. The catheter tip is positioned in the cavoatrial junction. This was documented with a spot image. The portacatheter was then tested and found to flush and aspirate well. The port was flushed with saline followed by 100 units/mL heparinized saline. The pocket was then closed in two layers using first subdermal inverted interrupted absorbable sutures followed by a running subcuticular suture. The epidermis was then sealed with Dermabond. The dermatotomy at the venous access site was also seal with Dermabond. Patient tolerated the procedure well and remained hemodynamically stable throughout. No complications encountered and no significant blood loss encountered IMPRESSION: Status post right IJ port catheter placement. Catheter ready for use. Signed, Dulcy Fanny. Dellia Nims, RPVI [facility] Radiology Electronically Signed   By: Corrie Mckusick D.O.   On: 11/18/2018 11:23    Lab Data:  CBC: Recent Labs  Lab 11/20/18 0500 11/21/18 0619 11/22/18 0609 11/23/18 0610  11/24/18 0600  WBC 8.7 18.2* 5.5 1.1* 0.5*  NEUTROABS  --   --   --  0.9* 0.2*  HGB 9.3* 8.4* 6.3* 9.2* 9.4*  HCT 28.8* 26.3* 20.1* 27.7* 29.6*  MCV 84.0 85.9 87.4 86.3 88.9  PLT 80* 68* 32* 26* 28*   Basic Metabolic Panel: Recent Labs  Lab 11/20/18 0500 11/21/18 0619 11/22/18 0609 11/23/18 0610 11/23/18 1000 11/23/18 1502 11/24/18 0634  NA 137 138 138 138  --   --  140  K 4.4 3.5 3.1* 2.7*  --  3.1* 2.8*  CL 107 108 109 104  --   --  105  CO2 19* 18* 20* 21*  --   --  15*  GLUCOSE 120* 99 103* 91  --   --  80  BUN '15 19 18 14  ' --   --  6  CREATININE 0.59 0.86 0.69 0.51  --   --  0.67  CALCIUM 7.6* 7.3* 7.1* 7.3*  --   --  7.5*  MG  --   --   --   --  2.0  --   --    GFR: Estimated Creatinine Clearance: 82 mL/min (by C-G formula based on SCr of 0.67 mg/dL). Liver Function Tests: Recent Labs  Lab 11/19/18 0805 11/23/18 0610  AST 30 32  ALT 15 25  ALKPHOS 154* 126  BILITOT 1.2 1.2  PROT 5.1* 4.9*  ALBUMIN 1.7* 1.7*   No results for input(s): LIPASE, AMYLASE in the last 168 hours. No results for input(s): AMMONIA in the last 168 hours. Coagulation Profile: Recent Labs  Lab 11/18/18 0354  INR 1.65   Cardiac Enzymes: No results for input(s): CKTOTAL, CKMB, CKMBINDEX, TROPONINI in the last 168 hours. BNP (last 3 results) No results for input(s): PROBNP in the last 8760 hours. HbA1C: No results for input(s): HGBA1C in the last 72 hours. CBG: Recent Labs  Lab 11/20/18 2153  GLUCAP 92   Lipid Profile: No results for input(s): CHOL, HDL, LDLCALC, TRIG, CHOLHDL, LDLDIRECT in the last 72 hours. Thyroid Function Tests: No results for input(s): TSH, T4TOTAL, FREET4, T3FREE, THYROIDAB in the last 72 hours. Anemia Panel: No results for input(s): VITAMINB12, FOLATE, FERRITIN, TIBC, IRON, RETICCTPCT in the last 72 hours. Urine analysis:    Component Value Date/Time   COLORURINE YELLOW 11/14/2018 1619   APPEARANCEUR HAZY (A) 11/14/2018 1619   LABSPEC 1.016  11/14/2018 1619   PHURINE 5.0 11/14/2018 1619   GLUCOSEU  NEGATIVE 11/14/2018 1619   HGBUR SMALL (A) 11/14/2018 1619   BILIRUBINUR NEGATIVE 11/14/2018 1619   KETONESUR NEGATIVE 11/14/2018 1619   PROTEINUR 30 (A) 11/14/2018 1619   NITRITE NEGATIVE 11/14/2018 1619   LEUKOCYTESUR NEGATIVE 11/14/2018 1619     Jordie Schreur M.D. [facility] 11/24/2018, 11:48 AM  Pager: [phone] Between 7am to 7pm - call Pager - [phone]  After 7pm go to [email or web address] - password [email or web address]  Call night coverage person covering after 7pm

## 2018-11-24 NOTE — Progress Notes (Signed)
**Note Cindy-Identified via Obfuscation** Cindy Ward   DOB:12/17/1965   MO#:[id number]   KPT#:[id number]  Hem/Onc follow up note   Subjective: Pt had watery diarrhea last night, non this morning, complains of low abdominal pain. She also noticed some blood in urine last night, but cleared this morning, no other bleeding. Remains to be afebrile, hemodynamically stable.  She is still quite fatigued, with low appetite, sore throat, and not eating much.   Objective:  Vitals:   11/24/18 0627 11/24/18 1312  BP: 136/85 139/85  Pulse: 95 (!) 102  Resp: 18 18  Temp: 98.7 F (37.1 C)   SpO2: 97% 97%    Body mass index is 25.09 kg/m.  Intake/Output Summary (Last 24 hours) at 11/24/2018 1436 Last data filed at 11/23/2018 1500 Gross per 24 hour  Intake -  Output 250 ml  Net -250 ml     Sclerae unicteric  (+) Mucositis and oral thrush  Lungs clear --decreased BS on lung bases   Heart regular rate and rhythm  Abdomen benign  MSK no focal spinal tenderness, no peripheral edema  Neuro nonfocal  Skin: Small ecchymosis on her both arms.  CBG (last 3)  No results for input(s): GLUCAP in the last 72 hours.   Labs:  CBC Latest Ref Rng & Units 11/24/2018 11/23/2018 11/22/2018  WBC 4.0 - 10.5 K/uL 0.5(LL) 1.1(LL) 5.5  Hemoglobin 12.0 - 15.0 g/dL 9.4(L) 9.2(L) 6.3(LL)  Hematocrit 36.0 - 46.0 % 29.6(L) 27.7(L) 20.1(L)  Platelets 150 - 400 K/uL 28(LL) 26(LL) 32(L)    Urine Studies No results for input(s): UHGB, CRYS in the last 72 hours.  Invalid input(s): UACOL, UAPR, USPG, UPH, UTP, UGL, [place], UBIL, UNIT, UROB, [place], UEPI, UWBC, Junie Panning [place], [place], [place]  Basic Metabolic Panel: Recent Labs  Lab 11/20/18 0500 11/21/18 1700 11/22/18 0609 11/23/18 0610 11/23/18 1000 11/23/18 1502 11/24/18 0634  NA 137 138 138 138  --   --  140  K 4.4 3.5 3.1* 2.7*  --  3.1* 2.8*  CL 107 108 109 104  --   --  105  CO2 19* 18* 20* 21*  --   --  15*  GLUCOSE 120* 99 103* 91  --   --  80  BUN 15 19 18 14   --   --  6  CREATININE 0.59 0.86  0.69 0.51  --   --  0.67  CALCIUM 7.6* 7.3* 7.1* 7.3*  --   --  7.5*  MG  --   --   --   --  2.0  --   --    GFR Estimated Creatinine Clearance: 82 mL/min (by C-G formula based on SCr of 0.67 mg/dL). Liver Function Tests: Recent Labs  Lab 11/19/18 0805 11/23/18 0610  AST 30 32  ALT 15 25  ALKPHOS 154* 126  BILITOT 1.2 1.2  PROT 5.1* 4.9*  ALBUMIN 1.7* 1.7*   No results for input(s): LIPASE, AMYLASE in the last 168 hours. No results for input(s): AMMONIA in the last 168 hours. Coagulation profile Recent Labs  Lab 11/18/18 0354  INR 1.65    CBC: Recent Labs  Lab 11/20/18 0500 11/21/18 0619 11/22/18 0609 11/23/18 0610 11/24/18 0600  WBC 8.7 18.2* 5.5 1.1* 0.5*  NEUTROABS  --   --   --  0.9* 0.2*  HGB 9.3* 8.4* 6.3* 9.2* 9.4*  HCT 28.8* 26.3* 20.1* 27.7* 29.6*  MCV 84.0 85.9 87.4 86.3 88.9  PLT 80* 68* 32* 26* 28*   Cardiac Enzymes: No results for  input(s): CKTOTAL, CKMB, CKMBINDEX, TROPONINI in the last 168 hours. BNP: Invalid input(s): POCBNP CBG: Recent Labs  Lab 11/20/18 2153  GLUCAP 92   D-Dimer No results for input(s): DDIMER in the last 72 hours. Hgb A1c No results for input(s): HGBA1C in the last 72 hours. Lipid Profile No results for input(s): CHOL, HDL, LDLCALC, TRIG, CHOLHDL, LDLDIRECT in the last 72 hours. Thyroid function studies No results for input(s): TSH, T4TOTAL, T3FREE, THYROIDAB in the last 72 hours.  Invalid input(s): FREET3 Anemia work up No results for input(s): VITAMINB12, FOLATE, FERRITIN, TIBC, IRON, RETICCTPCT in the last 72 hours. Microbiology Recent Results (from the past 240 hour(s))  Culture, Urine     Status: None   Collection Time: 11/14/18  4:20 PM  Result Value Ref Range Status   Specimen Description   Final    URINE, CLEAN CATCH Performed at [facility] [address], [place], [place] [address]    Special Requests   Final    NONE Performed at [facility], [place]  [address] [place], [place] [address]    Culture   Final    NO GROWTH Performed at [facility], [place] [address], [place], [place] [address]    Report Status 11/15/2018 FINAL  Final  Culture, blood (routine x 2)     Status: None (Preliminary result)   Collection Time: 11/21/18  2:51 PM  Result Value Ref Range Status   Specimen Description   Final    BLOOD LEFT ANTECUBITAL Performed at [facility] [address], [place], [place] [address]    Special Requests   Final    BOTTLES DRAWN AEROBIC AND ANAEROBIC Blood Culture adequate volume Performed at [facility] [address], [place], [place] [address]    Culture   Final    NO GROWTH 2 DAYS Performed at [facility] [address] [place], [place] [address]    Report Status PENDING  Incomplete  Culture, blood (routine x 2)     Status: None (Preliminary result)   Collection Time: 11/21/18  2:59 PM  Result Value Ref Range Status   Specimen Description   Final    BLOOD RIGHT ANTECUBITAL Performed at [facility] [address] [place], [place] [address]    Special Requests   Final    BOTTLES DRAWN AEROBIC ONLY Blood Culture adequate volume Performed at [facility] [address] [place],  [address]    Culture   Final    NO GROWTH 2 DAYS Performed at [facility] [address] [place],  [address]    Report Status PENDING  Incomplete      Studies:  No results found.  Assessment: 53 y.o. with newly diagnosed Hodgkin lymphoma, stage IV, status post first cycle chemotherapy AVD+ Adcedrison 11/19/2018  1.  Neutropenia secondary to chemotherapy, On Granix daily  2.  Thrombocytopenia, secondary to chemotherapy 3.  Normocytic anemia, secondary to lymphoma and chemotherapy, status post multiple blood transfusion 4.  Hodgkin lymphoma, stage IV 5.  Fever, possible secondary to lymphoma, negative ID work-up 6.   Severe calorie and protein malnutrition 7. Oral thrush  8. B/l pleural effusion and ascites, ? Hypoalbuminemia related  9. Diarrhea 9.  Deconditioning  Plan:  -Continue Granix 480 mcg daily, I will continue until her ANC>1.0. If she spikes fever again, she would need restart broad antibiotics again due to the neutroepnia  -for her  severe oral thrush, I will order fluconazole for her, continue magic mouth wash -pt's family had multiple questions about her staging results (BM biopsy, CT scan etc), I explained to them  -she is little bitter more tachypneic today, continue monitoring, may consider repeat x-ray tomorrow to see if her pleural effusion is getting worse  -if she has recurrent diarrhea, please check c-diff  -I again encourage her to drink more, and get out of bed  -Dr. Maylon Peppers will resume care tomorrow   Truitt Merle, MD 11/24/2018  2:36 PM

## 2018-11-25 ENCOUNTER — Inpatient Hospital Stay (HOSPITAL_COMMUNITY): Payer: Medicaid Other

## 2018-11-25 DIAGNOSIS — R0600 Dyspnea, unspecified: Secondary | ICD-10-CM

## 2018-11-25 DIAGNOSIS — C8100 Nodular lymphocyte predominant Hodgkin lymphoma, unspecified site: Secondary | ICD-10-CM

## 2018-11-25 LAB — CBC WITH DIFFERENTIAL/PLATELET
Band Neutrophils: 0 %
Basophils Absolute: 0 10*3/uL (ref 0.0–0.1)
Basophils Relative: 0 %
Eosinophils Absolute: 0 10*3/uL (ref 0.0–0.5)
Eosinophils Relative: 0 %
HCT: 29 % — ABNORMAL LOW (ref 36.0–46.0)
HEMOGLOBIN: 9.1 g/dL — AB (ref 12.0–15.0)
Lymphocytes Relative: 0 %
Lymphs Abs: 0 10*3/uL — ABNORMAL LOW (ref 0.7–4.0)
MCH: 27.9 pg (ref 26.0–34.0)
MCHC: 31.4 g/dL (ref 30.0–36.0)
MCV: 89 fL (ref 80.0–100.0)
Monocytes Absolute: 0 10*3/uL — ABNORMAL LOW (ref 0.1–1.0)
Monocytes Relative: 0 %
NEUTROS PCT: 0 %
Platelets: 19 10*3/uL — CL (ref 150–400)
RBC: 3.26 MIL/uL — ABNORMAL LOW (ref 3.87–5.11)
RDW: 18.3 % — ABNORMAL HIGH (ref 11.5–15.5)
WBC: 0.2 10*3/uL — CL (ref 4.0–10.5)
nRBC: 0 % (ref 0.0–0.2)
nRBC: 0 /100 WBC

## 2018-11-25 LAB — BLOOD GAS, ARTERIAL
Drawn by: 441261
FIO2: 21
O2 Saturation: 96.4 %
Patient temperature: 100.3
pH, Arterial: 7.446 (ref 7.350–7.450)
pO2, Arterial: 77 mmHg — ABNORMAL LOW (ref 83.0–108.0)

## 2018-11-25 LAB — MAGNESIUM: Magnesium: 1.9 mg/dL (ref 1.7–2.4)

## 2018-11-25 LAB — BASIC METABOLIC PANEL
ANION GAP: 19 — AB (ref 5–15)
Anion gap: 14 (ref 5–15)
BUN: <5 mg/dL — ABNORMAL LOW (ref 6–20)
BUN: <5 mg/dL — ABNORMAL LOW (ref 6–20)
CO2: 14 mmol/L — ABNORMAL LOW (ref 22–32)
CO2: 18 mmol/L — ABNORMAL LOW (ref 22–32)
Calcium: 6.8 mg/dL — ABNORMAL LOW (ref 8.9–10.3)
Calcium: 6.4 mg/dL — CL (ref 8.9–10.3)
Chloride: 109 mmol/L (ref 98–111)
Chloride: 113 mmol/L — ABNORMAL HIGH (ref 98–111)
Creatinine, Ser: 0.59 mg/dL (ref 0.44–1.00)
Creatinine, Ser: 0.48 mg/dL (ref 0.44–1.00)
GFR calc Af Amer: >60 mL/min (ref >60)
GFR calc non Af Amer: >60 mL/min (ref >60)
Glucose, Bld: 129 mg/dL — ABNORMAL HIGH (ref 70–99)
Glucose, Bld: 174 mg/dL — ABNORMAL HIGH (ref 70–99)
POTASSIUM: 3.3 mmol/L — AB (ref 3.5–5.1)
Potassium: 2.8 mmol/L — ABNORMAL LOW (ref 3.5–5.1)
Sodium: 142 mmol/L (ref 135–145)
Sodium: 145 mmol/L (ref 135–145)

## 2018-11-25 LAB — AMMONIA: AMMONIA: 24 umol/L (ref 9–35)

## 2018-11-25 LAB — TYPE AND SCREEN
ABO/RH(D): AB NEG
Antibody Screen: NEGATIVE

## 2018-11-25 LAB — ALBUMIN: Albumin: 2.1 g/dL — ABNORMAL LOW (ref 3.5–5.0)

## 2018-11-25 MED ORDER — LEVOTHYROXINE SODIUM 100 MCG/5ML IV SOLN
37.5000 ug | Freq: Every day | INTRAVENOUS | Status: DC
  Administered 2018-11-25 – 2018-12-04 (×10): 37.5 ug via INTRAVENOUS
  Filled 2018-11-25 (×10): qty 5

## 2018-11-25 MED ORDER — POTASSIUM CHLORIDE CRYS ER 20 MEQ PO TBCR
40.0000 meq | EXTENDED_RELEASE_TABLET | Freq: Two times a day (BID) | ORAL | Status: DC
  Administered 2018-11-25: 40 meq via ORAL
  Filled 2018-11-25: qty 2

## 2018-11-25 MED ORDER — SODIUM BICARBONATE 8.4 % IV SOLN
INTRAVENOUS | Status: DC
  Administered 2018-11-25 – 2018-11-26 (×2): via INTRAVENOUS
  Filled 2018-11-25 (×3): qty 150

## 2018-11-25 MED ORDER — POTASSIUM CHLORIDE 10 MEQ/100ML IV SOLN
10.0000 meq | INTRAVENOUS | Status: AC
  Administered 2018-11-25 (×3): 10 meq via INTRAVENOUS
  Filled 2018-11-25 (×3): qty 100

## 2018-11-25 MED ORDER — VANCOMYCIN HCL 10 G IV SOLR
1500.0000 mg | Freq: Once | INTRAVENOUS | Status: AC
  Administered 2018-11-25: 1500 mg via INTRAVENOUS
  Filled 2018-11-25: qty 1500

## 2018-11-25 MED ORDER — TBO-FILGRASTIM 480 MCG/0.8ML ~~LOC~~ SOSY
480.0000 ug | PREFILLED_SYRINGE | Freq: Every day | SUBCUTANEOUS | Status: DC
  Administered 2018-11-25 – 2018-12-03 (×9): 480 ug via SUBCUTANEOUS
  Filled 2018-11-25 (×10): qty 0.8

## 2018-11-25 MED ORDER — CHLORHEXIDINE GLUCONATE CLOTH 2 % EX PADS: 6.0000 | MEDICATED_PAD | Freq: Every day | CUTANEOUS | Status: DC

## 2018-11-25 MED ORDER — SODIUM CHLORIDE 0.9 % IV SOLN
2.0000 g | Freq: Three times a day (TID) | INTRAVENOUS | Status: DC
  Administered 2018-11-25 (×2): 2 g via INTRAVENOUS
  Filled 2018-11-25 (×3): qty 2

## 2018-11-25 MED ORDER — FLUCONAZOLE IN SODIUM CHLORIDE 200-0.9 MG/100ML-% IV SOLN
200.0000 mg | INTRAVENOUS | Status: DC
  Administered 2018-11-25 – 2018-12-01 (×7): 200 mg via INTRAVENOUS
  Filled 2018-11-25 (×9): qty 100

## 2018-11-25 MED ORDER — PHENOL 1.4 % MT LIQD
1.0000 | OROMUCOSAL | Status: DC | PRN
  Filled 2018-11-25: qty 177

## 2018-11-25 MED ORDER — POTASSIUM CHLORIDE 10 MEQ/100ML IV SOLN
10.0000 meq | INTRAVENOUS | Status: AC
  Administered 2018-11-25 (×4): 10 meq via INTRAVENOUS
  Filled 2018-11-25 (×4): qty 100

## 2018-11-25 MED ORDER — CHLORHEXIDINE GLUCONATE CLOTH 2 % EX PADS
6.0000 | MEDICATED_PAD | Freq: Every day | CUTANEOUS | Status: DC
  Administered 2018-11-26 – 2018-12-07 (×9): 6 via TOPICAL

## 2018-11-25 MED ORDER — SODIUM CHLORIDE 0.9% IV SOLUTION
Freq: Once | INTRAVENOUS | Status: AC
  Administered 2018-11-25: 11:00:00 via INTRAVENOUS

## 2018-11-25 MED ORDER — CALCIUM GLUCONATE-NACL 1-0.675 GM/50ML-% IV SOLN
1.0000 g | Freq: Once | INTRAVENOUS | Status: AC
  Administered 2018-11-25: 1000 mg via INTRAVENOUS
  Filled 2018-11-25: qty 50

## 2018-11-25 MED ORDER — BOOST / RESOURCE BREEZE PO LIQD CUSTOM: 1.0000 | ORAL | Status: DC

## 2018-11-25 MED ORDER — KCL IN DEXTROSE-NACL 20-5-0.45 MEQ/L-%-% IV SOLN
INTRAVENOUS | Status: DC
  Administered 2018-11-25: 09:00:00 via INTRAVENOUS
  Filled 2018-11-25 (×2): qty 1000

## 2018-11-25 MED ORDER — VANCOMYCIN HCL IN DEXTROSE 1-5 GM/200ML-% IV SOLN
1000.0000 mg | Freq: Two times a day (BID) | INTRAVENOUS | Status: DC
  Administered 2018-11-25: 1000 mg via INTRAVENOUS
  Filled 2018-11-25: qty 200

## 2018-11-25 MED ORDER — NYSTATIN 100000 UNIT/ML MT SUSP
5.0000 mL | Freq: Four times a day (QID) | OROMUCOSAL | Status: DC
  Administered 2018-11-25 – 2018-12-12 (×54): 500000 [IU] via ORAL
  Filled 2018-11-25 (×59): qty 5

## 2018-11-25 NOTE — Progress Notes (Signed)
[facility]                                                                              Patient Demographics  Cindy Ward, is a 53 y.o. female, DOB - November 21, 1965, RTM:[id number]  Admit date - 11/11/2018   Admitting Physician A Melven Sartorius., MD  Outpatient Primary MD for the patient is Cindy Hipps, MD  Outpatient specialists:   LOS - 14  days   Medical records reviewed and are as summarized below:    No chief complaint on file.      Brief summary   53 y.o.femalewith medical history significant ofhypertension hypothyroidism presents at [facility] with recurrent nausea, vomiting, fevers, and night sweats of 9 months duration.She notes that the symptoms occur about every 2 weeks. She has nightly fevers and night sweats. The symptoms occur for days at a time, but have been taking longer and longer to resolve.She came to the hospital today because she passed out at the cancer center. She describes getting dizzy and then hitting the floor. She was seen in the emergency department at [facility]. Her vitals were notable for a fever to 102.7. Multiple enlarged lymph nodes noted on CT abdomen and pelvis. The case was discussed with [name] who recommended transfer to [facility] for further workup as well as LDH, haptoglobin, and retics. 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 Hospital course complicated by recurrent high fevers related to malignancy.   Assessment & Plan    Principal Problem:   Hodgkin lymphoma ([place]) Newly diagnosed, stage IV -Patient presented with persistent fevers, lymphadenopathy, no active infective process -Lymph node biopsies on 2/18 showed classical Hodgkin lymphoma -CT chest abdomen and pelvis, neck showed a diffuse lymphadenopathy involving the cervical, thoracic and abdominal lymph nodes -Port-A-Cath placed on 2/24, started on chemotherapy on 2/25, next cycle  after 2 weeks, -Management per oncology  Fevers, acute respiratory failure -Initially had presented with FUO, patient had extensive work-up during this hospitalization for fevers and found to have stage IV Hodgkin lymphoma. -Fever defervesced for last 2 days however started spiking fevers this morning again with tachycardia, tachypnea -Given patient is getting neutropenic and febrile, started on vancomycin and cefepime today -Obtain stat ABG, chest x-ray, stat blood cultures -BiPAP as needed.  CCM consulted, discussed with Dr Ander Slade  Acute metabolic encephalopathy -Patient noticed to be significantly lethargic this morning, febrile, neutropenic, platelets trending down -Obtain ABG, stat CT head given severe thrombocytopenia rule out intracranial bleed -Given fevers, patient placed on empiric antibiotics  Pancytopenia with neutropenia, thrombocytopenia, anemia -Platelets trending down, neutropenia worsening -Continue Granix, oncology managing -Platelets trending down to 19 K today, will transfuse 1 platelet pheresis -Oncology following  Hypokalemia Continue daily replacement with IV fluids and KCl 10 mEQ X4  Severe protein calorie malnutrition in the setting of malignancy, dysphagia -Patient having difficulty eating. -Oral thrush, started on nystatin, continue Diflucan, changed to IV  Small pericardial effusion 2D echo on 2/18 showed EF of 55 to 60%, small pericardial effusion most prominently along the RV/RA, stable, no tamponade physiology Outpatient follow-up with periodic EKGs  Near syncope  Likely secondary to anemia, dehydration, #1  Hypothyroidism Changed to IV Synthroid  Generalized debility PT evaluation recommended home health PT versus skilled nursing facility, patient prefers home health PT although does not have 24/7 supervision   Code Status: Full code DVT Prophylaxis:  SCD's Family Communication: Discussed in detail with the patient, all imaging results, lab  results explained to the patient   Disposition Plan: Patient with acute mental status changes, respiratory failure, lethargic, transferred to stepdown unit  Time Spent in minutes 25 minutes  Procedures:  2D echo  Consultants:   Oncology Infectious disease CCM  Antimicrobials:   Anti-infectives (From admission, onward)   Start     Dose/Rate Route Frequency Ordered Stop   11/25/18 1200  fluconazole (DIFLUCAN) IVPB 200 mg     200 mg 100 mL/hr over 60 Minutes Intravenous Every 24 hours 11/25/18 0836     11/25/18 1000  vancomycin (VANCOCIN) IVPB 1000 mg/200 mL premix     1,000 mg 200 mL/hr over 60 Minutes Intravenous Every 12 hours 11/25/18 0848     11/25/18 1000  ceFEPIme (MAXIPIME) 2 g in sodium chloride 0.9 % 100 mL IVPB     2 g 200 mL/hr over 30 Minutes Intravenous Every 8 hours 11/25/18 0848     11/25/18 0900  vancomycin (VANCOCIN) 1,500 mg in sodium chloride 0.9 % 500 mL IVPB     1,500 mg 250 mL/hr over 120 Minutes Intravenous  Once 11/25/18 0848 11/25/18 1135   11/24/18 1500  fluconazole (DIFLUCAN) tablet 200 mg  Status:  Discontinued     200 mg Oral Daily 11/24/18 1450 11/25/18 0836   11/18/18 0600  ceFAZolin (ANCEF) IVPB 2g/100 mL premix     2 g 200 mL/hr over 30 Minutes Intravenous To Radiology 11/17/18 1243 11/18/18 1130   11/12/18 0800  cefTRIAXone (ROCEPHIN) 2 g in sodium chloride 0.9 % 100 mL IVPB  Status:  Discontinued     2 g 200 mL/hr over 30 Minutes Intravenous Every 24 hours 11/11/18 1711 11/12/18 1057         Medications  Scheduled Meds: . [START ON 11/26/2018] feeding supplement  1 Container Oral Q24H  . levothyroxine  37.5 mcg Intravenous Daily  . mouth rinse  15 mL Mouth Rinse BID  . multivitamin with minerals  1 tablet Oral Daily  . nystatin  5 mL Oral QID  . protein supplement  8 oz Oral q12n4p  . scopolamine  1 patch Transdermal Q72H  . Tbo-filgastrim (GRANIX) SQ  480 mcg Subcutaneous q1800  . Tbo-filgastrim (GRANIX) SQ  480 mcg Subcutaneous  q1800   Continuous Infusions: . sodium chloride 500 mL (11/25/18 0933)  . sodium chloride    . ceFEPime (MAXIPIME) IV Stopped (11/25/18 1038)  . dextrose 5 % and 0.45 % NaCl with KCl 20 mEq/L Stopped (11/25/18 1211)  . fluconazole (DIFLUCAN) IV 100 mL/hr at 11/25/18 1232  . potassium chloride 10 mEq (11/25/18 1211)  . vancomycin     PRN Meds:.sodium chloride, acetaminophen **OR** acetaminophen, HYDROmorphone (DILAUDID) injection, ibuprofen, iohexol, lip balm, ondansetron **OR** ondansetron (ZOFRAN) IV, oxyCODONE, phenol, sodium chloride flush      Subjective:   Shizuye Rupert was seen and examined today.  Seen at the bedside this morning, lethargic, febrile, tachypneic and tachycardiac.  Difficulty eating.  Objective:   Vitals:   11/25/18 1108 11/25/18 1200 11/25/18 1230 11/25/18 1231  BP: (!) 150/74 (!) 145/66  131/61  Pulse: (!) 112 (!) 116 (!) 118 (!) 117  Resp: (!) 30 [place])  39 (!) 36 (!) 38  Temp: 98.3 F (36.8 C) 98.4 F (36.9 C)  100 F (37.8 C)  TempSrc: Oral Oral    SpO2: 98% 96% 97% 99%  Weight:      Height:        Intake/Output Summary (Last 24 hours) at 11/25/2018 1233 Last data filed at 11/25/2018 1232 Gross per 24 hour  Intake 1109.41 ml  Output 1100 ml  Net 9.41 ml     Wt Readings from Last 3 Encounters:  11/24/18 74.8 kg  06/27/18 78.4 kg  05/14/18 79.4 kg    Physical Exam  General: Lethargic, follows commands, ill-appearing  Eyes:  HEENT:  Oral thrush+  Cardiovascular: S1 S2 clear,  RRR. No pedal edema b/l  Respiratory: Bilateral rhonchi  Gastrointestinal: Soft, nontender, nondistended, NBS  Ext: no pedal edema bilaterally  Neuro: no new deficits  Musculoskeletal: No cyanosis, clubbing  Skin: No rashes  Psych: Ill-appearing      Data Reviewed:  I have personally reviewed following labs and imaging studies  Micro Results Recent Results (from the past 240 hour(s))  Culture, blood (routine x 2)     Status: None (Preliminary  result)   Collection Time: 11/21/18  2:51 PM  Result Value Ref Range Status   Specimen Description BLOOD LEFT ANTECUBITAL  Final   Special Requests   Final    BOTTLES DRAWN AEROBIC AND ANAEROBIC Blood Culture adequate volume Performed at [facility] [address], [place], [place] [address]    Culture NO GROWTH 4 DAYS  Final   Report Status PENDING  Incomplete  Culture, blood (routine x 2)     Status: None (Preliminary result)   Collection Time: 11/21/18  2:59 PM  Result Value Ref Range Status   Specimen Description BLOOD RIGHT ANTECUBITAL  Final   Special Requests   Final    BOTTLES DRAWN AEROBIC ONLY Blood Culture adequate volume Performed at [facility] [address] [place], [place] [address]    Culture NO GROWTH 4 DAYS  Final   Report Status PENDING  Incomplete    Radiology Reports Dg Chest 2 View  Result Date: 11/21/2018 CLINICAL DATA:  History of lymphoma.  Patient weak and flushed. EXAM: CHEST - 2 VIEW COMPARISON:  Chest CT, 11/18/2018.  Chest radiographs, 11/11/2018. FINDINGS: Mild enlargement of the cardiopericardial silhouette. No mediastinal or hilar masses. No convincing adenopathy. Small bilateral pleural effusions. Mild dependent lower lobe atelectasis. Lungs otherwise clear. No pneumothorax. Right anterior chest wall, internal jugular, Port-A-Cath is stable from the prior chest CT. Skeletal structures are unremarkable. IMPRESSION: 1. Findings are similar to the recent prior chest CT. Enlargement of the cardiopericardial silhouette is consistent with the pericardial effusion noted on CT. There are small bilateral pleural effusions with associated lower lobe atelectasis. No convincing pneumonia and no pulmonary edema. Electronically Signed   By: Lajean Manes M.D.   On: 11/21/2018 15:05   Ct Soft Tissue Neck W Contrast  Result Date: 11/18/2018 CLINICAL DATA:  Initial evaluation for Hodgkin's lymphoma, initial workup. EXAM: CT  NECK WITH CONTRAST CT CHEST, ABDOMEN, AND PELVIS WITH CONTRAST TECHNIQUE: Multidetector CT imaging of the chest, abdomen and pelvis was performed following the standard protocol during bolus administration of intravenous contrast. CONTRAST:  138m OMNIPAQUE IOHEXOL 300 MG/ML SOLN, 339mOMNIPAQUE IOHEXOL 300 MG/ML SOLN COMPARISON:  None. FINDINGS: CT NECK FINDINGS: Pharynx and larynx: Oral cavity within normal limits without mass lesion or loculated collection. Patient is largely edentulous. Calcified tonsilliths  noted within the right palatine tonsil. Tonsils themselves symmetric and within normal limits bilaterally. Parapharyngeal fat maintained. Nasopharynx normal. No retropharyngeal collection. Epiglottis normal. Vallecula clear. Remainder of the hypopharynx and supraglottic larynx normal. Glottis within normal limits. Subglottic airway clear. Salivary glands: Parotid and submandibular glands within normal limits. Thyroid: Thyroid diffusely enlarged without discrete nodule or mass. Lymph nodes: Mildly prominent level II lymph nodes measure up to 9 mm in short axis bilaterally. Left level III nodes measure up to 9 mm as well. Increased number of shotty subcentimeter nodes within the neck bilaterally, left slightly worse than right. No other pathologically enlarged lymph nodes identified within the neck. Vascular: Normal intravascular enhancement seen throughout the neck. Right IJ approach central venous catheter in place. Soft tissue swelling with stranding and scattered foci of soft tissue emphysema within the right neck likely related to central line placement. Limited intracranial: Unremarkable Visualized orbits: Partially visualized inferior globes and orbits unremarkable. Mastoids and visualized paranasal sinuses: Mucosal thickening noted within the visualized maxillary and sphenoid sinuses. Visualized paranasal sinuses are otherwise clear. Visualize mastoids and middle ear cavities are clear. Skeleton: No  acute osseous abnormality. No discrete lytic or blastic osseous lesions. Mild cervical spondylolysis present at C5-6. Other: None. CT CHEST FINDINGS Cardiovascular: Normal intravascular enhancement seen throughout the intra-abdominal aorta without aneurysm or other acute abnormality. Minimal atheromatous plaque within the aortic arch. Visualized great vessels within normal limits. Heart size normal. Small moderate pericardial effusion, simple fluid density. Mediastinum/Nodes: Enlarged nodes within the upper mediastinum measure up to 15 mm in short axis (series 3, image 11). No appreciable supraclavicular adenopathy. No other pathologically enlarged mediastinal lymph nodes. Mild soft tissue density within the right hilum without frank adenopathy. Mildly enlarged 11 mm left hilar node (series 3, image 28). Enlarged axillary adenopathy seen bilaterally, with the largest node on the left measuring 2.1 cm in short axis (series 3, image 16). Largest node on the right measures 1.7 cm in short axis (series 3, image 8). Scattered soft tissue stranding with emphysema and fluid density overlying the left axilla likely related to recent lymph node biopsy, partially visualized. Esophagus within normal limits. Lungs/Pleura: Tracheobronchial tree intact and patent. Moderate layering bilateral pleural effusions with associated atelectasis. No other airspace consolidation. No pulmonary edema. No pneumothorax. 4 mm subpleural ground-glass nodule present at the anterior right upper lobe (series 4, image 57), indeterminate. No other worrisome pulmonary nodule or mass. Musculoskeletal: No acute osseous finding. No discrete lytic or blastic osseous lesions. CT ABDOMEN PELVIS FINDINGS Hepatobiliary: Liver demonstrates a normal contrast enhanced appearance. Gallbladder within normal limits. No biliary dilatation. Pancreas: Pancreas within normal limits. Spleen: Spleen enlarged measuring 14.3 cm in craniocaudad dimension. Few subtle  hypodensities noted within the spleen, largest of which measures approximately 12 mm (series 3, image 57), indeterminate. Adrenals/Urinary Tract: Adrenal glands are normal. Kidneys equal in size with symmetric enhancement. No nephrolithiasis, hydronephrosis, or focal enhancing renal mass. No hydroureter. Bladder within normal limits. Stomach/Bowel: Stomach within normal limits. No evidence for bowel obstruction. Normal appendix. No acute inflammatory changes seen about the bowels. Vascular/Lymphatic: Normal intravascular enhancement seen throughout the intra-abdominal aorta. Mild aorto bi-iliac atherosclerotic disease. No aneurysm. Mesenteric vessels patent proximally. Enlarged 15 mm aortocaval lymph node (series 3, image 7). Additional multifocal shotty subcentimeter aortocaval and periaortic lymph nodes noted. No other pathologically enlarged intra-abdominal lymph nodes identified. Mildly prominent 12 mm left inguinal lymph nodes noted. No other adenopathy within the pelvis. Reproductive: Uterus within normal limits.  Ovaries normal. Other: Moderate  volume free fluid within the pelvis, measuring simple fluid density. No free intraperitoneal air. Musculoskeletal: Scattered anasarca noted within the external soft tissues. No acute osseous finding. No discrete lytic or blastic osseous lesions. IMPRESSION: CT NECK IMPRESSION 1. Increased number of shoddy subcentimeter lymph nodes throughout the neck bilaterally, left slightly worse than right. No pathologically enlarged lymph nodes identified. 2. Mild diffuse thyromegaly without discrete thyroid nodule or mass. 3. Mild soft tissue stranding with emphysema within the right lateral neck related to recent right-sided Port-A-Cath placement. CT CHEST IMPRESSION 1. Enlarged bilateral axillary and upper mediastinal adenopathy as above, likely related to provided history of lymphoma. Additional borderline enlarged left hilar node may be related to lymphoma or possibly  reactive in nature. Attention at follow-up recommended. 2. Moderate layering bilateral pleural effusions with associated atelectasis. 3. Small to moderate pericardial effusion. 4. 4 mm right upper lobe ground-glass nodule, indeterminate. Attention at follow-up recommended. CT ABDOMEN AND PELVIS IMPRESSION 1. Enlarged 15 mm aortocaval lymph node with mildly enlarged 12 mm left inguinal lymph nodes. Additional increased number of shotty subcentimeter retroperitoneal lymph nodes. Findings most likely related to history of lymphoma. 2. Splenomegaly. 3. Moderate volume free fluid within the pelvis, of uncertain etiology, but could be physiologic and/or related overall volume status. 4. Mild diffuse anasarca. Electronically Signed   By: Jeannine Boga M.D.   On: 11/18/2018 19:56   Ct Chest W Contrast  Result Date: 11/18/2018 CLINICAL DATA:  Initial evaluation for Hodgkin's lymphoma, initial workup. EXAM: CT NECK WITH CONTRAST CT CHEST, ABDOMEN, AND PELVIS WITH CONTRAST TECHNIQUE: Multidetector CT imaging of the chest, abdomen and pelvis was performed following the standard protocol during bolus administration of intravenous contrast. CONTRAST:  143m OMNIPAQUE IOHEXOL 300 MG/ML SOLN, 385mOMNIPAQUE IOHEXOL 300 MG/ML SOLN COMPARISON:  None. FINDINGS: CT NECK FINDINGS: Pharynx and larynx: Oral cavity within normal limits without mass lesion or loculated collection. Patient is largely edentulous. Calcified tonsilliths noted within the right palatine tonsil. Tonsils themselves symmetric and within normal limits bilaterally. Parapharyngeal fat maintained. Nasopharynx normal. No retropharyngeal collection. Epiglottis normal. Vallecula clear. Remainder of the hypopharynx and supraglottic larynx normal. Glottis within normal limits. Subglottic airway clear. Salivary glands: Parotid and submandibular glands within normal limits. Thyroid: Thyroid diffusely enlarged without discrete nodule or mass. Lymph nodes: Mildly  prominent level II lymph nodes measure up to 9 mm in short axis bilaterally. Left level III nodes measure up to 9 mm as well. Increased number of shotty subcentimeter nodes within the neck bilaterally, left slightly worse than right. No other pathologically enlarged lymph nodes identified within the neck. Vascular: Normal intravascular enhancement seen throughout the neck. Right IJ approach central venous catheter in place. Soft tissue swelling with stranding and scattered foci of soft tissue emphysema within the right neck likely related to central line placement. Limited intracranial: Unremarkable Visualized orbits: Partially visualized inferior globes and orbits unremarkable. Mastoids and visualized paranasal sinuses: Mucosal thickening noted within the visualized maxillary and sphenoid sinuses. Visualized paranasal sinuses are otherwise clear. Visualize mastoids and middle ear cavities are clear. Skeleton: No acute osseous abnormality. No discrete lytic or blastic osseous lesions. Mild cervical spondylolysis present at C5-6. Other: None. CT CHEST FINDINGS Cardiovascular: Normal intravascular enhancement seen throughout the intra-abdominal aorta without aneurysm or other acute abnormality. Minimal atheromatous plaque within the aortic arch. Visualized great vessels within normal limits. Heart size normal. Small moderate pericardial effusion, simple fluid density. Mediastinum/Nodes: Enlarged nodes within the upper mediastinum measure up to 15 mm in short axis (series  3, image 11). No appreciable supraclavicular adenopathy. No other pathologically enlarged mediastinal lymph nodes. Mild soft tissue density within the right hilum without frank adenopathy. Mildly enlarged 11 mm left hilar node (series 3, image 28). Enlarged axillary adenopathy seen bilaterally, with the largest node on the left measuring 2.1 cm in short axis (series 3, image 16). Largest node on the right measures 1.7 cm in short axis (series 3, image  8). Scattered soft tissue stranding with emphysema and fluid density overlying the left axilla likely related to recent lymph node biopsy, partially visualized. Esophagus within normal limits. Lungs/Pleura: Tracheobronchial tree intact and patent. Moderate layering bilateral pleural effusions with associated atelectasis. No other airspace consolidation. No pulmonary edema. No pneumothorax. 4 mm subpleural ground-glass nodule present at the anterior right upper lobe (series 4, image 57), indeterminate. No other worrisome pulmonary nodule or mass. Musculoskeletal: No acute osseous finding. No discrete lytic or blastic osseous lesions. CT ABDOMEN PELVIS FINDINGS Hepatobiliary: Liver demonstrates a normal contrast enhanced appearance. Gallbladder within normal limits. No biliary dilatation. Pancreas: Pancreas within normal limits. Spleen: Spleen enlarged measuring 14.3 cm in craniocaudad dimension. Few subtle hypodensities noted within the spleen, largest of which measures approximately 12 mm (series 3, image 57), indeterminate. Adrenals/Urinary Tract: Adrenal glands are normal. Kidneys equal in size with symmetric enhancement. No nephrolithiasis, hydronephrosis, or focal enhancing renal mass. No hydroureter. Bladder within normal limits. Stomach/Bowel: Stomach within normal limits. No evidence for bowel obstruction. Normal appendix. No acute inflammatory changes seen about the bowels. Vascular/Lymphatic: Normal intravascular enhancement seen throughout the intra-abdominal aorta. Mild aorto bi-iliac atherosclerotic disease. No aneurysm. Mesenteric vessels patent proximally. Enlarged 15 mm aortocaval lymph node (series 3, image 7). Additional multifocal shotty subcentimeter aortocaval and periaortic lymph nodes noted. No other pathologically enlarged intra-abdominal lymph nodes identified. Mildly prominent 12 mm left inguinal lymph nodes noted. No other adenopathy within the pelvis. Reproductive: Uterus within normal  limits.  Ovaries normal. Other: Moderate volume free fluid within the pelvis, measuring simple fluid density. No free intraperitoneal air. Musculoskeletal: Scattered anasarca noted within the external soft tissues. No acute osseous finding. No discrete lytic or blastic osseous lesions. IMPRESSION: CT NECK IMPRESSION 1. Increased number of shoddy subcentimeter lymph nodes throughout the neck bilaterally, left slightly worse than right. No pathologically enlarged lymph nodes identified. 2. Mild diffuse thyromegaly without discrete thyroid nodule or mass. 3. Mild soft tissue stranding with emphysema within the right lateral neck related to recent right-sided Port-A-Cath placement. CT CHEST IMPRESSION 1. Enlarged bilateral axillary and upper mediastinal adenopathy as above, likely related to provided history of lymphoma. Additional borderline enlarged left hilar node may be related to lymphoma or possibly reactive in nature. Attention at follow-up recommended. 2. Moderate layering bilateral pleural effusions with associated atelectasis. 3. Small to moderate pericardial effusion. 4. 4 mm right upper lobe ground-glass nodule, indeterminate. Attention at follow-up recommended. CT ABDOMEN AND PELVIS IMPRESSION 1. Enlarged 15 mm aortocaval lymph node with mildly enlarged 12 mm left inguinal lymph nodes. Additional increased number of shotty subcentimeter retroperitoneal lymph nodes. Findings most likely related to history of lymphoma. 2. Splenomegaly. 3. Moderate volume free fluid within the pelvis, of uncertain etiology, but could be physiologic and/or related overall volume status. 4. Mild diffuse anasarca. Electronically Signed   By: Jeannine Boga M.D.   On: 11/18/2018 19:56   Ct Abdomen Pelvis W Contrast  Result Date: 11/18/2018 CLINICAL DATA:  Initial evaluation for Hodgkin's lymphoma, initial workup. EXAM: CT NECK WITH CONTRAST CT CHEST, ABDOMEN, AND PELVIS WITH CONTRAST  TECHNIQUE: Multidetector CT imaging  of the chest, abdomen and pelvis was performed following the standard protocol during bolus administration of intravenous contrast. CONTRAST:  159m OMNIPAQUE IOHEXOL 300 MG/ML SOLN, 380mOMNIPAQUE IOHEXOL 300 MG/ML SOLN COMPARISON:  None. FINDINGS: CT NECK FINDINGS: Pharynx and larynx: Oral cavity within normal limits without mass lesion or loculated collection. Patient is largely edentulous. Calcified tonsilliths noted within the right palatine tonsil. Tonsils themselves symmetric and within normal limits bilaterally. Parapharyngeal fat maintained. Nasopharynx normal. No retropharyngeal collection. Epiglottis normal. Vallecula clear. Remainder of the hypopharynx and supraglottic larynx normal. Glottis within normal limits. Subglottic airway clear. Salivary glands: Parotid and submandibular glands within normal limits. Thyroid: Thyroid diffusely enlarged without discrete nodule or mass. Lymph nodes: Mildly prominent level II lymph nodes measure up to 9 mm in short axis bilaterally. Left level III nodes measure up to 9 mm as well. Increased number of shotty subcentimeter nodes within the neck bilaterally, left slightly worse than right. No other pathologically enlarged lymph nodes identified within the neck. Vascular: Normal intravascular enhancement seen throughout the neck. Right IJ approach central venous catheter in place. Soft tissue swelling with stranding and scattered foci of soft tissue emphysema within the right neck likely related to central line placement. Limited intracranial: Unremarkable Visualized orbits: Partially visualized inferior globes and orbits unremarkable. Mastoids and visualized paranasal sinuses: Mucosal thickening noted within the visualized maxillary and sphenoid sinuses. Visualized paranasal sinuses are otherwise clear. Visualize mastoids and middle ear cavities are clear. Skeleton: No acute osseous abnormality. No discrete lytic or blastic osseous lesions. Mild cervical spondylolysis  present at C5-6. Other: None. CT CHEST FINDINGS Cardiovascular: Normal intravascular enhancement seen throughout the intra-abdominal aorta without aneurysm or other acute abnormality. Minimal atheromatous plaque within the aortic arch. Visualized great vessels within normal limits. Heart size normal. Small moderate pericardial effusion, simple fluid density. Mediastinum/Nodes: Enlarged nodes within the upper mediastinum measure up to 15 mm in short axis (series 3, image 11). No appreciable supraclavicular adenopathy. No other pathologically enlarged mediastinal lymph nodes. Mild soft tissue density within the right hilum without frank adenopathy. Mildly enlarged 11 mm left hilar node (series 3, image 28). Enlarged axillary adenopathy seen bilaterally, with the largest node on the left measuring 2.1 cm in short axis (series 3, image 16). Largest node on the right measures 1.7 cm in short axis (series 3, image 8). Scattered soft tissue stranding with emphysema and fluid density overlying the left axilla likely related to recent lymph node biopsy, partially visualized. Esophagus within normal limits. Lungs/Pleura: Tracheobronchial tree intact and patent. Moderate layering bilateral pleural effusions with associated atelectasis. No other airspace consolidation. No pulmonary edema. No pneumothorax. 4 mm subpleural ground-glass nodule present at the anterior right upper lobe (series 4, image 57), indeterminate. No other worrisome pulmonary nodule or mass. Musculoskeletal: No acute osseous finding. No discrete lytic or blastic osseous lesions. CT ABDOMEN PELVIS FINDINGS Hepatobiliary: Liver demonstrates a normal contrast enhanced appearance. Gallbladder within normal limits. No biliary dilatation. Pancreas: Pancreas within normal limits. Spleen: Spleen enlarged measuring 14.3 cm in craniocaudad dimension. Few subtle hypodensities noted within the spleen, largest of which measures approximately 12 mm (series 3, image 57),  indeterminate. Adrenals/Urinary Tract: Adrenal glands are normal. Kidneys equal in size with symmetric enhancement. No nephrolithiasis, hydronephrosis, or focal enhancing renal mass. No hydroureter. Bladder within normal limits. Stomach/Bowel: Stomach within normal limits. No evidence for bowel obstruction. Normal appendix. No acute inflammatory changes seen about the bowels. Vascular/Lymphatic: Normal intravascular enhancement seen throughout the intra-abdominal aorta.  Mild aorto bi-iliac atherosclerotic disease. No aneurysm. Mesenteric vessels patent proximally. Enlarged 15 mm aortocaval lymph node (series 3, image 7). Additional multifocal shotty subcentimeter aortocaval and periaortic lymph nodes noted. No other pathologically enlarged intra-abdominal lymph nodes identified. Mildly prominent 12 mm left inguinal lymph nodes noted. No other adenopathy within the pelvis. Reproductive: Uterus within normal limits.  Ovaries normal. Other: Moderate volume free fluid within the pelvis, measuring simple fluid density. No free intraperitoneal air. Musculoskeletal: Scattered anasarca noted within the external soft tissues. No acute osseous finding. No discrete lytic or blastic osseous lesions. IMPRESSION: CT NECK IMPRESSION 1. Increased number of shoddy subcentimeter lymph nodes throughout the neck bilaterally, left slightly worse than right. No pathologically enlarged lymph nodes identified. 2. Mild diffuse thyromegaly without discrete thyroid nodule or mass. 3. Mild soft tissue stranding with emphysema within the right lateral neck related to recent right-sided Port-A-Cath placement. CT CHEST IMPRESSION 1. Enlarged bilateral axillary and upper mediastinal adenopathy as above, likely related to provided history of lymphoma. Additional borderline enlarged left hilar node may be related to lymphoma or possibly reactive in nature. Attention at follow-up recommended. 2. Moderate layering bilateral pleural effusions with  associated atelectasis. 3. Small to moderate pericardial effusion. 4. 4 mm right upper lobe ground-glass nodule, indeterminate. Attention at follow-up recommended. CT ABDOMEN AND PELVIS IMPRESSION 1. Enlarged 15 mm aortocaval lymph node with mildly enlarged 12 mm left inguinal lymph nodes. Additional increased number of shotty subcentimeter retroperitoneal lymph nodes. Findings most likely related to history of lymphoma. 2. Splenomegaly. 3. Moderate volume free fluid within the pelvis, of uncertain etiology, but could be physiologic and/or related overall volume status. 4. Mild diffuse anasarca. Electronically Signed   By: Jeannine Boga M.D.   On: 11/18/2018 19:56   Ct Biopsy  Result Date: 11/15/2018 INDICATION: Pancytopenia, concern for lymphoproliferative process EXAM: CT GUIDED RIGHT ILIAC BONE MARROW ASPIRATION AND CORE BIOPSY Date:  11/15/2018 11/15/2018 10:13 am Radiologist:  M. Daryll Brod, MD Guidance:  CT FLUOROSCOPY TIME:  Fluoroscopy Time: None. MEDICATIONS: 1% lidocaine local ANESTHESIA/SEDATION: 2.0 mg IV Versed; 50 mcg IV Fentanyl Moderate Sedation Time:  10 minutes The patient was continuously monitored during the procedure by the interventional radiology nurse under my direct supervision. CONTRAST:  None. COMPLICATIONS: None PROCEDURE: Informed consent was obtained from the patient following explanation of the procedure, risks, benefits and alternatives. The patient understands, agrees and consents for the procedure. All questions were addressed. A time out was performed. The patient was positioned prone and non-contrast localization CT was performed of the pelvis to demonstrate the iliac marrow spaces. Maximal barrier sterile technique utilized including caps, mask, sterile gowns, sterile gloves, large sterile drape, hand hygiene, and Betadine prep. Under sterile conditions and local anesthesia, an 11 gauge coaxial bone biopsy needle was advanced into the right iliac marrow space. Needle  position was confirmed with CT imaging. Initially, bone marrow aspiration was performed. Next, the 11 gauge outer cannula was utilized to obtain a right iliac bone marrow core biopsy. Needle was removed. Hemostasis was obtained with compression. The patient tolerated the procedure well. Samples were prepared with the cytotechnologist. No immediate complications. IMPRESSION: CT guided right iliac bone marrow aspiration and core biopsy. Electronically Signed   By: Jerilynn Mages.  Shick M.D.   On: 11/15/2018 11:25   Dg Chest Port 1 View  Result Date: 11/11/2018 CLINICAL DATA:  Fever EXAM: PORTABLE CHEST 1 VIEW COMPARISON:  None. FINDINGS: Mild cardiomegaly. No focal consolidation or effusion. No pneumothorax. IMPRESSION: No active disease. Electronically  Signed   By: Donavan Foil M.D.   On: 11/11/2018 21:59   Ct Bone Marrow Biopsy & Aspiration  Result Date: 11/15/2018 INDICATION: Pancytopenia, concern for lymphoproliferative process EXAM: CT GUIDED RIGHT ILIAC BONE MARROW ASPIRATION AND CORE BIOPSY Date:  11/15/2018 11/15/2018 10:13 am Radiologist:  M. Daryll Brod, MD Guidance:  CT FLUOROSCOPY TIME:  Fluoroscopy Time: None. MEDICATIONS: 1% lidocaine local ANESTHESIA/SEDATION: 2.0 mg IV Versed; 50 mcg IV Fentanyl Moderate Sedation Time:  10 minutes The patient was continuously monitored during the procedure by the interventional radiology nurse under my direct supervision. CONTRAST:  None. COMPLICATIONS: None PROCEDURE: Informed consent was obtained from the patient following explanation of the procedure, risks, benefits and alternatives. The patient understands, agrees and consents for the procedure. All questions were addressed. A time out was performed. The patient was positioned prone and non-contrast localization CT was performed of the pelvis to demonstrate the iliac marrow spaces. Maximal barrier sterile technique utilized including caps, mask, sterile gowns, sterile gloves, large sterile drape, hand hygiene, and  Betadine prep. Under sterile conditions and local anesthesia, an 11 gauge coaxial bone biopsy needle was advanced into the right iliac marrow space. Needle position was confirmed with CT imaging. Initially, bone marrow aspiration was performed. Next, the 11 gauge outer cannula was utilized to obtain a right iliac bone marrow core biopsy. Needle was removed. Hemostasis was obtained with compression. The patient tolerated the procedure well. Samples were prepared with the cytotechnologist. No immediate complications. IMPRESSION: CT guided right iliac bone marrow aspiration and core biopsy. Electronically Signed   By: Jerilynn Mages.  Shick M.D.   On: 11/15/2018 11:25   Ir Imaging Guided Port Insertion  Result Date: 11/18/2018 INDICATION: 53 year old female with history lymphoma EXAM: IMPLANTED PORT A CATH PLACEMENT WITH ULTRASOUND AND FLUOROSCOPIC GUIDANCE MEDICATIONS: 2.0 g Ancef; The antibiotic was administered within an appropriate time interval prior to skin puncture. ANESTHESIA/SEDATION: Moderate (conscious) sedation was employed during this procedure. A total of Versed 2.0 mg and Fentanyl 100 mcg was administered intravenously. Moderate Sedation Time: 18 minutes. The patient's level of consciousness and vital signs were monitored continuously by radiology nursing throughout the procedure under my direct supervision. FLUOROSCOPY TIME:  0 minutes, 12 seconds (1.0 mGy) COMPLICATIONS: None PROCEDURE: The procedure, risks, benefits, and alternatives were explained to the patient. Questions regarding the procedure were encouraged and answered. The patient understands and consents to the procedure. Ultrasound survey was performed with images stored and sent to PACs. The right neck and chest was prepped with chlorhexidine, and draped in the usual sterile fashion using maximum barrier technique (cap and mask, sterile gown, sterile gloves, large sterile sheet, hand hygiene and cutaneous antiseptic). Antibiotic prophylaxis was  provided with 2.0g Ancef administered IV one hour prior to skin incision. Local anesthesia was attained by infiltration with 1% lidocaine without epinephrine. Ultrasound demonstrated patency of the right internal jugular vein, and this was documented with an image. Under real-time ultrasound guidance, this vein was accessed with a 21 gauge micropuncture needle and image documentation was performed. A small dermatotomy was made at the access site with an 11 scalpel. A 0.018" wire was advanced into the SVC and used to estimate the length of the internal catheter. The access needle exchanged for a 40F micropuncture vascular sheath. The 0.018" wire was then removed and a 0.035" wire advanced into the IVC. An appropriate location for the subcutaneous reservoir was selected below the clavicle and an incision was made through the skin and underlying soft tissues. The subcutaneous  tissues were then dissected using a combination of blunt and sharp surgical technique and a pocket was formed. A single lumen power injectable portacatheter was then tunneled through the subcutaneous tissues from the pocket to the dermatotomy and the port reservoir placed within the subcutaneous pocket. The venous access site was then serially dilated and a peel away vascular sheath placed over the wire. The wire was removed and the port catheter advanced into position under fluoroscopic guidance. The catheter tip is positioned in the cavoatrial junction. This was documented with a spot image. The portacatheter was then tested and found to flush and aspirate well. The port was flushed with saline followed by 100 units/mL heparinized saline. The pocket was then closed in two layers using first subdermal inverted interrupted absorbable sutures followed by a running subcuticular suture. The epidermis was then sealed with Dermabond. The dermatotomy at the venous access site was also seal with Dermabond. Patient tolerated the procedure well and remained  hemodynamically stable throughout. No complications encountered and no significant blood loss encountered IMPRESSION: Status post right IJ port catheter placement. Catheter ready for use. Signed, Dulcy Fanny. Dellia Nims, RPVI [facility] Electronically Signed   By: Corrie Mckusick D.O.   On: 11/18/2018 11:23    Lab Data:  CBC: Recent Labs  Lab 11/21/18 0619 11/22/18 0609 11/23/18 0610 11/24/18 0600 11/25/18 0554  WBC 18.2* 5.5 1.1* 0.5* 0.2*  NEUTROABS  --   --  0.9* 0.2*  --   HGB 8.4* 6.3* 9.2* 9.4* 9.1*  HCT 26.3* 20.1* 27.7* 29.6* 29.0*  MCV 85.9 87.4 86.3 88.9 89.0  PLT 68* 32* 26* 28* 19*   Basic Metabolic Panel: Recent Labs  Lab 11/21/18 0619 11/22/18 0609 11/23/18 0610 11/23/18 1000 11/23/18 1502 11/24/18 0634 11/25/18 0554  NA 138 138 138  --   --  140 142  K 3.5 3.1* 2.7*  --  3.1* 2.8* 2.8*  CL 108 109 104  --   --  105 109  CO2 18* 20* 21*  --   --  15* 14*  GLUCOSE 99 103* 91  --   --  80 129*  BUN _0 --   --  6 <5*  CREATININE 0.86 0.69 0.51  --   --  0.67 0.59  CALCIUM 7.3* 7.1* 7.3*  --   --  7.5* 6.8*  MG  --   --   --  2.0  --   --  1.9   GFR: Estimated Creatinine Clearance: 82 mL/min (by C-G formula based on SCr of 0.59 mg/dL). Liver Function Tests: Recent Labs  Lab 11/19/18 0805 11/23/18 0610 11/25/18 0920  AST 30 32  --   ALT 15 25  --   ALKPHOS 154* 126  --   BILITOT 1.2 1.2  --   PROT 5.1* 4.9*  --   ALBUMIN 1.7* 1.7* 2.1*   No results for input(s): LIPASE, AMYLASE in the last 168 hours. No results for input(s): AMMONIA in the last 168 hours. Coagulation Profile: No results for input(s): INR, PROTIME in the last 168 hours. Cardiac Enzymes: No results for input(s): CKTOTAL, CKMB, CKMBINDEX, TROPONINI in the last 168 hours. BNP (last 3 results) No results for input(s): PROBNP in the last 8760 hours. HbA1C: No results for input(s): HGBA1C in the last 72 hours. CBG: Recent  Labs  Lab 11/20/18 2153  GLUCAP 92   Lipid Profile: No results for input(s): CHOL, HDL, LDLCALC, TRIG, CHOLHDL,  LDLDIRECT in the last 72 hours. Thyroid Function Tests: No results for input(s): TSH, T4TOTAL, FREET4, T3FREE, THYROIDAB in the last 72 hours. Anemia Panel: No results for input(s): VITAMINB12, FOLATE, FERRITIN, TIBC, IRON, RETICCTPCT in the last 72 hours. Urine analysis:    Component Value Date/Time   COLORURINE YELLOW 11/14/2018 1619   APPEARANCEUR HAZY (A) 11/14/2018 1619   LABSPEC 1.016 11/14/2018 1619   PHURINE 5.0 11/14/2018 1619   GLUCOSEU NEGATIVE 11/14/2018 1619   HGBUR SMALL (A) 11/14/2018 1619   BILIRUBINUR NEGATIVE 11/14/2018 1619   KETONESUR NEGATIVE 11/14/2018 1619   PROTEINUR 30 (A) 11/14/2018 1619   NITRITE NEGATIVE 11/14/2018 1619   LEUKOCYTESUR NEGATIVE 11/14/2018 1619     Ripudeep Rai M.D. [facility] 11/25/2018, 12:33 PM  Pager: [phone] Between 7am to 7pm - call Pager - [phone]  After 7pm go to [email or web address] - password [email or web address]  Call night coverage person covering after 7pm

## 2018-11-25 NOTE — Progress Notes (Signed)
Rapid Response called to beside. Upon arrival, MD Rai at bedside and ordered patient to be transferred to SDU. Patient tachycardic-110s-120s, tachypnea (RR-30s), fever. Will transfer to SDU for closer monitoring.

## 2018-11-25 NOTE — Progress Notes (Signed)
   11/25/18 1000  Clinical Encounter Type  Visited With Family  Visit Type Initial;Psychological support;Spiritual support;Critical Care  Referral From Family  Consult/Referral To Chaplain  Spiritual Encounters  Spiritual Needs Emotional;Other (Comment) (Spiritual Care Conversation/Support)  Stress Factors  Patient Stress Factors Not reviewed  Family Stress Factors Health changes    I visited with the patient's daughter and her daughter's mother-in-law per request from the two of them. I provided spiritual support and emotional support.    Please, contact Spiritual Care for further assistance.   Chaplain Shanon Ace M.Div.,

## 2018-11-25 NOTE — Progress Notes (Signed)
Pharmacy Antibiotic Note  Cindy Ward is a 53 y.o. female admitted on 11/11/2018 with FN.  Pharmacy has been consulted for vanc/cefepime dosing.  Plan: 1) Cefepime 2g IV q8 2) Vancomycin 1500mg  x 1 then 1g IV q12 - goal AUC 400-550  Height: 5\' 8"  (172.7 cm) Weight: 165 lb (74.8 kg) IBW/kg (Calculated) : 63.9  Temp (24hrs), Avg:99.8 F (37.7 C), Min:98.4 F (36.9 C), Max:101.5 F (38.6 C)  Recent Labs  Lab 11/21/18 0619 11/22/18 0609 11/23/18 0610 11/24/18 0600 11/24/18 0634 11/25/18 0554  WBC 18.2* 5.5 1.1* 0.5*  --  0.2*  CREATININE 0.86 0.69 0.51  --  0.67 0.59    Estimated Creatinine Clearance: 82 mL/min (by C-G formula based on SCr of 0.59 mg/dL).    No Known Allergies   Thank you for allowing pharmacy to be a part of this patient's care.  Kara Mead 11/25/2018 8:48 AM

## 2018-11-25 NOTE — Progress Notes (Signed)
Nutrition Follow-up  DOCUMENTATION CODES:   Non-severe (moderate) malnutrition in context of chronic illness  INTERVENTION:  - Continue Boost Breeze but will decrease to once/day. - Continue Unjury chicken soup BID. - Re-advance diet as medically feasible. - Continue to encourage PO intakes.    NUTRITION DIAGNOSIS:   Moderate Malnutrition related to chronic illness, nausea, vomiting, other (see comment)(night sweats) as evidenced by mild fat depletion, mild muscle depletion, moderate muscle depletion. -ongoing  GOAL:   Patient will meet greater than or equal to 90% of their needs -unmet  MONITOR:   PO intake, Supplement acceptance, Diet advancement, Weight trends, Labs, Skin  ASSESSMENT:   53 y.o. female with medical history significant of HTN and hypothyroidism. She presented at [facility] with recurrent N/V, fevers, and night sweats of 9 months duration. She notes that the symptoms occur about every 2 weeks. She has nightly fevers and night sweats.  The symptoms occur for days at a time, but have been taking longer and longer to resolve. She presented to the ED after getting dizzy and passing out at the [facility]. She was noted to have a fever of 102.7. Multiple enlarged lymph nodes noted on CT abdomen and pelvis.  Weight has been fluctuating over the past 10-12 days. No recent PO intakes documented. Patient was changed from Regular diet to CLD today at 8:35 AM. She talks very quietly and has apparent pain with talking (patient has thrush) so RD visit and questioning was short to be mindful of her comfort. She reports consuming a banana, applesauce, and another item (she is unable to recall) yesterday. She does indicate increased pain with swallowing. No nausea but is having abdominal pain this AM.   She has been accepting Boost Breeze ~25% of the time offered and Unjury ~50% of the time offered. Noted a 6-pack of Pediasure in patient's room. Unable to ask about this at  this time (if she prefers this supplement, would like a supplement such Ensure ordered).  Per Dr. Josem Kaufmann note yesterday AM: newly dx stage 4 Hodgkin's lymphoma with port placed 2/24 and started chemo on 2/25, pancytopenia with neutropenia, thrombocytopenia, and anemia, severe PCM, small pericardial effusion, generalized debility. Oncology note this AM states patient has developed thrush and diarrhea.      Medications reviewed; 37.5 mcg IV synthroid/day, daily multivitamin with minerals, 5 ml mycostatin QID, 10 mEq IV KCl x3 runs 3/1 and x4 runs 3/2. Labs reviewed; K: 2.8 mmol/l, BUN: <5 mg/dl, Ca: 6.8 mg/dl.  IVF; D5-NS-20 mEq KCl @ 75 ml/hr (306 kcal)    Diet Order:   Diet Order            Diet clear liquid Room service appropriate? Yes; Fluid consistency: Thin  Diet effective now              EDUCATION NEEDS:   Education needs have been addressed  Skin:  Skin Assessment: Skin Integrity Issues: Skin Integrity Issues:: Incisions Incisions: L axilla (2/18)  Last BM:  3/2  Height:   Ht Readings from Last 1 Encounters:  11/11/18 5\' 8"  (1.727 m)    Weight:   Wt Readings from Last 1 Encounters:  11/24/18 74.8 kg    Ideal Body Weight:  63.64 kg  BMI:  Body mass index is 25.09 kg/m.  Estimated Nutritional Needs:   Kcal:  2100-2300 kcal  Protein:  100-115 grams  Fluid:  >/= 2.1 L/day     Jarome Matin, MS, RD, LDN, CNSC Inpatient Clinical Dietitian Pager #  [phone] After hours/weekend pager # [phone]

## 2018-11-25 NOTE — Progress Notes (Signed)
BiPAP order D/C'd at this time per PCCM MD.

## 2018-11-25 NOTE — Progress Notes (Signed)
[place] INPATIENT PROGRESS NOTE  SUBJECTIVE: Cindy Ward 53 y.o. female with Stage IV Hodgkin lymphoma. Received Day 1 Cycle 1 AVD+ Adcedrison 11/19/2018. Had fever overnight to 101.5. Noted to be more lethargic and could not swallow Tylenol. Transferred to step down unit for closer monitoring. BC X 2 drawn this am. Platelet transfusion ordered. Has developed thrush. Denies chest pain and shortness of breath. Has abdominal discomfort and diarrhea. Small amount of blood noted with BM. No nausea or vomiting.   ALLERGIES:  has No Known Allergies.  MEDICATIONS:  Current Facility-Administered Medications  Medication Dose Route Frequency Provider Last Rate Last Dose  . 0.9 %  sodium chloride infusion (Manually program via Guardrails IV Fluids)   Intravenous Once Rai, Ripudeep K, MD      . 0.9 %  sodium chloride infusion   Intravenous PRN Rai, Ripudeep K, MD 10 mL/hr at 11/24/18 1317 250 mL at 11/24/18 1317  . 0.9 %  sodium chloride infusion   Intravenous Once Rai, Ripudeep K, MD      . acetaminophen (TYLENOL) tablet 650 mg  650 mg Oral Q6H PRN Rai, Ripudeep K, MD   650 mg at 11/25/18 0811   Or  . acetaminophen (TYLENOL) suppository 650 mg  650 mg Rectal Q6H PRN Rai, Ripudeep K, MD   650 mg at 11/25/18 0852  . ceFEPIme (MAXIPIME) 2 g in sodium chloride 0.9 % 100 mL IVPB  2 g Intravenous Q8H Adrian Saran, [facility]      . dextrose 5 % and 0.45 % NaCl with KCl 20 mEq/L infusion   Intravenous Continuous Rai, Ripudeep K, MD      . feeding supplement (BOOST / RESOURCE BREEZE) liquid 1 Container  1 Container Oral TID BM Rai, Ripudeep K, MD   1 Container at 11/23/18 1021  . fluconazole (DIFLUCAN) IVPB 200 mg  200 mg Intravenous Q24H Rai, Ripudeep K, MD      . HYDROmorphone (DILAUDID) injection 0.5-1 mg  0.5-1 mg Intravenous Q4H PRN Rai, Ripudeep K, MD      . ibuprofen (ADVIL,MOTRIN) tablet 400 mg  400 mg Oral Q8H PRN Rai, Ripudeep K, MD   400 mg at 11/18/18 2334  . iohexol (OMNIPAQUE)  300 MG/ML solution 15 mL  15 mL Oral Once PRN Rai, Ripudeep K, MD   30 mL at 11/18/18 1740  . levothyroxine (SYNTHROID, LEVOTHROID) injection 37.5 mcg  37.5 mcg Intravenous Daily Rai, Ripudeep K, MD      . lip balm (CARMEX) ointment   Topical PRN Rai, Ripudeep K, MD      . MEDLINE mouth rinse  15 mL Mouth Rinse BID Rai, Ripudeep K, MD   15 mL at 11/24/18 1105  . multivitamin with minerals tablet 1 tablet  1 tablet Oral Daily Rai, Ripudeep K, MD   1 tablet at 11/24/18 1105  . nystatin (MYCOSTATIN) 100000 UNIT/ML suspension 500,000 Units  5 mL Oral QID Rai, Ripudeep K, MD      . ondansetron (ZOFRAN) tablet 4 mg  4 mg Oral Q6H PRN Rai, Ripudeep K, MD       Or  . ondansetron (ZOFRAN) injection 4 mg  4 mg Intravenous Q6H PRN Rai, Ripudeep K, MD   4 mg at 11/24/18 1149  . oxyCODONE (Oxy IR/ROXICODONE) immediate release tablet 5-10 mg  5-10 mg Oral Q4H PRN Rai, Ripudeep K, MD   5 mg at 11/22/18 0130  . polyethylene glycol (MIRALAX / GLYCOLAX) packet 17 g  17 g  Oral Daily PRN Rai, Ripudeep K, MD      . potassium chloride 10 mEq in 100 mL IVPB  10 mEq Intravenous Q1 Hr x 4 Rai, Ripudeep K, MD      . protein supplement (UNJURY CHICKEN SOUP) powder 8 oz  8 oz Oral q12n4p Rai, Ripudeep K, MD   8 oz at 11/23/18 1022  . scopolamine (TRANSDERM-SCOP) 1 MG/3DAYS 1.5 mg  1 patch Transdermal Q72H Rai, Ripudeep K, MD   1.5 mg at 11/24/18 1327  . sodium chloride flush (NS) 0.9 % injection 10-40 mL  10-40 mL Intracatheter PRN Rai, Ripudeep K, MD      . Tbo-Filgrastim [facility]) injection 480 mcg  480 mcg Subcutaneous q1800 Tish Men, MD   480 mcg at 11/24/18 1713  . Tbo-Filgrastim [facility]) injection 480 mcg  480 mcg Subcutaneous q1800 Tish Men, MD      . vancomycin [facility]) 1,500 mg in sodium chloride 0.9 % 500 mL IVPB  1,500 mg Intravenous Once Adrian Saran, [facility] [place]      . vancomycin (VANCOCIN) IVPB 1000 mg/200 mL premix  1,000 mg Intravenous Q12H Adrian Saran, RPH        REVIEW OF SYSTEMS:   Review of Systems   Review of Systems  Constitutional: Positive for chills, fever, malaise/fatigue and weight loss.  HENT: Positive for sore throat.        Mucositis and oral thrush.  Eyes: Negative.   Respiratory: Negative.   Cardiovascular: Positive for leg swelling. Negative for chest pain.  Gastrointestinal: Positive for abdominal pain and diarrhea. Negative for nausea and vomiting.  Genitourinary: Negative.   Musculoskeletal: Negative.   Skin: Negative.   Neurological: Negative.   Endo/Heme/Allergies:       Bruises easily.   Psychiatric/Behavioral: Negative.       PHYSICAL EXAMINATION:  Vital signs in last 24 hours: Temp:  [98.4 F (36.9 C)-101.5 F (38.6 C)] 101.1 F (38.4 C) (03/02 0900) Pulse Rate:  [102-118] 118 (03/02 0824) Resp:  [16-20] 16 (03/02 0824) BP: (128-142)/(76-88) 131/83 (03/02 0824) SpO2:  [94 %-98 %] 97 % (03/02 0824) Weight change:  Last BM Date: 11/23/18  Intake/Output from previous day: 03/01 0701 - 03/02 0700 In: 176.3 [IV Piggyback:176.3] Out: 1100 [Urine:1100] General: Alert, awake without distress. Chronically ill appearing.  Head: Normocephalic atraumatic. Mouth: mucus membranes dry with mucositis and oral thrush noted.  Eyes: No scleral icterus.  Pupils are equal and round reactive to light. Resp: clear to auscultation bilaterally without rhonchi or wheezes or dullness to percussion. Cardio: Tachycardic. Regular rhythm, S1, S2 normal, no murmur, click, rub or gallop. 1+ bilateral pedal edema.  GI: soft, mild tenderness with palpation.  bowel sounds normal; no masses,  no organomegaly Musculoskeletal: No joint deformity or effusion. Neurological: No motor, sensory deficits.  Intact deep tendon reflexes. Skin: No rashes or lesions.  Portacath/PICC-without erythema  LABORATORY DATA: Lab Results  Component Value Date   WBC 0.2 (LL) 11/25/2018   HGB 9.1 (L) 11/25/2018   HCT 29.0 (L) 11/25/2018   MCV 89.0 11/25/2018   PLT 19 (LL) 11/25/2018    CMP  Latest Ref Rng & Units 11/25/2018 11/24/2018 11/23/2018  Glucose 70 - 99 mg/dL 129(H) 80 -  BUN 6 - 20 mg/dL <5(L) 6 -  Creatinine 0.44 - 1.00 mg/dL 0.59 0.67 -  Sodium 135 - 145 mmol/L 142 140 -  Potassium 3.5 - 5.1 mmol/L 2.8(L) 2.8(L) 3.1(L)  Chloride 98 - 111 mmol/L 109 105 -  CO2 22 -  32 mmol/L 14(L) 15(L) -  Calcium 8.9 - 10.3 mg/dL 6.8(L) 7.5(L) -  Total Protein 6.5 - 8.1 g/dL - - -  Total Bilirubin 0.3 - 1.2 mg/dL - - -  Alkaline Phos 38 - 126 U/L - - -  AST 15 - 41 U/L - - -  ALT 0 - 44 U/L - - -     RADIOGRAPHIC STUDIES:  Dg Chest 2 View  Result Date: 11/21/2018 CLINICAL DATA:  History of lymphoma.  Patient weak and flushed. EXAM: CHEST - 2 VIEW COMPARISON:  Chest CT, 11/18/2018.  Chest radiographs, 11/11/2018. FINDINGS: Mild enlargement of the cardiopericardial silhouette. No mediastinal or hilar masses. No convincing adenopathy. Small bilateral pleural effusions. Mild dependent lower lobe atelectasis. Lungs otherwise clear. No pneumothorax. Right anterior chest wall, internal jugular, Port-A-Cath is stable from the prior chest CT. Skeletal structures are unremarkable. IMPRESSION: 1. Findings are similar to the recent prior chest CT. Enlargement of the cardiopericardial silhouette is consistent with the pericardial effusion noted on CT. There are small bilateral pleural effusions with associated lower lobe atelectasis. No convincing pneumonia and no pulmonary edema. Electronically Signed   By: Lajean Manes M.D.   On: 11/21/2018 15:05   Ct Soft Tissue Neck W Contrast  Result Date: 11/18/2018 CLINICAL DATA:  Initial evaluation for Hodgkin's lymphoma, initial workup. EXAM: CT NECK WITH CONTRAST CT CHEST, ABDOMEN, AND PELVIS WITH CONTRAST TECHNIQUE: Multidetector CT imaging of the chest, abdomen and pelvis was performed following the standard protocol during bolus administration of intravenous contrast. CONTRAST:  158m OMNIPAQUE IOHEXOL 300 MG/ML SOLN, 384mOMNIPAQUE IOHEXOL 300 MG/ML  SOLN COMPARISON:  None. FINDINGS: CT NECK FINDINGS: Pharynx and larynx: Oral cavity within normal limits without mass lesion or loculated collection. Patient is largely edentulous. Calcified tonsilliths noted within the right palatine tonsil. Tonsils themselves symmetric and within normal limits bilaterally. Parapharyngeal fat maintained. Nasopharynx normal. No retropharyngeal collection. Epiglottis normal. Vallecula clear. Remainder of the hypopharynx and supraglottic larynx normal. Glottis within normal limits. Subglottic airway clear. Salivary glands: Parotid and submandibular glands within normal limits. Thyroid: Thyroid diffusely enlarged without discrete nodule or mass. Lymph nodes: Mildly prominent level II lymph nodes measure up to 9 mm in short axis bilaterally. Left level III nodes measure up to 9 mm as well. Increased number of shotty subcentimeter nodes within the neck bilaterally, left slightly worse than right. No other pathologically enlarged lymph nodes identified within the neck. Vascular: Normal intravascular enhancement seen throughout the neck. Right IJ approach central venous catheter in place. Soft tissue swelling with stranding and scattered foci of soft tissue emphysema within the right neck likely related to central line placement. Limited intracranial: Unremarkable Visualized orbits: Partially visualized inferior globes and orbits unremarkable. Mastoids and visualized paranasal sinuses: Mucosal thickening noted within the visualized maxillary and sphenoid sinuses. Visualized paranasal sinuses are otherwise clear. Visualize mastoids and middle ear cavities are clear. Skeleton: No acute osseous abnormality. No discrete lytic or blastic osseous lesions. Mild cervical spondylolysis present at C5-6. Other: None. CT CHEST FINDINGS Cardiovascular: Normal intravascular enhancement seen throughout the intra-abdominal aorta without aneurysm or other acute abnormality. Minimal atheromatous plaque  within the aortic arch. Visualized great vessels within normal limits. Heart size normal. Small moderate pericardial effusion, simple fluid density. Mediastinum/Nodes: Enlarged nodes within the upper mediastinum measure up to 15 mm in short axis (series 3, image 11). No appreciable supraclavicular adenopathy. No other pathologically enlarged mediastinal lymph nodes. Mild soft tissue density within the right hilum without frank adenopathy. Mildly enlarged  11 mm left hilar node (series 3, image 28). Enlarged axillary adenopathy seen bilaterally, with the largest node on the left measuring 2.1 cm in short axis (series 3, image 16). Largest node on the right measures 1.7 cm in short axis (series 3, image 8). Scattered soft tissue stranding with emphysema and fluid density overlying the left axilla likely related to recent lymph node biopsy, partially visualized. Esophagus within normal limits. Lungs/Pleura: Tracheobronchial tree intact and patent. Moderate layering bilateral pleural effusions with associated atelectasis. No other airspace consolidation. No pulmonary edema. No pneumothorax. 4 mm subpleural ground-glass nodule present at the anterior right upper lobe (series 4, image 57), indeterminate. No other worrisome pulmonary nodule or mass. Musculoskeletal: No acute osseous finding. No discrete lytic or blastic osseous lesions. CT ABDOMEN PELVIS FINDINGS Hepatobiliary: Liver demonstrates a normal contrast enhanced appearance. Gallbladder within normal limits. No biliary dilatation. Pancreas: Pancreas within normal limits. Spleen: Spleen enlarged measuring 14.3 cm in craniocaudad dimension. Few subtle hypodensities noted within the spleen, largest of which measures approximately 12 mm (series 3, image 57), indeterminate. Adrenals/Urinary Tract: Adrenal glands are normal. Kidneys equal in size with symmetric enhancement. No nephrolithiasis, hydronephrosis, or focal enhancing renal mass. No hydroureter. Bladder within  normal limits. Stomach/Bowel: Stomach within normal limits. No evidence for bowel obstruction. Normal appendix. No acute inflammatory changes seen about the bowels. Vascular/Lymphatic: Normal intravascular enhancement seen throughout the intra-abdominal aorta. Mild aorto bi-iliac atherosclerotic disease. No aneurysm. Mesenteric vessels patent proximally. Enlarged 15 mm aortocaval lymph node (series 3, image 7). Additional multifocal shotty subcentimeter aortocaval and periaortic lymph nodes noted. No other pathologically enlarged intra-abdominal lymph nodes identified. Mildly prominent 12 mm left inguinal lymph nodes noted. No other adenopathy within the pelvis. Reproductive: Uterus within normal limits.  Ovaries normal. Other: Moderate volume free fluid within the pelvis, measuring simple fluid density. No free intraperitoneal air. Musculoskeletal: Scattered anasarca noted within the external soft tissues. No acute osseous finding. No discrete lytic or blastic osseous lesions. IMPRESSION: CT NECK IMPRESSION 1. Increased number of shoddy subcentimeter lymph nodes throughout the neck bilaterally, left slightly worse than right. No pathologically enlarged lymph nodes identified. 2. Mild diffuse thyromegaly without discrete thyroid nodule or mass. 3. Mild soft tissue stranding with emphysema within the right lateral neck related to recent right-sided Port-A-Cath placement. CT CHEST IMPRESSION 1. Enlarged bilateral axillary and upper mediastinal adenopathy as above, likely related to provided history of lymphoma. Additional borderline enlarged left hilar node may be related to lymphoma or possibly reactive in nature. Attention at follow-up recommended. 2. Moderate layering bilateral pleural effusions with associated atelectasis. 3. Small to moderate pericardial effusion. 4. 4 mm right upper lobe ground-glass nodule, indeterminate. Attention at follow-up recommended. CT ABDOMEN AND PELVIS IMPRESSION 1. Enlarged 15 mm  aortocaval lymph node with mildly enlarged 12 mm left inguinal lymph nodes. Additional increased number of shotty subcentimeter retroperitoneal lymph nodes. Findings most likely related to history of lymphoma. 2. Splenomegaly. 3. Moderate volume free fluid within the pelvis, of uncertain etiology, but could be physiologic and/or related overall volume status. 4. Mild diffuse anasarca. Electronically Signed   By: Jeannine Boga M.D.   On: 11/18/2018 19:56   Ct Chest W Contrast  Result Date: 11/18/2018 CLINICAL DATA:  Initial evaluation for Hodgkin's lymphoma, initial workup. EXAM: CT NECK WITH CONTRAST CT CHEST, ABDOMEN, AND PELVIS WITH CONTRAST TECHNIQUE: Multidetector CT imaging of the chest, abdomen and pelvis was performed following the standard protocol during bolus administration of intravenous contrast. CONTRAST:  166m OMNIPAQUE IOHEXOL 300  MG/ML SOLN, 62m OMNIPAQUE IOHEXOL 300 MG/ML SOLN COMPARISON:  None. FINDINGS: CT NECK FINDINGS: Pharynx and larynx: Oral cavity within normal limits without mass lesion or loculated collection. Patient is largely edentulous. Calcified tonsilliths noted within the right palatine tonsil. Tonsils themselves symmetric and within normal limits bilaterally. Parapharyngeal fat maintained. Nasopharynx normal. No retropharyngeal collection. Epiglottis normal. Vallecula clear. Remainder of the hypopharynx and supraglottic larynx normal. Glottis within normal limits. Subglottic airway clear. Salivary glands: Parotid and submandibular glands within normal limits. Thyroid: Thyroid diffusely enlarged without discrete nodule or mass. Lymph nodes: Mildly prominent level II lymph nodes measure up to 9 mm in short axis bilaterally. Left level III nodes measure up to 9 mm as well. Increased number of shotty subcentimeter nodes within the neck bilaterally, left slightly worse than right. No other pathologically enlarged lymph nodes identified within the neck. Vascular: Normal  intravascular enhancement seen throughout the neck. Right IJ approach central venous catheter in place. Soft tissue swelling with stranding and scattered foci of soft tissue emphysema within the right neck likely related to central line placement. Limited intracranial: Unremarkable Visualized orbits: Partially visualized inferior globes and orbits unremarkable. Mastoids and visualized paranasal sinuses: Mucosal thickening noted within the visualized maxillary and sphenoid sinuses. Visualized paranasal sinuses are otherwise clear. Visualize mastoids and middle ear cavities are clear. Skeleton: No acute osseous abnormality. No discrete lytic or blastic osseous lesions. Mild cervical spondylolysis present at C5-6. Other: None. CT CHEST FINDINGS Cardiovascular: Normal intravascular enhancement seen throughout the intra-abdominal aorta without aneurysm or other acute abnormality. Minimal atheromatous plaque within the aortic arch. Visualized great vessels within normal limits. Heart size normal. Small moderate pericardial effusion, simple fluid density. Mediastinum/Nodes: Enlarged nodes within the upper mediastinum measure up to 15 mm in short axis (series 3, image 11). No appreciable supraclavicular adenopathy. No other pathologically enlarged mediastinal lymph nodes. Mild soft tissue density within the right hilum without frank adenopathy. Mildly enlarged 11 mm left hilar node (series 3, image 28). Enlarged axillary adenopathy seen bilaterally, with the largest node on the left measuring 2.1 cm in short axis (series 3, image 16). Largest node on the right measures 1.7 cm in short axis (series 3, image 8). Scattered soft tissue stranding with emphysema and fluid density overlying the left axilla likely related to recent lymph node biopsy, partially visualized. Esophagus within normal limits. Lungs/Pleura: Tracheobronchial tree intact and patent. Moderate layering bilateral pleural effusions with associated atelectasis.  No other airspace consolidation. No pulmonary edema. No pneumothorax. 4 mm subpleural ground-glass nodule present at the anterior right upper lobe (series 4, image 57), indeterminate. No other worrisome pulmonary nodule or mass. Musculoskeletal: No acute osseous finding. No discrete lytic or blastic osseous lesions. CT ABDOMEN PELVIS FINDINGS Hepatobiliary: Liver demonstrates a normal contrast enhanced appearance. Gallbladder within normal limits. No biliary dilatation. Pancreas: Pancreas within normal limits. Spleen: Spleen enlarged measuring 14.3 cm in craniocaudad dimension. Few subtle hypodensities noted within the spleen, largest of which measures approximately 12 mm (series 3, image 57), indeterminate. Adrenals/Urinary Tract: Adrenal glands are normal. Kidneys equal in size with symmetric enhancement. No nephrolithiasis, hydronephrosis, or focal enhancing renal mass. No hydroureter. Bladder within normal limits. Stomach/Bowel: Stomach within normal limits. No evidence for bowel obstruction. Normal appendix. No acute inflammatory changes seen about the bowels. Vascular/Lymphatic: Normal intravascular enhancement seen throughout the intra-abdominal aorta. Mild aorto bi-iliac atherosclerotic disease. No aneurysm. Mesenteric vessels patent proximally. Enlarged 15 mm aortocaval lymph node (series 3, image 7). Additional multifocal shotty subcentimeter aortocaval and periaortic lymph  nodes noted. No other pathologically enlarged intra-abdominal lymph nodes identified. Mildly prominent 12 mm left inguinal lymph nodes noted. No other adenopathy within the pelvis. Reproductive: Uterus within normal limits.  Ovaries normal. Other: Moderate volume free fluid within the pelvis, measuring simple fluid density. No free intraperitoneal air. Musculoskeletal: Scattered anasarca noted within the external soft tissues. No acute osseous finding. No discrete lytic or blastic osseous lesions. IMPRESSION: CT NECK IMPRESSION 1.  Increased number of shoddy subcentimeter lymph nodes throughout the neck bilaterally, left slightly worse than right. No pathologically enlarged lymph nodes identified. 2. Mild diffuse thyromegaly without discrete thyroid nodule or mass. 3. Mild soft tissue stranding with emphysema within the right lateral neck related to recent right-sided Port-A-Cath placement. CT CHEST IMPRESSION 1. Enlarged bilateral axillary and upper mediastinal adenopathy as above, likely related to provided history of lymphoma. Additional borderline enlarged left hilar node may be related to lymphoma or possibly reactive in nature. Attention at follow-up recommended. 2. Moderate layering bilateral pleural effusions with associated atelectasis. 3. Small to moderate pericardial effusion. 4. 4 mm right upper lobe ground-glass nodule, indeterminate. Attention at follow-up recommended. CT ABDOMEN AND PELVIS IMPRESSION 1. Enlarged 15 mm aortocaval lymph node with mildly enlarged 12 mm left inguinal lymph nodes. Additional increased number of shotty subcentimeter retroperitoneal lymph nodes. Findings most likely related to history of lymphoma. 2. Splenomegaly. 3. Moderate volume free fluid within the pelvis, of uncertain etiology, but could be physiologic and/or related overall volume status. 4. Mild diffuse anasarca. Electronically Signed   By: Jeannine Boga M.D.   On: 11/18/2018 19:56   Ct Abdomen Pelvis W Contrast  Result Date: 11/18/2018 CLINICAL DATA:  Initial evaluation for Hodgkin's lymphoma, initial workup. EXAM: CT NECK WITH CONTRAST CT CHEST, ABDOMEN, AND PELVIS WITH CONTRAST TECHNIQUE: Multidetector CT imaging of the chest, abdomen and pelvis was performed following the standard protocol during bolus administration of intravenous contrast. CONTRAST:  160m OMNIPAQUE IOHEXOL 300 MG/ML SOLN, 331mOMNIPAQUE IOHEXOL 300 MG/ML SOLN COMPARISON:  None. FINDINGS: CT NECK FINDINGS: Pharynx and larynx: Oral cavity within normal limits  without mass lesion or loculated collection. Patient is largely edentulous. Calcified tonsilliths noted within the right palatine tonsil. Tonsils themselves symmetric and within normal limits bilaterally. Parapharyngeal fat maintained. Nasopharynx normal. No retropharyngeal collection. Epiglottis normal. Vallecula clear. Remainder of the hypopharynx and supraglottic larynx normal. Glottis within normal limits. Subglottic airway clear. Salivary glands: Parotid and submandibular glands within normal limits. Thyroid: Thyroid diffusely enlarged without discrete nodule or mass. Lymph nodes: Mildly prominent level II lymph nodes measure up to 9 mm in short axis bilaterally. Left level III nodes measure up to 9 mm as well. Increased number of shotty subcentimeter nodes within the neck bilaterally, left slightly worse than right. No other pathologically enlarged lymph nodes identified within the neck. Vascular: Normal intravascular enhancement seen throughout the neck. Right IJ approach central venous catheter in place. Soft tissue swelling with stranding and scattered foci of soft tissue emphysema within the right neck likely related to central line placement. Limited intracranial: Unremarkable Visualized orbits: Partially visualized inferior globes and orbits unremarkable. Mastoids and visualized paranasal sinuses: Mucosal thickening noted within the visualized maxillary and sphenoid sinuses. Visualized paranasal sinuses are otherwise clear. Visualize mastoids and middle ear cavities are clear. Skeleton: No acute osseous abnormality. No discrete lytic or blastic osseous lesions. Mild cervical spondylolysis present at C5-6. Other: None. CT CHEST FINDINGS Cardiovascular: Normal intravascular enhancement seen throughout the intra-abdominal aorta without aneurysm or other acute abnormality. Minimal atheromatous plaque  within the aortic arch. Visualized great vessels within normal limits. Heart size normal. Small moderate  pericardial effusion, simple fluid density. Mediastinum/Nodes: Enlarged nodes within the upper mediastinum measure up to 15 mm in short axis (series 3, image 11). No appreciable supraclavicular adenopathy. No other pathologically enlarged mediastinal lymph nodes. Mild soft tissue density within the right hilum without frank adenopathy. Mildly enlarged 11 mm left hilar node (series 3, image 28). Enlarged axillary adenopathy seen bilaterally, with the largest node on the left measuring 2.1 cm in short axis (series 3, image 16). Largest node on the right measures 1.7 cm in short axis (series 3, image 8). Scattered soft tissue stranding with emphysema and fluid density overlying the left axilla likely related to recent lymph node biopsy, partially visualized. Esophagus within normal limits. Lungs/Pleura: Tracheobronchial tree intact and patent. Moderate layering bilateral pleural effusions with associated atelectasis. No other airspace consolidation. No pulmonary edema. No pneumothorax. 4 mm subpleural ground-glass nodule present at the anterior right upper lobe (series 4, image 57), indeterminate. No other worrisome pulmonary nodule or mass. Musculoskeletal: No acute osseous finding. No discrete lytic or blastic osseous lesions. CT ABDOMEN PELVIS FINDINGS Hepatobiliary: Liver demonstrates a normal contrast enhanced appearance. Gallbladder within normal limits. No biliary dilatation. Pancreas: Pancreas within normal limits. Spleen: Spleen enlarged measuring 14.3 cm in craniocaudad dimension. Few subtle hypodensities noted within the spleen, largest of which measures approximately 12 mm (series 3, image 57), indeterminate. Adrenals/Urinary Tract: Adrenal glands are normal. Kidneys equal in size with symmetric enhancement. No nephrolithiasis, hydronephrosis, or focal enhancing renal mass. No hydroureter. Bladder within normal limits. Stomach/Bowel: Stomach within normal limits. No evidence for bowel obstruction. Normal  appendix. No acute inflammatory changes seen about the bowels. Vascular/Lymphatic: Normal intravascular enhancement seen throughout the intra-abdominal aorta. Mild aorto bi-iliac atherosclerotic disease. No aneurysm. Mesenteric vessels patent proximally. Enlarged 15 mm aortocaval lymph node (series 3, image 7). Additional multifocal shotty subcentimeter aortocaval and periaortic lymph nodes noted. No other pathologically enlarged intra-abdominal lymph nodes identified. Mildly prominent 12 mm left inguinal lymph nodes noted. No other adenopathy within the pelvis. Reproductive: Uterus within normal limits.  Ovaries normal. Other: Moderate volume free fluid within the pelvis, measuring simple fluid density. No free intraperitoneal air. Musculoskeletal: Scattered anasarca noted within the external soft tissues. No acute osseous finding. No discrete lytic or blastic osseous lesions. IMPRESSION: CT NECK IMPRESSION 1. Increased number of shoddy subcentimeter lymph nodes throughout the neck bilaterally, left slightly worse than right. No pathologically enlarged lymph nodes identified. 2. Mild diffuse thyromegaly without discrete thyroid nodule or mass. 3. Mild soft tissue stranding with emphysema within the right lateral neck related to recent right-sided Port-A-Cath placement. CT CHEST IMPRESSION 1. Enlarged bilateral axillary and upper mediastinal adenopathy as above, likely related to provided history of lymphoma. Additional borderline enlarged left hilar node may be related to lymphoma or possibly reactive in nature. Attention at follow-up recommended. 2. Moderate layering bilateral pleural effusions with associated atelectasis. 3. Small to moderate pericardial effusion. 4. 4 mm right upper lobe ground-glass nodule, indeterminate. Attention at follow-up recommended. CT ABDOMEN AND PELVIS IMPRESSION 1. Enlarged 15 mm aortocaval lymph node with mildly enlarged 12 mm left inguinal lymph nodes. Additional increased number  of shotty subcentimeter retroperitoneal lymph nodes. Findings most likely related to history of lymphoma. 2. Splenomegaly. 3. Moderate volume free fluid within the pelvis, of uncertain etiology, but could be physiologic and/or related overall volume status. 4. Mild diffuse anasarca. Electronically Signed   By: Pincus Badder.D.  On: 11/18/2018 19:56   Ct Biopsy  Result Date: 11/15/2018 INDICATION: Pancytopenia, concern for lymphoproliferative process EXAM: CT GUIDED RIGHT ILIAC BONE MARROW ASPIRATION AND CORE BIOPSY Date:  11/15/2018 11/15/2018 10:13 am Radiologist:  M. Daryll Brod, MD Guidance:  CT FLUOROSCOPY TIME:  Fluoroscopy Time: None. MEDICATIONS: 1% lidocaine local ANESTHESIA/SEDATION: 2.0 mg IV Versed; 50 mcg IV Fentanyl Moderate Sedation Time:  10 minutes The patient was continuously monitored during the procedure by the interventional radiology nurse under my direct supervision. CONTRAST:  None. COMPLICATIONS: None PROCEDURE: Informed consent was obtained from the patient following explanation of the procedure, risks, benefits and alternatives. The patient understands, agrees and consents for the procedure. All questions were addressed. A time out was performed. The patient was positioned prone and non-contrast localization CT was performed of the pelvis to demonstrate the iliac marrow spaces. Maximal barrier sterile technique utilized including caps, mask, sterile gowns, sterile gloves, large sterile drape, hand hygiene, and Betadine prep. Under sterile conditions and local anesthesia, an 11 gauge coaxial bone biopsy needle was advanced into the right iliac marrow space. Needle position was confirmed with CT imaging. Initially, bone marrow aspiration was performed. Next, the 11 gauge outer cannula was utilized to obtain a right iliac bone marrow core biopsy. Needle was removed. Hemostasis was obtained with compression. The patient tolerated the procedure well. Samples were prepared with the  cytotechnologist. No immediate complications. IMPRESSION: CT guided right iliac bone marrow aspiration and core biopsy. Electronically Signed   By: Jerilynn Mages.  Shick M.D.   On: 11/15/2018 11:25   Dg Chest Port 1 View  Result Date: 11/11/2018 CLINICAL DATA:  Fever EXAM: PORTABLE CHEST 1 VIEW COMPARISON:  None. FINDINGS: Mild cardiomegaly. No focal consolidation or effusion. No pneumothorax. IMPRESSION: No active disease. Electronically Signed   By: Donavan Foil M.D.   On: 11/11/2018 21:59   Ct Bone Marrow Biopsy & Aspiration  Result Date: 11/15/2018 INDICATION: Pancytopenia, concern for lymphoproliferative process EXAM: CT GUIDED RIGHT ILIAC BONE MARROW ASPIRATION AND CORE BIOPSY Date:  11/15/2018 11/15/2018 10:13 am Radiologist:  M. Daryll Brod, MD Guidance:  CT FLUOROSCOPY TIME:  Fluoroscopy Time: None. MEDICATIONS: 1% lidocaine local ANESTHESIA/SEDATION: 2.0 mg IV Versed; 50 mcg IV Fentanyl Moderate Sedation Time:  10 minutes The patient was continuously monitored during the procedure by the interventional radiology nurse under my direct supervision. CONTRAST:  None. COMPLICATIONS: None PROCEDURE: Informed consent was obtained from the patient following explanation of the procedure, risks, benefits and alternatives. The patient understands, agrees and consents for the procedure. All questions were addressed. A time out was performed. The patient was positioned prone and non-contrast localization CT was performed of the pelvis to demonstrate the iliac marrow spaces. Maximal barrier sterile technique utilized including caps, mask, sterile gowns, sterile gloves, large sterile drape, hand hygiene, and Betadine prep. Under sterile conditions and local anesthesia, an 11 gauge coaxial bone biopsy needle was advanced into the right iliac marrow space. Needle position was confirmed with CT imaging. Initially, bone marrow aspiration was performed. Next, the 11 gauge outer cannula was utilized to obtain a right iliac bone  marrow core biopsy. Needle was removed. Hemostasis was obtained with compression. The patient tolerated the procedure well. Samples were prepared with the cytotechnologist. No immediate complications. IMPRESSION: CT guided right iliac bone marrow aspiration and core biopsy. Electronically Signed   By: Jerilynn Mages.  Shick M.D.   On: 11/15/2018 11:25   Ir Imaging Guided Port Insertion  Result Date: 11/18/2018 INDICATION: 53 year old female with history lymphoma EXAM:  IMPLANTED PORT A CATH PLACEMENT WITH ULTRASOUND AND FLUOROSCOPIC GUIDANCE MEDICATIONS: 2.0 g Ancef; The antibiotic was administered within an appropriate time interval prior to skin puncture. ANESTHESIA/SEDATION: Moderate (conscious) sedation was employed during this procedure. A total of Versed 2.0 mg and Fentanyl 100 mcg was administered intravenously. Moderate Sedation Time: 18 minutes. The patient's level of consciousness and vital signs were monitored continuously by radiology nursing throughout the procedure under my direct supervision. FLUOROSCOPY TIME:  0 minutes, 12 seconds (1.0 mGy) COMPLICATIONS: None PROCEDURE: The procedure, risks, benefits, and alternatives were explained to the patient. Questions regarding the procedure were encouraged and answered. The patient understands and consents to the procedure. Ultrasound survey was performed with images stored and sent to PACs. The right neck and chest was prepped with chlorhexidine, and draped in the usual sterile fashion using maximum barrier technique (cap and mask, sterile gown, sterile gloves, large sterile sheet, hand hygiene and cutaneous antiseptic). Antibiotic prophylaxis was provided with 2.0g Ancef administered IV one hour prior to skin incision. Local anesthesia was attained by infiltration with 1% lidocaine without epinephrine. Ultrasound demonstrated patency of the right internal jugular vein, and this was documented with an image. Under real-time ultrasound guidance, this vein was  accessed with a 21 gauge micropuncture needle and image documentation was performed. A small dermatotomy was made at the access site with an 11 scalpel. A 0.018" wire was advanced into the SVC and used to estimate the length of the internal catheter. The access needle exchanged for a 22F micropuncture vascular sheath. The 0.018" wire was then removed and a 0.035" wire advanced into the IVC. An appropriate location for the subcutaneous reservoir was selected below the clavicle and an incision was made through the skin and underlying soft tissues. The subcutaneous tissues were then dissected using a combination of blunt and sharp surgical technique and a pocket was formed. A single lumen power injectable portacatheter was then tunneled through the subcutaneous tissues from the pocket to the dermatotomy and the port reservoir placed within the subcutaneous pocket. The venous access site was then serially dilated and a peel away vascular sheath placed over the wire. The wire was removed and the port catheter advanced into position under fluoroscopic guidance. The catheter tip is positioned in the cavoatrial junction. This was documented with a spot image. The portacatheter was then tested and found to flush and aspirate well. The port was flushed with saline followed by 100 units/mL heparinized saline. The pocket was then closed in two layers using first subdermal inverted interrupted absorbable sutures followed by a running subcuticular suture. The epidermis was then sealed with Dermabond. The dermatotomy at the venous access site was also seal with Dermabond. Patient tolerated the procedure well and remained hemodynamically stable throughout. No complications encountered and no significant blood loss encountered IMPRESSION: Status post right IJ port catheter placement. Catheter ready for use. Signed, Dulcy Fanny. Dellia Nims, RPVI [facility] Radiology Electronically Signed    By: Corrie Mckusick D.O.   On: 11/18/2018 11:23     ASSESSMENT/PLAN:  Stage IV Hodgkin lymphoma -Port placed in 10/2018; TTE showed normal LVEF -The patient received day 1 of cycle 1 ofAVD + Adcedris on 11/19/2018.; received for Granix x 5 days, starting2/26/2020. Remains neutropenic. Granix will be continued. Orders have been entered.  -Recommend PRN anti-emetics, including Zofran and Compazine  Normocytic anemia -Likely secondary to anemia of chronic disease and underlying lymphoma -Hemoglobin is 9.1. No transfusion indicated today. -Supportive transfusion to keep  Hgb > 7  Thrombocytopenia -Likely secondary to underlying lymphoma and recent chemotherapy. -Platelets 19,000 with small amount of rectal bleeding. Agree with platelet transfusion today. -Daily CBC -Goal plts >10k in the absence of bleeding   Persistent fever -Secondary to underlying lymphoma -Patient has undergone extensive infectious work-up that did not identify any infection -Continue to monitor it for now -Recommend monitoring the patient to ensure that she is fever-free for at least 24to 48hrs. without Tylenol before discharge.  Severe protein malnutrition -Patient has lost > 20 lbs since the onset of her symptoms, and her most recent albumin was 1.7 with anasarca, consistent with severe protein malnutrition -Dietitian is following. -If her nutrition remains very poor and she is unable to increase her PO intake, we will have to consider enteral nutrition, such as PEG tube, to help improve her nutritional intake  Diarrhea with abdominal discomfort -Recommend stool for c-diff. -Continue pain meds.  Patient's daughter, Anderson Malta, [phone], was updated at the bedside today.   LOS: 14 days   Mikey Bussing, DNP, AGPCNP-BC, AOCNP 11/25/18

## 2018-11-25 NOTE — Consult Note (Signed)
NAME:  Cindy Ward, MRN:  [id number], DOB:  [birth year], LOS: 10 ADMISSION DATE:  11/11/2018, CONSULTATION DATE:  11/25/2018  REFERRING MD:  Tana Coast, CHIEF COMPLAINT:  sepsis   Brief History   53 year old lady with a history of nausea vomiting fevers night sweats about 7 months duration She had initially been admitted to the hospital following presentation to [facility] where she had had a CT scan showing extensive adenopathy, transferred to [facility] hospital for further evaluation and management Diagnosed with Hodgkin's lymphoma diagnosis made via lymph node biopsy Hospital course complicated by recurrent fevers felt to be related to malignancy  History of present illness   Recurrent fevers, night sweats, decreased appetite, weight loss led to presentation to the hospital  Past Medical History   Past Medical History:  Diagnosis Date  . Hypertension   . Thyroid disease   .  Hodgkin's lymphoma   Significant Hospital Events   Intermittent fevers  Consults:  Hematology PCCM 11/25/2018  Procedures:    Significant Diagnostic Tests:  CT chest abdomen and pelvis-extensive adenopathy, pleural effusions, small abdominal fluid on 11/18/2018  Micro Data:  Blood culture  11/13/2018 no growth   11/21/2018 no growth   11/25/2018-drawn Urine culture 11/14/2018-no growth MRSA PCR -11/11/2018  Chest x-ray 11/25/2018-mild blunting on the left Antimicrobials:  Cefepime 3/2>> Vancomycin 3/2>> Fluconazole 3/1>>  Interim history/subjective:  More short of breath Saturating well Has not been taking in much orally  Objective   Blood pressure (!) 145/71, pulse (!) 121, temperature 100.3 F (37.9 C), temperature source Oral, resp. rate (!) 34, height 5\' 8"  (1.727 m), weight 74.8 kg, SpO2 96 %.        Intake/Output Summary (Last 24 hours) at 11/25/2018 1315 Last data filed at 11/25/2018 1300 Gross per 24 hour  Intake 1388.04 ml  Output 1100 ml  Net 288.04 ml   Filed Weights   11/20/18  0532 11/22/18 0514 11/24/18 0627  Weight: 78.5 kg 76.2 kg 74.8 kg    Examination: General: Middle-aged lady, chronically ill looking, frail HENT: Dry oral mucosa Lungs: Fair air entry bilaterally, no rales Cardiovascular: S1-S2 appreciated Abdomen: Bowel sounds appreciated Extremities: No clubbing, no edema Neuro: Alert and oriented x3, moving all extremities GU: Fair output  Resolved Hospital Problem list     Assessment & Plan:  Febrile neutropenia -T-max of 101.1 -Blood cultures obtained -Started on antibiotics  Sepsis -Source is unclear -Cover with antibiotics -Follow-up cultures -Obtain fungal cultures, galactomannan, Fungitell  Hodgkin's lymphoma stage IV -Recent diagnosis -CT scan shows extensive adenopathy -Chemotherapy on 2/25 contributing to her neutropenia -Followed sleep by oncology  Acute respiratory failure -Related to sepsis  Metabolic encephalopathy -Related to sepsis  Pancytopenia -On Granix-oncology following -No evidence of bleeding  Hypokalemia -Being corrected -Trend closely  Severe protein calorie malnutrition -Orally as tolerated  Oral thrush -On Diflucan  Hypothyroidism -On Synthroid  Acutely ill Significant risk of decompensation Discussed with family at bedside  Best practice:  Diet: Orally as tolerated Pain/Anxiety/Delirium protocol (if indicated): As needed, on oxycodone, Dilaudid VAP protocol (if indicated): Not indicated DVT prophylaxis: SCDs, precluded by severe thrombocytopenia GI prophylaxis: Mobility: Bedrest Code Status: Full code Family Communication: Discussed with family at bedside Disposition:   Labs   CBC: Recent Labs  Lab 11/21/18 0619 11/22/18 0609 11/23/18 0610 11/24/18 0600 11/25/18 0554  WBC 18.2* 5.5 1.1* 0.5* 0.2*  NEUTROABS  --   --  0.9* 0.2*  --   HGB 8.4* 6.3* 9.2* 9.4* 9.1*  HCT 26.3*  20.1* 27.7* 29.6* 29.0*  MCV 85.9 87.4 86.3 88.9 89.0  PLT 68* 32* 26* 28* 19*    Basic  Metabolic Panel: Recent Labs  Lab 11/21/18 0619 11/22/18 0609 11/23/18 0610 11/23/18 1000 11/23/18 1502 11/24/18 0634 11/25/18 0554  NA 138 138 138  --   --  140 142  K 3.5 3.1* 2.7*  --  3.1* 2.8* 2.8*  CL 108 109 104  --   --  105 109  CO2 18* 20* 21*  --   --  15* 14*  GLUCOSE 99 103* 91  --   --  80 129*  BUN 19 18 14   --   --  6 <5*  CREATININE 0.86 0.69 0.51  --   --  0.67 0.59  CALCIUM 7.3* 7.1* 7.3*  --   --  7.5* 6.8*  MG  --   --   --  2.0  --   --  1.9   GFR: Estimated Creatinine Clearance: 82 mL/min (by C-G formula based on SCr of 0.59 mg/dL). Recent Labs  Lab 11/22/18 0609 11/23/18 0610 11/24/18 0600 11/25/18 0554  WBC 5.5 1.1* 0.5* 0.2*    Liver Function Tests: Recent Labs  Lab 11/19/18 0805 11/23/18 0610 11/25/18 0920  AST 30 32  --   ALT 15 25  --   ALKPHOS 154* 126  --   BILITOT 1.2 1.2  --   PROT 5.1* 4.9*  --   ALBUMIN 1.7* 1.7* 2.1*   No results for input(s): LIPASE, AMYLASE in the last 168 hours. No results for input(s): AMMONIA in the last 168 hours.  ABG    Component Value Date/Time   PHART 7.446 11/25/2018 1250   PCO2ART BELOW REPORTABLE RANGE 11/25/2018 1250   PO2ART 77.0 (L) 11/25/2018 1250   O2SAT 96.4 11/25/2018 1250     Coagulation Profile: No results for input(s): INR, PROTIME in the last 168 hours.  Cardiac Enzymes: No results for input(s): CKTOTAL, CKMB, CKMBINDEX, TROPONINI in the last 168 hours.  HbA1C: No results found for: HGBA1C  CBG: Recent Labs  Lab 11/20/18 2153  GLUCAP 92    Review of Systems:   Review of Systems  Constitutional: Positive for fever and malaise/fatigue.  HENT: Negative.   Eyes: Negative.   Respiratory: Positive for shortness of breath. Negative for cough, hemoptysis, sputum production and wheezing.   Cardiovascular: Negative.   Gastrointestinal: Negative.   Genitourinary: Negative.   Skin: Negative.    Past Medical History  She,  has a past medical history of Hypertension and  Thyroid disease.   Surgical History    Past Surgical History:  Procedure Laterality Date  . AXILLARY LYMPH NODE BIOPSY Left 11/12/2018   Procedure: AXILLARY LYMPH NODE BIOPSY LEFT;  Surgeon: Coralie Keens, MD;  Location: [facility];  Service: General;  Laterality: Left;  . IR IMAGING GUIDED PORT INSERTION  11/18/2018     Social History   reports that she has never smoked. She has never used smokeless tobacco. She reports that she does not drink alcohol or use drugs.   Family History   Her family history is not on file.   Allergies No Known Allergies   Home Medications  Prior to Admission medications   Medication Sig Start Date End Date Taking? Authorizing Provider  levothyroxine (SYNTHROID, LEVOTHROID) 75 MCG tablet Take 1 tablet by mouth daily. 11/23/17  Yes [provider]  Multiple Vitamin (MULTIVITAMIN WITH MINERALS) TABS tablet Take 1 tablet by mouth daily.  Yes [provider]  nebivolol (BYSTOLIC) 10 MG tablet Take 1 tablet by mouth daily. 10/13/16  Yes [provider]  ondansetron (ZOFRAN-ODT) 8 MG disintegrating tablet Take 1 tablet by mouth as needed for nausea/vomiting. PRN TID 02/27/18  Yes [provider]  PROAIR HFA 108 (90 Base) MCG/ACT inhaler Take 2 puffs by mouth 4 (four) times daily as needed. 02/27/18  Yes [provider]  dexamethasone (DECADRON) 4 MG tablet Take 2 tablets by mouth once a day on the day after chemotherapy and then take 2 tablets two times a day for 2 days. Take with food. 11/18/18   Tish Men, MD  lidocaine-prilocaine (EMLA) cream Apply to affected area once 11/18/18   Tish Men, MD  LORazepam (ATIVAN) 0.5 MG tablet Take 1 tablet (0.5 mg total) by mouth every 6 (six) hours as needed (Nausea or vomiting). 11/18/18   Tish Men, MD  ondansetron (ZOFRAN) 8 MG tablet Take 1 tablet (8 mg total) by mouth 2 (two) times daily as needed. Start on the third day after chemotherapy. 11/18/18   Tish Men, MD  prochlorperazine  (COMPAZINE) 10 MG tablet Take 1 tablet (10 mg total) by mouth every 6 (six) hours as needed (Nausea or vomiting). 11/18/18   Tish Men, MD     Critical care time: 35 min spent evaluating, reviewing records, formulating plan of care, recurrent risk of decompensation is quite high with her underlying hematologic malignancy and febrile neutropenia

## 2018-11-25 NOTE — Progress Notes (Signed)
RN noted a mews score of 2 (yellow) after loging into epic. NT went into room to get a follow-up set of vital signs and RN notified MD. On follow-up vital signs patient noted to have fever, mews score updated to 4 (red).  Frequent vital signs initiated per protocol. RN re-paged MD and headed into room with dose of tylenol per PRN orders.  RN noted patient to be more lethargic and unable to swallow tylenol (crushed in applesauce). RN noted pt with severe thrush.  Pt alert and oriented though slow to respond.  Speech clear with no deficits noted. MD and rapid response RN at bedside.  Will transfer to step down for closer monitoring.

## 2018-11-25 NOTE — Progress Notes (Signed)
Patients respirations increased to 40 breaths/ min. MD paged. STAT ABG ordered.

## 2018-11-25 NOTE — Progress Notes (Signed)
CRITICAL VALUE ALERT  Critical Value:  Calcium 6.4   Date & Time Notied:  11/25/2018  Provider Notified: Rai MD  Orders Received/Actions taken: Awaiting orders

## 2018-11-26 ENCOUNTER — Inpatient Hospital Stay (HOSPITAL_COMMUNITY): Payer: Medicaid Other

## 2018-11-26 DIAGNOSIS — B370 Candidal stomatitis: Secondary | ICD-10-CM | POA: Diagnosis present

## 2018-11-26 DIAGNOSIS — R6520 Severe sepsis without septic shock: Secondary | ICD-10-CM

## 2018-11-26 DIAGNOSIS — D709 Neutropenia, unspecified: Secondary | ICD-10-CM

## 2018-11-26 DIAGNOSIS — R5081 Fever presenting with conditions classified elsewhere: Secondary | ICD-10-CM

## 2018-11-26 DIAGNOSIS — A419 Sepsis, unspecified organism: Secondary | ICD-10-CM

## 2018-11-26 DIAGNOSIS — R0601 Orthopnea: Secondary | ICD-10-CM

## 2018-11-26 LAB — CBC WITH DIFFERENTIAL/PLATELET
HCT: 24.7 % — ABNORMAL LOW (ref 36.0–46.0)
Hemoglobin: 8.2 g/dL — ABNORMAL LOW (ref 12.0–15.0)
MCH: 28.3 pg (ref 26.0–34.0)
MCHC: 33.2 g/dL (ref 30.0–36.0)
MCV: 85.2 fL (ref 80.0–100.0)
Platelets: 18 10*3/uL — CL (ref 150–400)
RBC: 2.9 MIL/uL — ABNORMAL LOW (ref 3.87–5.11)
RDW: 17.3 % — ABNORMAL HIGH (ref 11.5–15.5)
WBC: 0.2 10*3/uL — CL (ref 4.0–10.5)
nRBC: 0 % (ref 0.0–0.2)

## 2018-11-26 LAB — BLOOD CULTURE ID PANEL (REFLEXED)
Acinetobacter baumannii: NOT DETECTED
Candida albicans: NOT DETECTED
Candida glabrata: NOT DETECTED
Candida krusei: NOT DETECTED
Candida parapsilosis: NOT DETECTED
Candida tropicalis: NOT DETECTED
Carbapenem resistance: NOT DETECTED
ENTEROBACTERIACEAE SPECIES: DETECTED — AB
Enterobacter cloacae complex: NOT DETECTED
Enterococcus species: NOT DETECTED
Escherichia coli: DETECTED — AB
Haemophilus influenzae: NOT DETECTED
Klebsiella oxytoca: NOT DETECTED
Klebsiella pneumoniae: NOT DETECTED
Listeria monocytogenes: NOT DETECTED
Neisseria meningitidis: NOT DETECTED
PSEUDOMONAS AERUGINOSA: NOT DETECTED
Proteus species: NOT DETECTED
STAPHYLOCOCCUS AUREUS BCID: NOT DETECTED
STREPTOCOCCUS PNEUMONIAE: NOT DETECTED
STREPTOCOCCUS PYOGENES: NOT DETECTED
Serratia marcescens: NOT DETECTED
Staphylococcus species: NOT DETECTED
Streptococcus agalactiae: DETECTED — AB
Streptococcus species: DETECTED — AB

## 2018-11-26 LAB — BASIC METABOLIC PANEL
Anion gap: 12 (ref 5–15)
Anion gap: 12 (ref 5–15)
BUN: <5 mg/dL — ABNORMAL LOW (ref 6–20)
BUN: <5 mg/dL — ABNORMAL LOW (ref 6–20)
CHLORIDE: 110 mmol/L (ref 98–111)
CHLORIDE: 110 mmol/L (ref 98–111)
CO2: 25 mmol/L (ref 22–32)
CO2: 25 mmol/L (ref 22–32)
Calcium: 4.4 mg/dL — CL (ref 8.9–10.3)
Calcium: <4 mg/dL — CL (ref 8.9–10.3)
Creatinine, Ser: 0.53 mg/dL (ref 0.44–1.00)
Creatinine, Ser: 0.56 mg/dL (ref 0.44–1.00)
GFR calc Af Amer: >60 mL/min (ref >60)
GFR calc non Af Amer: >60 mL/min (ref >60)
GFR calc non Af Amer: >60 mL/min (ref >60)
Glucose, Bld: 189 mg/dL — ABNORMAL HIGH (ref 70–99)
Glucose, Bld: 143 mg/dL — ABNORMAL HIGH (ref 70–99)
Potassium: 2.7 mmol/L — CL (ref 3.5–5.1)
Potassium: 3.3 mmol/L — ABNORMAL LOW (ref 3.5–5.1)
Sodium: 147 mmol/L — ABNORMAL HIGH (ref 135–145)
Sodium: 147 mmol/L — ABNORMAL HIGH (ref 135–145)

## 2018-11-26 LAB — PREPARE PLATELET PHERESIS: Unit division: 0

## 2018-11-26 LAB — MAGNESIUM
Magnesium: 1.7 mg/dL (ref 1.7–2.4)
Magnesium: 1.6 mg/dL — ABNORMAL LOW (ref 1.7–2.4)

## 2018-11-26 LAB — CULTURE, BLOOD (ROUTINE X 2)
CULTURE: NO GROWTH
Culture: NO GROWTH
Special Requests: ADEQUATE
Special Requests: ADEQUATE

## 2018-11-26 LAB — BPAM PLATELET PHERESIS
Blood Product Expiration Date: 202003022359
ISSUE DATE / TIME: 202003021037
Unit Type and Rh: 8400

## 2018-11-26 LAB — ALBUMIN: Albumin: 2.1 g/dL — ABNORMAL LOW (ref 3.5–5.0)

## 2018-11-26 LAB — URIC ACID: Uric Acid, Serum: 1.8 mg/dL — ABNORMAL LOW (ref 2.5–7.1)

## 2018-11-26 LAB — PHOSPHORUS: Phosphorus: <1 mg/dL — CL (ref 2.5–4.6)

## 2018-11-26 MED ORDER — CALCIUM GLUCONATE-NACL 2-0.675 GM/100ML-% IV SOLN
2.0000 g | Freq: Once | INTRAVENOUS | Status: AC
  Administered 2018-11-26: 2000 mg via INTRAVENOUS
  Filled 2018-11-26: qty 100

## 2018-11-26 MED ORDER — UNJURY CHICKEN SOUP POWDER
8.0000 [oz_av] | Freq: Two times a day (BID) | ORAL | Status: DC | PRN
  Filled 2018-11-26: qty 27

## 2018-11-26 MED ORDER — FUROSEMIDE 10 MG/ML IJ SOLN
40.0000 mg | Freq: Once | INTRAMUSCULAR | Status: AC
  Administered 2018-11-26: 40 mg via INTRAVENOUS
  Filled 2018-11-26: qty 4

## 2018-11-26 MED ORDER — MAGNESIUM SULFATE 2 GM/50ML IV SOLN
2.0000 g | Freq: Once | INTRAVENOUS | Status: AC
  Administered 2018-11-26: 2 g via INTRAVENOUS
  Filled 2018-11-26: qty 50

## 2018-11-26 MED ORDER — SODIUM CHLORIDE 0.9 % IV SOLN
2.0000 g | Freq: Every day | INTRAVENOUS | Status: AC
  Administered 2018-11-26 – 2018-12-05 (×11): 2 g via INTRAVENOUS
  Filled 2018-11-26 (×3): qty 2
  Filled 2018-11-26: qty 20
  Filled 2018-11-26 (×8): qty 2

## 2018-11-26 MED ORDER — POTASSIUM CHLORIDE CRYS ER 20 MEQ PO TBCR: 40.0000 meq | EXTENDED_RELEASE_TABLET | Freq: Four times a day (QID) | ORAL | Status: DC

## 2018-11-26 MED ORDER — POTASSIUM PHOSPHATES 15 MMOLE/5ML IV SOLN
40.0000 meq | Freq: Once | INTRAVENOUS | Status: AC
  Administered 2018-11-26: 40 meq via INTRAVENOUS
  Filled 2018-11-26 (×2): qty 9.09

## 2018-11-26 MED ORDER — METHOCARBAMOL 1000 MG/10ML IJ SOLN
500.0000 mg | Freq: Once | INTRAVENOUS | Status: AC
  Administered 2018-11-26: 500 mg via INTRAVENOUS
  Filled 2018-11-26: qty 500
  Filled 2018-11-26: qty 5

## 2018-11-26 MED ORDER — POTASSIUM CHLORIDE 10 MEQ/100ML IV SOLN
10.0000 meq | INTRAVENOUS | Status: AC
  Administered 2018-11-26 (×5): 10 meq via INTRAVENOUS
  Filled 2018-11-26 (×5): qty 100

## 2018-11-26 MED ORDER — BOOST / RESOURCE BREEZE PO LIQD CUSTOM: 1.0000 | Freq: Every day | ORAL | Status: DC | PRN

## 2018-11-26 NOTE — Progress Notes (Signed)
PHARMACY - PHYSICIAN COMMUNICATION CRITICAL VALUE ALERT - BLOOD CULTURE IDENTIFICATION (BCID)  Cindy Ward is an 53 y.o. female who presented to [facility] on 11/11/2018 with a chief complaint of recurrent fevers, night sweats, decreased appetite, weight loss.  Assessment:  sepsis (include suspected source if known) 4 of 4 bottles: strep agalatiae and e- coli Name of physician (or Provider) Contacted: Raliegh Ip Schorr, NP  Current antibiotics: Cefepime and Vancomycin  Changes to prescribed antibiotics recommended:  Will narrow to Rocephin 2 Gm IV q24h which per antibiogram covers e-coli at 99% and strep agalactiae 100 %. Will leave on Vanco for now since polymicrobial infection.    Results for orders placed or performed during the hospital encounter of 11/11/18  Blood Culture ID Panel (Reflexed) (Collected: 11/25/2018  9:08 AM)  Result Value Ref Range   Enterococcus species NOT DETECTED NOT DETECTED   Listeria monocytogenes NOT DETECTED NOT DETECTED   Staphylococcus species NOT DETECTED NOT DETECTED   Staphylococcus aureus (BCID) NOT DETECTED NOT DETECTED   Streptococcus species DETECTED (A) NOT DETECTED   Streptococcus agalactiae DETECTED (A) NOT DETECTED   Streptococcus pneumoniae NOT DETECTED NOT DETECTED   Streptococcus pyogenes NOT DETECTED NOT DETECTED   Acinetobacter baumannii NOT DETECTED NOT DETECTED   Enterobacteriaceae species DETECTED (A) NOT DETECTED   Enterobacter cloacae complex NOT DETECTED NOT DETECTED   Escherichia coli DETECTED (A) NOT DETECTED   Klebsiella oxytoca NOT DETECTED NOT DETECTED   Klebsiella pneumoniae NOT DETECTED NOT DETECTED   Proteus species NOT DETECTED NOT DETECTED   Serratia marcescens NOT DETECTED NOT DETECTED   Carbapenem resistance NOT DETECTED NOT DETECTED   Haemophilus influenzae NOT DETECTED NOT DETECTED   Neisseria meningitidis NOT DETECTED NOT DETECTED   Pseudomonas aeruginosa NOT DETECTED NOT DETECTED   Candida albicans NOT DETECTED NOT  DETECTED   Candida glabrata NOT DETECTED NOT DETECTED   Candida krusei NOT DETECTED NOT DETECTED   Candida parapsilosis NOT DETECTED NOT DETECTED   Candida tropicalis NOT DETECTED NOT DETECTED    Dorrene German 11/26/2018  12:44 AM

## 2018-11-26 NOTE — Progress Notes (Addendum)
CRITICAL VALUE ALERT  Critical Value: Unable to read, will call RN back, after re-ran, Ca <4.0   Date & Time Notied:  11/26/2018, 1813  Provider Notified: Elsworth Soho MD   Orders Received/Actions taken: 2 grams Ca going in now

## 2018-11-26 NOTE — Progress Notes (Signed)
Patient hands became contracted with fingers straight. Patient unable to bend fingers. Paged MD, worried about potassium level. Patient has received 5 runs of K+ today. Verbal orders for STAT BMET, magnesium, and albumin labwork. Drawn from port-a-cath and sent to lab. Patient started breathing 40+times/min. MD paged again and verbal orders for lasix and portable chest xray.

## 2018-11-26 NOTE — Progress Notes (Signed)
Patient hands and legs are stiff, from hypocalcemia. Rai MD and Elsworth Soho MD contacted and 2 grams Ca hanging now. Lab called to have calcium ran STAT. Lab said the Calcium would not read and they would re-run. Elsworth Soho MD paged.

## 2018-11-26 NOTE — Progress Notes (Signed)
[facility]                                                                              Patient Demographics  Cindy Ward, is a 53 y.o. female, DOB - Jun 27, 1966, KKX:[id number]  Admit date - 11/11/2018   Admitting Physician A Melven Sartorius., MD  Outpatient Primary MD for the patient is [name], MD  Outpatient specialists:   LOS - 15  days   Medical records reviewed and are as summarized below:    No chief complaint on file.      Brief summary   53 y.o.femalewith medical history significant ofhypertension hypothyroidism presents at [facility] with recurrent nausea, vomiting, fevers, and night sweats of 9 months duration.She notes that the symptoms occur about every 2 weeks. She has nightly fevers and night sweats. The symptoms occur for days at a time, but have been taking longer and longer to resolve.She came to the hospital today because she passed out at the cancer center. She describes getting dizzy and then hitting the floor. She was seen in the emergency department at [facility]. Her vitals were notable for a fever to 102.7. Multiple enlarged lymph nodes noted on CT abdomen and pelvis. The case was discussed with [name] who recommended transfer to [facility] for further workup as well as LDH, haptoglobin, and retics. 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 Hospital course complicated by recurrent high fevers related to malignancy.   Assessment & Plan    Principal Problem:   Hodgkin lymphoma ([place]) Newly diagnosed, stage IV -Patient presented with persistent fevers, lymphadenopathy, no active infective process -Lymph node biopsies on 2/18 showed classical Hodgkin lymphoma -CT chest abdomen and pelvis, neck showed a diffuse lymphadenopathy involving the cervical, thoracic and abdominal lymph nodes -Port-A-Cath placed on 2/24, started on chemotherapy on 2/25, next cycle  after 2 weeks, -Management per oncology  Fevers, acute respiratory failure E. coli, group B strep bacteremia -Initially had presented with FUO, patient had extensive work-up during this hospitalization for fevers and found to have stage IV Hodgkin lymphoma. -Afebrile today, still tachycardiac  - Blood cultures 3/2 showed E. coli, Streptococcus agalactiae, await fungal cultures, galactomannan -Vancomycin discontinued, continue IV Rocephin, PCCM following -Per RN, had a loose watery BM, check C. Difficile PCR(neutropenia, abdominal pain, diarrhea)  Acute metabolic encephalopathy -Patient much more alert and oriented today, likely mental status worsened due to acute respiratory failure with hypoxia, fevers, sepsis   Pancytopenia with neutropenia, thrombocytopenia, anemia -Continue Granix, oncology managing -Patient received 1 unit pheresis platelets on 3/2, platelets 18K  Hypokalemia Patient unable to tolerate potassium pills due to oral thrush, changed to IV potassium replacement   Severe protein calorie malnutrition in the setting of malignancy, dysphagia -Patient having difficulty eating. -Oral thrush, started on nystatin, continue Diflucan, changed to IV  Small pericardial effusion 2D echo on 2/18 showed EF of 55 to 60%, small pericardial effusion most prominently along the RV/RA, stable, no tamponade physiology Outpatient follow-up with periodic EKGs  Near syncope Likely secondary to anemia, dehydration, #1  Hypothyroidism Changed to IV Synthroid  Generalized debility PT evaluation  recommended home health PT versus skilled nursing facility, patient prefers home health PT although does not have 24/7 supervision   Code Status: Full code DVT Prophylaxis:  SCD's Family Communication: Discussed in detail with the patient, all imaging results, lab results explained to the patient   Disposition Plan: Continue stepdown unit due to high risk of deterioration, neutropenic fever,  sepsis  Time Spent in minutes 35 minutes  Procedures:  2D echo  Consultants:   Oncology Infectious disease CCM  Antimicrobials:   Anti-infectives (From admission, onward)   Start     Dose/Rate Route Frequency Ordered Stop   11/26/18 0200  cefTRIAXone (ROCEPHIN) 2 g in sodium chloride 0.9 % 100 mL IVPB     2 g 200 mL/hr over 30 Minutes Intravenous Daily at bedtime 11/26/18 0059     11/25/18 1200  fluconazole (DIFLUCAN) IVPB 200 mg     200 mg 100 mL/hr over 60 Minutes Intravenous Every 24 hours 11/25/18 0836     11/25/18 1000  vancomycin (VANCOCIN) IVPB 1000 mg/200 mL premix  Status:  Discontinued     1,000 mg 200 mL/hr over 60 Minutes Intravenous Every 12 hours 11/25/18 0848 11/26/18 0827   11/25/18 1000  ceFEPIme (MAXIPIME) 2 g in sodium chloride 0.9 % 100 mL IVPB  Status:  Discontinued     2 g 200 mL/hr over 30 Minutes Intravenous Every 8 hours 11/25/18 0848 11/26/18 0058   11/25/18 0900  vancomycin (VANCOCIN) 1,500 mg in sodium chloride 0.9 % 500 mL IVPB     1,500 mg 250 mL/hr over 120 Minutes Intravenous  Once 11/25/18 0848 11/25/18 1135   11/24/18 1500  fluconazole (DIFLUCAN) tablet 200 mg  Status:  Discontinued     200 mg Oral Daily 11/24/18 1450 11/25/18 0836   11/18/18 0600  ceFAZolin (ANCEF) IVPB 2g/100 mL premix     2 g 200 mL/hr over 30 Minutes Intravenous To Radiology 11/17/18 1243 11/18/18 1130   11/12/18 0800  cefTRIAXone (ROCEPHIN) 2 g in sodium chloride 0.9 % 100 mL IVPB  Status:  Discontinued     2 g 200 mL/hr over 30 Minutes Intravenous Every 24 hours 11/11/18 1711 11/12/18 1057         Medications  Scheduled Meds: . Chlorhexidine Gluconate Cloth  6 each Topical Daily  . levothyroxine  37.5 mcg Intravenous Daily  . mouth rinse  15 mL Mouth Rinse BID  . nystatin  5 mL Oral QID  . scopolamine  1 patch Transdermal Q72H  . Tbo-filgastrim (GRANIX) SQ  480 mcg Subcutaneous q1800   Continuous Infusions: . sodium chloride Stopped (11/25/18 1936)  .  sodium chloride    . cefTRIAXone (ROCEPHIN)  IV Stopped (11/26/18 0235)  . fluconazole (DIFLUCAN) IV Stopped (11/26/18 1208)  . potassium chloride 100 mL/hr at 11/26/18 1351   PRN Meds:.sodium chloride, acetaminophen **OR** acetaminophen, feeding supplement, HYDROmorphone (DILAUDID) injection, ibuprofen, iohexol, lip balm, ondansetron **OR** ondansetron (ZOFRAN) IV, oxyCODONE, phenol, protein supplement, sodium chloride flush      Subjective:   Skarlett Sedlacek was seen and examined today.  Feeling somewhat better today, much more alert and oriented.  Afebrile however still tachycardiac, difficulty eating due to oral thrush. complaining of abdominal pain, diarrhea x2  Objective:   Vitals:   11/26/18 1230 11/26/18 1300 11/26/18 1330 11/26/18 1400  BP: 124/72 132/79 127/75   Pulse: (!) 101 (!) 102 (!) 105 (!) 103  Resp: (!) 25 (!) 28 (!) 31 (!) 36  Temp:  TempSrc:      SpO2: 93% 93% 93% 94%  Weight:      Height:        Intake/Output Summary (Last 24 hours) at 11/26/2018 1425 Last data filed at 11/26/2018 1420 Gross per 24 hour  Intake 3367.26 ml  Output 500 ml  Net 2867.26 ml     Wt Readings from Last 3 Encounters:  11/24/18 74.8 kg  06/27/18 78.4 kg  05/14/18 79.4 kg   Physical Exam  General: Much more alert and oriented today, ill-appearing  Eyes:   HEENT:  Atraumatic, normocephalic, oral thrush  Cardiovascular: S1 S2 clear, RRR. No pedal edema b/l  Respiratory: CTAB, no wheezing, rales or rhonchi  Gastrointestinal: Soft, mild diffuse tenderness, NBS  Ext: no pedal edema bilaterally  Neuro: no new deficits  Musculoskeletal: No cyanosis, clubbing  Skin: No rashes  Psych: Flat affect     Data Reviewed:  I have personally reviewed following labs and imaging studies  Micro Results Recent Results (from the past 240 hour(s))  Culture, blood (routine x 2)     Status: None   Collection Time: 11/21/18  2:51 PM  Result Value Ref Range Status   Specimen  Description   Final    BLOOD LEFT ANTECUBITAL Performed at [facility] [address], [place], [place] [address]    Special Requests   Final    BOTTLES DRAWN AEROBIC AND ANAEROBIC Blood Culture adequate volume Performed at [facility] [address] [place], [place] [address]    Culture   Final    NO GROWTH 5 DAYS Performed at [facility], [place] [address], [place], [place] [address]    Report Status 11/26/2018 FINAL  Final  Culture, blood (routine x 2)     Status: None   Collection Time: 11/21/18  2:59 PM  Result Value Ref Range Status   Specimen Description   Final    BLOOD RIGHT ANTECUBITAL Performed at [facility] [address], [place], [place] [address]    Special Requests   Final    BOTTLES DRAWN AEROBIC ONLY Blood Culture adequate volume Performed at [facility] [address], [place], [place] [address]    Culture   Final    NO GROWTH 5 DAYS Performed at [facility], [place] [address], [place], [place] [address]    Report Status 11/26/2018 FINAL  Final  Culture, blood (routine x 2)     Status: None (Preliminary result)   Collection Time: 11/25/18  9:08 AM  Result Value Ref Range Status   Specimen Description   Final    BLOOD RIGHT HAND Performed at [facility] [address] [place], [place] [address]    Special Requests   Final    BAA BCAV Performed at [facility] [address], [place], [place] [address]    Culture  Setup Time   Final    GRAM POSITIVE COCCI GRAM NEGATIVE RODS IN BOTH AEROBIC AND ANAEROBIC BOTTLES CRITICAL VALUE NOTED.  VALUE IS CONSISTENT WITH PREVIOUSLY REPORTED AND CALLED VALUE. Performed at [facility], [place] [address], [place], [place] [address]    Culture GRAM POSITIVE COCCI GRAM NEGATIVE RODS   Final   Report Status PENDING  Incomplete  Culture, blood (routine x 2)     Status: Abnormal  (Preliminary result)   Collection Time: 11/25/18  9:08 AM  Result Value Ref Range Status  Specimen Description   Final    BLOOD LEFT ARM Performed at [facility] [address] [place], [place] [address]    Special Requests   Final    BAA BCAV Performed at [facility] [address], [place], [place] [address]    Culture  Setup Time   Final    GRAM POSITIVE COCCI GRAM NEGATIVE RODS IN BOTH AEROBIC AND ANAEROBIC BOTTLES CRITICAL RESULT CALLED TO, READ BACK BY AND VERIFIED WITH: E GREEN PHARMD 11/26/18 0025 JDW    Culture (A)  Final    ESCHERICHIA COLI GROUP B STREP(S.AGALACTIAE)ISOLATED SUSCEPTIBILITIES TO FOLLOW Performed at [facility], [place] [address] [place], [place] [address]    Report Status PENDING  Incomplete  Blood Culture ID Panel (Reflexed)     Status: Abnormal   Collection Time: 11/25/18  9:08 AM  Result Value Ref Range Status   Enterococcus species NOT DETECTED NOT DETECTED Final   Listeria monocytogenes NOT DETECTED NOT DETECTED Final   Staphylococcus species NOT DETECTED NOT DETECTED Final   Staphylococcus aureus (BCID) NOT DETECTED NOT DETECTED Final   Streptococcus species DETECTED (A) NOT DETECTED Final    Comment: CRITICAL RESULT CALLED TO, READ BACK BY AND VERIFIED WITH: E GREEN PHARMD 11/26/18 0025 JDW    Streptococcus agalactiae DETECTED (A) NOT DETECTED Final    Comment: CRITICAL RESULT CALLED TO, READ BACK BY AND VERIFIED WITH: E GREEN PHARMD 11/26/18 0025 JDW    Streptococcus pneumoniae NOT DETECTED NOT DETECTED Final   Streptococcus pyogenes NOT DETECTED NOT DETECTED Final   Acinetobacter baumannii NOT DETECTED NOT DETECTED Final   Enterobacteriaceae species DETECTED (A) NOT DETECTED Final    Comment: Enterobacteriaceae represent a large family of gram-negative bacteria, not a single organism. CRITICAL RESULT CALLED TO, READ BACK BY AND VERIFIED WITH: E GREEN PHARMD 11/26/18 0025 JDW    Enterobacter  cloacae complex NOT DETECTED NOT DETECTED Final   Escherichia coli DETECTED (A) NOT DETECTED Final    Comment: CRITICAL RESULT CALLED TO, READ BACK BY AND VERIFIED WITH: E GREEN PHARMD 11/26/18 0025 JDW    Klebsiella oxytoca NOT DETECTED NOT DETECTED Final   Klebsiella pneumoniae NOT DETECTED NOT DETECTED Final   Proteus species NOT DETECTED NOT DETECTED Final   Serratia marcescens NOT DETECTED NOT DETECTED Final   Carbapenem resistance NOT DETECTED NOT DETECTED Final   Haemophilus influenzae NOT DETECTED NOT DETECTED Final   Neisseria meningitidis NOT DETECTED NOT DETECTED Final   Pseudomonas aeruginosa NOT DETECTED NOT DETECTED Final   Candida albicans NOT DETECTED NOT DETECTED Final   Candida glabrata NOT DETECTED NOT DETECTED Final   Candida krusei NOT DETECTED NOT DETECTED Final   Candida parapsilosis NOT DETECTED NOT DETECTED Final   Candida tropicalis NOT DETECTED NOT DETECTED Final    Comment: Performed at [facility], [place] [address], [place], [place] [address]    Radiology Reports Dg Chest 2 View  Result Date: 11/21/2018 CLINICAL DATA:  History of lymphoma.  Patient weak and flushed. EXAM: CHEST - 2 VIEW COMPARISON:  Chest CT, 11/18/2018.  Chest radiographs, 11/11/2018. FINDINGS: Mild enlargement of the cardiopericardial silhouette. No mediastinal or hilar masses. No convincing adenopathy. Small bilateral pleural effusions. Mild dependent lower lobe atelectasis. Lungs otherwise clear. No pneumothorax. Right anterior chest wall, internal jugular, Port-A-Cath is stable from the prior chest CT. Skeletal structures are unremarkable. IMPRESSION: 1. Findings are similar to the recent prior chest CT. Enlargement of the cardiopericardial silhouette is consistent  with the pericardial effusion noted on CT. There are small bilateral pleural effusions with associated lower lobe atelectasis. No convincing pneumonia and no pulmonary edema. Electronically Signed   By: Lajean Manes M.D.   On:  11/21/2018 15:05   Ct Head Wo Contrast  Result Date: 11/25/2018 CLINICAL DATA:  Hodgkin's lymphoma, fevers, headache EXAM: CT HEAD WITHOUT CONTRAST TECHNIQUE: Contiguous axial images were obtained from the base of the skull through the vertex without intravenous contrast. COMPARISON:  11/25/2018 FINDINGS: Brain: No evidence of acute infarction, hemorrhage, hydrocephalus, extra-axial collection or mass lesion/mass effect. Mild periventricular white matter hypodensity. Vascular: No hyperdense vessel or unexpected calcification. Skull: Normal. Negative for fracture or focal lesion. Sinuses/Orbits: No acute finding. Other: None. IMPRESSION: No acute intracranial pathology. No non-contrast CT findings to explain headache. Mild small-vessel white matter disease. Electronically Signed   By: Eddie Candle M.D.   On: 11/25/2018 12:37   Ct Soft Tissue Neck W Contrast  Result Date: 11/18/2018 CLINICAL DATA:  Initial evaluation for Hodgkin's lymphoma, initial workup. EXAM: CT NECK WITH CONTRAST CT CHEST, ABDOMEN, AND PELVIS WITH CONTRAST TECHNIQUE: Multidetector CT imaging of the chest, abdomen and pelvis was performed following the standard protocol during bolus administration of intravenous contrast. CONTRAST:  142m OMNIPAQUE IOHEXOL 300 MG/ML SOLN, 319mOMNIPAQUE IOHEXOL 300 MG/ML SOLN COMPARISON:  None. FINDINGS: CT NECK FINDINGS: Pharynx and larynx: Oral cavity within normal limits without mass lesion or loculated collection. Patient is largely edentulous. Calcified tonsilliths noted within the right palatine tonsil. Tonsils themselves symmetric and within normal limits bilaterally. Parapharyngeal fat maintained. Nasopharynx normal. No retropharyngeal collection. Epiglottis normal. Vallecula clear. Remainder of the hypopharynx and supraglottic larynx normal. Glottis within normal limits. Subglottic airway clear. Salivary glands: Parotid and submandibular glands within normal limits. Thyroid: Thyroid diffusely  enlarged without discrete nodule or mass. Lymph nodes: Mildly prominent level II lymph nodes measure up to 9 mm in short axis bilaterally. Left level III nodes measure up to 9 mm as well. Increased number of shotty subcentimeter nodes within the neck bilaterally, left slightly worse than right. No other pathologically enlarged lymph nodes identified within the neck. Vascular: Normal intravascular enhancement seen throughout the neck. Right IJ approach central venous catheter in place. Soft tissue swelling with stranding and scattered foci of soft tissue emphysema within the right neck likely related to central line placement. Limited intracranial: Unremarkable Visualized orbits: Partially visualized inferior globes and orbits unremarkable. Mastoids and visualized paranasal sinuses: Mucosal thickening noted within the visualized maxillary and sphenoid sinuses. Visualized paranasal sinuses are otherwise clear. Visualize mastoids and middle ear cavities are clear. Skeleton: No acute osseous abnormality. No discrete lytic or blastic osseous lesions. Mild cervical spondylolysis present at C5-6. Other: None. CT CHEST FINDINGS Cardiovascular: Normal intravascular enhancement seen throughout the intra-abdominal aorta without aneurysm or other acute abnormality. Minimal atheromatous plaque within the aortic arch. Visualized great vessels within normal limits. Heart size normal. Small moderate pericardial effusion, simple fluid density. Mediastinum/Nodes: Enlarged nodes within the upper mediastinum measure up to 15 mm in short axis (series 3, image 11). No appreciable supraclavicular adenopathy. No other pathologically enlarged mediastinal lymph nodes. Mild soft tissue density within the right hilum without frank adenopathy. Mildly enlarged 11 mm left hilar node (series 3, image 28). Enlarged axillary adenopathy seen bilaterally, with the largest node on the left measuring 2.1 cm in short axis (series 3, image 16). Largest  node on the right measures 1.7 cm in short axis (series 3, image 8). Scattered soft tissue stranding with  emphysema and fluid density overlying the left axilla likely related to recent lymph node biopsy, partially visualized. Esophagus within normal limits. Lungs/Pleura: Tracheobronchial tree intact and patent. Moderate layering bilateral pleural effusions with associated atelectasis. No other airspace consolidation. No pulmonary edema. No pneumothorax. 4 mm subpleural ground-glass nodule present at the anterior right upper lobe (series 4, image 57), indeterminate. No other worrisome pulmonary nodule or mass. Musculoskeletal: No acute osseous finding. No discrete lytic or blastic osseous lesions. CT ABDOMEN PELVIS FINDINGS Hepatobiliary: Liver demonstrates a normal contrast enhanced appearance. Gallbladder within normal limits. No biliary dilatation. Pancreas: Pancreas within normal limits. Spleen: Spleen enlarged measuring 14.3 cm in craniocaudad dimension. Few subtle hypodensities noted within the spleen, largest of which measures approximately 12 mm (series 3, image 57), indeterminate. Adrenals/Urinary Tract: Adrenal glands are normal. Kidneys equal in size with symmetric enhancement. No nephrolithiasis, hydronephrosis, or focal enhancing renal mass. No hydroureter. Bladder within normal limits. Stomach/Bowel: Stomach within normal limits. No evidence for bowel obstruction. Normal appendix. No acute inflammatory changes seen about the bowels. Vascular/Lymphatic: Normal intravascular enhancement seen throughout the intra-abdominal aorta. Mild aorto bi-iliac atherosclerotic disease. No aneurysm. Mesenteric vessels patent proximally. Enlarged 15 mm aortocaval lymph node (series 3, image 7). Additional multifocal shotty subcentimeter aortocaval and periaortic lymph nodes noted. No other pathologically enlarged intra-abdominal lymph nodes identified. Mildly prominent 12 mm left inguinal lymph nodes noted. No other  adenopathy within the pelvis. Reproductive: Uterus within normal limits.  Ovaries normal. Other: Moderate volume free fluid within the pelvis, measuring simple fluid density. No free intraperitoneal air. Musculoskeletal: Scattered anasarca noted within the external soft tissues. No acute osseous finding. No discrete lytic or blastic osseous lesions. IMPRESSION: CT NECK IMPRESSION 1. Increased number of shoddy subcentimeter lymph nodes throughout the neck bilaterally, left slightly worse than right. No pathologically enlarged lymph nodes identified. 2. Mild diffuse thyromegaly without discrete thyroid nodule or mass. 3. Mild soft tissue stranding with emphysema within the right lateral neck related to recent right-sided Port-A-Cath placement. CT CHEST IMPRESSION 1. Enlarged bilateral axillary and upper mediastinal adenopathy as above, likely related to provided history of lymphoma. Additional borderline enlarged left hilar node may be related to lymphoma or possibly reactive in nature. Attention at follow-up recommended. 2. Moderate layering bilateral pleural effusions with associated atelectasis. 3. Small to moderate pericardial effusion. 4. 4 mm right upper lobe ground-glass nodule, indeterminate. Attention at follow-up recommended. CT ABDOMEN AND PELVIS IMPRESSION 1. Enlarged 15 mm aortocaval lymph node with mildly enlarged 12 mm left inguinal lymph nodes. Additional increased number of shotty subcentimeter retroperitoneal lymph nodes. Findings most likely related to history of lymphoma. 2. Splenomegaly. 3. Moderate volume free fluid within the pelvis, of uncertain etiology, but could be physiologic and/or related overall volume status. 4. Mild diffuse anasarca. Electronically Signed   By: Jeannine Boga M.D.   On: 11/18/2018 19:56   Ct Chest W Contrast  Result Date: 11/18/2018 CLINICAL DATA:  Initial evaluation for Hodgkin's lymphoma, initial workup. EXAM: CT NECK WITH CONTRAST CT CHEST, ABDOMEN, AND  PELVIS WITH CONTRAST TECHNIQUE: Multidetector CT imaging of the chest, abdomen and pelvis was performed following the standard protocol during bolus administration of intravenous contrast. CONTRAST:  154m OMNIPAQUE IOHEXOL 300 MG/ML SOLN, 3107mOMNIPAQUE IOHEXOL 300 MG/ML SOLN COMPARISON:  None. FINDINGS: CT NECK FINDINGS: Pharynx and larynx: Oral cavity within normal limits without mass lesion or loculated collection. Patient is largely edentulous. Calcified tonsilliths noted within the right palatine tonsil. Tonsils themselves symmetric and within normal limits bilaterally. Parapharyngeal fat  maintained. Nasopharynx normal. No retropharyngeal collection. Epiglottis normal. Vallecula clear. Remainder of the hypopharynx and supraglottic larynx normal. Glottis within normal limits. Subglottic airway clear. Salivary glands: Parotid and submandibular glands within normal limits. Thyroid: Thyroid diffusely enlarged without discrete nodule or mass. Lymph nodes: Mildly prominent level II lymph nodes measure up to 9 mm in short axis bilaterally. Left level III nodes measure up to 9 mm as well. Increased number of shotty subcentimeter nodes within the neck bilaterally, left slightly worse than right. No other pathologically enlarged lymph nodes identified within the neck. Vascular: Normal intravascular enhancement seen throughout the neck. Right IJ approach central venous catheter in place. Soft tissue swelling with stranding and scattered foci of soft tissue emphysema within the right neck likely related to central line placement. Limited intracranial: Unremarkable Visualized orbits: Partially visualized inferior globes and orbits unremarkable. Mastoids and visualized paranasal sinuses: Mucosal thickening noted within the visualized maxillary and sphenoid sinuses. Visualized paranasal sinuses are otherwise clear. Visualize mastoids and middle ear cavities are clear. Skeleton: No acute osseous abnormality. No discrete lytic  or blastic osseous lesions. Mild cervical spondylolysis present at C5-6. Other: None. CT CHEST FINDINGS Cardiovascular: Normal intravascular enhancement seen throughout the intra-abdominal aorta without aneurysm or other acute abnormality. Minimal atheromatous plaque within the aortic arch. Visualized great vessels within normal limits. Heart size normal. Small moderate pericardial effusion, simple fluid density. Mediastinum/Nodes: Enlarged nodes within the upper mediastinum measure up to 15 mm in short axis (series 3, image 11). No appreciable supraclavicular adenopathy. No other pathologically enlarged mediastinal lymph nodes. Mild soft tissue density within the right hilum without frank adenopathy. Mildly enlarged 11 mm left hilar node (series 3, image 28). Enlarged axillary adenopathy seen bilaterally, with the largest node on the left measuring 2.1 cm in short axis (series 3, image 16). Largest node on the right measures 1.7 cm in short axis (series 3, image 8). Scattered soft tissue stranding with emphysema and fluid density overlying the left axilla likely related to recent lymph node biopsy, partially visualized. Esophagus within normal limits. Lungs/Pleura: Tracheobronchial tree intact and patent. Moderate layering bilateral pleural effusions with associated atelectasis. No other airspace consolidation. No pulmonary edema. No pneumothorax. 4 mm subpleural ground-glass nodule present at the anterior right upper lobe (series 4, image 57), indeterminate. No other worrisome pulmonary nodule or mass. Musculoskeletal: No acute osseous finding. No discrete lytic or blastic osseous lesions. CT ABDOMEN PELVIS FINDINGS Hepatobiliary: Liver demonstrates a normal contrast enhanced appearance. Gallbladder within normal limits. No biliary dilatation. Pancreas: Pancreas within normal limits. Spleen: Spleen enlarged measuring 14.3 cm in craniocaudad dimension. Few subtle hypodensities noted within the spleen, largest of  which measures approximately 12 mm (series 3, image 57), indeterminate. Adrenals/Urinary Tract: Adrenal glands are normal. Kidneys equal in size with symmetric enhancement. No nephrolithiasis, hydronephrosis, or focal enhancing renal mass. No hydroureter. Bladder within normal limits. Stomach/Bowel: Stomach within normal limits. No evidence for bowel obstruction. Normal appendix. No acute inflammatory changes seen about the bowels. Vascular/Lymphatic: Normal intravascular enhancement seen throughout the intra-abdominal aorta. Mild aorto bi-iliac atherosclerotic disease. No aneurysm. Mesenteric vessels patent proximally. Enlarged 15 mm aortocaval lymph node (series 3, image 7). Additional multifocal shotty subcentimeter aortocaval and periaortic lymph nodes noted. No other pathologically enlarged intra-abdominal lymph nodes identified. Mildly prominent 12 mm left inguinal lymph nodes noted. No other adenopathy within the pelvis. Reproductive: Uterus within normal limits.  Ovaries normal. Other: Moderate volume free fluid within the pelvis, measuring simple fluid density. No free intraperitoneal air. Musculoskeletal: Scattered  anasarca noted within the external soft tissues. No acute osseous finding. No discrete lytic or blastic osseous lesions. IMPRESSION: CT NECK IMPRESSION 1. Increased number of shoddy subcentimeter lymph nodes throughout the neck bilaterally, left slightly worse than right. No pathologically enlarged lymph nodes identified. 2. Mild diffuse thyromegaly without discrete thyroid nodule or mass. 3. Mild soft tissue stranding with emphysema within the right lateral neck related to recent right-sided Port-A-Cath placement. CT CHEST IMPRESSION 1. Enlarged bilateral axillary and upper mediastinal adenopathy as above, likely related to provided history of lymphoma. Additional borderline enlarged left hilar node may be related to lymphoma or possibly reactive in nature. Attention at follow-up recommended.  2. Moderate layering bilateral pleural effusions with associated atelectasis. 3. Small to moderate pericardial effusion. 4. 4 mm right upper lobe ground-glass nodule, indeterminate. Attention at follow-up recommended. CT ABDOMEN AND PELVIS IMPRESSION 1. Enlarged 15 mm aortocaval lymph node with mildly enlarged 12 mm left inguinal lymph nodes. Additional increased number of shotty subcentimeter retroperitoneal lymph nodes. Findings most likely related to history of lymphoma. 2. Splenomegaly. 3. Moderate volume free fluid within the pelvis, of uncertain etiology, but could be physiologic and/or related overall volume status. 4. Mild diffuse anasarca. Electronically Signed   By: Jeannine Boga M.D.   On: 11/18/2018 19:56   Ct Abdomen Pelvis W Contrast  Result Date: 11/18/2018 CLINICAL DATA:  Initial evaluation for Hodgkin's lymphoma, initial workup. EXAM: CT NECK WITH CONTRAST CT CHEST, ABDOMEN, AND PELVIS WITH CONTRAST TECHNIQUE: Multidetector CT imaging of the chest, abdomen and pelvis was performed following the standard protocol during bolus administration of intravenous contrast. CONTRAST:  157m OMNIPAQUE IOHEXOL 300 MG/ML SOLN, 34mOMNIPAQUE IOHEXOL 300 MG/ML SOLN COMPARISON:  None. FINDINGS: CT NECK FINDINGS: Pharynx and larynx: Oral cavity within normal limits without mass lesion or loculated collection. Patient is largely edentulous. Calcified tonsilliths noted within the right palatine tonsil. Tonsils themselves symmetric and within normal limits bilaterally. Parapharyngeal fat maintained. Nasopharynx normal. No retropharyngeal collection. Epiglottis normal. Vallecula clear. Remainder of the hypopharynx and supraglottic larynx normal. Glottis within normal limits. Subglottic airway clear. Salivary glands: Parotid and submandibular glands within normal limits. Thyroid: Thyroid diffusely enlarged without discrete nodule or mass. Lymph nodes: Mildly prominent level II lymph nodes measure up to 9 mm  in short axis bilaterally. Left level III nodes measure up to 9 mm as well. Increased number of shotty subcentimeter nodes within the neck bilaterally, left slightly worse than right. No other pathologically enlarged lymph nodes identified within the neck. Vascular: Normal intravascular enhancement seen throughout the neck. Right IJ approach central venous catheter in place. Soft tissue swelling with stranding and scattered foci of soft tissue emphysema within the right neck likely related to central line placement. Limited intracranial: Unremarkable Visualized orbits: Partially visualized inferior globes and orbits unremarkable. Mastoids and visualized paranasal sinuses: Mucosal thickening noted within the visualized maxillary and sphenoid sinuses. Visualized paranasal sinuses are otherwise clear. Visualize mastoids and middle ear cavities are clear. Skeleton: No acute osseous abnormality. No discrete lytic or blastic osseous lesions. Mild cervical spondylolysis present at C5-6. Other: None. CT CHEST FINDINGS Cardiovascular: Normal intravascular enhancement seen throughout the intra-abdominal aorta without aneurysm or other acute abnormality. Minimal atheromatous plaque within the aortic arch. Visualized great vessels within normal limits. Heart size normal. Small moderate pericardial effusion, simple fluid density. Mediastinum/Nodes: Enlarged nodes within the upper mediastinum measure up to 15 mm in short axis (series 3, image 11). No appreciable supraclavicular adenopathy. No other pathologically enlarged mediastinal lymph nodes. Mild  soft tissue density within the right hilum without frank adenopathy. Mildly enlarged 11 mm left hilar node (series 3, image 28). Enlarged axillary adenopathy seen bilaterally, with the largest node on the left measuring 2.1 cm in short axis (series 3, image 16). Largest node on the right measures 1.7 cm in short axis (series 3, image 8). Scattered soft tissue stranding with  emphysema and fluid density overlying the left axilla likely related to recent lymph node biopsy, partially visualized. Esophagus within normal limits. Lungs/Pleura: Tracheobronchial tree intact and patent. Moderate layering bilateral pleural effusions with associated atelectasis. No other airspace consolidation. No pulmonary edema. No pneumothorax. 4 mm subpleural ground-glass nodule present at the anterior right upper lobe (series 4, image 57), indeterminate. No other worrisome pulmonary nodule or mass. Musculoskeletal: No acute osseous finding. No discrete lytic or blastic osseous lesions. CT ABDOMEN PELVIS FINDINGS Hepatobiliary: Liver demonstrates a normal contrast enhanced appearance. Gallbladder within normal limits. No biliary dilatation. Pancreas: Pancreas within normal limits. Spleen: Spleen enlarged measuring 14.3 cm in craniocaudad dimension. Few subtle hypodensities noted within the spleen, largest of which measures approximately 12 mm (series 3, image 57), indeterminate. Adrenals/Urinary Tract: Adrenal glands are normal. Kidneys equal in size with symmetric enhancement. No nephrolithiasis, hydronephrosis, or focal enhancing renal mass. No hydroureter. Bladder within normal limits. Stomach/Bowel: Stomach within normal limits. No evidence for bowel obstruction. Normal appendix. No acute inflammatory changes seen about the bowels. Vascular/Lymphatic: Normal intravascular enhancement seen throughout the intra-abdominal aorta. Mild aorto bi-iliac atherosclerotic disease. No aneurysm. Mesenteric vessels patent proximally. Enlarged 15 mm aortocaval lymph node (series 3, image 7). Additional multifocal shotty subcentimeter aortocaval and periaortic lymph nodes noted. No other pathologically enlarged intra-abdominal lymph nodes identified. Mildly prominent 12 mm left inguinal lymph nodes noted. No other adenopathy within the pelvis. Reproductive: Uterus within normal limits.  Ovaries normal. Other: Moderate  volume free fluid within the pelvis, measuring simple fluid density. No free intraperitoneal air. Musculoskeletal: Scattered anasarca noted within the external soft tissues. No acute osseous finding. No discrete lytic or blastic osseous lesions. IMPRESSION: CT NECK IMPRESSION 1. Increased number of shoddy subcentimeter lymph nodes throughout the neck bilaterally, left slightly worse than right. No pathologically enlarged lymph nodes identified. 2. Mild diffuse thyromegaly without discrete thyroid nodule or mass. 3. Mild soft tissue stranding with emphysema within the right lateral neck related to recent right-sided Port-A-Cath placement. CT CHEST IMPRESSION 1. Enlarged bilateral axillary and upper mediastinal adenopathy as above, likely related to provided history of lymphoma. Additional borderline enlarged left hilar node may be related to lymphoma or possibly reactive in nature. Attention at follow-up recommended. 2. Moderate layering bilateral pleural effusions with associated atelectasis. 3. Small to moderate pericardial effusion. 4. 4 mm right upper lobe ground-glass nodule, indeterminate. Attention at follow-up recommended. CT ABDOMEN AND PELVIS IMPRESSION 1. Enlarged 15 mm aortocaval lymph node with mildly enlarged 12 mm left inguinal lymph nodes. Additional increased number of shotty subcentimeter retroperitoneal lymph nodes. Findings most likely related to history of lymphoma. 2. Splenomegaly. 3. Moderate volume free fluid within the pelvis, of uncertain etiology, but could be physiologic and/or related overall volume status. 4. Mild diffuse anasarca. Electronically Signed   By: Jeannine Boga M.D.   On: 11/18/2018 19:56   Ct Biopsy  Result Date: 11/15/2018 INDICATION: Pancytopenia, concern for lymphoproliferative process EXAM: CT GUIDED RIGHT ILIAC BONE MARROW ASPIRATION AND CORE BIOPSY Date:  11/15/2018 11/15/2018 10:13 am Radiologist:  M. Daryll Brod, MD Guidance:  CT FLUOROSCOPY TIME:   Fluoroscopy Time: None. MEDICATIONS:  1% lidocaine local ANESTHESIA/SEDATION: 2.0 mg IV Versed; 50 mcg IV Fentanyl Moderate Sedation Time:  10 minutes The patient was continuously monitored during the procedure by the interventional radiology nurse under my direct supervision. CONTRAST:  None. COMPLICATIONS: None PROCEDURE: Informed consent was obtained from the patient following explanation of the procedure, risks, benefits and alternatives. The patient understands, agrees and consents for the procedure. All questions were addressed. A time out was performed. The patient was positioned prone and non-contrast localization CT was performed of the pelvis to demonstrate the iliac marrow spaces. Maximal barrier sterile technique utilized including caps, mask, sterile gowns, sterile gloves, large sterile drape, hand hygiene, and Betadine prep. Under sterile conditions and local anesthesia, an 11 gauge coaxial bone biopsy needle was advanced into the right iliac marrow space. Needle position was confirmed with CT imaging. Initially, bone marrow aspiration was performed. Next, the 11 gauge outer cannula was utilized to obtain a right iliac bone marrow core biopsy. Needle was removed. Hemostasis was obtained with compression. The patient tolerated the procedure well. Samples were prepared with the cytotechnologist. No immediate complications. IMPRESSION: CT guided right iliac bone marrow aspiration and core biopsy. Electronically Signed   By: Jerilynn Mages.  Shick M.D.   On: 11/15/2018 11:25   Dg Chest Port 1 View  Result Date: 11/25/2018 CLINICAL DATA:  Dyspnea. EXAM: PORTABLE CHEST 1 VIEW COMPARISON:  November 21, 2018 FINDINGS: The right Port-A-Cath is stable. Small effusion and left basilar opacity remain. Mild pulmonary venous congestion without overt edema. The cardiomediastinal silhouette is stable with cardiomegaly. IMPRESSION: Mild pulmonary venous congestion. Tiny left effusion with underlying opacity in left base, stable.  No other change. Electronically Signed   By: Dorise Bullion III M.D   On: 11/25/2018 13:02   Dg Chest Port 1 View  Result Date: 11/11/2018 CLINICAL DATA:  Fever EXAM: PORTABLE CHEST 1 VIEW COMPARISON:  None. FINDINGS: Mild cardiomegaly. No focal consolidation or effusion. No pneumothorax. IMPRESSION: No active disease. Electronically Signed   By: Donavan Foil M.D.   On: 11/11/2018 21:59   Ct Bone Marrow Biopsy & Aspiration  Result Date: 11/15/2018 INDICATION: Pancytopenia, concern for lymphoproliferative process EXAM: CT GUIDED RIGHT ILIAC BONE MARROW ASPIRATION AND CORE BIOPSY Date:  11/15/2018 11/15/2018 10:13 am Radiologist:  M. Daryll Brod, MD Guidance:  CT FLUOROSCOPY TIME:  Fluoroscopy Time: None. MEDICATIONS: 1% lidocaine local ANESTHESIA/SEDATION: 2.0 mg IV Versed; 50 mcg IV Fentanyl Moderate Sedation Time:  10 minutes The patient was continuously monitored during the procedure by the interventional radiology nurse under my direct supervision. CONTRAST:  None. COMPLICATIONS: None PROCEDURE: Informed consent was obtained from the patient following explanation of the procedure, risks, benefits and alternatives. The patient understands, agrees and consents for the procedure. All questions were addressed. A time out was performed. The patient was positioned prone and non-contrast localization CT was performed of the pelvis to demonstrate the iliac marrow spaces. Maximal barrier sterile technique utilized including caps, mask, sterile gowns, sterile gloves, large sterile drape, hand hygiene, and Betadine prep. Under sterile conditions and local anesthesia, an 11 gauge coaxial bone biopsy needle was advanced into the right iliac marrow space. Needle position was confirmed with CT imaging. Initially, bone marrow aspiration was performed. Next, the 11 gauge outer cannula was utilized to obtain a right iliac bone marrow core biopsy. Needle was removed. Hemostasis was obtained with compression. The patient  tolerated the procedure well. Samples were prepared with the cytotechnologist. No immediate complications. IMPRESSION: CT guided right iliac bone marrow aspiration  and core biopsy. Electronically Signed   By: Jerilynn Mages.  Shick M.D.   On: 11/15/2018 11:25   Ir Imaging Guided Port Insertion  Result Date: 11/18/2018 INDICATION: 53 year old female with history lymphoma EXAM: IMPLANTED PORT A CATH PLACEMENT WITH ULTRASOUND AND FLUOROSCOPIC GUIDANCE MEDICATIONS: 2.0 g Ancef; The antibiotic was administered within an appropriate time interval prior to skin puncture. ANESTHESIA/SEDATION: Moderate (conscious) sedation was employed during this procedure. A total of Versed 2.0 mg and Fentanyl 100 mcg was administered intravenously. Moderate Sedation Time: 18 minutes. The patient's level of consciousness and vital signs were monitored continuously by radiology nursing throughout the procedure under my direct supervision. FLUOROSCOPY TIME:  0 minutes, 12 seconds (1.0 mGy) COMPLICATIONS: None PROCEDURE: The procedure, risks, benefits, and alternatives were explained to the patient. Questions regarding the procedure were encouraged and answered. The patient understands and consents to the procedure. Ultrasound survey was performed with images stored and sent to PACs. The right neck and chest was prepped with chlorhexidine, and draped in the usual sterile fashion using maximum barrier technique (cap and mask, sterile gown, sterile gloves, large sterile sheet, hand hygiene and cutaneous antiseptic). Antibiotic prophylaxis was provided with 2.0g Ancef administered IV one hour prior to skin incision. Local anesthesia was attained by infiltration with 1% lidocaine without epinephrine. Ultrasound demonstrated patency of the right internal jugular vein, and this was documented with an image. Under real-time ultrasound guidance, this vein was accessed with a 21 gauge micropuncture needle and image documentation was performed. A small  dermatotomy was made at the access site with an 11 scalpel. A 0.018" wire was advanced into the SVC and used to estimate the length of the internal catheter. The access needle exchanged for a 52F micropuncture vascular sheath. The 0.018" wire was then removed and a 0.035" wire advanced into the IVC. An appropriate location for the subcutaneous reservoir was selected below the clavicle and an incision was made through the skin and underlying soft tissues. The subcutaneous tissues were then dissected using a combination of blunt and sharp surgical technique and a pocket was formed. A single lumen power injectable portacatheter was then tunneled through the subcutaneous tissues from the pocket to the dermatotomy and the port reservoir placed within the subcutaneous pocket. The venous access site was then serially dilated and a peel away vascular sheath placed over the wire. The wire was removed and the port catheter advanced into position under fluoroscopic guidance. The catheter tip is positioned in the cavoatrial junction. This was documented with a spot image. The portacatheter was then tested and found to flush and aspirate well. The port was flushed with saline followed by 100 units/mL heparinized saline. The pocket was then closed in two layers using first subdermal inverted interrupted absorbable sutures followed by a running subcuticular suture. The epidermis was then sealed with Dermabond. The dermatotomy at the venous access site was also seal with Dermabond. Patient tolerated the procedure well and remained hemodynamically stable throughout. No complications encountered and no significant blood loss encountered IMPRESSION: Status post right IJ port catheter placement. Catheter ready for use. Signed, Dulcy Fanny. Dellia Nims, RPVI [facility] Radiology Electronically Signed   By: Corrie Mckusick D.O.   On: 11/18/2018 11:23    Lab Data:  CBC: Recent Labs  Lab  11/22/18 0609 11/23/18 0610 11/24/18 0600 11/25/18 0554 11/26/18 0619  WBC 5.5 1.1* 0.5* 0.2* 0.2*  NEUTROABS  --  0.9* 0.2*  --   --   HGB 6.3* 9.2*  9.4* 9.1* 8.2*  HCT 20.1* 27.7* 29.6* 29.0* 24.7*  MCV 87.4 86.3 88.9 89.0 85.2  PLT 32* 26* 28* 19* 18*   Basic Metabolic Panel: Recent Labs  Lab 11/23/18 0610 11/23/18 1000 11/23/18 1502 11/24/18 0634 11/25/18 0554 11/25/18 1540 11/26/18 0452 11/26/18 0619  NA 138  --   --  140 142 145 147*  --   K 2.7*  --  3.1* 2.8* 2.8* 3.3* 2.7*  --   CL 104  --   --  105 109 113* 110  --   CO2 21*  --   --  15* 14* 18* 25  --   GLUCOSE 91  --   --  80 129* 174* 189*  --   BUN 14  --   --  6 <5* <5* <5*  --   CREATININE 0.51  --   --  0.67 0.59 0.48 0.53  --   CALCIUM 7.3*  --   --  7.5* 6.8* 6.4* 4.4*  --   MG  --  2.0  --   --  1.9  --   --  1.7   GFR: Estimated Creatinine Clearance: 82 mL/min (by C-G formula based on SCr of 0.53 mg/dL). Liver Function Tests: Recent Labs  Lab 11/23/18 0610 11/25/18 0920  AST 32  --   ALT 25  --   ALKPHOS 126  --   BILITOT 1.2  --   PROT 4.9*  --   ALBUMIN 1.7* 2.1*   No results for input(s): LIPASE, AMYLASE in the last 168 hours. Recent Labs  Lab 11/25/18 1228  AMMONIA 24   Coagulation Profile: No results for input(s): INR, PROTIME in the last 168 hours. Cardiac Enzymes: No results for input(s): CKTOTAL, CKMB, CKMBINDEX, TROPONINI in the last 168 hours. BNP (last 3 results) No results for input(s): PROBNP in the last 8760 hours. HbA1C: No results for input(s): HGBA1C in the last 72 hours. CBG: Recent Labs  Lab 11/20/18 2153  GLUCAP 92   Lipid Profile: No results for input(s): CHOL, HDL, LDLCALC, TRIG, CHOLHDL, LDLDIRECT in the last 72 hours. Thyroid Function Tests: No results for input(s): TSH, T4TOTAL, FREET4, T3FREE, THYROIDAB in the last 72 hours. Anemia Panel: No results for input(s): VITAMINB12, FOLATE, FERRITIN, TIBC, IRON, RETICCTPCT in the last 72 hours. Urine  analysis:    Component Value Date/Time   COLORURINE YELLOW 11/14/2018 1619   APPEARANCEUR HAZY (A) 11/14/2018 1619   LABSPEC 1.016 11/14/2018 1619   PHURINE 5.0 11/14/2018 1619   GLUCOSEU NEGATIVE 11/14/2018 1619   HGBUR SMALL (A) 11/14/2018 1619   BILIRUBINUR NEGATIVE 11/14/2018 1619   KETONESUR NEGATIVE 11/14/2018 1619   PROTEINUR 30 (A) 11/14/2018 1619   NITRITE NEGATIVE 11/14/2018 1619   LEUKOCYTESUR NEGATIVE 11/14/2018 1619     Ripudeep Rai M.D. [facility] 11/26/2018, 2:25 PM  Pager: [phone] Between 7am to 7pm - call Pager - [phone]  After 7pm go to [email or web address] - password [email or web address]  Call night coverage person covering after 7pm

## 2018-11-26 NOTE — Progress Notes (Signed)
[place] INPATIENT PROGRESS NOTE  SUBJECTIVE: Cindy Ward 53 y.o. female with Stage IV Hodgkin lymphoma. Received Day 1 Cycle 1 AVD+ Adcedrison 11/19/2018. Continues to have fevers. Blood cultures drawn on 11/25/2018 positive for E Coli and Group B Strep (S. Agalactiae) in 4/4 bottles. Cefepime discontinued. Started on Ceftriaxone. Vancomycin continued. Remains on fluconazole and Nystatin for thrush.  Having pain and difficulty swallowing.  Denies chest pain and shortness of breath. Has abdominal discomfort and diarrhea. No nausea or vomiting.  Denies bleeding.  ALLERGIES:  has No Known Allergies.  MEDICATIONS:  Current Facility-Administered Medications  Medication Dose Route Frequency Provider Last Rate Last Dose  . 0.9 %  sodium chloride infusion   Intravenous PRN Mendel Corning, MD   Stopped at 11/25/18 1936  . 0.9 %  sodium chloride infusion   Intravenous Once Rai, Ripudeep K, MD      . acetaminophen (TYLENOL) tablet 650 mg  650 mg Oral Q6H PRN Rai, Ripudeep K, MD   650 mg at 11/25/18 0811   Or  . acetaminophen (TYLENOL) suppository 650 mg  650 mg Rectal Q6H PRN Rai, Ripudeep K, MD   650 mg at 11/25/18 1509  . cefTRIAXone (ROCEPHIN) 2 g in sodium chloride 0.9 % 100 mL IVPB  2 g Intravenous QHS Dorrene German, [facility]   Stopped at 11/26/18 0235  . Chlorhexidine Gluconate Cloth 2 % PADS 6 each  6 each Topical Daily Rai, Ripudeep K, MD   6 each at 11/26/18 0735  . feeding supplement (BOOST / RESOURCE BREEZE) liquid 1 Container  1 Container Oral Q24H Rai, Ripudeep K, MD      . fluconazole (DIFLUCAN) IVPB 200 mg  200 mg Intravenous Q24H Rai, Ripudeep K, MD   Stopped at 11/25/18 1315  . HYDROmorphone (DILAUDID) injection 0.5-1 mg  0.5-1 mg Intravenous Q4H PRN Rai, Ripudeep K, MD   1 mg at 11/25/18 1357  . ibuprofen (ADVIL,MOTRIN) tablet 400 mg  400 mg Oral Q8H PRN Rai, Ripudeep K, MD   400 mg at 11/18/18 2334  . iohexol (OMNIPAQUE) 300 MG/ML solution 15 mL  15 mL Oral Once  PRN Rai, Ripudeep K, MD   30 mL at 11/18/18 1740  . levothyroxine (SYNTHROID, LEVOTHROID) injection 37.5 mcg  37.5 mcg Intravenous Daily Rai, Ripudeep K, MD   37.5 mcg at 11/25/18 0951  . lip balm (CARMEX) ointment   Topical PRN Rai, Ripudeep K, MD      . MEDLINE mouth rinse  15 mL Mouth Rinse BID Rai, Ripudeep K, MD   15 mL at 11/25/18 2142  . multivitamin with minerals tablet 1 tablet  1 tablet Oral Daily Rai, Ripudeep K, MD   1 tablet at 11/24/18 1105  . nystatin (MYCOSTATIN) 100000 UNIT/ML suspension 500,000 Units  5 mL Oral QID Rai, Ripudeep K, MD   500,000 Units at 11/25/18 2142  . ondansetron (ZOFRAN) tablet 4 mg  4 mg Oral Q6H PRN Rai, Ripudeep K, MD       Or  . ondansetron (ZOFRAN) injection 4 mg  4 mg Intravenous Q6H PRN Rai, Ripudeep K, MD   4 mg at 11/24/18 1149  . oxyCODONE (Oxy IR/ROXICODONE) immediate release tablet 5-10 mg  5-10 mg Oral Q4H PRN Rai, Ripudeep K, MD   5 mg at 11/22/18 0130  . phenol (CHLORASEPTIC) mouth spray 1 spray  1 spray Mouth/Throat PRN Rai, Ripudeep K, MD      . potassium chloride SA (K-DUR,KLOR-CON) CR tablet 40 mEq  40 mEq Oral Q6H Rai, Ripudeep K, MD      . protein supplement (UNJURY CHICKEN SOUP) powder 8 oz  8 oz Oral q12n4p Rai, Ripudeep K, MD   8 oz at 11/23/18 1022  . scopolamine (TRANSDERM-SCOP) 1 MG/3DAYS 1.5 mg  1 patch Transdermal Q72H Rai, Ripudeep K, MD   1.5 mg at 11/24/18 1327  . sodium bicarbonate 150 mEq in dextrose 5 % 1,000 mL infusion   Intravenous Continuous Olalere, Adewale A, MD 100 mL/hr at 11/26/18 0900    . sodium chloride flush (NS) 0.9 % injection 10-40 mL  10-40 mL Intracatheter PRN Rai, Ripudeep K, MD      . Tbo-Filgrastim [facility]) injection 480 mcg  480 mcg Subcutaneous q1800 Tish Men, MD   480 mcg at 11/25/18 1815    REVIEW OF SYSTEMS:   Review of Systems  Review of Systems  Constitutional: Positive for chills, fever, malaise/fatigue and weight loss.  HENT: Positive for sore throat.        Mucositis and oral thrush.   Eyes: Negative.   Respiratory: Negative.   Cardiovascular: Positive for leg swelling. Negative for chest pain.  Gastrointestinal: Positive for abdominal pain and diarrhea. Negative for nausea and vomiting.  Genitourinary: Negative.   Musculoskeletal: Negative.   Skin: Negative.   Neurological: Negative.   Endo/Heme/Allergies:       Bruises easily.  Denies bleeding.  Psychiatric/Behavioral: Negative.       PHYSICAL EXAMINATION:  Vital signs in last 24 hours: Temp:  [98.3 F (36.8 C)-100.8 F (38.2 C)] 98.9 F (37.2 C) (03/03 0800) Pulse Rate:  [99-123] 106 (03/03 0830) Resp:  [16-39] 31 (03/03 0900) BP: (125-151)/(53-82) 134/72 (03/03 0900) SpO2:  [92 %-100 %] 100 % (03/03 0900) Weight change:  Last BM Date: 11/25/18  Intake/Output from previous day: 03/02 0701 - 03/03 0700 In: 2511.4 [I.V.:737.5; Blood:232; IV Piggyback:1541.9] Out: 100 [Urine:100] General: Alert, awake without distress. Chronically ill appearing.  Head: Normocephalic atraumatic. Mouth: mucus membranes dry with mucositis and oral thrush noted.  Eyes: No scleral icterus.  Pupils are equal and round reactive to light. Resp: clear to auscultation bilaterally without rhonchi or wheezes or dullness to percussion. Cardio: Tachycardic. Regular rhythm, S1, S2 normal, no murmur, click, rub or gallop.  Trace bilateral pedal edema.  GI: soft, mild tenderness with palpation.  bowel sounds normal; no masses,  no organomegaly Musculoskeletal: No joint deformity or effusion. Neurological: No motor, sensory deficits.  Intact deep tendon reflexes. Skin: No rashes or lesions.  Portacath/PICC-without erythema  LABORATORY DATA: Lab Results  Component Value Date   WBC 0.2 (LL) 11/26/2018   HGB 8.2 (L) 11/26/2018   HCT 24.7 (L) 11/26/2018   MCV 85.2 11/26/2018   PLT 18 (LL) 11/26/2018    CMP Latest Ref Rng & Units 11/26/2018 11/25/2018 11/25/2018  Glucose 70 - 99 mg/dL 189(H) 174(H) 129(H)  BUN 6 - 20 mg/dL <5(L) <5(L)  <5(L)  Creatinine 0.44 - 1.00 mg/dL 0.53 0.48 0.59  Sodium 135 - 145 mmol/L 147(H) 145 142  Potassium 3.5 - 5.1 mmol/L 2.7(LL) 3.3(L) 2.8(L)  Chloride 98 - 111 mmol/L 110 113(H) 109  CO2 22 - 32 mmol/L 25 18(L) 14(L)  Calcium 8.9 - 10.3 mg/dL 4.4(LL) 6.4(LL) 6.8(L)  Total Protein 6.5 - 8.1 g/dL - - -  Total Bilirubin 0.3 - 1.2 mg/dL - - -  Alkaline Phos 38 - 126 U/L - - -  AST 15 - 41 U/L - - -  ALT 0 - 44 U/L - - -  RADIOGRAPHIC STUDIES:  Dg Chest 2 View  Result Date: 11/21/2018 CLINICAL DATA:  History of lymphoma.  Patient weak and flushed. EXAM: CHEST - 2 VIEW COMPARISON:  Chest CT, 11/18/2018.  Chest radiographs, 11/11/2018. FINDINGS: Mild enlargement of the cardiopericardial silhouette. No mediastinal or hilar masses. No convincing adenopathy. Small bilateral pleural effusions. Mild dependent lower lobe atelectasis. Lungs otherwise clear. No pneumothorax. Right anterior chest wall, internal jugular, Port-A-Cath is stable from the prior chest CT. Skeletal structures are unremarkable. IMPRESSION: 1. Findings are similar to the recent prior chest CT. Enlargement of the cardiopericardial silhouette is consistent with the pericardial effusion noted on CT. There are small bilateral pleural effusions with associated lower lobe atelectasis. No convincing pneumonia and no pulmonary edema. Electronically Signed   By: Lajean Manes M.D.   On: 11/21/2018 15:05   Ct Head Wo Contrast  Result Date: 11/25/2018 CLINICAL DATA:  Hodgkin's lymphoma, fevers, headache EXAM: CT HEAD WITHOUT CONTRAST TECHNIQUE: Contiguous axial images were obtained from the base of the skull through the vertex without intravenous contrast. COMPARISON:  11/25/2018 FINDINGS: Brain: No evidence of acute infarction, hemorrhage, hydrocephalus, extra-axial collection or mass lesion/mass effect. Mild periventricular white matter hypodensity. Vascular: No hyperdense vessel or unexpected calcification. Skull: Normal. Negative for  fracture or focal lesion. Sinuses/Orbits: No acute finding. Other: None. IMPRESSION: No acute intracranial pathology. No non-contrast CT findings to explain headache. Mild small-vessel white matter disease. Electronically Signed   By: Eddie Candle M.D.   On: 11/25/2018 12:37   Ct Soft Tissue Neck W Contrast  Result Date: 11/18/2018 CLINICAL DATA:  Initial evaluation for Hodgkin's lymphoma, initial workup. EXAM: CT NECK WITH CONTRAST CT CHEST, ABDOMEN, AND PELVIS WITH CONTRAST TECHNIQUE: Multidetector CT imaging of the chest, abdomen and pelvis was performed following the standard protocol during bolus administration of intravenous contrast. CONTRAST:  134m OMNIPAQUE IOHEXOL 300 MG/ML SOLN, 33mOMNIPAQUE IOHEXOL 300 MG/ML SOLN COMPARISON:  None. FINDINGS: CT NECK FINDINGS: Pharynx and larynx: Oral cavity within normal limits without mass lesion or loculated collection. Patient is largely edentulous. Calcified tonsilliths noted within the right palatine tonsil. Tonsils themselves symmetric and within normal limits bilaterally. Parapharyngeal fat maintained. Nasopharynx normal. No retropharyngeal collection. Epiglottis normal. Vallecula clear. Remainder of the hypopharynx and supraglottic larynx normal. Glottis within normal limits. Subglottic airway clear. Salivary glands: Parotid and submandibular glands within normal limits. Thyroid: Thyroid diffusely enlarged without discrete nodule or mass. Lymph nodes: Mildly prominent level II lymph nodes measure up to 9 mm in short axis bilaterally. Left level III nodes measure up to 9 mm as well. Increased number of shotty subcentimeter nodes within the neck bilaterally, left slightly worse than right. No other pathologically enlarged lymph nodes identified within the neck. Vascular: Normal intravascular enhancement seen throughout the neck. Right IJ approach central venous catheter in place. Soft tissue swelling with stranding and scattered foci of soft tissue emphysema  within the right neck likely related to central line placement. Limited intracranial: Unremarkable Visualized orbits: Partially visualized inferior globes and orbits unremarkable. Mastoids and visualized paranasal sinuses: Mucosal thickening noted within the visualized maxillary and sphenoid sinuses. Visualized paranasal sinuses are otherwise clear. Visualize mastoids and middle ear cavities are clear. Skeleton: No acute osseous abnormality. No discrete lytic or blastic osseous lesions. Mild cervical spondylolysis present at C5-6. Other: None. CT CHEST FINDINGS Cardiovascular: Normal intravascular enhancement seen throughout the intra-abdominal aorta without aneurysm or other acute abnormality. Minimal atheromatous plaque within the aortic arch. Visualized great vessels within normal limits. Heart size normal. Small  moderate pericardial effusion, simple fluid density. Mediastinum/Nodes: Enlarged nodes within the upper mediastinum measure up to 15 mm in short axis (series 3, image 11). No appreciable supraclavicular adenopathy. No other pathologically enlarged mediastinal lymph nodes. Mild soft tissue density within the right hilum without frank adenopathy. Mildly enlarged 11 mm left hilar node (series 3, image 28). Enlarged axillary adenopathy seen bilaterally, with the largest node on the left measuring 2.1 cm in short axis (series 3, image 16). Largest node on the right measures 1.7 cm in short axis (series 3, image 8). Scattered soft tissue stranding with emphysema and fluid density overlying the left axilla likely related to recent lymph node biopsy, partially visualized. Esophagus within normal limits. Lungs/Pleura: Tracheobronchial tree intact and patent. Moderate layering bilateral pleural effusions with associated atelectasis. No other airspace consolidation. No pulmonary edema. No pneumothorax. 4 mm subpleural ground-glass nodule present at the anterior right upper lobe (series 4, image 57), indeterminate.  No other worrisome pulmonary nodule or mass. Musculoskeletal: No acute osseous finding. No discrete lytic or blastic osseous lesions. CT ABDOMEN PELVIS FINDINGS Hepatobiliary: Liver demonstrates a normal contrast enhanced appearance. Gallbladder within normal limits. No biliary dilatation. Pancreas: Pancreas within normal limits. Spleen: Spleen enlarged measuring 14.3 cm in craniocaudad dimension. Few subtle hypodensities noted within the spleen, largest of which measures approximately 12 mm (series 3, image 57), indeterminate. Adrenals/Urinary Tract: Adrenal glands are normal. Kidneys equal in size with symmetric enhancement. No nephrolithiasis, hydronephrosis, or focal enhancing renal mass. No hydroureter. Bladder within normal limits. Stomach/Bowel: Stomach within normal limits. No evidence for bowel obstruction. Normal appendix. No acute inflammatory changes seen about the bowels. Vascular/Lymphatic: Normal intravascular enhancement seen throughout the intra-abdominal aorta. Mild aorto bi-iliac atherosclerotic disease. No aneurysm. Mesenteric vessels patent proximally. Enlarged 15 mm aortocaval lymph node (series 3, image 7). Additional multifocal shotty subcentimeter aortocaval and periaortic lymph nodes noted. No other pathologically enlarged intra-abdominal lymph nodes identified. Mildly prominent 12 mm left inguinal lymph nodes noted. No other adenopathy within the pelvis. Reproductive: Uterus within normal limits.  Ovaries normal. Other: Moderate volume free fluid within the pelvis, measuring simple fluid density. No free intraperitoneal air. Musculoskeletal: Scattered anasarca noted within the external soft tissues. No acute osseous finding. No discrete lytic or blastic osseous lesions. IMPRESSION: CT NECK IMPRESSION 1. Increased number of shoddy subcentimeter lymph nodes throughout the neck bilaterally, left slightly worse than right. No pathologically enlarged lymph nodes identified. 2. Mild diffuse  thyromegaly without discrete thyroid nodule or mass. 3. Mild soft tissue stranding with emphysema within the right lateral neck related to recent right-sided Port-A-Cath placement. CT CHEST IMPRESSION 1. Enlarged bilateral axillary and upper mediastinal adenopathy as above, likely related to provided history of lymphoma. Additional borderline enlarged left hilar node may be related to lymphoma or possibly reactive in nature. Attention at follow-up recommended. 2. Moderate layering bilateral pleural effusions with associated atelectasis. 3. Small to moderate pericardial effusion. 4. 4 mm right upper lobe ground-glass nodule, indeterminate. Attention at follow-up recommended. CT ABDOMEN AND PELVIS IMPRESSION 1. Enlarged 15 mm aortocaval lymph node with mildly enlarged 12 mm left inguinal lymph nodes. Additional increased number of shotty subcentimeter retroperitoneal lymph nodes. Findings most likely related to history of lymphoma. 2. Splenomegaly. 3. Moderate volume free fluid within the pelvis, of uncertain etiology, but could be physiologic and/or related overall volume status. 4. Mild diffuse anasarca. Electronically Signed   By: Jeannine Boga M.D.   On: 11/18/2018 19:56   Ct Chest W Contrast  Result Date: 11/18/2018 CLINICAL  DATA:  Initial evaluation for Hodgkin's lymphoma, initial workup. EXAM: CT NECK WITH CONTRAST CT CHEST, ABDOMEN, AND PELVIS WITH CONTRAST TECHNIQUE: Multidetector CT imaging of the chest, abdomen and pelvis was performed following the standard protocol during bolus administration of intravenous contrast. CONTRAST:  19m OMNIPAQUE IOHEXOL 300 MG/ML SOLN, 317mOMNIPAQUE IOHEXOL 300 MG/ML SOLN COMPARISON:  None. FINDINGS: CT NECK FINDINGS: Pharynx and larynx: Oral cavity within normal limits without mass lesion or loculated collection. Patient is largely edentulous. Calcified tonsilliths noted within the right palatine tonsil. Tonsils themselves symmetric and within normal limits  bilaterally. Parapharyngeal fat maintained. Nasopharynx normal. No retropharyngeal collection. Epiglottis normal. Vallecula clear. Remainder of the hypopharynx and supraglottic larynx normal. Glottis within normal limits. Subglottic airway clear. Salivary glands: Parotid and submandibular glands within normal limits. Thyroid: Thyroid diffusely enlarged without discrete nodule or mass. Lymph nodes: Mildly prominent level II lymph nodes measure up to 9 mm in short axis bilaterally. Left level III nodes measure up to 9 mm as well. Increased number of shotty subcentimeter nodes within the neck bilaterally, left slightly worse than right. No other pathologically enlarged lymph nodes identified within the neck. Vascular: Normal intravascular enhancement seen throughout the neck. Right IJ approach central venous catheter in place. Soft tissue swelling with stranding and scattered foci of soft tissue emphysema within the right neck likely related to central line placement. Limited intracranial: Unremarkable Visualized orbits: Partially visualized inferior globes and orbits unremarkable. Mastoids and visualized paranasal sinuses: Mucosal thickening noted within the visualized maxillary and sphenoid sinuses. Visualized paranasal sinuses are otherwise clear. Visualize mastoids and middle ear cavities are clear. Skeleton: No acute osseous abnormality. No discrete lytic or blastic osseous lesions. Mild cervical spondylolysis present at C5-6. Other: None. CT CHEST FINDINGS Cardiovascular: Normal intravascular enhancement seen throughout the intra-abdominal aorta without aneurysm or other acute abnormality. Minimal atheromatous plaque within the aortic arch. Visualized great vessels within normal limits. Heart size normal. Small moderate pericardial effusion, simple fluid density. Mediastinum/Nodes: Enlarged nodes within the upper mediastinum measure up to 15 mm in short axis (series 3, image 11). No appreciable supraclavicular  adenopathy. No other pathologically enlarged mediastinal lymph nodes. Mild soft tissue density within the right hilum without frank adenopathy. Mildly enlarged 11 mm left hilar node (series 3, image 28). Enlarged axillary adenopathy seen bilaterally, with the largest node on the left measuring 2.1 cm in short axis (series 3, image 16). Largest node on the right measures 1.7 cm in short axis (series 3, image 8). Scattered soft tissue stranding with emphysema and fluid density overlying the left axilla likely related to recent lymph node biopsy, partially visualized. Esophagus within normal limits. Lungs/Pleura: Tracheobronchial tree intact and patent. Moderate layering bilateral pleural effusions with associated atelectasis. No other airspace consolidation. No pulmonary edema. No pneumothorax. 4 mm subpleural ground-glass nodule present at the anterior right upper lobe (series 4, image 57), indeterminate. No other worrisome pulmonary nodule or mass. Musculoskeletal: No acute osseous finding. No discrete lytic or blastic osseous lesions. CT ABDOMEN PELVIS FINDINGS Hepatobiliary: Liver demonstrates a normal contrast enhanced appearance. Gallbladder within normal limits. No biliary dilatation. Pancreas: Pancreas within normal limits. Spleen: Spleen enlarged measuring 14.3 cm in craniocaudad dimension. Few subtle hypodensities noted within the spleen, largest of which measures approximately 12 mm (series 3, image 57), indeterminate. Adrenals/Urinary Tract: Adrenal glands are normal. Kidneys equal in size with symmetric enhancement. No nephrolithiasis, hydronephrosis, or focal enhancing renal mass. No hydroureter. Bladder within normal limits. Stomach/Bowel: Stomach within normal limits. No evidence for bowel  obstruction. Normal appendix. No acute inflammatory changes seen about the bowels. Vascular/Lymphatic: Normal intravascular enhancement seen throughout the intra-abdominal aorta. Mild aorto bi-iliac atherosclerotic  disease. No aneurysm. Mesenteric vessels patent proximally. Enlarged 15 mm aortocaval lymph node (series 3, image 7). Additional multifocal shotty subcentimeter aortocaval and periaortic lymph nodes noted. No other pathologically enlarged intra-abdominal lymph nodes identified. Mildly prominent 12 mm left inguinal lymph nodes noted. No other adenopathy within the pelvis. Reproductive: Uterus within normal limits.  Ovaries normal. Other: Moderate volume free fluid within the pelvis, measuring simple fluid density. No free intraperitoneal air. Musculoskeletal: Scattered anasarca noted within the external soft tissues. No acute osseous finding. No discrete lytic or blastic osseous lesions. IMPRESSION: CT NECK IMPRESSION 1. Increased number of shoddy subcentimeter lymph nodes throughout the neck bilaterally, left slightly worse than right. No pathologically enlarged lymph nodes identified. 2. Mild diffuse thyromegaly without discrete thyroid nodule or mass. 3. Mild soft tissue stranding with emphysema within the right lateral neck related to recent right-sided Port-A-Cath placement. CT CHEST IMPRESSION 1. Enlarged bilateral axillary and upper mediastinal adenopathy as above, likely related to provided history of lymphoma. Additional borderline enlarged left hilar node may be related to lymphoma or possibly reactive in nature. Attention at follow-up recommended. 2. Moderate layering bilateral pleural effusions with associated atelectasis. 3. Small to moderate pericardial effusion. 4. 4 mm right upper lobe ground-glass nodule, indeterminate. Attention at follow-up recommended. CT ABDOMEN AND PELVIS IMPRESSION 1. Enlarged 15 mm aortocaval lymph node with mildly enlarged 12 mm left inguinal lymph nodes. Additional increased number of shotty subcentimeter retroperitoneal lymph nodes. Findings most likely related to history of lymphoma. 2. Splenomegaly. 3. Moderate volume free fluid within the pelvis, of uncertain etiology,  but could be physiologic and/or related overall volume status. 4. Mild diffuse anasarca. Electronically Signed   By: Jeannine Boga M.D.   On: 11/18/2018 19:56   Ct Abdomen Pelvis W Contrast  Result Date: 11/18/2018 CLINICAL DATA:  Initial evaluation for Hodgkin's lymphoma, initial workup. EXAM: CT NECK WITH CONTRAST CT CHEST, ABDOMEN, AND PELVIS WITH CONTRAST TECHNIQUE: Multidetector CT imaging of the chest, abdomen and pelvis was performed following the standard protocol during bolus administration of intravenous contrast. CONTRAST:  150m OMNIPAQUE IOHEXOL 300 MG/ML SOLN, 340mOMNIPAQUE IOHEXOL 300 MG/ML SOLN COMPARISON:  None. FINDINGS: CT NECK FINDINGS: Pharynx and larynx: Oral cavity within normal limits without mass lesion or loculated collection. Patient is largely edentulous. Calcified tonsilliths noted within the right palatine tonsil. Tonsils themselves symmetric and within normal limits bilaterally. Parapharyngeal fat maintained. Nasopharynx normal. No retropharyngeal collection. Epiglottis normal. Vallecula clear. Remainder of the hypopharynx and supraglottic larynx normal. Glottis within normal limits. Subglottic airway clear. Salivary glands: Parotid and submandibular glands within normal limits. Thyroid: Thyroid diffusely enlarged without discrete nodule or mass. Lymph nodes: Mildly prominent level II lymph nodes measure up to 9 mm in short axis bilaterally. Left level III nodes measure up to 9 mm as well. Increased number of shotty subcentimeter nodes within the neck bilaterally, left slightly worse than right. No other pathologically enlarged lymph nodes identified within the neck. Vascular: Normal intravascular enhancement seen throughout the neck. Right IJ approach central venous catheter in place. Soft tissue swelling with stranding and scattered foci of soft tissue emphysema within the right neck likely related to central line placement. Limited intracranial: Unremarkable Visualized  orbits: Partially visualized inferior globes and orbits unremarkable. Mastoids and visualized paranasal sinuses: Mucosal thickening noted within the visualized maxillary and sphenoid sinuses. Visualized paranasal sinuses are otherwise  clear. Visualize mastoids and middle ear cavities are clear. Skeleton: No acute osseous abnormality. No discrete lytic or blastic osseous lesions. Mild cervical spondylolysis present at C5-6. Other: None. CT CHEST FINDINGS Cardiovascular: Normal intravascular enhancement seen throughout the intra-abdominal aorta without aneurysm or other acute abnormality. Minimal atheromatous plaque within the aortic arch. Visualized great vessels within normal limits. Heart size normal. Small moderate pericardial effusion, simple fluid density. Mediastinum/Nodes: Enlarged nodes within the upper mediastinum measure up to 15 mm in short axis (series 3, image 11). No appreciable supraclavicular adenopathy. No other pathologically enlarged mediastinal lymph nodes. Mild soft tissue density within the right hilum without frank adenopathy. Mildly enlarged 11 mm left hilar node (series 3, image 28). Enlarged axillary adenopathy seen bilaterally, with the largest node on the left measuring 2.1 cm in short axis (series 3, image 16). Largest node on the right measures 1.7 cm in short axis (series 3, image 8). Scattered soft tissue stranding with emphysema and fluid density overlying the left axilla likely related to recent lymph node biopsy, partially visualized. Esophagus within normal limits. Lungs/Pleura: Tracheobronchial tree intact and patent. Moderate layering bilateral pleural effusions with associated atelectasis. No other airspace consolidation. No pulmonary edema. No pneumothorax. 4 mm subpleural ground-glass nodule present at the anterior right upper lobe (series 4, image 57), indeterminate. No other worrisome pulmonary nodule or mass. Musculoskeletal: No acute osseous finding. No discrete lytic or  blastic osseous lesions. CT ABDOMEN PELVIS FINDINGS Hepatobiliary: Liver demonstrates a normal contrast enhanced appearance. Gallbladder within normal limits. No biliary dilatation. Pancreas: Pancreas within normal limits. Spleen: Spleen enlarged measuring 14.3 cm in craniocaudad dimension. Few subtle hypodensities noted within the spleen, largest of which measures approximately 12 mm (series 3, image 57), indeterminate. Adrenals/Urinary Tract: Adrenal glands are normal. Kidneys equal in size with symmetric enhancement. No nephrolithiasis, hydronephrosis, or focal enhancing renal mass. No hydroureter. Bladder within normal limits. Stomach/Bowel: Stomach within normal limits. No evidence for bowel obstruction. Normal appendix. No acute inflammatory changes seen about the bowels. Vascular/Lymphatic: Normal intravascular enhancement seen throughout the intra-abdominal aorta. Mild aorto bi-iliac atherosclerotic disease. No aneurysm. Mesenteric vessels patent proximally. Enlarged 15 mm aortocaval lymph node (series 3, image 7). Additional multifocal shotty subcentimeter aortocaval and periaortic lymph nodes noted. No other pathologically enlarged intra-abdominal lymph nodes identified. Mildly prominent 12 mm left inguinal lymph nodes noted. No other adenopathy within the pelvis. Reproductive: Uterus within normal limits.  Ovaries normal. Other: Moderate volume free fluid within the pelvis, measuring simple fluid density. No free intraperitoneal air. Musculoskeletal: Scattered anasarca noted within the external soft tissues. No acute osseous finding. No discrete lytic or blastic osseous lesions. IMPRESSION: CT NECK IMPRESSION 1. Increased number of shoddy subcentimeter lymph nodes throughout the neck bilaterally, left slightly worse than right. No pathologically enlarged lymph nodes identified. 2. Mild diffuse thyromegaly without discrete thyroid nodule or mass. 3. Mild soft tissue stranding with emphysema within the  right lateral neck related to recent right-sided Port-A-Cath placement. CT CHEST IMPRESSION 1. Enlarged bilateral axillary and upper mediastinal adenopathy as above, likely related to provided history of lymphoma. Additional borderline enlarged left hilar node may be related to lymphoma or possibly reactive in nature. Attention at follow-up recommended. 2. Moderate layering bilateral pleural effusions with associated atelectasis. 3. Small to moderate pericardial effusion. 4. 4 mm right upper lobe ground-glass nodule, indeterminate. Attention at follow-up recommended. CT ABDOMEN AND PELVIS IMPRESSION 1. Enlarged 15 mm aortocaval lymph node with mildly enlarged 12 mm left inguinal lymph nodes. Additional increased number of  shotty subcentimeter retroperitoneal lymph nodes. Findings most likely related to history of lymphoma. 2. Splenomegaly. 3. Moderate volume free fluid within the pelvis, of uncertain etiology, but could be physiologic and/or related overall volume status. 4. Mild diffuse anasarca. Electronically Signed   By: Jeannine Boga M.D.   On: 11/18/2018 19:56   Ct Biopsy  Result Date: 11/15/2018 INDICATION: Pancytopenia, concern for lymphoproliferative process EXAM: CT GUIDED RIGHT ILIAC BONE MARROW ASPIRATION AND CORE BIOPSY Date:  11/15/2018 11/15/2018 10:13 am Radiologist:  M. Daryll Brod, MD Guidance:  CT FLUOROSCOPY TIME:  Fluoroscopy Time: None. MEDICATIONS: 1% lidocaine local ANESTHESIA/SEDATION: 2.0 mg IV Versed; 50 mcg IV Fentanyl Moderate Sedation Time:  10 minutes The patient was continuously monitored during the procedure by the interventional radiology nurse under my direct supervision. CONTRAST:  None. COMPLICATIONS: None PROCEDURE: Informed consent was obtained from the patient following explanation of the procedure, risks, benefits and alternatives. The patient understands, agrees and consents for the procedure. All questions were addressed. A time out was performed. The patient was  positioned prone and non-contrast localization CT was performed of the pelvis to demonstrate the iliac marrow spaces. Maximal barrier sterile technique utilized including caps, mask, sterile gowns, sterile gloves, large sterile drape, hand hygiene, and Betadine prep. Under sterile conditions and local anesthesia, an 11 gauge coaxial bone biopsy needle was advanced into the right iliac marrow space. Needle position was confirmed with CT imaging. Initially, bone marrow aspiration was performed. Next, the 11 gauge outer cannula was utilized to obtain a right iliac bone marrow core biopsy. Needle was removed. Hemostasis was obtained with compression. The patient tolerated the procedure well. Samples were prepared with the cytotechnologist. No immediate complications. IMPRESSION: CT guided right iliac bone marrow aspiration and core biopsy. Electronically Signed   By: Jerilynn Mages.  Shick M.D.   On: 11/15/2018 11:25   Dg Chest Port 1 View  Result Date: 11/25/2018 CLINICAL DATA:  Dyspnea. EXAM: PORTABLE CHEST 1 VIEW COMPARISON:  November 21, 2018 FINDINGS: The right Port-A-Cath is stable. Small effusion and left basilar opacity remain. Mild pulmonary venous congestion without overt edema. The cardiomediastinal silhouette is stable with cardiomegaly. IMPRESSION: Mild pulmonary venous congestion. Tiny left effusion with underlying opacity in left base, stable. No other change. Electronically Signed   By: Dorise Bullion III M.D   On: 11/25/2018 13:02   Dg Chest Port 1 View  Result Date: 11/11/2018 CLINICAL DATA:  Fever EXAM: PORTABLE CHEST 1 VIEW COMPARISON:  None. FINDINGS: Mild cardiomegaly. No focal consolidation or effusion. No pneumothorax. IMPRESSION: No active disease. Electronically Signed   By: Donavan Foil M.D.   On: 11/11/2018 21:59   Ct Bone Marrow Biopsy & Aspiration  Result Date: 11/15/2018 INDICATION: Pancytopenia, concern for lymphoproliferative process EXAM: CT GUIDED RIGHT ILIAC BONE MARROW ASPIRATION AND  CORE BIOPSY Date:  11/15/2018 11/15/2018 10:13 am Radiologist:  M. Daryll Brod, MD Guidance:  CT FLUOROSCOPY TIME:  Fluoroscopy Time: None. MEDICATIONS: 1% lidocaine local ANESTHESIA/SEDATION: 2.0 mg IV Versed; 50 mcg IV Fentanyl Moderate Sedation Time:  10 minutes The patient was continuously monitored during the procedure by the interventional radiology nurse under my direct supervision. CONTRAST:  None. COMPLICATIONS: None PROCEDURE: Informed consent was obtained from the patient following explanation of the procedure, risks, benefits and alternatives. The patient understands, agrees and consents for the procedure. All questions were addressed. A time out was performed. The patient was positioned prone and non-contrast localization CT was performed of the pelvis to demonstrate the iliac marrow spaces. Maximal barrier  sterile technique utilized including caps, mask, sterile gowns, sterile gloves, large sterile drape, hand hygiene, and Betadine prep. Under sterile conditions and local anesthesia, an 11 gauge coaxial bone biopsy needle was advanced into the right iliac marrow space. Needle position was confirmed with CT imaging. Initially, bone marrow aspiration was performed. Next, the 11 gauge outer cannula was utilized to obtain a right iliac bone marrow core biopsy. Needle was removed. Hemostasis was obtained with compression. The patient tolerated the procedure well. Samples were prepared with the cytotechnologist. No immediate complications. IMPRESSION: CT guided right iliac bone marrow aspiration and core biopsy. Electronically Signed   By: Jerilynn Mages.  Shick M.D.   On: 11/15/2018 11:25   Ir Imaging Guided Port Insertion  Result Date: 11/18/2018 INDICATION: 53 year old female with history lymphoma EXAM: IMPLANTED PORT A CATH PLACEMENT WITH ULTRASOUND AND FLUOROSCOPIC GUIDANCE MEDICATIONS: 2.0 g Ancef; The antibiotic was administered within an appropriate time interval prior to skin puncture. ANESTHESIA/SEDATION:  Moderate (conscious) sedation was employed during this procedure. A total of Versed 2.0 mg and Fentanyl 100 mcg was administered intravenously. Moderate Sedation Time: 18 minutes. The patient's level of consciousness and vital signs were monitored continuously by radiology nursing throughout the procedure under my direct supervision. FLUOROSCOPY TIME:  0 minutes, 12 seconds (1.0 mGy) COMPLICATIONS: None PROCEDURE: The procedure, risks, benefits, and alternatives were explained to the patient. Questions regarding the procedure were encouraged and answered. The patient understands and consents to the procedure. Ultrasound survey was performed with images stored and sent to PACs. The right neck and chest was prepped with chlorhexidine, and draped in the usual sterile fashion using maximum barrier technique (cap and mask, sterile gown, sterile gloves, large sterile sheet, hand hygiene and cutaneous antiseptic). Antibiotic prophylaxis was provided with 2.0g Ancef administered IV one hour prior to skin incision. Local anesthesia was attained by infiltration with 1% lidocaine without epinephrine. Ultrasound demonstrated patency of the right internal jugular vein, and this was documented with an image. Under real-time ultrasound guidance, this vein was accessed with a 21 gauge micropuncture needle and image documentation was performed. A small dermatotomy was made at the access site with an 11 scalpel. A 0.018" wire was advanced into the SVC and used to estimate the length of the internal catheter. The access needle exchanged for a 21F micropuncture vascular sheath. The 0.018" wire was then removed and a 0.035" wire advanced into the IVC. An appropriate location for the subcutaneous reservoir was selected below the clavicle and an incision was made through the skin and underlying soft tissues. The subcutaneous tissues were then dissected using a combination of blunt and sharp surgical technique and a pocket was formed. A  single lumen power injectable portacatheter was then tunneled through the subcutaneous tissues from the pocket to the dermatotomy and the port reservoir placed within the subcutaneous pocket. The venous access site was then serially dilated and a peel away vascular sheath placed over the wire. The wire was removed and the port catheter advanced into position under fluoroscopic guidance. The catheter tip is positioned in the cavoatrial junction. This was documented with a spot image. The portacatheter was then tested and found to flush and aspirate well. The port was flushed with saline followed by 100 units/mL heparinized saline. The pocket was then closed in two layers using first subdermal inverted interrupted absorbable sutures followed by a running subcuticular suture. The epidermis was then sealed with Dermabond. The dermatotomy at the venous access site was also seal with Dermabond. Patient tolerated the  procedure well and remained hemodynamically stable throughout. No complications encountered and no significant blood loss encountered IMPRESSION: Status post right IJ port catheter placement. Catheter ready for use. Signed, Dulcy Fanny. Dellia Nims, RPVI [facility] Radiology Electronically Signed   By: Corrie Mckusick D.O.   On: 11/18/2018 11:23     ASSESSMENT/PLAN:  Stage IV Hodgkin lymphoma -Port placed in 10/2018; TTE showed normal LVEF -The patient received day 1 of cycle 1 ofAVD + Adcedris on 11/19/2018.; received for Granix x 6 days so far, starting2/26/2020. Remains neutropenic. Granix will be continued. -Recommend PRN anti-emetics, including Zofran and Compazine  Normocytic anemia -Likely secondary to anemia of chronic disease and underlying lymphoma -Hemoglobin is 8.2. No transfusion indicated today. -Supportive transfusion to keep Hgb > 7  Thrombocytopenia -Likely secondary to underlying lymphoma and recent chemotherapy. -Platelets  18,000.  She has no bleeding.  No transfusion indicated at this time.  The patient was advised to notify us if she sees any bleeding. -Daily CBC -Goal plts >10k in the absence of bleeding   Persistent fever -Secondary to underlying lymphoma and bacteremia.  -Patient has + blood cultures (E Coli and Group B Strep) -Continue Ceftriaxone and Vancomycin- sensitivities pending -Fungal culture is pending -Recommend monitoring the patient to ensure that she is fever-free for at least 24to 48hrs. without Tylenol before discharge.  Severe protein malnutrition -Patient has lost > 20 lbs since the onset of her symptoms, and her most recent albumin was 1.7 with anasarca, consistent with severe protein malnutrition -Dietitian is following. -If her nutrition remains very poor and she is unable to increase her PO intake, we will have to consider enteral nutrition, such as PEG tube, to help improve her nutritional intake  Diarrhea with abdominal discomfort -Stool for c. Diff has been ordered. Pending collection. -Continue pain meds.  Hypokalemia -Replete per primary team  Hypocalcemia -Corrected calcium ~5.9 (based on albumin from 11/25/2018 - repeat CMET pending) -Calcium gluconate ordered.    Patient's daughter, Anderson Malta, [phone], requests periodic updates.   LOS: 15 days   Mikey Bussing, DNP, AGPCNP-BC, AOCNP 11/26/18

## 2018-11-26 NOTE — Progress Notes (Signed)
NUTRITION NOTE  Spoke with RN who reports patient unable to take PO d/t severity of oral/esophageal pain. Discussed with her changing oral nutrition supplements to PRN orders and RN is in agreement. Changes made at this time.     Jarome Matin, MS, RD, LDN, [facility] Inpatient Clinical Dietitian Pager # [phone] After hours/weekend pager # [phone]

## 2018-11-26 NOTE — Progress Notes (Signed)
Physical Therapy Treatment Patient Details Name: Cindy Ward MRN: [id number] DOB: 10/10/1965 Today's Date: 11/26/2018    History of Present Illness 53 yo female admitted to ED on 11/11/18 for 9 month history of N/V, fatigue, and weight loss. Pt previously followed in [place], oncology reports inconclusive findings. 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 Pt with R iliac BM aspiration and core, awaiting results. Other PMH includes HTN, hypothyroidism.  Course complicated by sepsis and trasnferred to ICU on 11/25/18.    PT Comments    Patient not progressing due to increased severity of illness and downgrade to ICU status since last session.  Currently mod A for OOB to chair with limited activity tolerance.  She continues to be appropriate for SNF level rehab at d/c.   Follow Up Recommendations  Supervision for mobility/OOB;SNF     Equipment Recommendations  None recommended by PT    Recommendations for Other Services       Precautions / Restrictions Precautions Precautions: Fall Restrictions LUE Weight Bearing: (no lifting over 10# 2 weeks following biopsy of extremity)    Mobility  Bed Mobility Overal bed mobility: Needs Assistance Bed Mobility: Rolling;Sidelying to Sit Rolling: Min assist Sidelying to sit: Mod assist       General bed mobility comments: cues for technique, increased time, assist for trunk upright  Transfers Overall transfer level: Needs assistance Equipment used: None Transfers: Sit to/from [facility] Sit to Stand: Mod assist Stand pivot transfers: Mod assist       General transfer comment: OOB to chair only due to fatigue and limited activity tolerance, lifting assist from EOB and small steps to turn and back to chair with trunk support  Ambulation/Gait                 Stairs             Wheelchair Mobility    Modified Rankin (Stroke Patients Only)        Balance Overall balance assessment: Needs assistance;History of Falls Sitting-balance support: No upper extremity supported Sitting balance-Leahy Scale: Fair       Standing balance-Leahy Scale: Poor Standing balance comment: mod support for stand step to chair                            Cognition Arousal/Alertness: Awake/alert Behavior During Therapy: Flat affect Overall Cognitive Status: Impaired/Different from baseline                         Following Commands: Follows one step commands with increased time Safety/Judgement: Decreased awareness of deficits   Problem Solving: Slow processing        Exercises      General Comments        Pertinent Vitals/Pain Pain Assessment: Faces Faces Pain Scale: Hurts little more Pain Location: generalized Pain Descriptors / Indicators: Discomfort Pain Intervention(s): Monitored during session;Repositioned;Limited activity within patient's tolerance    Home Living                      Prior Function            PT Goals (current goals can now be found in the care plan section) Progress towards PT goals: Not progressing toward goals - comment    Frequency    Min 2X/week      PT Plan Current plan  remains appropriate    Co-evaluation              AM-PAC PT "6 Clicks" Mobility   Outcome Measure  Help needed turning from your back to your side while in a flat bed without using bedrails?: A Little Help needed moving from lying on your back to sitting on the side of a flat bed without using bedrails?: A Lot Help needed moving to and from a bed to a chair (including a wheelchair)?: A Lot Help needed standing up from a chair using your arms (e.g., wheelchair or bedside chair)?: A Lot Help needed to walk in hospital room?: Total Help needed climbing 3-5 steps with a railing? : Total 6 Click Score: 11    End of Session   Activity Tolerance: Patient limited by fatigue Patient  left: with call bell/phone within reach;in chair;with chair alarm set Nurse Communication: Mobility status PT Visit Diagnosis: Other abnormalities of gait and mobility (R26.89);Difficulty in walking, not elsewhere classified (R26.2);Muscle weakness (generalized) (M62.81)     Time: 3888-2800 PT Time Calculation (min) (ACUTE ONLY): 38 min  Charges:  $Therapeutic Activity: 23-37 mins                     Cindy Ward, [place] [facility] [phone] 11/26/2018    Cindy Ward 11/26/2018, 3:53 PM

## 2018-11-26 NOTE — Progress Notes (Signed)
   NAME:  Cindy Ward, MRN:  [id number], DOB:  02-10-1966, LOS: 25 ADMISSION DATE:  11/11/2018, CONSULTATION DATE:  11/25/2018  REFERRING MD:  Tana Coast, CHIEF COMPLAINT:  sepsis   Brief History   53 year old lady with a history of nausea vomiting fevers night sweats about 7 months duration She had initially been admitted to the hospital following presentation to [facility] where she had had a CT scan showing extensive adenopathy, transferred to [facility] long hospital for further evaluation and management Diagnosed with Hodgkin's lymphoma diagnosis made via lymph node biopsy 2/18  Hospital course complicated by recurrent fevers felt to be related to malignancy   Significant Hospital Events   Intermittent fevers  Consults:  Hematology PCCM 11/25/2018  Procedures:    Significant Diagnostic Tests:  CT chest abdomen and pelvis-extensive adenopathy, pleural effusions, small abdominal fluid on 11/18/2018  Micro Data:  Blood culture  11/13/2018 no growth   11/21/2018 no growth   11/25/2018- e coli, Gr p B strep Urine culture 11/14/2018-no growth MRSA PCR -11/11/2018  Chest x-ray 11/25/2018-mild blunting on the left Antimicrobials:  Cefepime 3/2>> Vancomycin 3/2>> Fluconazole 3/1>>  Interim history/subjective:  Afebrile Cannot swallow Remains tachypenic  Objective   Blood pressure 138/82, pulse 99, temperature 98.9 F (37.2 C), temperature source Oral, resp. rate (!) 21, height 5\' 8"  (1.727 m), weight 74.8 kg, SpO2 96 %.        Intake/Output Summary (Last 24 hours) at 11/26/2018 1050 Last data filed at 11/26/2018 1015 Gross per 24 hour  Intake 4203.03 ml  Output 250 ml  Net 3953.03 ml   Filed Weights   11/20/18 0532 11/22/18 0514 11/24/18 0627  Weight: 78.5 kg 76.2 kg 74.8 kg    Examination: General: Middle-aged lady, chronically ill looking, frail HENT: Dry oral mucosa Lungs:tachypenic when supine, clear lungs, no acc muscles Cardiovascular: S1-S2 appreciated Abdomen: Bowel  sounds appreciated Extremities: No clubbing, no edema Neuro: Alert and oriented x3, moving all extremities GU: Fair output  Resolved Hospital Problem list     Assessment & Plan:  Febrile neutropenia Sepsis -Source is unclear ? Abdominal, urine cx neg -Ct ceftx -Await fungal cultures, galactomannan, Fungitell -on filgrastim  Hodgkin's lymphoma stage IV -Recent diagnosis -CT scan shows extensive adenopathy -Chemotherapy on 2/25 contributing to her neutropenia -Followed by oncology  Tachypnea -Related to sepsis, fevers, pain  Metabolic acidosis  - resolved, off bicarb gtt  Severe protein calorie malnutrition -Orally as tolerated  Oral thrush -On Diflucan  Hypokalemia and hypocalcemia being repleted   PCCM will follow peripherally  Kara Mead MD. FCCP. [facility] Pager [phone] If no response call [phone]   11/26/2018

## 2018-11-27 ENCOUNTER — Inpatient Hospital Stay (HOSPITAL_COMMUNITY): Payer: Medicaid Other

## 2018-11-27 ENCOUNTER — Encounter (HOSPITAL_COMMUNITY): Payer: Self-pay | Admitting: Hematology

## 2018-11-27 ENCOUNTER — Encounter (HOSPITAL_COMMUNITY): Payer: Self-pay | Admitting: Radiology

## 2018-11-27 DIAGNOSIS — A0472 Enterocolitis due to Clostridium difficile, not specified as recurrent: Secondary | ICD-10-CM

## 2018-11-27 DIAGNOSIS — B951 Streptococcus, group B, as the cause of diseases classified elsewhere: Secondary | ICD-10-CM

## 2018-11-27 DIAGNOSIS — Z95828 Presence of other vascular implants and grafts: Secondary | ICD-10-CM

## 2018-11-27 DIAGNOSIS — C8190 Hodgkin lymphoma, unspecified, unspecified site: Secondary | ICD-10-CM

## 2018-11-27 DIAGNOSIS — B9620 Unspecified Escherichia coli [E. coli] as the cause of diseases classified elsewhere: Secondary | ICD-10-CM

## 2018-11-27 DIAGNOSIS — I10 Essential (primary) hypertension: Secondary | ICD-10-CM

## 2018-11-27 DIAGNOSIS — R7881 Bacteremia: Secondary | ICD-10-CM

## 2018-11-27 DIAGNOSIS — R443 Hallucinations, unspecified: Secondary | ICD-10-CM

## 2018-11-27 LAB — COMPREHENSIVE METABOLIC PANEL
ALT: 58 U/L — AB (ref 0–44)
ALT: 53 U/L — ABNORMAL HIGH (ref 0–44)
AST: 41 U/L (ref 15–41)
AST: 34 U/L (ref 15–41)
Albumin: 2 g/dL — ABNORMAL LOW (ref 3.5–5.0)
Albumin: 1.7 g/dL — ABNORMAL LOW (ref 3.5–5.0)
Alkaline Phosphatase: 122 U/L (ref 38–126)
Alkaline Phosphatase: 108 U/L (ref 38–126)
Anion gap: 11 (ref 5–15)
Anion gap: 12 (ref 5–15)
BUN: 6 mg/dL (ref 6–20)
BUN: 6 mg/dL (ref 6–20)
CO2: 29 mmol/L (ref 22–32)
CO2: 26 mmol/L (ref 22–32)
CREATININE: 0.59 mg/dL (ref 0.44–1.00)
Calcium: 4 mg/dL — CL (ref 8.9–10.3)
Chloride: 107 mmol/L (ref 98–111)
Chloride: 109 mmol/L (ref 98–111)
Creatinine, Ser: 0.55 mg/dL (ref 0.44–1.00)
GFR calc Af Amer: >60 mL/min (ref >60)
GFR calc Af Amer: >60 mL/min (ref >60)
GFR calc non Af Amer: >60 mL/min (ref >60)
GFR calc non Af Amer: >60 mL/min (ref >60)
Glucose, Bld: 186 mg/dL — ABNORMAL HIGH (ref 70–99)
Glucose, Bld: 162 mg/dL — ABNORMAL HIGH (ref 70–99)
Potassium: 2.4 mmol/L — CL (ref 3.5–5.1)
Potassium: 2.8 mmol/L — ABNORMAL LOW (ref 3.5–5.1)
Sodium: 147 mmol/L — ABNORMAL HIGH (ref 135–145)
Sodium: 147 mmol/L — ABNORMAL HIGH (ref 135–145)
Total Bilirubin: 4.2 mg/dL — ABNORMAL HIGH (ref 0.3–1.2)
Total Bilirubin: 4 mg/dL — ABNORMAL HIGH (ref 0.3–1.2)
Total Protein: 5.3 g/dL — ABNORMAL LOW (ref 6.5–8.1)
Total Protein: 4.9 g/dL — ABNORMAL LOW (ref 6.5–8.1)

## 2018-11-27 LAB — MISC LABCORP TEST (SEND OUT): Labcorp test code: 183512

## 2018-11-27 LAB — URINALYSIS, ROUTINE W REFLEX MICROSCOPIC
Bacteria, UA: NONE SEEN
Bilirubin Urine: NEGATIVE
Glucose, UA: 50 mg/dL — AB
Ketones, ur: NEGATIVE mg/dL
Leukocytes,Ua: NEGATIVE
NITRITE: NEGATIVE
Protein, ur: 30 mg/dL — AB
Specific Gravity, Urine: 1.012 (ref 1.005–1.030)
pH: 7 (ref 5.0–8.0)

## 2018-11-27 LAB — CBC
HCT: 25.9 % — ABNORMAL LOW (ref 36.0–46.0)
Hemoglobin: 8.3 g/dL — ABNORMAL LOW (ref 12.0–15.0)
MCH: 27.8 pg (ref 26.0–34.0)
MCHC: 32 g/dL (ref 30.0–36.0)
MCV: 86.6 fL (ref 80.0–100.0)
PLATELETS: 12 10*3/uL — AB (ref 150–400)
RBC: 2.99 MIL/uL — AB (ref 3.87–5.11)
RDW: 17.2 % — ABNORMAL HIGH (ref 11.5–15.5)
WBC: 0.2 10*3/uL — CL (ref 4.0–10.5)
nRBC: 0 % (ref 0.0–0.2)

## 2018-11-27 LAB — C DIFFICILE QUICK SCREEN W PCR REFLEX
C Diff antigen: POSITIVE — AB
C Diff toxin: NEGATIVE

## 2018-11-27 LAB — BILIRUBIN, FRACTIONATED(TOT/DIR/INDIR)
Bilirubin, Direct: 2.3 mg/dL — ABNORMAL HIGH (ref 0.0–0.2)
Indirect Bilirubin: 1.5 mg/dL — ABNORMAL HIGH (ref 0.3–0.9)
Total Bilirubin: 3.8 mg/dL — ABNORMAL HIGH (ref 0.3–1.2)

## 2018-11-27 LAB — FUNGITELL, SERUM: Fungitell Result: <31 pg/mL (ref <80)

## 2018-11-27 LAB — CLOSTRIDIUM DIFFICILE BY PCR, REFLEXED: Toxigenic C. Difficile by PCR: POSITIVE — AB

## 2018-11-27 LAB — MAGNESIUM: MAGNESIUM: 2 mg/dL (ref 1.7–2.4)

## 2018-11-27 LAB — PHOSPHORUS: PHOSPHORUS: 1.8 mg/dL — AB (ref 2.5–4.6)

## 2018-11-27 MED ORDER — METRONIDAZOLE IN NACL 5-0.79 MG/ML-% IV SOLN: 500.0000 mg | Freq: Three times a day (TID) | INTRAVENOUS | Status: DC

## 2018-11-27 MED ORDER — MAGNESIUM SULFATE 2 GM/50ML IV SOLN
2.0000 g | Freq: Once | INTRAVENOUS | Status: AC
  Administered 2018-11-27: 2 g via INTRAVENOUS
  Filled 2018-11-27: qty 50

## 2018-11-27 MED ORDER — SODIUM CHLORIDE (PF) 0.9 % IJ SOLN
INTRAMUSCULAR | Status: AC
  Administered 2018-11-27: 11:00:00
  Filled 2018-11-27: qty 50

## 2018-11-27 MED ORDER — CALCIUM GLUCONATE-NACL 2-0.675 GM/100ML-% IV SOLN
2.0000 g | Freq: Once | INTRAVENOUS | Status: AC
  Administered 2018-11-27: 2000 mg via INTRAVENOUS
  Filled 2018-11-27: qty 100

## 2018-11-27 MED ORDER — POTASSIUM CHLORIDE 10 MEQ/100ML IV SOLN
10.0000 meq | INTRAVENOUS | Status: AC
  Administered 2018-11-27 (×6): 10 meq via INTRAVENOUS
  Filled 2018-11-27 (×6): qty 100

## 2018-11-27 MED ORDER — POTASSIUM PHOSPHATES 15 MMOLE/5ML IV SOLN
40.0000 meq | Freq: Once | INTRAVENOUS | Status: AC
  Administered 2018-11-27: 40 meq via INTRAVENOUS
  Filled 2018-11-27: qty 9.09

## 2018-11-27 MED ORDER — VANCOMYCIN 50 MG/ML ORAL SOLUTION
125.0000 mg | Freq: Four times a day (QID) | ORAL | Status: AC
  Administered 2018-11-27 – 2018-12-10 (×47): 125 mg via ORAL
  Filled 2018-11-27 (×61): qty 2.5

## 2018-11-27 MED ORDER — POTASSIUM CHLORIDE 10 MEQ/100ML IV SOLN
10.0000 meq | INTRAVENOUS | Status: AC
  Administered 2018-11-27 (×5): 10 meq via INTRAVENOUS
  Filled 2018-11-27 (×5): qty 100

## 2018-11-27 MED ORDER — IOHEXOL 300 MG/ML  SOLN
100.0000 mL | Freq: Once | INTRAMUSCULAR | Status: AC | PRN
  Administered 2018-11-27: 100 mL via INTRAVENOUS

## 2018-11-27 MED ORDER — MAGNESIUM SULFATE IN D5W 1-5 GM/100ML-% IV SOLN
1.0000 g | Freq: Once | INTRAVENOUS | Status: AC
  Administered 2018-11-27: 1 g via INTRAVENOUS
  Filled 2018-11-27: qty 100

## 2018-11-27 NOTE — Progress Notes (Addendum)
CRITICAL VALUE ALERT  Critical Value: potassium: 2.8  Date & Time Notied:  1748 11/4318  Provider Notified: Dr. Reesa Chew  Orders Received/Actions taken: potassium x6 IVPB

## 2018-11-27 NOTE — Progress Notes (Signed)
PROGRESS NOTE    Cindy Ward  MWN:[id number] DOB: 16-Jul-1966 DOA: 11/11/2018 PCP: [name], MD   Brief Narrative:  53 year old female with history of essential hypertension, hypothyroidism initially presented to [facility] with complaints of recurrent nausea, vomiting, fever and night sweats.  Upon extensive work-up she was diagnosed with Hodgkin's lymphoma, stage IV.  Oncology team was consulted and patient was transferred here for further care and management.  Lymph node biopsy was consistent with classic Hodgkin's lymphoma.  Hospital course has been complicated by bacteremia, intermittent encephalopathy, severe protein calorie malnutrition and electrolyte imbalance.  She is also been pancytopenic since she is on chemotherapy receiving Granix.  Pulmonary and oncology team has been following.   Assessment & Plan:   Principal Problem:   Hodgkin lymphoma ([place]) Active Problems:   Lymphadenopathy   Fever   Splenomegaly   Liver enzyme elevation   Thrombocytopenia (HCC)   Unintentional weight loss   Hashimoto's thyroiditis   Goiter   Hypertension   Aortic atherosclerosis (HCC)   Pancytopenia (HCC)   Malnutrition of moderate degree   Normocytic anemia   Neutropenic fever (HCC)   Oral thrush  Stage IV Hodgkin's lymphoma, new diagnosis - CT of the chest abdomen pelvis consistent with diffuse lymphadenopathy.  CT of the head is negative for any metastatic disease.  Lymph node biopsy performed 2/18-consistent with Hodgkin's lymphoma.  Port-A-Cath placed 2/24.  Chemotherapy started 2/25 -Management per oncology team. -Consulted palliative care team to help establish some goals of care given multiple ongoing issues and stage IV Hodgkin's lymphoma.  Pancytopenia -Secondary to getting chemotherapy.  Status post 1 unit of platelet.  Continue Granix.  Management per oncology.  Bacteremia with E. coli and group B strep with neutropenia - Continue Rocephin at this time.   Vancomycin discontinued. -Continue to follow blood cultures.  Will repeat surveillance cultures. - Awaiting fungal culture and strep agalactiae data - C. difficile antigen positive, toxin negative.  Currently not having any overt diarrheal symptoms therefore hold off on p.o. vancomycin but will started if it becomes necessary.  Acute metabolic encephalopathy/delirium Visual hallucination -CT of the head does not show any evidence of metastases.  May need MRI of the brain.  Most likely this is metabolic in nature.  We will continue to monitor this.  Hypokalemia/hypophosphatemia/hypocalcemia -Aggressive repletion of electrolytes.  Acute urinary retention with bilateral hydroureteronephrosis - Bladder scan revealed more than 2 L of urine.  Foley catheter placed.  Monitor renal function.  Severe protein calorie malnutrition Mild dysphagia with oral thrush - Encourage p.o. intake.  Nutrition consultation. - Nystatin and Diflucan given.  Hypothyroidism -Continue Synthroid  Generalized debility - PT has been following.  DVT prophylaxis: SCDs Code Status: Full code Family Communication: None at bedside Disposition Plan: Maintain inpatient stay in the stepdown unit.  Patient is at a very high risk of deterioration  Consultants:   Oncology   PCCM  Procedures:   Bx 2/21  Port cath 2/24  Antimicrobials:  Anti-infectives (From admission, onward)   Start     Dose/Rate Route Frequency Ordered Stop   11/27/18 1130  vancomycin (VANCOCIN) 50 mg/mL oral solution 125 mg     125 mg Oral 4 times daily 11/27/18 1051 12/07/18 0959   11/27/18 0915  metroNIDAZOLE (FLAGYL) IVPB 500 mg  Status:  Discontinued     500 mg 100 mL/hr over 60 Minutes Intravenous Every 8 hours 11/27/18 0912 11/27/18 1051   11/26/18 0200  cefTRIAXone (ROCEPHIN) 2 g in sodium chloride  0.9 % 100 mL IVPB     2 g 200 mL/hr over 30 Minutes Intravenous Daily at bedtime 11/26/18 0059     11/25/18 1200  fluconazole  (DIFLUCAN) IVPB 200 mg     200 mg 100 mL/hr over 60 Minutes Intravenous Every 24 hours 11/25/18 0836     11/25/18 1000  vancomycin (VANCOCIN) IVPB 1000 mg/200 mL premix  Status:  Discontinued     1,000 mg 200 mL/hr over 60 Minutes Intravenous Every 12 hours 11/25/18 0848 11/26/18 0827   11/25/18 1000  ceFEPIme (MAXIPIME) 2 g in sodium chloride 0.9 % 100 mL IVPB  Status:  Discontinued     2 g 200 mL/hr over 30 Minutes Intravenous Every 8 hours 11/25/18 0848 11/26/18 0058   11/25/18 0900  vancomycin (VANCOCIN) 1,500 mg in sodium chloride 0.9 % 500 mL IVPB     1,500 mg 250 mL/hr over 120 Minutes Intravenous  Once 11/25/18 0848 11/25/18 1135   11/24/18 1500  fluconazole (DIFLUCAN) tablet 200 mg  Status:  Discontinued     200 mg Oral Daily 11/24/18 1450 11/25/18 0836   11/18/18 0600  ceFAZolin (ANCEF) IVPB 2g/100 mL premix     2 g 200 mL/hr over 30 Minutes Intravenous To Radiology 11/17/18 1243 11/18/18 1130   11/12/18 0800  cefTRIAXone (ROCEPHIN) 2 g in sodium chloride 0.9 % 100 mL IVPB  Status:  Discontinued     2 g 200 mL/hr over 30 Minutes Intravenous Every 24 hours 11/11/18 1711 11/12/18 1057          Subjective: Having off and on visual hallucination but by the time I saw the patient she was not having any.  She denied any complaints.  Appears extremely weak.  Review of Systems Otherwise negative except as per HPI, including: General: Denies fever, chills, night sweats or unintended weight loss. Resp: Denies cough, wheezing, shortness of breath. Cardiac: Denies chest pain, palpitations, orthopnea, paroxysmal nocturnal dyspnea. GI: Denies abdominal pain, nausea, vomiting, diarrhea or constipation GU: Denies dysuria, frequency, hesitancy or incontinence MS: Denies muscle aches, joint pain or swelling Neuro: Denies headache, neurologic deficits (focal weakness, numbness, tingling), abnormal gait Psych: Denies anxiety, depression, SI/HI/AVH Skin: Denies new rashes or lesions ID:  Denies sick contacts, exotic exposures, travel  Objective: Vitals:   11/27/18 0700 11/27/18 0800 11/27/18 0900 11/27/18 1200  BP: 124/73 119/75 125/72   Pulse: (!) 103 (!) 102 (!) 107   Resp: (!) 24 (!) 29 (!) 29   Temp:  99.7 F (37.6 C)  98.1 F (36.7 C)  TempSrc:    Oral  SpO2: 98% 98% 95%   Weight:      Height:        Intake/Output Summary (Last 24 hours) at 11/27/2018 1301 Last data filed at 11/27/2018 1100 Gross per 24 hour  Intake 1172.56 ml  Output 2925 ml  Net -1752.44 ml   Filed Weights   11/20/18 0532 11/22/18 0514 11/24/18 0627  Weight: 78.5 kg 76.2 kg 74.8 kg    Examination:  General exam: Appears calm and comfortable, very ill-appearing with bilateral temporal wasting Respiratory system: Diminished breath sounds at bilateral bases Cardiovascular system: S1 & S2 heard, RRR. No JVD, murmurs, rubs, gallops or clicks. No pedal edema. Gastrointestinal system: Abdomen is nondistended, soft and nontender. No organomegaly or masses felt. Normal bowel sounds heard. Central nervous system: Alert and oriented. No focal neurological deficits. Extremities: Symmetric 3+ x 5 power. Skin: No rashes, lesions or ulcers Psychiatry: Calm mood but overall  poor judgment    Data Reviewed:   CBC: Recent Labs  Lab 11/23/18 0610 11/24/18 0600 11/25/18 0554 11/26/18 0619 11/27/18 0500  WBC 1.1* 0.5* 0.2* 0.2* 0.2*  NEUTROABS 0.9* 0.2*  --   --   --   HGB 9.2* 9.4* 9.1* 8.2* 8.3*  HCT 27.7* 29.6* 29.0* 24.7* 25.9*  MCV 86.3 88.9 89.0 85.2 86.6  PLT 26* 28* 19* 18* 12*   Basic Metabolic Panel: Recent Labs  Lab 11/23/18 1000  11/25/18 0554 11/25/18 1540 11/26/18 0452 11/26/18 0619 11/26/18 1722 11/26/18 1900 11/27/18 0500  NA  --    < > 142 145 147*  --  147*  --  147*  K  --    < > 2.8* 3.3* 2.7*  --  3.3*  --  2.4*  CL  --    < > 109 113* 110  --  110  --  107  CO2  --    < > 14* 18* 25  --  25  --  29  GLUCOSE  --    < > 129* 174* 189*  --  143*  --  186*    BUN  --    < > <5* <5* <5*  --  <5*  --  6  CREATININE  --    < > 0.59 0.48 0.53  --  0.56  --  0.59  CALCIUM  --    < > 6.8* 6.4* 4.4*  --  <4.0*  --  <4.0*  MG 2.0  --  1.9  --   --  1.7 1.6*  --  2.0  PHOS  --   --   --   --   --   --   --  <1.0* 1.8*   < > = values in this interval not displayed.   GFR: Estimated Creatinine Clearance: 82 mL/min (by C-G formula based on SCr of 0.59 mg/dL). Liver Function Tests: Recent Labs  Lab 11/23/18 0610 11/25/18 0920 11/26/18 1722 11/27/18 0500  AST 32  --   --  41  ALT 25  --   --  58*  ALKPHOS 126  --   --  122  BILITOT 1.2  --   --  3.8*  4.2*  PROT 4.9*  --   --  5.3*  ALBUMIN 1.7* 2.1* 2.1* 2.0*   No results for input(s): LIPASE, AMYLASE in the last 168 hours. Recent Labs  Lab 11/25/18 1228  AMMONIA 24   Coagulation Profile: No results for input(s): INR, PROTIME in the last 168 hours. Cardiac Enzymes: No results for input(s): CKTOTAL, CKMB, CKMBINDEX, TROPONINI in the last 168 hours. BNP (last 3 results) No results for input(s): PROBNP in the last 8760 hours. HbA1C: No results for input(s): HGBA1C in the last 72 hours. CBG: Recent Labs  Lab 11/20/18 2153  GLUCAP 92   Lipid Profile: No results for input(s): CHOL, HDL, LDLCALC, TRIG, CHOLHDL, LDLDIRECT in the last 72 hours. Thyroid Function Tests: No results for input(s): TSH, T4TOTAL, FREET4, T3FREE, THYROIDAB in the last 72 hours. Anemia Panel: No results for input(s): VITAMINB12, FOLATE, FERRITIN, TIBC, IRON, RETICCTPCT in the last 72 hours. Sepsis Labs: No results for input(s): PROCALCITON, LATICACIDVEN in the last 168 hours.  Recent Results (from the past 240 hour(s))  Culture, blood (routine x 2)     Status: None   Collection Time: 11/21/18  2:51 PM  Result Value Ref Range Status   Specimen Description   Final  BLOOD LEFT ANTECUBITAL Performed at [facility] [address] [place], [place] [address]    Special Requests   Final     BOTTLES DRAWN AEROBIC AND ANAEROBIC Blood Culture adequate volume Performed at [facility] [address] [place], [place] [address]    Culture   Final    NO GROWTH 5 DAYS Performed at [facility], [place] [address] [place], [place] [address]    Report Status 11/26/2018 FINAL  Final  Culture, blood (routine x 2)     Status: None   Collection Time: 11/21/18  2:59 PM  Result Value Ref Range Status   Specimen Description   Final    BLOOD RIGHT ANTECUBITAL Performed at [facility] [address] [place], [place] [address]    Special Requests   Final    BOTTLES DRAWN AEROBIC ONLY Blood Culture adequate volume Performed at [facility] [address] [place], [place] [address]    Culture   Final    NO GROWTH 5 DAYS Performed at [facility], [place] [address] [place], [place] [address]    Report Status 11/26/2018 FINAL  Final  Culture, blood (routine x 2)     Status: Abnormal (Preliminary result)   Collection Time: 11/25/18  9:08 AM  Result Value Ref Range Status   Specimen Description   Final    BLOOD RIGHT HAND Performed at [facility] [address] [place], [place] [address]    Special Requests   Final    BAA BCAV Performed at [facility] [address] [place], [place] [address]    Culture  Setup Time   Final    GRAM POSITIVE COCCI GRAM NEGATIVE RODS IN BOTH AEROBIC AND ANAEROBIC BOTTLES CRITICAL VALUE NOTED.  VALUE IS CONSISTENT WITH PREVIOUSLY REPORTED AND CALLED VALUE. Performed at [facility], [place] [address] [place], [place] [address]    Culture (A)  Final    ESCHERICHIA COLI GROUP B STREP(S.AGALACTIAE)ISOLATED    Report Status PENDING  Incomplete  Culture, blood (routine x 2)     Status: Abnormal (Preliminary result)   Collection Time: 11/25/18  9:08 AM  Result Value Ref Range Status   Specimen Description BLOOD LEFT ARM  Final   Special  Requests BAA BCAV  Final   Culture  Setup Time   Final    GRAM POSITIVE COCCI GRAM NEGATIVE RODS IN BOTH AEROBIC AND ANAEROBIC BOTTLES CRITICAL RESULT CALLED TO, READ BACK BY AND VERIFIED WITH: E GREEN PHARMD 11/26/18 0025 JDW    Culture (A)  Final    ESCHERICHIA COLI GROUP B STREP(S.AGALACTIAE)ISOLATED SUSCEPTIBILITIES TO FOLLOW    Report Status PENDING  Incomplete   Organism ID, Bacteria ESCHERICHIA COLI  Final      Susceptibility   Escherichia coli - MIC*    AMPICILLIN >=32 RESISTANT Resistant     CEFAZOLIN <=4 SENSITIVE Sensitive     CEFEPIME <=1 SENSITIVE Sensitive     CEFTAZIDIME <=1 SENSITIVE Sensitive     CEFTRIAXONE <=1 SENSITIVE Sensitive     CIPROFLOXACIN >=4 RESISTANT Resistant     GENTAMICIN <=1 SENSITIVE Sensitive     IMIPENEM <=0.25 SENSITIVE Sensitive     TRIMETH/SULFA <=20 SENSITIVE Sensitive     AMPICILLIN/SULBACTAM >=32 RESISTANT Resistant     PIP/TAZO <=4 SENSITIVE Sensitive     Extended ESBL Value in next row Sensitive      NEGATIVEPerformed at [facility], [place] [address], [place], [place] [address]    * ESCHERICHIA COLI  Blood Culture ID Panel (Reflexed)     Status: Abnormal   Collection Time: 11/25/18  9:08 AM  Result Value Ref Range Status   Enterococcus species NOT DETECTED NOT DETECTED Final   Listeria monocytogenes NOT DETECTED NOT DETECTED Final   Staphylococcus species NOT DETECTED NOT DETECTED Final   Staphylococcus aureus (BCID) NOT DETECTED NOT DETECTED Final   Streptococcus species DETECTED (A) NOT DETECTED Final    Comment: CRITICAL RESULT CALLED TO, READ BACK BY AND VERIFIED WITH: E GREEN PHARMD 11/26/18 0025 JDW    Streptococcus agalactiae DETECTED (A) NOT DETECTED Final    Comment: CRITICAL RESULT CALLED TO, READ BACK BY AND VERIFIED WITH: E GREEN PHARMD 11/26/18 0025 JDW    Streptococcus pneumoniae NOT DETECTED NOT DETECTED Final   Streptococcus pyogenes NOT DETECTED NOT DETECTED Final   Acinetobacter baumannii NOT DETECTED NOT  DETECTED Final   Enterobacteriaceae species DETECTED (A) NOT DETECTED Final    Comment: Enterobacteriaceae represent a large family of gram-negative bacteria, not a single organism. CRITICAL RESULT CALLED TO, READ BACK BY AND VERIFIED WITH: E GREEN PHARMD 11/26/18 0025 JDW    Enterobacter cloacae complex NOT DETECTED NOT DETECTED Final   Escherichia coli DETECTED (A) NOT DETECTED Final    Comment: CRITICAL RESULT CALLED TO, READ BACK BY AND VERIFIED WITH: E GREEN PHARMD 11/26/18 0025 JDW    Klebsiella oxytoca NOT DETECTED NOT DETECTED Final   Klebsiella pneumoniae NOT DETECTED NOT DETECTED Final   Proteus species NOT DETECTED NOT DETECTED Final   Serratia marcescens NOT DETECTED NOT DETECTED Final   Carbapenem resistance NOT DETECTED NOT DETECTED Final   Haemophilus influenzae NOT DETECTED NOT DETECTED Final   Neisseria meningitidis NOT DETECTED NOT DETECTED Final   Pseudomonas aeruginosa NOT DETECTED NOT DETECTED Final   Candida albicans NOT DETECTED NOT DETECTED Final   Candida glabrata NOT DETECTED NOT DETECTED Final   Candida krusei NOT DETECTED NOT DETECTED Final   Candida parapsilosis NOT DETECTED NOT DETECTED Final   Candida tropicalis NOT DETECTED NOT DETECTED Final    Comment: Performed at [facility], [place] [address] [place], [place] [address]  C difficile quick scan w PCR reflex     Status: Abnormal   Collection Time: 11/27/18  8:00 AM  Result Value Ref Range Status   C Diff antigen POSITIVE (A) NEGATIVE Final   C Diff toxin NEGATIVE NEGATIVE Final   C Diff interpretation Results are indeterminate. See PCR results.  Final    Comment: Performed at [facility], [place] [address], [place], [place] [address]  C. Diff by PCR, Reflexed     Status: Abnormal   Collection Time: 11/27/18  8:00 AM  Result Value Ref Range Status   Toxigenic C. Difficile by PCR POSITIVE (A) NEGATIVE Final    Comment: Positive for toxigenic C. difficile with little to no toxin  production. Only treat if clinical presentation suggests symptomatic illness. Performed at [facility], [place] [address], [place], [place] [address]          Radiology Studies: Dg Chest 1 View  Result Date: 11/26/2018 CLINICAL DATA:  Tachypnea.  Stage IV Hodgkin's lymphoma. EXAM: CHEST  1 VIEW COMPARISON:  Chest radiograph November 25, 2018 FINDINGS: Cardiac silhouette is mildly enlarged and unchanged. Pulmonary vascular congestion with small LEFT pleural effusion. No pneumothorax. Strandy densities LEFT lung base. Surgical clips LEFT axilla. RIGHT single-lumen  chest Port-A-Cath distal tip projecting mid superior vena cava. Osseous structures are unchanged. IMPRESSION: Stable cardiomegaly and pulmonary vascular congestion. Small LEFT pleural effusion and LEFT lung base atelectasis. Electronically Signed   By: Elon Alas M.D.   On: 11/26/2018 20:32   Ct Abdomen Pelvis W Contrast  Result Date: 11/27/2018 CLINICAL DATA:  Inpatient. Non-Hodgkin lymphoma. Ongoing chemotherapy. Abdominal pain. Rising bilirubin. EXAM: CT ABDOMEN AND PELVIS WITH CONTRAST TECHNIQUE: Multidetector CT imaging of the abdomen and pelvis was performed using the standard protocol following bolus administration of intravenous contrast. CONTRAST:  197mL OMNIPAQUE IOHEXOL 300 MG/ML  SOLN COMPARISON:  11/18/2018 CT chest, abdomen and pelvis. FINDINGS: Lower chest: Small dependent bilateral pleural effusions with passive dependent bibasilar atelectasis, unchanged. Small pericardial effusion appears stable. Hepatobiliary: Normal liver size. No liver mass. Normal gallbladder with no radiopaque cholelithiasis. No biliary ductal dilatation. Pancreas: Normal, with no mass or duct dilation. Spleen: Spleen has decreased and is now normal in size. Craniocaudal splenic length 11.4 cm, previously 13.3 cm. No splenic mass. Adrenals/Urinary Tract: Normal adrenals. Mild bilateral hydroureteronephrosis to the level of the ureterovesical  junction. Symmetric mildly delayed contrast nephrograms bilaterally. New vague small low-attenuation 1.6 cm renal cortical focus in the lateral upper left kidney (series 7/image 12). Otherwise no renal lesions. Markedly distended and otherwise normal bladder. Stomach/Bowel: Normal non-distended stomach. Normal caliber small bowel with no small bowel wall thickening. Normal appendix. Normal large bowel with no diverticulosis, large bowel wall thickening or pericolonic fat stranding. Vascular/Lymphatic: Atherosclerotic nonaneurysmal abdominal aorta. Patent portal, splenic, hepatic and renal veins. Left inguinal lymphadenopathy is decreased, for example previously 1.2 cm, now 0.9 cm (series 2/image 84). Retroperitoneal adenopathy in aortocaval and left para-aortic chains has decreased. Representative 0.8 cm aortocaval node (series 2/image 31), previously 1.2 cm. Representative 0.8 cm left para-aortic node (series 2/image 30), previously 1.2 cm. No pathologically enlarged abdominopelvic nodes. Reproductive: Grossly normal uterus.  No adnexal mass. Other: Trace free fluid in pelvic cul-de-sac. No focal fluid collection. No pneumoperitoneum. Mild anasarca, similar. Musculoskeletal: No aggressive appearing focal osseous lesions. Mild lumbar spondylosis. IMPRESSION: 1. Acute urinary retention with massively distended urinary bladder and mild bilateral hydroureteronephrosis. Recommend urgent bladder catheterization. 2. Evidence of treatment response. Spleen is decreased and now normal in size. Retroperitoneal and left inguinal lymphadenopathy is decreased, with no residual pathologically enlarged abdominopelvic nodes. 3. Small dependent bilateral pleural effusions with passive dependent bibasilar atelectasis, stable. 4. Stable small pericardial effusion. 5. Aortic Atherosclerosis (ICD10-I70.0). These results were called by telephone at the time of interpretation on 11/27/2018 at 11:07 am to [facility], who verbally  acknowledged these results. Per the RN, a bladder catheterization was just completed with 2075 cc of urine drained. Electronically Signed   By: Ilona Sorrel M.D.   On: 11/27/2018 11:31        Scheduled Meds: . Chlorhexidine Gluconate Cloth  6 each Topical Daily  . levothyroxine  37.5 mcg Intravenous Daily  . mouth rinse  15 mL Mouth Rinse BID  . nystatin  5 mL Oral QID  . scopolamine  1 patch Transdermal Q72H  . Tbo-filgastrim (GRANIX) SQ  480 mcg Subcutaneous q1800  . vancomycin  125 mg Oral QID   Continuous Infusions: . sodium chloride Stopped (11/25/18 1936)  . sodium chloride    . cefTRIAXone (ROCEPHIN)  IV Stopped (11/26/18 2212)  . fluconazole (DIFLUCAN) IV Stopped (11/27/18 1209)  . potassium PHOSPHATE IVPB (mEq) 40 mEq (11/27/18 0942)     LOS: 16 days   Time spent=  45 mins    Korby Ratay Arsenio Loader, MD [facility]  If 7PM-7AM, please contact night-coverage [email or web address] 11/27/2018, 1:01 PM

## 2018-11-27 NOTE — Progress Notes (Signed)
K.Schorr paged about k 2.4 and calcium of less than 4. No orders placed yet. Day shift RN made aware.

## 2018-11-27 NOTE — Progress Notes (Addendum)
CRITICAL VALUE ALERT  Critical Value: Calcium: 4.0  Date & Time Notied:  11/27/18 1733  Provider Notified: Dr. Reesa Chew  Orders Received/Actions taken: calcium gluconate 2g/ 100 ml IVPB

## 2018-11-27 NOTE — Consult Note (Signed)
[facility] for Infectious Disease    Date of Admission:  11/11/2018   Total days of antibiotics: 2/1        ceftriaxone/flucon               Reason for Consult: Strep bacteremia, C diff, hodgkin lymphoma    Referring Provider: Reesa Chew   Assessment: Hodgkin Lymphoma  E coli and S agalactiae (4/4) bacteremia C diff thrush Protein Calorie Malnutrition Hypocalcemia, Hypokalemia  Plan: 1. Continue po vanco 2. continue isolation 3. Continue ceftriaxone 4. Repeat BCx 5. Hold on pulling port at this point 6. Re-check TTE 7. Watch WBC #  Thank you so much for this interesting consult,  Principal Problem:   Hodgkin lymphoma ([facility]) Active Problems:   Lymphadenopathy   Fever   Splenomegaly   Liver enzyme elevation   Thrombocytopenia (HCC)   Unintentional weight loss   Hashimoto's thyroiditis   Goiter   Hypertension   Aortic atherosclerosis (HCC)   Pancytopenia (HCC)   Malnutrition of moderate degree   Normocytic anemia   Neutropenic fever (HCC)   Oral thrush   . Chlorhexidine Gluconate Cloth  6 each Topical Daily  . levothyroxine  37.5 mcg Intravenous Daily  . mouth rinse  15 mL Mouth Rinse BID  . nystatin  5 mL Oral QID  . scopolamine  1 patch Transdermal Q72H  . Tbo-filgastrim (GRANIX) SQ  480 mcg Subcutaneous q1800  . vancomycin  125 mg Oral QID    HPI: Cindy Ward is a 53 y.o. female adm 2-17 with 11 months of fever, cough and nausea. She has lost 60#, pancytopenia, and had an extensive w/u. Her LN Bx done on 2-18 was notable for hodgkin lymphoma. She was seen by ID, signed off on 2-24.  She had port placed on 2-24 and started AVD + Adcedris, granix (2-25). By 2-28 she began to become neutropenic. She continued to have fever and was started on vanco/flucon/cefepime (3-2/3-2/3-1). Vanco stopped 3-4 Her BCx (3-2) resulted E coli and S agalactiae (4/4). She was noted to have loose BM and was C diff+ on 3-4. She was started on vanco Afebrile since  3-2.  UA 3-4 negative.   Review of Systems: Review of Systems  Constitutional: Positive for fever and weight loss.  Gastrointestinal: Positive for diarrhea.  Please see HPI. All other systems reviewed and negative. She denies diarrhea today No pain at port site  Past Medical History:  Diagnosis Date  . Hypertension   . Thyroid disease     Social History   Tobacco Use  . Smoking status: Never Smoker  . Smokeless tobacco: Never Used  Substance Use Topics  . Alcohol use: Never    Frequency: Never  . Drug use: Never    History reviewed. No pertinent family history.   Medications:  Scheduled: . Chlorhexidine Gluconate Cloth  6 each Topical Daily  . levothyroxine  37.5 mcg Intravenous Daily  . mouth rinse  15 mL Mouth Rinse BID  . nystatin  5 mL Oral QID  . scopolamine  1 patch Transdermal Q72H  . Tbo-filgastrim (GRANIX) SQ  480 mcg Subcutaneous q1800  . vancomycin  125 mg Oral QID    Abtx:  Anti-infectives (From admission, onward)   Start     Dose/Rate Route Frequency Ordered Stop   11/27/18 1130  vancomycin (VANCOCIN) 50 mg/mL oral solution 125 mg     125 mg Oral 4 times daily 11/27/18 1051 12/07/18 0959  11/27/18 0915  metroNIDAZOLE (FLAGYL) IVPB 500 mg  Status:  Discontinued     500 mg 100 mL/hr over 60 Minutes Intravenous Every 8 hours 11/27/18 0912 11/27/18 1051   11/26/18 0200  cefTRIAXone (ROCEPHIN) 2 g in sodium chloride 0.9 % 100 mL IVPB     2 g 200 mL/hr over 30 Minutes Intravenous Daily at bedtime 11/26/18 0059     11/25/18 1200  fluconazole (DIFLUCAN) IVPB 200 mg     200 mg 100 mL/hr over 60 Minutes Intravenous Every 24 hours 11/25/18 0836     11/25/18 1000  vancomycin (VANCOCIN) IVPB 1000 mg/200 mL premix  Status:  Discontinued     1,000 mg 200 mL/hr over 60 Minutes Intravenous Every 12 hours 11/25/18 0848 11/26/18 0827   11/25/18 1000  ceFEPIme (MAXIPIME) 2 g in sodium chloride 0.9 % 100 mL IVPB  Status:  Discontinued     2 g 200 mL/hr over 30  Minutes Intravenous Every 8 hours 11/25/18 0848 11/26/18 0058   11/25/18 0900  vancomycin (VANCOCIN) 1,500 mg in sodium chloride 0.9 % 500 mL IVPB     1,500 mg 250 mL/hr over 120 Minutes Intravenous  Once 11/25/18 0848 11/25/18 1135   11/24/18 1500  fluconazole (DIFLUCAN) tablet 200 mg  Status:  Discontinued     200 mg Oral Daily 11/24/18 1450 11/25/18 0836   11/18/18 0600  ceFAZolin (ANCEF) IVPB 2g/100 mL premix     2 g 200 mL/hr over 30 Minutes Intravenous To Radiology 11/17/18 1243 11/18/18 1130   11/12/18 0800  cefTRIAXone (ROCEPHIN) 2 g in sodium chloride 0.9 % 100 mL IVPB  Status:  Discontinued     2 g 200 mL/hr over 30 Minutes Intravenous Every 24 hours 11/11/18 1711 11/12/18 1057        OBJECTIVE: Blood pressure 123/70, pulse (!) 114, temperature 98.1 F (36.7 C), temperature source Oral, resp. rate 20, height 5\' 8"  (1.727 m), weight 74.8 kg, SpO2 96 %.  Physical Exam Constitutional:      General: She is not in acute distress.    Appearance: She is ill-appearing.  HENT:     Mouth/Throat:     Pharynx: Oropharyngeal exudate present.  Eyes:     Extraocular Movements: Extraocular movements intact.     Pupils: Pupils are equal, round, and reactive to light.  Neck:     Musculoskeletal: Normal range of motion and neck supple.  Cardiovascular:     Rate and Rhythm: Normal rate and regular rhythm.  Pulmonary:     Effort: Pulmonary effort is normal.     Breath sounds: Rhonchi present.  Chest:    Abdominal:     General: Bowel sounds are normal.     Palpations: Abdomen is soft.  Musculoskeletal:        General: No swelling.  Neurological:     General: No focal deficit present.  Psychiatric:        Thought Content: Thought content is delusional (halucinating this AM).     Lab Results Results for orders placed or performed during the hospital encounter of 11/11/18 (from the past 48 hour(s))  Basic metabolic panel     Status: Abnormal   Collection Time: 11/25/18  3:40  PM  Result Value Ref Range   Sodium 145 135 - 145 mmol/L   Potassium 3.3 (L) 3.5 - 5.1 mmol/L   Chloride 113 (H) 98 - 111 mmol/L   CO2 18 (L) 22 - 32 mmol/L   Glucose, Bld 174 (H) 70 -  99 mg/dL   BUN <5 (L) 6 - 20 mg/dL   Creatinine, Ser 0.48 0.44 - 1.00 mg/dL   Calcium 6.4 (LL) 8.9 - 10.3 mg/dL    Comment: CRITICAL RESULT CALLED TO, READ BACK BY AND VERIFIED WITH: E MATHAI,RN [id number] @ 1623 BY J SCOTTON    GFR calc non Af Amer >60 >60 mL/min   GFR calc Af Amer >60 >60 mL/min   Anion gap 14 5 - 15    Comment: Performed at [facility], [place] [address], [place], [place] [address]  Basic metabolic panel     Status: Abnormal   Collection Time: 11/26/18  4:52 AM  Result Value Ref Range   Sodium 147 (H) 135 - 145 mmol/L   Potassium 2.7 (LL) 3.5 - 5.1 mmol/L    Comment: CRITICAL RESULT CALLED TO, READ BACK BY AND VERIFIED WITH: RN CHAVEZ A. AT 0550 11/26/18 CRUICKSHANK A. REPEATED TO VERIFY DELTA CHECK NOTED    Chloride 110 98 - 111 mmol/L   CO2 25 22 - 32 mmol/L   Glucose, Bld 189 (H) 70 - 99 mg/dL   BUN <5 (L) 6 - 20 mg/dL   Creatinine, Ser 0.53 0.44 - 1.00 mg/dL   Calcium 4.4 (LL) 8.9 - 10.3 mg/dL    Comment: REPEATED TO VERIFY CRITICAL RESULT CALLED TO, READ BACK BY AND VERIFIED WITH: RN CHAVEZ A. AT 0550 11/26/18 CRUICKSHANK A    GFR calc non Af Amer >60 >60 mL/min   GFR calc Af Amer >60 >60 mL/min   Anion gap 12 5 - 15    Comment: Performed at [facility], [place] [address], [place], [place] [address]  Magnesium     Status: None   Collection Time: 11/26/18  6:19 AM  Result Value Ref Range   Magnesium 1.7 1.7 - 2.4 mg/dL    Comment: Performed at [facility], [place] [address], [place], [place] [address]  CBC with Differential/Platelet     Status: Abnormal   Collection Time: 11/26/18  6:19 AM  Result Value Ref Range   WBC 0.2 (LL) 4.0 - 10.5 K/uL    Comment: REPEATED TO VERIFY CRITICAL VALUE NOTED.  VALUE IS CONSISTENT  WITH PREVIOUSLY REPORTED AND CALLED VALUE.    RBC 2.90 (L) 3.87 - 5.11 MIL/uL   Hemoglobin 8.2 (L) 12.0 - 15.0 g/dL   HCT 24.7 (L) 36.0 - 46.0 %   MCV 85.2 80.0 - 100.0 fL   MCH 28.3 26.0 - 34.0 pg   MCHC 33.2 30.0 - 36.0 g/dL   RDW 17.3 (H) 11.5 - 15.5 %   Platelets 18 (LL) 150 - 400 K/uL    Comment: REPEATED TO VERIFY Immature Platelet Fraction may be clinically indicated, consider ordering this additional test [id number] CRITICAL VALUE NOTED.  VALUE IS CONSISTENT WITH PREVIOUSLY REPORTED AND CALLED VALUE.    nRBC 0.0 0.0 - 0.2 %   WBC Morphology TOO FEW TO COUNT, SMEAR AVAILABLE FOR REVIEW     Comment: RARE LYMPHOCYTES PRESENT   Polychromasia PRESENT    Target Cells PRESENT     Comment: Performed at [facility], [place] [address] [place], [place] [address]  Basic metabolic panel     Status: Abnormal   Collection Time: 11/26/18  5:22 PM  Result Value Ref Range   Sodium 147 (H) 135 - 145 mmol/L   Potassium 3.3 (L) 3.5 - 5.1 mmol/L    Comment: DELTA CHECK NOTED REPEATED TO VERIFY NO  VISIBLE HEMOLYSIS    Chloride 110 98 - 111 mmol/L   CO2 25 22 - 32 mmol/L   Glucose, Bld 143 (H) 70 - 99 mg/dL   BUN <5 (L) 6 - 20 mg/dL   Creatinine, Ser 0.56 0.44 - 1.00 mg/dL   Calcium <4.0 (LL) 8.9 - 10.3 mg/dL    Comment: REPEATED TO VERIFY CRITICAL RESULT CALLED TO, READ BACK BY AND VERIFIED WITH: E.NATHAI AT 1822 ON 11/26/18 BY N.THOMPSON    GFR calc non Af Amer >60 >60 mL/min   GFR calc Af Amer >60 >60 mL/min   Anion gap 12 5 - 15    Comment: Performed at [facility], [place] [address] [place], [place] [address]  Magnesium     Status: Abnormal   Collection Time: 11/26/18  5:22 PM  Result Value Ref Range   Magnesium 1.6 (L) 1.7 - 2.4 mg/dL    Comment: Performed at [facility], [place] [address], [place], [place] [address]  Albumin     Status: Abnormal   Collection Time: 11/26/18  5:22 PM  Result Value Ref Range   Albumin 2.1 (L)  3.5 - 5.0 g/dL    Comment: Performed at [facility], [place] [address] [place], [place] [address]  Phosphorus     Status: Abnormal   Collection Time: 11/26/18  7:00 PM  Result Value Ref Range   Phosphorus <1.0 (LL) 2.5 - 4.6 mg/dL    Comment: REPEATED TO VERIFY CRITICAL RESULT CALLED TO, READ BACK BY AND VERIFIED WITH: A.CHAVEZ AT 2019 ON 11/26/18 BY N.THOMPSON Performed at [facility], [place] [address] [place], [place] [address]   Uric acid     Status: Abnormal   Collection Time: 11/26/18  7:00 PM  Result Value Ref Range   Uric Acid, Serum 1.8 (L) 2.5 - 7.1 mg/dL    Comment: Performed at [facility], [place] [address], [place], [place] [address]  Comprehensive metabolic panel     Status: Abnormal   Collection Time: 11/27/18  5:00 AM  Result Value Ref Range   Sodium 147 (H) 135 - 145 mmol/L   Potassium 2.4 (LL) 3.5 - 5.1 mmol/L    Comment: DELTA CHECK NOTED REPEATED TO VERIFY CRITICAL RESULT CALLED TO, READ BACK BY AND VERIFIED WITH: CHAVEZ,R. RN AT 4010 11/27/18 MULLINS,T    Chloride 107 98 - 111 mmol/L   CO2 29 22 - 32 mmol/L   Glucose, Bld 186 (H) 70 - 99 mg/dL   BUN 6 6 - 20 mg/dL   Creatinine, Ser 0.59 0.44 - 1.00 mg/dL   Calcium <4.0 (LL) 8.9 - 10.3 mg/dL    Comment: REPEATED TO VERIFY CRITICAL RESULT CALLED TO, READ BACK BY AND VERIFIED WITH: CHAVEZ,R. RN AT 2725 11/27/18 MULLINS,T    Total Protein 5.3 (L) 6.5 - 8.1 g/dL   Albumin 2.0 (L) 3.5 - 5.0 g/dL   AST 41 15 - 41 U/L   ALT 58 (H) 0 - 44 U/L   Alkaline Phosphatase 122 38 - 126 U/L   Total Bilirubin 4.2 (H) 0.3 - 1.2 mg/dL   GFR calc non Af Amer >60 >60 mL/min   GFR calc Af Amer >60 >60 mL/min   Anion gap 11 5 - 15    Comment: Performed at [facility], [place] [address] [place],  [address]  CBC     Status: Abnormal   Collection Time: 11/27/18  5:00 AM  Result Value  Ref Range   WBC 0.2 (LL) 4.0 - 10.5 K/uL    Comment: CRITICAL  VALUE NOTED.  VALUE IS CONSISTENT WITH PREVIOUSLY REPORTED AND CALLED VALUE.   RBC 2.99 (L) 3.87 - 5.11 MIL/uL   Hemoglobin 8.3 (L) 12.0 - 15.0 g/dL   HCT 25.9 (L) 36.0 - 46.0 %   MCV 86.6 80.0 - 100.0 fL   MCH 27.8 26.0 - 34.0 pg   MCHC 32.0 30.0 - 36.0 g/dL   RDW 17.2 (H) 11.5 - 15.5 %   Platelets 12 (LL) 150 - 400 K/uL    Comment: Immature Platelet Fraction may be clinically indicated, consider ordering this additional test [id number] CRITICAL VALUE NOTED.  VALUE IS CONSISTENT WITH PREVIOUSLY REPORTED AND CALLED VALUE.    nRBC 0.0 0.0 - 0.2 %    Comment: Performed at [facility], [place] [address] [place], [place] [address]  Phosphorus     Status: Abnormal   Collection Time: 11/27/18  5:00 AM  Result Value Ref Range   Phosphorus 1.8 (L) 2.5 - 4.6 mg/dL    Comment: Performed at [facility], [place] [address] [place], [place] [address]  Magnesium     Status: None   Collection Time: 11/27/18  5:00 AM  Result Value Ref Range   Magnesium 2.0 1.7 - 2.4 mg/dL    Comment: Performed at [facility], [place] [address] [place], [place] [address]  Bilirubin, fractionated(tot/dir/indir)     Status: Abnormal   Collection Time: 11/27/18  5:00 AM  Result Value Ref Range   Total Bilirubin 3.8 (H) 0.3 - 1.2 mg/dL   Bilirubin, Direct 2.3 (H) 0.0 - 0.2 mg/dL   Indirect Bilirubin 1.5 (H) 0.3 - 0.9 mg/dL    Comment: Performed at [facility], [place] [address] [place], [place] [address]  C difficile quick scan w PCR reflex     Status: Abnormal   Collection Time: 11/27/18  8:00 AM  Result Value Ref Range   C Diff antigen POSITIVE (A) NEGATIVE   C Diff toxin NEGATIVE NEGATIVE   C Diff interpretation Results are indeterminate. See PCR results.     Comment: Performed at [facility], [place] [address], [place], [place] [address]  C. Diff by PCR, Reflexed     Status: Abnormal   Collection Time: 11/27/18  8:00 AM    Result Value Ref Range   Toxigenic C. Difficile by PCR POSITIVE (A) NEGATIVE    Comment: Positive for toxigenic C. difficile with little to no toxin production. Only treat if clinical presentation suggests symptomatic illness. Performed at [facility], [place] [address] [place], [place] [address]   Urinalysis, Routine w reflex microscopic     Status: Abnormal   Collection Time: 11/27/18 11:22 AM  Result Value Ref Range   Color, Urine YELLOW YELLOW   APPearance CLEAR CLEAR   Specific Gravity, Urine 1.012 1.005 - 1.030   pH 7.0 5.0 - 8.0   Glucose, UA 50 (A) NEGATIVE mg/dL   Hgb urine dipstick MODERATE (A) NEGATIVE   Bilirubin Urine NEGATIVE NEGATIVE   Ketones, ur NEGATIVE NEGATIVE mg/dL   Protein, ur 30 (A) NEGATIVE mg/dL   Nitrite NEGATIVE NEGATIVE   Leukocytes,Ua NEGATIVE NEGATIVE   RBC / HPF 6-10 0 - 5 RBC/hpf   WBC, UA 0-5 0 - 5 WBC/hpf   Bacteria, UA NONE SEEN NONE SEEN   Squamous Epithelial / LPF 0-5 0 - 5   Mucus PRESENT  Comment: Performed at [facility], [place] [address], [place], [place] [address]      Component Value Date/Time   SDES  11/25/2018 0908    BLOOD RIGHT HAND Performed at [facility], [place] [address], [place], [place] [address]    SDES BLOOD LEFT ARM 11/25/2018 0908   SPECREQUEST  11/25/2018 0908    BAA BCAV Performed at [facility], [place] [address] [place], [place] [address]    SPECREQUEST BAA BCAV 11/25/2018 0908   CULT (A) 11/25/2018 0908    ESCHERICHIA COLI GROUP B STREP(S.AGALACTIAE)ISOLATED    CULT (A) 11/25/2018 0908    ESCHERICHIA COLI GROUP B STREP(S.AGALACTIAE)ISOLATED SUSCEPTIBILITIES TO FOLLOW    REPTSTATUS PENDING 11/25/2018 0908   REPTSTATUS PENDING 11/25/2018 0908   Dg Chest 1 View  Result Date: 11/26/2018 CLINICAL DATA:  Tachypnea.  Stage IV Hodgkin's lymphoma. EXAM: CHEST  1 VIEW COMPARISON:  Chest radiograph November 25, 2018 FINDINGS: Cardiac silhouette is mildly  enlarged and unchanged. Pulmonary vascular congestion with small LEFT pleural effusion. No pneumothorax. Strandy densities LEFT lung base. Surgical clips LEFT axilla. RIGHT single-lumen chest Port-A-Cath distal tip projecting mid superior vena cava. Osseous structures are unchanged. IMPRESSION: Stable cardiomegaly and pulmonary vascular congestion. Small LEFT pleural effusion and LEFT lung base atelectasis. Electronically Signed   By: Elon Alas M.D.   On: 11/26/2018 20:32   Ct Abdomen Pelvis W Contrast  Result Date: 11/27/2018 CLINICAL DATA:  Inpatient. Non-Hodgkin lymphoma. Ongoing chemotherapy. Abdominal pain. Rising bilirubin. EXAM: CT ABDOMEN AND PELVIS WITH CONTRAST TECHNIQUE: Multidetector CT imaging of the abdomen and pelvis was performed using the standard protocol following bolus administration of intravenous contrast. CONTRAST:  132mL OMNIPAQUE IOHEXOL 300 MG/ML  SOLN COMPARISON:  11/18/2018 CT chest, abdomen and pelvis. FINDINGS: Lower chest: Small dependent bilateral pleural effusions with passive dependent bibasilar atelectasis, unchanged. Small pericardial effusion appears stable. Hepatobiliary: Normal liver size. No liver mass. Normal gallbladder with no radiopaque cholelithiasis. No biliary ductal dilatation. Pancreas: Normal, with no mass or duct dilation. Spleen: Spleen has decreased and is now normal in size. Craniocaudal splenic length 11.4 cm, previously 13.3 cm. No splenic mass. Adrenals/Urinary Tract: Normal adrenals. Mild bilateral hydroureteronephrosis to the level of the ureterovesical junction. Symmetric mildly delayed contrast nephrograms bilaterally. New vague small low-attenuation 1.6 cm renal cortical focus in the lateral upper left kidney (series 7/image 12). Otherwise no renal lesions. Markedly distended and otherwise normal bladder. Stomach/Bowel: Normal non-distended stomach. Normal caliber small bowel with no small bowel wall thickening. Normal appendix. Normal large  bowel with no diverticulosis, large bowel wall thickening or pericolonic fat stranding. Vascular/Lymphatic: Atherosclerotic nonaneurysmal abdominal aorta. Patent portal, splenic, hepatic and renal veins. Left inguinal lymphadenopathy is decreased, for example previously 1.2 cm, now 0.9 cm (series 2/image 84). Retroperitoneal adenopathy in aortocaval and left para-aortic chains has decreased. Representative 0.8 cm aortocaval node (series 2/image 31), previously 1.2 cm. Representative 0.8 cm left para-aortic node (series 2/image 30), previously 1.2 cm. No pathologically enlarged abdominopelvic nodes. Reproductive: Grossly normal uterus.  No adnexal mass. Other: Trace free fluid in pelvic cul-de-sac. No focal fluid collection. No pneumoperitoneum. Mild anasarca, similar. Musculoskeletal: No aggressive appearing focal osseous lesions. Mild lumbar spondylosis. IMPRESSION: 1. Acute urinary retention with massively distended urinary bladder and mild bilateral hydroureteronephrosis. Recommend urgent bladder catheterization. 2. Evidence of treatment response. Spleen is decreased and now normal in size. Retroperitoneal and left inguinal lymphadenopathy is decreased, with no residual pathologically enlarged abdominopelvic nodes. 3. Small dependent bilateral pleural effusions  with passive dependent bibasilar atelectasis, stable. 4. Stable small pericardial effusion. 5. Aortic Atherosclerosis (ICD10-I70.0). These results were called by telephone at the time of interpretation on 11/27/2018 at 11:07 am to [place], who verbally acknowledged these results. Per the RN, a bladder catheterization was just completed with 2075 cc of urine drained. Electronically Signed   By: Ilona Sorrel M.D.   On: 11/27/2018 11:31   Recent Results (from the past 240 hour(s))  Culture, blood (routine x 2)     Status: None   Collection Time: 11/21/18  2:51 PM  Result Value Ref Range Status   Specimen Description   Final    BLOOD LEFT  ANTECUBITAL Performed at [place] [address], [place], [place] [address]    Special Requests   Final    BOTTLES DRAWN AEROBIC AND ANAEROBIC Blood Culture adequate volume Performed at [place] [address], [place], [place] [address]    Culture   Final    NO GROWTH 5 DAYS Performed at [facility], [place] [address], [place], [place]  [address]    Report Status 11/26/2018 FINAL  Final  Culture, blood (routine x 2)     Status: None   Collection Time: 11/21/18  2:59 PM  Result Value Ref Range Status   Specimen Description   Final    BLOOD RIGHT ANTECUBITAL Performed at [place] [address], [place], [place] [address]    Special Requests   Final    BOTTLES DRAWN AEROBIC ONLY Blood Culture adequate volume Performed at [place] [address], [place], [place] [address]    Culture   Final    NO GROWTH 5 DAYS Performed at [facility], [place] [address] [place], [place] [address]    Report Status 11/26/2018 FINAL  Final  Culture, blood (routine x 2)     Status: Abnormal (Preliminary result)   Collection Time: 11/25/18  9:08 AM  Result Value Ref Range Status   Specimen Description   Final    BLOOD RIGHT HAND Performed at [place] [address] [place], [place] [address]    Special Requests   Final    BAA BCAV Performed at [place] [address] [place], [place] [address]    Culture  Setup Time   Final    GRAM POSITIVE COCCI GRAM NEGATIVE RODS IN BOTH AEROBIC AND ANAEROBIC BOTTLES CRITICAL VALUE NOTED.  VALUE IS CONSISTENT WITH PREVIOUSLY REPORTED AND CALLED VALUE. Performed at [facility], [place] [address], [place],  [address]    Culture (A)  Final    ESCHERICHIA COLI GROUP B STREP(S.AGALACTIAE)ISOLATED    Report Status PENDING  Incomplete  Culture, blood (routine x 2)     Status: Abnormal (Preliminary  result)   Collection Time: 11/25/18  9:08 AM  Result Value Ref Range Status   Specimen Description BLOOD LEFT ARM  Final   Special Requests BAA BCAV  Final   Culture  Setup Time   Final    GRAM POSITIVE COCCI GRAM NEGATIVE RODS IN BOTH AEROBIC AND ANAEROBIC BOTTLES CRITICAL RESULT CALLED TO, READ BACK BY AND VERIFIED WITH: E GREEN PHARMD 11/26/18 0025 JDW    Culture (A)  Final    ESCHERICHIA COLI GROUP B STREP(S.AGALACTIAE)ISOLATED SUSCEPTIBILITIES TO FOLLOW    Report Status PENDING  Incomplete   Organism ID, Bacteria ESCHERICHIA COLI  Final  Susceptibility   Escherichia coli - MIC*    AMPICILLIN >=32 RESISTANT Resistant     CEFAZOLIN <=4 SENSITIVE Sensitive     CEFEPIME <=1 SENSITIVE Sensitive     CEFTAZIDIME <=1 SENSITIVE Sensitive     CEFTRIAXONE <=1 SENSITIVE Sensitive     CIPROFLOXACIN >=4 RESISTANT Resistant     GENTAMICIN <=1 SENSITIVE Sensitive     IMIPENEM <=0.25 SENSITIVE Sensitive     TRIMETH/SULFA <=20 SENSITIVE Sensitive     AMPICILLIN/SULBACTAM >=32 RESISTANT Resistant     PIP/TAZO <=4 SENSITIVE Sensitive     Extended ESBL Value in next row Sensitive      NEGATIVEPerformed at [place] [address] [place], [place] [address]    * ESCHERICHIA COLI  Blood Culture ID Panel (Reflexed)     Status: Abnormal   Collection Time: 11/25/18  9:08 AM  Result Value Ref Range Status   Enterococcus species NOT DETECTED NOT DETECTED Final   Listeria monocytogenes NOT DETECTED NOT DETECTED Final   Staphylococcus species NOT DETECTED NOT DETECTED Final   Staphylococcus aureus (BCID) NOT DETECTED NOT DETECTED Final   Streptococcus species DETECTED (A) NOT DETECTED Final    Comment: CRITICAL RESULT CALLED TO, READ BACK BY AND VERIFIED WITH: E GREEN PHARMD 11/26/18 0025 JDW    Streptococcus agalactiae DETECTED (A) NOT DETECTED Final    Comment: CRITICAL RESULT CALLED TO, READ BACK BY AND VERIFIED WITH: E GREEN PHARMD 11/26/18 0025 JDW    Streptococcus pneumoniae  NOT DETECTED NOT DETECTED Final   Streptococcus pyogenes NOT DETECTED NOT DETECTED Final   Acinetobacter baumannii NOT DETECTED NOT DETECTED Final   Enterobacteriaceae species DETECTED (A) NOT DETECTED Final    Comment: Enterobacteriaceae represent a large family of gram-negative bacteria, not a single organism. CRITICAL RESULT CALLED TO, READ BACK BY AND VERIFIED WITH: E GREEN PHARMD 11/26/18 0025 JDW    Enterobacter cloacae complex NOT DETECTED NOT DETECTED Final   Escherichia coli DETECTED (A) NOT DETECTED Final    Comment: CRITICAL RESULT CALLED TO, READ BACK BY AND VERIFIED WITH: E GREEN PHARMD 11/26/18 0025 JDW    Klebsiella oxytoca NOT DETECTED NOT DETECTED Final   Klebsiella pneumoniae NOT DETECTED NOT DETECTED Final   Proteus species NOT DETECTED NOT DETECTED Final   Serratia marcescens NOT DETECTED NOT DETECTED Final   Carbapenem resistance NOT DETECTED NOT DETECTED Final   Haemophilus influenzae NOT DETECTED NOT DETECTED Final   Neisseria meningitidis NOT DETECTED NOT DETECTED Final   Pseudomonas aeruginosa NOT DETECTED NOT DETECTED Final   Candida albicans NOT DETECTED NOT DETECTED Final   Candida glabrata NOT DETECTED NOT DETECTED Final   Candida krusei NOT DETECTED NOT DETECTED Final   Candida parapsilosis NOT DETECTED NOT DETECTED Final   Candida tropicalis NOT DETECTED NOT DETECTED Final    Comment: Performed at [facility], [place] [address], [place], [place] [address]  C difficile quick scan w PCR reflex     Status: Abnormal   Collection Time: 11/27/18  8:00 AM  Result Value Ref Range Status   C Diff antigen POSITIVE (A) NEGATIVE Final   C Diff toxin NEGATIVE NEGATIVE Final   C Diff interpretation Results are indeterminate. See PCR results.  Final    Comment: Performed at [facility], [place] [address] [place], [place] [address]  C. Diff by PCR, Reflexed     Status: Abnormal   Collection Time: 11/27/18  8:00 AM  Result Value Ref Range Status  Toxigenic C. Difficile by PCR POSITIVE (A) NEGATIVE Final    Comment: Positive for toxigenic C. difficile with little to no toxin production. Only treat if clinical presentation suggests symptomatic illness. Performed at [facility], [place] [address], [place], [place] [address]     Microbiology: Recent Results (from the past 240 hour(s))  Culture, blood (routine x 2)     Status: None   Collection Time: 11/21/18  2:51 PM  Result Value Ref Range Status   Specimen Description   Final    BLOOD LEFT ANTECUBITAL Performed at [facility] [address], [place], [place] [address]    Special Requests   Final    BOTTLES DRAWN AEROBIC AND ANAEROBIC Blood Culture adequate volume Performed at [facility] [address], [place], [place] [address]    Culture   Final    NO GROWTH 5 DAYS Performed at [facility], [place] [address], [place], [place] [address]    Report Status 11/26/2018 FINAL  Final  Culture, blood (routine x 2)     Status: None   Collection Time: 11/21/18  2:59 PM  Result Value Ref Range Status   Specimen Description   Final    BLOOD RIGHT ANTECUBITAL Performed at [place] [address] [place], [place] [address]    Special Requests   Final    BOTTLES DRAWN AEROBIC ONLY Blood Culture adequate volume Performed at [facility] [address], [place], [place] [address]    Culture   Final    NO GROWTH 5 DAYS Performed at [facility], [place] [address] [place], [place] [address]    Report Status 11/26/2018 FINAL  Final  Culture, blood (routine x 2)     Status: Abnormal (Preliminary result)   Collection Time: 11/25/18  9:08 AM  Result Value Ref Range Status   Specimen Description   Final    BLOOD RIGHT HAND Performed at [facility] [address] [place], [place] [address]    Special Requests   Final    BAA BCAV Performed at [facility] [address], [place], [place] [address]    Culture  Setup Time   Final    GRAM POSITIVE COCCI GRAM NEGATIVE RODS IN BOTH AEROBIC AND ANAEROBIC BOTTLES CRITICAL VALUE NOTED.  VALUE IS CONSISTENT WITH PREVIOUSLY REPORTED AND CALLED VALUE. Performed at [facility], [place] [address], [place],  [address]    Culture (A)  Final    ESCHERICHIA COLI GROUP B STREP(S.AGALACTIAE)ISOLATED    Report Status PENDING  Incomplete  Culture, blood (routine x 2)     Status: Abnormal (Preliminary result)   Collection Time: 11/25/18  9:08 AM  Result Value Ref Range Status   Specimen Description BLOOD LEFT ARM  Final   Special Requests BAA BCAV  Final   Culture  Setup Time   Final    GRAM POSITIVE COCCI GRAM NEGATIVE RODS IN BOTH AEROBIC AND ANAEROBIC BOTTLES CRITICAL RESULT CALLED TO, READ BACK BY AND VERIFIED WITH: E GREEN PHARMD 11/26/18 0025 JDW    Culture (A)  Final    ESCHERICHIA COLI GROUP B STREP(S.AGALACTIAE)ISOLATED SUSCEPTIBILITIES TO FOLLOW    Report Status PENDING  Incomplete   Organism ID, Bacteria ESCHERICHIA COLI  Final      Susceptibility   Escherichia coli - MIC*    AMPICILLIN >=32 RESISTANT Resistant     CEFAZOLIN <=4 SENSITIVE Sensitive  CEFEPIME <=1 SENSITIVE Sensitive     CEFTAZIDIME <=1 SENSITIVE Sensitive     CEFTRIAXONE <=1 SENSITIVE Sensitive     CIPROFLOXACIN >=4 RESISTANT Resistant     GENTAMICIN <=1 SENSITIVE Sensitive     IMIPENEM <=0.25 SENSITIVE Sensitive     TRIMETH/SULFA <=20 SENSITIVE Sensitive     AMPICILLIN/SULBACTAM >=32 RESISTANT Resistant     PIP/TAZO <=4 SENSITIVE Sensitive     Extended ESBL Value in next row Sensitive      NEGATIVEPerformed at [facility] [facility] [address], [place], [place] [address]    * ESCHERICHIA COLI  Blood Culture ID Panel (Reflexed)     Status: Abnormal   Collection Time: 11/25/18  9:08 AM  Result Value Ref Range Status   Enterococcus species NOT DETECTED NOT DETECTED Final   Listeria  monocytogenes NOT DETECTED NOT DETECTED Final   Staphylococcus species NOT DETECTED NOT DETECTED Final   Staphylococcus aureus (BCID) NOT DETECTED NOT DETECTED Final   Streptococcus species DETECTED (A) NOT DETECTED Final    Comment: CRITICAL RESULT CALLED TO, READ BACK BY AND VERIFIED WITH: E GREEN PHARMD 11/26/18 0025 JDW    Streptococcus agalactiae DETECTED (A) NOT DETECTED Final    Comment: CRITICAL RESULT CALLED TO, READ BACK BY AND VERIFIED WITH: E GREEN PHARMD 11/26/18 0025 JDW    Streptococcus pneumoniae NOT DETECTED NOT DETECTED Final   Streptococcus pyogenes NOT DETECTED NOT DETECTED Final   Acinetobacter baumannii NOT DETECTED NOT DETECTED Final   Enterobacteriaceae species DETECTED (A) NOT DETECTED Final    Comment: Enterobacteriaceae represent a large family of gram-negative bacteria, not a single organism. CRITICAL RESULT CALLED TO, READ BACK BY AND VERIFIED WITH: E GREEN PHARMD 11/26/18 0025 JDW    Enterobacter cloacae complex NOT DETECTED NOT DETECTED Final   Escherichia coli DETECTED (A) NOT DETECTED Final    Comment: CRITICAL RESULT CALLED TO, READ BACK BY AND VERIFIED WITH: E GREEN PHARMD 11/26/18 0025 JDW    Klebsiella oxytoca NOT DETECTED NOT DETECTED Final   Klebsiella pneumoniae NOT DETECTED NOT DETECTED Final   Proteus species NOT DETECTED NOT DETECTED Final   Serratia marcescens NOT DETECTED NOT DETECTED Final   Carbapenem resistance NOT DETECTED NOT DETECTED Final   Haemophilus influenzae NOT DETECTED NOT DETECTED Final   Neisseria meningitidis NOT DETECTED NOT DETECTED Final   Pseudomonas aeruginosa NOT DETECTED NOT DETECTED Final   Candida albicans NOT DETECTED NOT DETECTED Final   Candida glabrata NOT DETECTED NOT DETECTED Final   Candida krusei NOT DETECTED NOT DETECTED Final   Candida parapsilosis NOT DETECTED NOT DETECTED Final   Candida tropicalis NOT DETECTED NOT DETECTED Final    Comment: Performed at [facility], [place] [address],  [place], [place] [address]  C difficile quick scan w PCR reflex     Status: Abnormal   Collection Time: 11/27/18  8:00 AM  Result Value Ref Range Status   C Diff antigen POSITIVE (A) NEGATIVE Final   C Diff toxin NEGATIVE NEGATIVE Final   C Diff interpretation Results are indeterminate. See PCR results.  Final    Comment: Performed at [facility], [place] [address] [place], [place] [address]  C. Diff by PCR, Reflexed     Status: Abnormal   Collection Time: 11/27/18  8:00 AM  Result Value Ref Range Status   Toxigenic C. Difficile by PCR POSITIVE (A) NEGATIVE Final    Comment: Positive for toxigenic C. difficile with little to no toxin production. Only treat  if clinical presentation suggests symptomatic illness. Performed at [facility], [place] [address] [place], [place] [address]     Radiographs and labs were personally reviewed by me.   Bobby Rumpf, MD [facility] [phone] 11/27/2018, 3:27 PM

## 2018-11-27 NOTE — Progress Notes (Signed)
[facility] INPATIENT PROGRESS NOTE  SUBJECTIVE: Cindy Ward 53 y.o. female with Stage IV Hodgkin lymphoma. Received Day 1 Cycle 1 AVD+ Adcedrison 11/19/2018. TMax 99.9 in the past 24 hours. Blood cultures drawn on 11/25/2018 positive for E Coli and Group B Strep (S. Agalactiae) in 4/4 bottles. On Ceftriaxone. Vancomycin discontinued on 11/26/2018. Remains on fluconazole and Nystatin for thrush.  Having pain and difficulty swallowing.  Denies chest pain and shortness of breath. Has abdominal discomfort and diarrhea. C diff sample obtained this am. Reports nausea. No vomiting. Has LLQ abdominal pain. Denies bleeding. Has muscle cramping last evening. Denies this am.   ALLERGIES:  has No Known Allergies.  MEDICATIONS:  Current Facility-Administered Medications  Medication Dose Route Frequency Provider Last Rate Last Dose  . 0.9 %  sodium chloride infusion   Intravenous PRN Mendel Corning, MD   Stopped at 11/25/18 1936  . 0.9 %  sodium chloride infusion   Intravenous Once Rai, Ripudeep K, MD      . acetaminophen (TYLENOL) tablet 650 mg  650 mg Oral Q6H PRN Rai, Ripudeep K, MD   650 mg at 11/25/18 0811   Or  . acetaminophen (TYLENOL) suppository 650 mg  650 mg Rectal Q6H PRN Rai, Ripudeep K, MD   650 mg at 11/25/18 1509  . calcium gluconate 2 g/ 100 mL sodium chloride IVPB  2 g Intravenous Once Amin, Ankit Chirag, MD 100 mL/hr at 11/27/18 0759 2,000 mg at 11/27/18 0759  . cefTRIAXone (ROCEPHIN) 2 g in sodium chloride 0.9 % 100 mL IVPB  2 g Intravenous QHS Dorrene German, [facility]   Stopped at 11/26/18 2212  . Chlorhexidine Gluconate Cloth 2 % PADS 6 each  6 each Topical Daily Rai, Ripudeep K, MD   6 each at 11/26/18 0735  . feeding supplement (BOOST / RESOURCE BREEZE) liquid 1 Container  1 Container Oral Daily PRN Rai, Ripudeep K, MD      . fluconazole (DIFLUCAN) IVPB 200 mg  200 mg Intravenous Q24H Rai, Vernelle Emerald, MD   Stopped at 11/26/18 1208  . HYDROmorphone (DILAUDID) injection  0.5-1 mg  0.5-1 mg Intravenous Q4H PRN Rai, Ripudeep K, MD   1 mg at 11/26/18 1058  . ibuprofen (ADVIL,MOTRIN) tablet 400 mg  400 mg Oral Q8H PRN Rai, Ripudeep K, MD   400 mg at 11/18/18 2334  . iohexol (OMNIPAQUE) 300 MG/ML solution 15 mL  15 mL Oral Once PRN Rai, Ripudeep K, MD   30 mL at 11/18/18 1740  . levothyroxine (SYNTHROID, LEVOTHROID) injection 37.5 mcg  37.5 mcg Intravenous Daily Rai, Ripudeep K, MD   37.5 mcg at 11/26/18 0935  . lip balm (CARMEX) ointment   Topical PRN Rai, Ripudeep K, MD      . magnesium sulfate IVPB 2 g 50 mL  2 g Intravenous Once Amin, Ankit Chirag, MD      . MEDLINE mouth rinse  15 mL Mouth Rinse BID Rai, Ripudeep K, MD   15 mL at 11/26/18 2131  . nystatin (MYCOSTATIN) 100000 UNIT/ML suspension 500,000 Units  5 mL Oral QID Rai, Ripudeep K, MD   500,000 Units at 11/26/18 2131  . ondansetron (ZOFRAN) tablet 4 mg  4 mg Oral Q6H PRN Rai, Ripudeep K, MD       Or  . ondansetron (ZOFRAN) injection 4 mg  4 mg Intravenous Q6H PRN Rai, Ripudeep K, MD   4 mg at 11/27/18 0743  . oxyCODONE (Oxy IR/ROXICODONE) immediate release tablet 5-10  mg  5-10 mg Oral Q4H PRN Rai, Ripudeep K, MD   5 mg at 11/22/18 0130  . phenol (CHLORASEPTIC) mouth spray 1 spray  1 spray Mouth/Throat PRN Rai, Ripudeep K, MD      . potassium chloride 10 mEq in 100 mL IVPB  10 mEq Intravenous Q1 Hr x 5 Amin, Ankit Chirag, MD      . potassium PHOSPHATE 40 mEq in dextrose 5 % 500 mL infusion  40 mEq Intravenous Once Amin, Ankit Chirag, MD      . protein supplement (UNJURY CHICKEN SOUP) powder 8 oz  8 oz Oral BID PRN Rai, Ripudeep K, MD      . scopolamine (TRANSDERM-SCOP) 1 MG/3DAYS 1.5 mg  1 patch Transdermal Q72H Rai, Ripudeep K, MD   1.5 mg at 11/24/18 1327  . sodium chloride flush (NS) 0.9 % injection 10-40 mL  10-40 mL Intracatheter PRN Rai, Ripudeep K, MD      . Tbo-Filgrastim [facility]) injection 480 mcg  480 mcg Subcutaneous q1800 Tish Men, MD   480 mcg at 11/26/18 1700    REVIEW OF SYSTEMS:   Review  of Systems  Review of Systems  Constitutional: Positive for malaise/fatigue and weight loss. Negative for chills and fever.  HENT: Positive for sore throat.        Mucositis and oral thrush.  Eyes: Negative.   Respiratory: Negative.   Cardiovascular: Positive for leg swelling. Negative for chest pain.  Gastrointestinal: Positive for abdominal pain and diarrhea. Negative for nausea and vomiting.  Genitourinary: Negative.   Musculoskeletal: Negative.   Skin: Negative.   Neurological: Negative.   Endo/Heme/Allergies:       Bruises easily.  Denies bleeding.  Psychiatric/Behavioral: Negative.       PHYSICAL EXAMINATION:  Vital signs in last 24 hours: Temp:  [97.7 F (36.5 C)-99.9 F (37.7 C)] 97.7 F (36.5 C) (03/04 0339) Pulse Rate:  [98-142] 103 (03/04 0700) Resp:  [16-41] 24 (03/04 0700) BP: (97-139)/(52-82) 124/73 (03/04 0700) SpO2:  [93 %-100 %] 98 % (03/04 0700) Weight change:  Last BM Date: 11/25/18  Intake/Output from previous day: 03/03 0701 - 03/04 0700 In: 3145.5 [I.V.:1495.6; IV Piggyback:1649.9] Out: 1000 [Urine:825; Stool:175] General: Alert, awake without distress. Chronically ill appearing.  Head: Normocephalic atraumatic. Mouth: mucus membranes dry with mucositis and oral thrush noted.  Eyes: No scleral icterus.  Pupils are equal and round reactive to light. Resp: clear to auscultation bilaterally without rhonchi or wheezes or dullness to percussion. Cardio: Tachycardic. Regular rhythm, S1, S2 normal, no murmur, click, rub or gallop.  Trace bilateral pedal edema.  GI: soft, mild tenderness with palpation.  bowel sounds normal; no masses,  no organomegaly Musculoskeletal: No joint deformity or effusion. Neurological: No motor, sensory deficits.  Intact deep tendon reflexes. Skin: No rashes or lesions.  Portacath/PICC-without erythema  LABORATORY DATA: Lab Results  Component Value Date   WBC 0.2 (LL) 11/27/2018   HGB 8.3 (L) 11/27/2018   HCT 25.9 (L)  11/27/2018   MCV 86.6 11/27/2018   PLT 12 (LL) 11/27/2018    CMP Latest Ref Rng & Units 11/27/2018 11/26/2018 11/26/2018  Glucose 70 - 99 mg/dL 186(H) 143(H) 189(H)  BUN 6 - 20 mg/dL 6 <5(L) <5(L)  Creatinine 0.44 - 1.00 mg/dL 0.59 0.56 0.53  Sodium 135 - 145 mmol/L 147(H) 147(H) 147(H)  Potassium 3.5 - 5.1 mmol/L 2.4(LL) 3.3(L) 2.7(LL)  Chloride 98 - 111 mmol/L 107 110 110  CO2 22 - 32 mmol/L '29 25 25  ' Calcium  8.9 - 10.3 mg/dL <4.0(LL) <4.0(LL) 4.4(LL)  Total Protein 6.5 - 8.1 g/dL 5.3(L) - -  Total Bilirubin 0.3 - 1.2 mg/dL 4.2(H) - -  Alkaline Phos 38 - 126 U/L 122 - -  AST 15 - 41 U/L 41 - -  ALT 0 - 44 U/L 58(H) - -     RADIOGRAPHIC STUDIES:  Dg Chest 1 View  Result Date: 11/26/2018 CLINICAL DATA:  Tachypnea.  Stage IV Hodgkin's lymphoma. EXAM: CHEST  1 VIEW COMPARISON:  Chest radiograph November 25, 2018 FINDINGS: Cardiac silhouette is mildly enlarged and unchanged. Pulmonary vascular congestion with small LEFT pleural effusion. No pneumothorax. Strandy densities LEFT lung base. Surgical clips LEFT axilla. RIGHT single-lumen chest Port-A-Cath distal tip projecting mid superior vena cava. Osseous structures are unchanged. IMPRESSION: Stable cardiomegaly and pulmonary vascular congestion. Small LEFT pleural effusion and LEFT lung base atelectasis. Electronically Signed   By: Elon Alas M.D.   On: 11/26/2018 20:32   Dg Chest 2 View  Result Date: 11/21/2018 CLINICAL DATA:  History of lymphoma.  Patient weak and flushed. EXAM: CHEST - 2 VIEW COMPARISON:  Chest CT, 11/18/2018.  Chest radiographs, 11/11/2018. FINDINGS: Mild enlargement of the cardiopericardial silhouette. No mediastinal or hilar masses. No convincing adenopathy. Small bilateral pleural effusions. Mild dependent lower lobe atelectasis. Lungs otherwise clear. No pneumothorax. Right anterior chest wall, internal jugular, Port-A-Cath is stable from the prior chest CT. Skeletal structures are unremarkable. IMPRESSION: 1. Findings  are similar to the recent prior chest CT. Enlargement of the cardiopericardial silhouette is consistent with the pericardial effusion noted on CT. There are small bilateral pleural effusions with associated lower lobe atelectasis. No convincing pneumonia and no pulmonary edema. Electronically Signed   By: Lajean Manes M.D.   On: 11/21/2018 15:05   Ct Head Wo Contrast  Result Date: 11/25/2018 CLINICAL DATA:  Hodgkin's lymphoma, fevers, headache EXAM: CT HEAD WITHOUT CONTRAST TECHNIQUE: Contiguous axial images were obtained from the base of the skull through the vertex without intravenous contrast. COMPARISON:  11/25/2018 FINDINGS: Brain: No evidence of acute infarction, hemorrhage, hydrocephalus, extra-axial collection or mass lesion/mass effect. Mild periventricular white matter hypodensity. Vascular: No hyperdense vessel or unexpected calcification. Skull: Normal. Negative for fracture or focal lesion. Sinuses/Orbits: No acute finding. Other: None. IMPRESSION: No acute intracranial pathology. No non-contrast CT findings to explain headache. Mild small-vessel white matter disease. Electronically Signed   By: Eddie Candle M.D.   On: 11/25/2018 12:37   Ct Soft Tissue Neck W Contrast  Result Date: 11/18/2018 CLINICAL DATA:  Initial evaluation for Hodgkin's lymphoma, initial workup. EXAM: CT NECK WITH CONTRAST CT CHEST, ABDOMEN, AND PELVIS WITH CONTRAST TECHNIQUE: Multidetector CT imaging of the chest, abdomen and pelvis was performed following the standard protocol during bolus administration of intravenous contrast. CONTRAST:  177m OMNIPAQUE IOHEXOL 300 MG/ML SOLN, 338mOMNIPAQUE IOHEXOL 300 MG/ML SOLN COMPARISON:  None. FINDINGS: CT NECK FINDINGS: Pharynx and larynx: Oral cavity within normal limits without mass lesion or loculated collection. Patient is largely edentulous. Calcified tonsilliths noted within the right palatine tonsil. Tonsils themselves symmetric and within normal limits bilaterally.  Parapharyngeal fat maintained. Nasopharynx normal. No retropharyngeal collection. Epiglottis normal. Vallecula clear. Remainder of the hypopharynx and supraglottic larynx normal. Glottis within normal limits. Subglottic airway clear. Salivary glands: Parotid and submandibular glands within normal limits. Thyroid: Thyroid diffusely enlarged without discrete nodule or mass. Lymph nodes: Mildly prominent level II lymph nodes measure up to 9 mm in short axis bilaterally. Left level III nodes measure up to 9  mm as well. Increased number of shotty subcentimeter nodes within the neck bilaterally, left slightly worse than right. No other pathologically enlarged lymph nodes identified within the neck. Vascular: Normal intravascular enhancement seen throughout the neck. Right IJ approach central venous catheter in place. Soft tissue swelling with stranding and scattered foci of soft tissue emphysema within the right neck likely related to central line placement. Limited intracranial: Unremarkable Visualized orbits: Partially visualized inferior globes and orbits unremarkable. Mastoids and visualized paranasal sinuses: Mucosal thickening noted within the visualized maxillary and sphenoid sinuses. Visualized paranasal sinuses are otherwise clear. Visualize mastoids and middle ear cavities are clear. Skeleton: No acute osseous abnormality. No discrete lytic or blastic osseous lesions. Mild cervical spondylolysis present at C5-6. Other: None. CT CHEST FINDINGS Cardiovascular: Normal intravascular enhancement seen throughout the intra-abdominal aorta without aneurysm or other acute abnormality. Minimal atheromatous plaque within the aortic arch. Visualized great vessels within normal limits. Heart size normal. Small moderate pericardial effusion, simple fluid density. Mediastinum/Nodes: Enlarged nodes within the upper mediastinum measure up to 15 mm in short axis (series 3, image 11). No appreciable supraclavicular adenopathy. No  other pathologically enlarged mediastinal lymph nodes. Mild soft tissue density within the right hilum without frank adenopathy. Mildly enlarged 11 mm left hilar node (series 3, image 28). Enlarged axillary adenopathy seen bilaterally, with the largest node on the left measuring 2.1 cm in short axis (series 3, image 16). Largest node on the right measures 1.7 cm in short axis (series 3, image 8). Scattered soft tissue stranding with emphysema and fluid density overlying the left axilla likely related to recent lymph node biopsy, partially visualized. Esophagus within normal limits. Lungs/Pleura: Tracheobronchial tree intact and patent. Moderate layering bilateral pleural effusions with associated atelectasis. No other airspace consolidation. No pulmonary edema. No pneumothorax. 4 mm subpleural ground-glass nodule present at the anterior right upper lobe (series 4, image 57), indeterminate. No other worrisome pulmonary nodule or mass. Musculoskeletal: No acute osseous finding. No discrete lytic or blastic osseous lesions. CT ABDOMEN PELVIS FINDINGS Hepatobiliary: Liver demonstrates a normal contrast enhanced appearance. Gallbladder within normal limits. No biliary dilatation. Pancreas: Pancreas within normal limits. Spleen: Spleen enlarged measuring 14.3 cm in craniocaudad dimension. Few subtle hypodensities noted within the spleen, largest of which measures approximately 12 mm (series 3, image 57), indeterminate. Adrenals/Urinary Tract: Adrenal glands are normal. Kidneys equal in size with symmetric enhancement. No nephrolithiasis, hydronephrosis, or focal enhancing renal mass. No hydroureter. Bladder within normal limits. Stomach/Bowel: Stomach within normal limits. No evidence for bowel obstruction. Normal appendix. No acute inflammatory changes seen about the bowels. Vascular/Lymphatic: Normal intravascular enhancement seen throughout the intra-abdominal aorta. Mild aorto bi-iliac atherosclerotic disease. No  aneurysm. Mesenteric vessels patent proximally. Enlarged 15 mm aortocaval lymph node (series 3, image 7). Additional multifocal shotty subcentimeter aortocaval and periaortic lymph nodes noted. No other pathologically enlarged intra-abdominal lymph nodes identified. Mildly prominent 12 mm left inguinal lymph nodes noted. No other adenopathy within the pelvis. Reproductive: Uterus within normal limits.  Ovaries normal. Other: Moderate volume free fluid within the pelvis, measuring simple fluid density. No free intraperitoneal air. Musculoskeletal: Scattered anasarca noted within the external soft tissues. No acute osseous finding. No discrete lytic or blastic osseous lesions. IMPRESSION: CT NECK IMPRESSION 1. Increased number of shoddy subcentimeter lymph nodes throughout the neck bilaterally, left slightly worse than right. No pathologically enlarged lymph nodes identified. 2. Mild diffuse thyromegaly without discrete thyroid nodule or mass. 3. Mild soft tissue stranding with emphysema within the right lateral neck related  to recent right-sided Port-A-Cath placement. CT CHEST IMPRESSION 1. Enlarged bilateral axillary and upper mediastinal adenopathy as above, likely related to provided history of lymphoma. Additional borderline enlarged left hilar node may be related to lymphoma or possibly reactive in nature. Attention at follow-up recommended. 2. Moderate layering bilateral pleural effusions with associated atelectasis. 3. Small to moderate pericardial effusion. 4. 4 mm right upper lobe ground-glass nodule, indeterminate. Attention at follow-up recommended. CT ABDOMEN AND PELVIS IMPRESSION 1. Enlarged 15 mm aortocaval lymph node with mildly enlarged 12 mm left inguinal lymph nodes. Additional increased number of shotty subcentimeter retroperitoneal lymph nodes. Findings most likely related to history of lymphoma. 2. Splenomegaly. 3. Moderate volume free fluid within the pelvis, of uncertain etiology, but could be  physiologic and/or related overall volume status. 4. Mild diffuse anasarca. Electronically Signed   By: Jeannine Boga M.D.   On: 11/18/2018 19:56   Ct Chest W Contrast  Result Date: 11/18/2018 CLINICAL DATA:  Initial evaluation for Hodgkin's lymphoma, initial workup. EXAM: CT NECK WITH CONTRAST CT CHEST, ABDOMEN, AND PELVIS WITH CONTRAST TECHNIQUE: Multidetector CT imaging of the chest, abdomen and pelvis was performed following the standard protocol during bolus administration of intravenous contrast. CONTRAST:  117m OMNIPAQUE IOHEXOL 300 MG/ML SOLN, 363mOMNIPAQUE IOHEXOL 300 MG/ML SOLN COMPARISON:  None. FINDINGS: CT NECK FINDINGS: Pharynx and larynx: Oral cavity within normal limits without mass lesion or loculated collection. Patient is largely edentulous. Calcified tonsilliths noted within the right palatine tonsil. Tonsils themselves symmetric and within normal limits bilaterally. Parapharyngeal fat maintained. Nasopharynx normal. No retropharyngeal collection. Epiglottis normal. Vallecula clear. Remainder of the hypopharynx and supraglottic larynx normal. Glottis within normal limits. Subglottic airway clear. Salivary glands: Parotid and submandibular glands within normal limits. Thyroid: Thyroid diffusely enlarged without discrete nodule or mass. Lymph nodes: Mildly prominent level II lymph nodes measure up to 9 mm in short axis bilaterally. Left level III nodes measure up to 9 mm as well. Increased number of shotty subcentimeter nodes within the neck bilaterally, left slightly worse than right. No other pathologically enlarged lymph nodes identified within the neck. Vascular: Normal intravascular enhancement seen throughout the neck. Right IJ approach central venous catheter in place. Soft tissue swelling with stranding and scattered foci of soft tissue emphysema within the right neck likely related to central line placement. Limited intracranial: Unremarkable Visualized orbits: Partially  visualized inferior globes and orbits unremarkable. Mastoids and visualized paranasal sinuses: Mucosal thickening noted within the visualized maxillary and sphenoid sinuses. Visualized paranasal sinuses are otherwise clear. Visualize mastoids and middle ear cavities are clear. Skeleton: No acute osseous abnormality. No discrete lytic or blastic osseous lesions. Mild cervical spondylolysis present at C5-6. Other: None. CT CHEST FINDINGS Cardiovascular: Normal intravascular enhancement seen throughout the intra-abdominal aorta without aneurysm or other acute abnormality. Minimal atheromatous plaque within the aortic arch. Visualized great vessels within normal limits. Heart size normal. Small moderate pericardial effusion, simple fluid density. Mediastinum/Nodes: Enlarged nodes within the upper mediastinum measure up to 15 mm in short axis (series 3, image 11). No appreciable supraclavicular adenopathy. No other pathologically enlarged mediastinal lymph nodes. Mild soft tissue density within the right hilum without frank adenopathy. Mildly enlarged 11 mm left hilar node (series 3, image 28). Enlarged axillary adenopathy seen bilaterally, with the largest node on the left measuring 2.1 cm in short axis (series 3, image 16). Largest node on the right measures 1.7 cm in short axis (series 3, image 8). Scattered soft tissue stranding with emphysema and fluid density overlying the  left axilla likely related to recent lymph node biopsy, partially visualized. Esophagus within normal limits. Lungs/Pleura: Tracheobronchial tree intact and patent. Moderate layering bilateral pleural effusions with associated atelectasis. No other airspace consolidation. No pulmonary edema. No pneumothorax. 4 mm subpleural ground-glass nodule present at the anterior right upper lobe (series 4, image 57), indeterminate. No other worrisome pulmonary nodule or mass. Musculoskeletal: No acute osseous finding. No discrete lytic or blastic osseous  lesions. CT ABDOMEN PELVIS FINDINGS Hepatobiliary: Liver demonstrates a normal contrast enhanced appearance. Gallbladder within normal limits. No biliary dilatation. Pancreas: Pancreas within normal limits. Spleen: Spleen enlarged measuring 14.3 cm in craniocaudad dimension. Few subtle hypodensities noted within the spleen, largest of which measures approximately 12 mm (series 3, image 57), indeterminate. Adrenals/Urinary Tract: Adrenal glands are normal. Kidneys equal in size with symmetric enhancement. No nephrolithiasis, hydronephrosis, or focal enhancing renal mass. No hydroureter. Bladder within normal limits. Stomach/Bowel: Stomach within normal limits. No evidence for bowel obstruction. Normal appendix. No acute inflammatory changes seen about the bowels. Vascular/Lymphatic: Normal intravascular enhancement seen throughout the intra-abdominal aorta. Mild aorto bi-iliac atherosclerotic disease. No aneurysm. Mesenteric vessels patent proximally. Enlarged 15 mm aortocaval lymph node (series 3, image 7). Additional multifocal shotty subcentimeter aortocaval and periaortic lymph nodes noted. No other pathologically enlarged intra-abdominal lymph nodes identified. Mildly prominent 12 mm left inguinal lymph nodes noted. No other adenopathy within the pelvis. Reproductive: Uterus within normal limits.  Ovaries normal. Other: Moderate volume free fluid within the pelvis, measuring simple fluid density. No free intraperitoneal air. Musculoskeletal: Scattered anasarca noted within the external soft tissues. No acute osseous finding. No discrete lytic or blastic osseous lesions. IMPRESSION: CT NECK IMPRESSION 1. Increased number of shoddy subcentimeter lymph nodes throughout the neck bilaterally, left slightly worse than right. No pathologically enlarged lymph nodes identified. 2. Mild diffuse thyromegaly without discrete thyroid nodule or mass. 3. Mild soft tissue stranding with emphysema within the right lateral neck  related to recent right-sided Port-A-Cath placement. CT CHEST IMPRESSION 1. Enlarged bilateral axillary and upper mediastinal adenopathy as above, likely related to provided history of lymphoma. Additional borderline enlarged left hilar node may be related to lymphoma or possibly reactive in nature. Attention at follow-up recommended. 2. Moderate layering bilateral pleural effusions with associated atelectasis. 3. Small to moderate pericardial effusion. 4. 4 mm right upper lobe ground-glass nodule, indeterminate. Attention at follow-up recommended. CT ABDOMEN AND PELVIS IMPRESSION 1. Enlarged 15 mm aortocaval lymph node with mildly enlarged 12 mm left inguinal lymph nodes. Additional increased number of shotty subcentimeter retroperitoneal lymph nodes. Findings most likely related to history of lymphoma. 2. Splenomegaly. 3. Moderate volume free fluid within the pelvis, of uncertain etiology, but could be physiologic and/or related overall volume status. 4. Mild diffuse anasarca. Electronically Signed   By: Jeannine Boga M.D.   On: 11/18/2018 19:56   Ct Abdomen Pelvis W Contrast  Result Date: 11/18/2018 CLINICAL DATA:  Initial evaluation for Hodgkin's lymphoma, initial workup. EXAM: CT NECK WITH CONTRAST CT CHEST, ABDOMEN, AND PELVIS WITH CONTRAST TECHNIQUE: Multidetector CT imaging of the chest, abdomen and pelvis was performed following the standard protocol during bolus administration of intravenous contrast. CONTRAST:  160m OMNIPAQUE IOHEXOL 300 MG/ML SOLN, 350mOMNIPAQUE IOHEXOL 300 MG/ML SOLN COMPARISON:  None. FINDINGS: CT NECK FINDINGS: Pharynx and larynx: Oral cavity within normal limits without mass lesion or loculated collection. Patient is largely edentulous. Calcified tonsilliths noted within the right palatine tonsil. Tonsils themselves symmetric and within normal limits bilaterally. Parapharyngeal fat maintained. Nasopharynx normal. No retropharyngeal  collection. Epiglottis normal.  Vallecula clear. Remainder of the hypopharynx and supraglottic larynx normal. Glottis within normal limits. Subglottic airway clear. Salivary glands: Parotid and submandibular glands within normal limits. Thyroid: Thyroid diffusely enlarged without discrete nodule or mass. Lymph nodes: Mildly prominent level II lymph nodes measure up to 9 mm in short axis bilaterally. Left level III nodes measure up to 9 mm as well. Increased number of shotty subcentimeter nodes within the neck bilaterally, left slightly worse than right. No other pathologically enlarged lymph nodes identified within the neck. Vascular: Normal intravascular enhancement seen throughout the neck. Right IJ approach central venous catheter in place. Soft tissue swelling with stranding and scattered foci of soft tissue emphysema within the right neck likely related to central line placement. Limited intracranial: Unremarkable Visualized orbits: Partially visualized inferior globes and orbits unremarkable. Mastoids and visualized paranasal sinuses: Mucosal thickening noted within the visualized maxillary and sphenoid sinuses. Visualized paranasal sinuses are otherwise clear. Visualize mastoids and middle ear cavities are clear. Skeleton: No acute osseous abnormality. No discrete lytic or blastic osseous lesions. Mild cervical spondylolysis present at C5-6. Other: None. CT CHEST FINDINGS Cardiovascular: Normal intravascular enhancement seen throughout the intra-abdominal aorta without aneurysm or other acute abnormality. Minimal atheromatous plaque within the aortic arch. Visualized great vessels within normal limits. Heart size normal. Small moderate pericardial effusion, simple fluid density. Mediastinum/Nodes: Enlarged nodes within the upper mediastinum measure up to 15 mm in short axis (series 3, image 11). No appreciable supraclavicular adenopathy. No other pathologically enlarged mediastinal lymph nodes. Mild soft tissue density within the right  hilum without frank adenopathy. Mildly enlarged 11 mm left hilar node (series 3, image 28). Enlarged axillary adenopathy seen bilaterally, with the largest node on the left measuring 2.1 cm in short axis (series 3, image 16). Largest node on the right measures 1.7 cm in short axis (series 3, image 8). Scattered soft tissue stranding with emphysema and fluid density overlying the left axilla likely related to recent lymph node biopsy, partially visualized. Esophagus within normal limits. Lungs/Pleura: Tracheobronchial tree intact and patent. Moderate layering bilateral pleural effusions with associated atelectasis. No other airspace consolidation. No pulmonary edema. No pneumothorax. 4 mm subpleural ground-glass nodule present at the anterior right upper lobe (series 4, image 57), indeterminate. No other worrisome pulmonary nodule or mass. Musculoskeletal: No acute osseous finding. No discrete lytic or blastic osseous lesions. CT ABDOMEN PELVIS FINDINGS Hepatobiliary: Liver demonstrates a normal contrast enhanced appearance. Gallbladder within normal limits. No biliary dilatation. Pancreas: Pancreas within normal limits. Spleen: Spleen enlarged measuring 14.3 cm in craniocaudad dimension. Few subtle hypodensities noted within the spleen, largest of which measures approximately 12 mm (series 3, image 57), indeterminate. Adrenals/Urinary Tract: Adrenal glands are normal. Kidneys equal in size with symmetric enhancement. No nephrolithiasis, hydronephrosis, or focal enhancing renal mass. No hydroureter. Bladder within normal limits. Stomach/Bowel: Stomach within normal limits. No evidence for bowel obstruction. Normal appendix. No acute inflammatory changes seen about the bowels. Vascular/Lymphatic: Normal intravascular enhancement seen throughout the intra-abdominal aorta. Mild aorto bi-iliac atherosclerotic disease. No aneurysm. Mesenteric vessels patent proximally. Enlarged 15 mm aortocaval lymph node (series 3, image  7). Additional multifocal shotty subcentimeter aortocaval and periaortic lymph nodes noted. No other pathologically enlarged intra-abdominal lymph nodes identified. Mildly prominent 12 mm left inguinal lymph nodes noted. No other adenopathy within the pelvis. Reproductive: Uterus within normal limits.  Ovaries normal. Other: Moderate volume free fluid within the pelvis, measuring simple fluid density. No free intraperitoneal air. Musculoskeletal: Scattered anasarca noted within the  external soft tissues. No acute osseous finding. No discrete lytic or blastic osseous lesions. IMPRESSION: CT NECK IMPRESSION 1. Increased number of shoddy subcentimeter lymph nodes throughout the neck bilaterally, left slightly worse than right. No pathologically enlarged lymph nodes identified. 2. Mild diffuse thyromegaly without discrete thyroid nodule or mass. 3. Mild soft tissue stranding with emphysema within the right lateral neck related to recent right-sided Port-A-Cath placement. CT CHEST IMPRESSION 1. Enlarged bilateral axillary and upper mediastinal adenopathy as above, likely related to provided history of lymphoma. Additional borderline enlarged left hilar node may be related to lymphoma or possibly reactive in nature. Attention at follow-up recommended. 2. Moderate layering bilateral pleural effusions with associated atelectasis. 3. Small to moderate pericardial effusion. 4. 4 mm right upper lobe ground-glass nodule, indeterminate. Attention at follow-up recommended. CT ABDOMEN AND PELVIS IMPRESSION 1. Enlarged 15 mm aortocaval lymph node with mildly enlarged 12 mm left inguinal lymph nodes. Additional increased number of shotty subcentimeter retroperitoneal lymph nodes. Findings most likely related to history of lymphoma. 2. Splenomegaly. 3. Moderate volume free fluid within the pelvis, of uncertain etiology, but could be physiologic and/or related overall volume status. 4. Mild diffuse anasarca. Electronically Signed    By: Jeannine Boga M.D.   On: 11/18/2018 19:56   Ct Biopsy  Result Date: 11/15/2018 INDICATION: Pancytopenia, concern for lymphoproliferative process EXAM: CT GUIDED RIGHT ILIAC BONE MARROW ASPIRATION AND CORE BIOPSY Date:  11/15/2018 11/15/2018 10:13 am Radiologist:  M. Daryll Brod, MD Guidance:  CT FLUOROSCOPY TIME:  Fluoroscopy Time: None. MEDICATIONS: 1% lidocaine local ANESTHESIA/SEDATION: 2.0 mg IV Versed; 50 mcg IV Fentanyl Moderate Sedation Time:  10 minutes The patient was continuously monitored during the procedure by the interventional radiology nurse under my direct supervision. CONTRAST:  None. COMPLICATIONS: None PROCEDURE: Informed consent was obtained from the patient following explanation of the procedure, risks, benefits and alternatives. The patient understands, agrees and consents for the procedure. All questions were addressed. A time out was performed. The patient was positioned prone and non-contrast localization CT was performed of the pelvis to demonstrate the iliac marrow spaces. Maximal barrier sterile technique utilized including caps, mask, sterile gowns, sterile gloves, large sterile drape, hand hygiene, and Betadine prep. Under sterile conditions and local anesthesia, an 11 gauge coaxial bone biopsy needle was advanced into the right iliac marrow space. Needle position was confirmed with CT imaging. Initially, bone marrow aspiration was performed. Next, the 11 gauge outer cannula was utilized to obtain a right iliac bone marrow core biopsy. Needle was removed. Hemostasis was obtained with compression. The patient tolerated the procedure well. Samples were prepared with the cytotechnologist. No immediate complications. IMPRESSION: CT guided right iliac bone marrow aspiration and core biopsy. Electronically Signed   By: Jerilynn Mages.  Shick M.D.   On: 11/15/2018 11:25   Dg Chest Port 1 View  Result Date: 11/25/2018 CLINICAL DATA:  Dyspnea. EXAM: PORTABLE CHEST 1 VIEW COMPARISON:   November 21, 2018 FINDINGS: The right Port-A-Cath is stable. Small effusion and left basilar opacity remain. Mild pulmonary venous congestion without overt edema. The cardiomediastinal silhouette is stable with cardiomegaly. IMPRESSION: Mild pulmonary venous congestion. Tiny left effusion with underlying opacity in left base, stable. No other change. Electronically Signed   By: Dorise Bullion III M.D   On: 11/25/2018 13:02   Dg Chest Port 1 View  Result Date: 11/11/2018 CLINICAL DATA:  Fever EXAM: PORTABLE CHEST 1 VIEW COMPARISON:  None. FINDINGS: Mild cardiomegaly. No focal consolidation or effusion. No pneumothorax. IMPRESSION: No active disease.  Electronically Signed   By: Donavan Foil M.D.   On: 11/11/2018 21:59   Ct Bone Marrow Biopsy & Aspiration  Result Date: 11/15/2018 INDICATION: Pancytopenia, concern for lymphoproliferative process EXAM: CT GUIDED RIGHT ILIAC BONE MARROW ASPIRATION AND CORE BIOPSY Date:  11/15/2018 11/15/2018 10:13 am Radiologist:  M. Daryll Brod, MD Guidance:  CT FLUOROSCOPY TIME:  Fluoroscopy Time: None. MEDICATIONS: 1% lidocaine local ANESTHESIA/SEDATION: 2.0 mg IV Versed; 50 mcg IV Fentanyl Moderate Sedation Time:  10 minutes The patient was continuously monitored during the procedure by the interventional radiology nurse under my direct supervision. CONTRAST:  None. COMPLICATIONS: None PROCEDURE: Informed consent was obtained from the patient following explanation of the procedure, risks, benefits and alternatives. The patient understands, agrees and consents for the procedure. All questions were addressed. A time out was performed. The patient was positioned prone and non-contrast localization CT was performed of the pelvis to demonstrate the iliac marrow spaces. Maximal barrier sterile technique utilized including caps, mask, sterile gowns, sterile gloves, large sterile drape, hand hygiene, and Betadine prep. Under sterile conditions and local anesthesia, an 11 gauge  coaxial bone biopsy needle was advanced into the right iliac marrow space. Needle position was confirmed with CT imaging. Initially, bone marrow aspiration was performed. Next, the 11 gauge outer cannula was utilized to obtain a right iliac bone marrow core biopsy. Needle was removed. Hemostasis was obtained with compression. The patient tolerated the procedure well. Samples were prepared with the cytotechnologist. No immediate complications. IMPRESSION: CT guided right iliac bone marrow aspiration and core biopsy. Electronically Signed   By: Jerilynn Mages.  Shick M.D.   On: 11/15/2018 11:25   Ir Imaging Guided Port Insertion  Result Date: 11/18/2018 INDICATION: 53 year old female with history lymphoma EXAM: IMPLANTED PORT A CATH PLACEMENT WITH ULTRASOUND AND FLUOROSCOPIC GUIDANCE MEDICATIONS: 2.0 g Ancef; The antibiotic was administered within an appropriate time interval prior to skin puncture. ANESTHESIA/SEDATION: Moderate (conscious) sedation was employed during this procedure. A total of Versed 2.0 mg and Fentanyl 100 mcg was administered intravenously. Moderate Sedation Time: 18 minutes. The patient's level of consciousness and vital signs were monitored continuously by radiology nursing throughout the procedure under my direct supervision. FLUOROSCOPY TIME:  0 minutes, 12 seconds (1.0 mGy) COMPLICATIONS: None PROCEDURE: The procedure, risks, benefits, and alternatives were explained to the patient. Questions regarding the procedure were encouraged and answered. The patient understands and consents to the procedure. Ultrasound survey was performed with images stored and sent to PACs. The right neck and chest was prepped with chlorhexidine, and draped in the usual sterile fashion using maximum barrier technique (cap and mask, sterile gown, sterile gloves, large sterile sheet, hand hygiene and cutaneous antiseptic). Antibiotic prophylaxis was provided with 2.0g Ancef administered IV one hour prior to skin incision.  Local anesthesia was attained by infiltration with 1% lidocaine without epinephrine. Ultrasound demonstrated patency of the right internal jugular vein, and this was documented with an image. Under real-time ultrasound guidance, this vein was accessed with a 21 gauge micropuncture needle and image documentation was performed. A small dermatotomy was made at the access site with an 11 scalpel. A 0.018" wire was advanced into the SVC and used to estimate the length of the internal catheter. The access needle exchanged for a 61F micropuncture vascular sheath. The 0.018" wire was then removed and a 0.035" wire advanced into the IVC. An appropriate location for the subcutaneous reservoir was selected below the clavicle and an incision was made through the skin and underlying soft tissues. The  subcutaneous tissues were then dissected using a combination of blunt and sharp surgical technique and a pocket was formed. A single lumen power injectable portacatheter was then tunneled through the subcutaneous tissues from the pocket to the dermatotomy and the port reservoir placed within the subcutaneous pocket. The venous access site was then serially dilated and a peel away vascular sheath placed over the wire. The wire was removed and the port catheter advanced into position under fluoroscopic guidance. The catheter tip is positioned in the cavoatrial junction. This was documented with a spot image. The portacatheter was then tested and found to flush and aspirate well. The port was flushed with saline followed by 100 units/mL heparinized saline. The pocket was then closed in two layers using first subdermal inverted interrupted absorbable sutures followed by a running subcuticular suture. The epidermis was then sealed with Dermabond. The dermatotomy at the venous access site was also seal with Dermabond. Patient tolerated the procedure well and remained hemodynamically stable throughout. No complications encountered and no  significant blood loss encountered IMPRESSION: Status post right IJ port catheter placement. Catheter ready for use. Signed, Dulcy Fanny. Dellia Nims, RPVI [facility] Electronically Signed   By: Corrie Mckusick D.O.   On: 11/18/2018 11:23     ASSESSMENT/PLAN:  Stage IV Hodgkin lymphoma -Port placed in 10/2018; TTE showed normal LVEF -The patient received day 1 of cycle 1 ofAVD + Adcedris on 11/19/2018.; received for Granix x 6 days so far, starting2/26/2020. Remains neutropenic. Granix will be continued. -Recommend PRN anti-emetics, including Zofran and Compazine  Normocytic anemia -Likely secondary to anemia of chronic disease and underlying lymphoma -Hemoglobin is 8.3. No transfusion indicated today. -Supportive transfusion to keep Hgb > 7. Blood products to be irradiated.   Thrombocytopenia -Likely secondary to underlying lymphoma and recent chemotherapy. -Platelets 12,000.  She has no bleeding.  No transfusion indicated at this time.  The patient was advised to notify us if she sees any bleeding. -Daily CBC -Goal plts >10k in the absence of bleeding. Blood products to be irradiated.    Persistent fever -Secondary to underlying lymphoma and bacteremia.  -Patient has + blood cultures (E Coli and Group B Strep) -Continue Ceftriaxone - sensitivities pending -Fungal culture is pending -Recommend monitoring the patient to ensure that she is fever-free for at least 24to 48hrs. without Tylenol before discharge. -I will call ID this morning to discuss whether further work up is needed.   Severe protein malnutrition -Patient has lost > 20 lbs since the onset of her symptoms, and her most recent albumin was 2.0 with anasarca, consistent with severe protein malnutrition -Dietitian is following. -If her nutrition remains very poor and she is unable to increase her PO intake, we will have to consider enteral nutrition, such as PEG  tube, to help improve her nutritional intake  Diarrhea with abdominal discomfort -Stool for c. Diff Positive.  -Flagyl ordered. -Has a rising T. Bilirubin. - CT abdomen and Pelvis with IV contrast has been ordered. No oral contrast due to severe mucositis.  -Continue pain meds.  Electrolyte abnormalities -Receiving K+ runs, Calcium gluconate, MgSO4 -Recommend Nephrology consult for assistance with electrolyte management.    Patient's daughter, Anderson Malta, [phone], requests periodic updates. Will call later today.    LOS: 16 days   Mikey Bussing, DNP, AGPCNP-BC, AOCNP 11/27/18

## 2018-11-28 DIAGNOSIS — R7881 Bacteremia: Secondary | ICD-10-CM | POA: Diagnosis present

## 2018-11-28 DIAGNOSIS — B955 Unspecified streptococcus as the cause of diseases classified elsewhere: Secondary | ICD-10-CM | POA: Diagnosis present

## 2018-11-28 DIAGNOSIS — E876 Hypokalemia: Secondary | ICD-10-CM

## 2018-11-28 DIAGNOSIS — Z7189 Other specified counseling: Secondary | ICD-10-CM

## 2018-11-28 DIAGNOSIS — E440 Moderate protein-calorie malnutrition: Secondary | ICD-10-CM

## 2018-11-28 DIAGNOSIS — B370 Candidal stomatitis: Secondary | ICD-10-CM

## 2018-11-28 DIAGNOSIS — A0472 Enterocolitis due to Clostridium difficile, not specified as recurrent: Secondary | ICD-10-CM | POA: Diagnosis present

## 2018-11-28 DIAGNOSIS — Z515 Encounter for palliative care: Secondary | ICD-10-CM

## 2018-11-28 LAB — COMPREHENSIVE METABOLIC PANEL
ALT: 60 U/L — ABNORMAL HIGH (ref 0–44)
ALT: 60 U/L — ABNORMAL HIGH (ref 0–44)
ANION GAP: 9 (ref 5–15)
AST: 41 U/L (ref 15–41)
AST: 36 U/L (ref 15–41)
Albumin: 1.9 g/dL — ABNORMAL LOW (ref 3.5–5.0)
Albumin: 1.9 g/dL — ABNORMAL LOW (ref 3.5–5.0)
Alkaline Phosphatase: 110 U/L (ref 38–126)
Alkaline Phosphatase: 109 U/L (ref 38–126)
Anion gap: 9 (ref 5–15)
BUN: 7 mg/dL (ref 6–20)
BUN: 9 mg/dL (ref 6–20)
CHLORIDE: 112 mmol/L — AB (ref 98–111)
CO2: 25 mmol/L (ref 22–32)
CO2: 24 mmol/L (ref 22–32)
Calcium: 4.5 mg/dL — CL (ref 8.9–10.3)
Calcium: 5.1 mg/dL — CL (ref 8.9–10.3)
Chloride: 110 mmol/L (ref 98–111)
Creatinine, Ser: 0.58 mg/dL (ref 0.44–1.00)
Creatinine, Ser: 0.57 mg/dL (ref 0.44–1.00)
GFR calc Af Amer: >60 mL/min (ref >60)
GFR calc non Af Amer: >60 mL/min (ref >60)
Glucose, Bld: 141 mg/dL — ABNORMAL HIGH (ref 70–99)
Glucose, Bld: 147 mg/dL — ABNORMAL HIGH (ref 70–99)
Potassium: 3.3 mmol/L — ABNORMAL LOW (ref 3.5–5.1)
Potassium: 3.4 mmol/L — ABNORMAL LOW (ref 3.5–5.1)
Sodium: 144 mmol/L (ref 135–145)
Sodium: 145 mmol/L (ref 135–145)
TOTAL PROTEIN: 5 g/dL — AB (ref 6.5–8.1)
Total Bilirubin: 4.3 mg/dL — ABNORMAL HIGH (ref 0.3–1.2)
Total Bilirubin: 5.1 mg/dL — ABNORMAL HIGH (ref 0.3–1.2)
Total Protein: 5.1 g/dL — ABNORMAL LOW (ref 6.5–8.1)

## 2018-11-28 LAB — CBC
HCT: 24.3 % — ABNORMAL LOW (ref 36.0–46.0)
Hemoglobin: 7.9 g/dL — ABNORMAL LOW (ref 12.0–15.0)
MCH: 28.4 pg (ref 26.0–34.0)
MCHC: 32.5 g/dL (ref 30.0–36.0)
MCV: 87.4 fL (ref 80.0–100.0)
Platelets: 11 10*3/uL — CL (ref 150–400)
RBC: 2.78 MIL/uL — ABNORMAL LOW (ref 3.87–5.11)
RDW: 17.7 % — AB (ref 11.5–15.5)
WBC: 0.3 10*3/uL — AB (ref 4.0–10.5)
nRBC: 0 % (ref 0.0–0.2)

## 2018-11-28 LAB — CULTURE, BLOOD (ROUTINE X 2)

## 2018-11-28 LAB — PHOSPHORUS: Phosphorus: 2 mg/dL — ABNORMAL LOW (ref 2.5–4.6)

## 2018-11-28 LAB — MAGNESIUM: Magnesium: 2.5 mg/dL — ABNORMAL HIGH (ref 1.7–2.4)

## 2018-11-28 MED ORDER — SODIUM CHLORIDE 0.9% IV SOLUTION
Freq: Once | INTRAVENOUS | Status: AC
  Administered 2018-11-28: 11:00:00 via INTRAVENOUS

## 2018-11-28 MED ORDER — DIPHENHYDRAMINE HCL 25 MG PO CAPS
25.0000 mg | ORAL_CAPSULE | Freq: Once | ORAL | Status: AC
  Administered 2018-11-28: 25 mg via ORAL
  Filled 2018-11-28: qty 1

## 2018-11-28 MED ORDER — ACETAMINOPHEN 325 MG PO TABS
650.0000 mg | ORAL_TABLET | Freq: Once | ORAL | Status: AC
  Administered 2018-11-28: 650 mg via ORAL
  Filled 2018-11-28: qty 2

## 2018-11-28 MED ORDER — ADULT MULTIVITAMIN W/MINERALS CH
1.0000 | ORAL_TABLET | Freq: Every day | ORAL | Status: DC
  Administered 2018-11-28 – 2018-12-12 (×10): 1 via ORAL
  Filled 2018-11-28 (×14): qty 1

## 2018-11-28 MED ORDER — CALCIUM GLUCONATE-NACL 2-0.675 GM/100ML-% IV SOLN
2.0000 g | Freq: Once | INTRAVENOUS | Status: AC
  Administered 2018-11-28: 2000 mg via INTRAVENOUS
  Filled 2018-11-28: qty 100

## 2018-11-28 MED ORDER — POTASSIUM CHLORIDE 10 MEQ/100ML IV SOLN
10.0000 meq | INTRAVENOUS | Status: AC
  Administered 2018-11-28 (×4): 10 meq via INTRAVENOUS
  Filled 2018-11-28 (×4): qty 100

## 2018-11-28 NOTE — Consult Note (Signed)
Consultation Note Date: 11/28/2018   Patient Name: Cindy Ward  DOB: Aug 24, 1966  MRN: [id number]  Age / Sex: 53 y.o., female  PCP: [name], MD Referring Physician: Damita Lack, MD  Reason for Consultation: Establishing goals of care  HPI/Patient Profile: 53 y.o. female  with past medical history of   admitted on 11/11/2018 with .   Clinical Assessment and Goals of Care: 53 year old female with history of essential hypertension, hypothyroidism initially presented to [facility] with complaints of recurrent nausea, vomiting, fever and night sweats.  Upon extensive work-up she was diagnosed with Hodgkin's lymphoma, stage IV.  Oncology team was consulted and patient was transferred to [facility] further care and management, she was started on appropriate treatment.   Lymph node biopsy was consistent with classic Hodgkin's lymphoma. She remains admitted to hospital medicine service as her hospital course has been complicated by bacteremia,C diff colitis, intermittent encephalopathy, severe protein calorie malnutrition and electrolyte imbalance.  She is also been pancytopenic since she is on chemotherapy receiving Granix.     A palliative consult has been requested for additional goals of care discussions.  The patient is awake alert sitting up in bed, trying to feed herself breakfast, she is on liquids.  I introduced myself and palliative care as follows: Palliative medicine is specialized medical care for people living with serious illness. It focuses on providing relief from the symptoms and stress of a serious illness. The goal is to improve quality of life for both the patient and the family.  The patient states that she is still in quite a shock regarding her diagnosis of Lymphoma. She is encouraged by the fact that recent imaging has shown some improvement.   We discussed  about her current pain and non pain regimen.   She hasn't designated a Psychiatrist yet, discussed with her about advanced directives paperwork. She would like for all of her children to participate in decision making, if she is ever not able to make her own health care related decisions.   She looks forward to increasing her PO intake and being more out of bed. See below.   HCPOA  2 daughters and a son.   SUMMARY OF RECOMMENDATIONS   Full code, full scope.  Patient states that she is shocked by her sudden diagnosis of lymphoma. She states that she is encouraged by her recent imaging showing decreased size of spleen and shrinking of lymph nodes.  Continue current scope of care.  Will need SNF rehab with palliative, once acute medical issues subside.  Code Status/Advance Care Planning:  Full code    Symptom Management:   Continue current mode of care.   Palliative Prophylaxis:   Delirium Protocol  Additional Recommendations (Limitations, Scope, Preferences):  Full Scope Treatment  Psycho-social/Spiritual:   Desire for further Chaplaincy support:yes  Additional Recommendations: Education on Hospice  Prognosis:   Unable to determine  Discharge Planning: [facility] for rehab with Palliative care service follow-up      Primary Diagnoses:  Present on Admission: . Lymphadenopathy . Splenomegaly . Liver enzyme elevation . Unintentional weight loss . Hashimoto's thyroiditis . Goiter . Hypertension . Aortic atherosclerosis ([place]) . Pancytopenia ([place]) . Malnutrition of moderate degree . Hodgkin lymphoma ([place])   I have reviewed the medical record, interviewed the patient and family, and examined the patient. The following aspects are pertinent.  Past Medical History:  Diagnosis Date  . Hypertension   . Thyroid disease    Social History   Socioeconomic History  . Marital status: Divorced    Spouse name: Not on file  . Number of children: Not on  file  . Years of education: Not on file  . Highest education level: Not on file  Occupational History  . Not on file  Social Needs  . Financial resource strain: Not on file  . Food insecurity:    Worry: Not on file    Inability: Not on file  . Transportation needs:    Medical: Not on file    Non-medical: Not on file  Tobacco Use  . Smoking status: Never Smoker  . Smokeless tobacco: Never Used  Substance and Sexual Activity  . Alcohol use: Never    Frequency: Never  . Drug use: Never  . Sexual activity: Not on file  Lifestyle  . Physical activity:    Days per week: 0 days    Minutes per session: 0 min  . Stress: To some extent  Relationships  . Social connections:    Talks on phone: Patient refused    Gets together: Patient refused    Attends religious service: Patient refused    Active member of club or organization: Patient refused    Attends meetings of clubs or organizations: Patient refused    Relationship status: Patient refused  Other Topics Concern  . Not on file  Social History Narrative  . Not on file   History reviewed. No pertinent family history. Scheduled Meds: . Chlorhexidine Gluconate Cloth  6 each Topical Daily  . levothyroxine  37.5 mcg Intravenous Daily  . mouth rinse  15 mL Mouth Rinse BID  . multivitamin with minerals  1 tablet Oral Daily  . nystatin  5 mL Oral QID  . scopolamine  1 patch Transdermal Q72H  . Tbo-filgastrim (GRANIX) SQ  480 mcg Subcutaneous q1800  . vancomycin  125 mg Oral QID   Continuous Infusions: . sodium chloride Stopped (11/25/18 1936)  . sodium chloride    . cefTRIAXone (ROCEPHIN)  IV Stopped (11/27/18 2251)  . fluconazole (DIFLUCAN) IV 200 mg (11/28/18 1235)   PRN Meds:.sodium chloride, acetaminophen **OR** acetaminophen, feeding supplement, HYDROmorphone (DILAUDID) injection, ibuprofen, iohexol, lip balm, ondansetron **OR** ondansetron (ZOFRAN) IV, oxyCODONE, phenol, protein supplement, sodium chloride  flush Medications Prior to Admission:  Prior to Admission medications   Medication Sig Start Date End Date Taking? Authorizing Provider  levothyroxine (SYNTHROID, LEVOTHROID) 75 MCG tablet Take 1 tablet by mouth daily. 11/23/17  Yes [provider]  Multiple Vitamin (MULTIVITAMIN WITH MINERALS) TABS tablet Take 1 tablet by mouth daily.   Yes [provider]  nebivolol (BYSTOLIC) 10 MG tablet Take 1 tablet by mouth daily. 10/13/16  Yes [provider]  ondansetron (ZOFRAN-ODT) 8 MG disintegrating tablet Take 1 tablet by mouth as needed for nausea/vomiting. PRN TID 02/27/18  Yes [provider]  PROAIR HFA 108 (90 Base) MCG/ACT inhaler Take 2 puffs by mouth 4 (four) times daily as needed. 02/27/18  Yes [provider]  dexamethasone (DECADRON) 4 MG tablet  Take 2 tablets by mouth once a day on the day after chemotherapy and then take 2 tablets two times a day for 2 days. Take with food. 11/18/18   Tish Men, MD  lidocaine-prilocaine (EMLA) cream Apply to affected area once 11/18/18   Tish Men, MD  LORazepam (ATIVAN) 0.5 MG tablet Take 1 tablet (0.5 mg total) by mouth every 6 (six) hours as needed (Nausea or vomiting). 11/18/18   Tish Men, MD  ondansetron (ZOFRAN) 8 MG tablet Take 1 tablet (8 mg total) by mouth 2 (two) times daily as needed. Start on the third day after chemotherapy. 11/18/18   Tish Men, MD  prochlorperazine (COMPAZINE) 10 MG tablet Take 1 tablet (10 mg total) by mouth every 6 (six) hours as needed (Nausea or vomiting). 11/18/18   Tish Men, MD   No Known Allergies Review of Systems +diarrhea  Physical Exam Awake alert resting in bed Attempting to take in some liquids Lungs clear Abdomen is non tender No edema She appears weak Non focal  Vital Signs: BP 122/65   Pulse (!) 118   Temp 98.3 F (36.8 C) (Oral)   Resp (!) 25   Ht 5\' 8"  (1.727 m)   Wt 66 kg   LMP  (LMP Unknown) Comment: >10 years no period  SpO2 97%   BMI 22.12 kg/m   Pain Scale: 0-10 POSS *See Group Information*: S-Acceptable,Sleep, easy to arouse Pain Score: 0-No pain   SpO2: SpO2: 97 % O2 Device:SpO2: 97 % O2 Flow Rate: .O2 Flow Rate (L/min): 2 L/min  IO: Intake/output summary:   Intake/Output Summary (Last 24 hours) at 11/28/2018 1441 Last data filed at 11/28/2018 1235 Gross per 24 hour  Intake 2154.9 ml  Output 1700 ml  Net 454.9 ml    LBM: Last BM Date: 11/27/18 Baseline Weight: Weight: 66.5 kg Most recent weight: Weight: 66 kg     Palliative Assessment/Data:   Flowsheet Rows     Most Recent Value  Intake Tab  Referral Department  Hospitalist  Unit at Time of Referral  ICU  Palliative Care Primary Diagnosis  Cancer  Date Notified  11/27/18  Palliative Care Type  New Palliative care  Reason for referral  Clarify Goals of Care  Date of Admission  11/11/18  # of days IP prior to Palliative referral  16  Clinical Assessment  Psychosocial & Spiritual Assessment  Palliative Care Outcomes     PPS 40%  Time In: 1300 on 11/28/2018 Time Out: 1400 on 11/28/2018 Time Total: 60 min  Greater than 50%  of this time was spent counseling and coordinating care related to the above assessment and plan.  Signed by: Loistine Chance, MD [id number]  Please contact Palliative Medicine Team phone at [phone] for questions and concerns.  For individual provider: See Shea Evans

## 2018-11-28 NOTE — Progress Notes (Signed)
Nutrition Follow-up  DOCUMENTATION CODES:   Non-severe (moderate) malnutrition in context of chronic illness  INTERVENTION:  - Continue Magic Cup BID.  - Continue Boost Breeze once/day PRN and Unjury BID PRN. - Continue to encourage PO intakes.  - Recommend diet advancement.   NUTRITION DIAGNOSIS:   Moderate Malnutrition related to chronic illness, nausea, vomiting, other (see comment)(night sweats) as evidenced by mild fat depletion, mild muscle depletion, moderate muscle depletion. -ongoing  GOAL:   Patient will meet greater than or equal to 90% of their needs -unmet  MONITOR:   PO intake, Supplement acceptance, Diet advancement, Weight trends, Labs, Skin   ASSESSMENT:   53 y.o. female with medical history significant of HTN and hypothyroidism. She presented at [facility] with recurrent N/V, fevers, and night sweats of 9 months duration. She notes that the symptoms occur about every 2 weeks. She has nightly fevers and night sweats.  The symptoms occur for days at a time, but have been taking longer and longer to resolve. She presented to the ED after getting dizzy and passing out at the [facility]. She was noted to have a fever of 102.7. Multiple enlarged lymph nodes noted on CT abdomen and pelvis.  Current weight is now back down to admission weight; will continue to monitor closely. Patient reports that her throat has not been as sore/painful today as earlier in the week. She drank water and Coke prior to RD visit and patient and RN report that patient's daughter is bringing patient a frosty.   Bottles of Pediasure are in the room. Patient states that she likes these mixed with ice cream. Informed patient that this can be done for her.  Per Oncology NP note from this AM: "if her nutrition remains very poor and she is unable to increase her PO intake, we will have to consider enteral nutrition, such as PEG tube." RD very much so in agreement with this statement/plan.    Palliative Care has been consulted but has not been able to see patient yet. Plan is for patient to receive a unit of platelets today d/t ongoing mild rectal bleeding which is thought to be 2/2 chemo. Ongoing thrush with nystatin and diflucan ordered     Medications reviewed; 2 g IV Ca gluconate x1 run 3/4 and x1 run 3/5, 37.5 mg IV synthroid/day, 1 g IV Mg sulfate x1 run 3/4, 5 ml mycostatin, 10 mEq IV KCl x11 runs 3/4 and x4 runs 3/5, 40 mEq IV KPhos x1 run 3/4, 200 mg IV diflucan/day. Labs reviewed; K: 3.3 mmol/l, Ca: 4.5 mg/dl, Phos: 2 mg/dl, Mg: 2.5 mg/dl.    Diet Order:   Diet Order            Diet clear liquid Room service appropriate? Yes; Fluid consistency: Thin  Diet effective now              EDUCATION NEEDS:   Education needs have been addressed  Skin:  Skin Assessment: Skin Integrity Issues: Skin Integrity Issues:: Incisions Incisions: L axilla (2/18)  Last BM:  3/4  Height:   Ht Readings from Last 1 Encounters:  11/11/18 5\' 8"  (1.727 m)    Weight:   Wt Readings from Last 1 Encounters:  11/28/18 66 kg    Ideal Body Weight:  63.64 kg  BMI:  Body mass index is 22.12 kg/m.  Estimated Nutritional Needs:   Kcal:  2100-2300 kcal  Protein:  100-115 grams  Fluid:  >/= 2.1 L/day  Jarome Matin, MS, RD, LDN, [facility] In[facility] Clinical Dietitian Pager # [phone] After hours/weekend pager # [phone]

## 2018-11-28 NOTE — Progress Notes (Signed)
PROGRESS NOTE    Cindy Ward  FBP:[id number] DOB: 1966-02-08 DOA: 11/11/2018 PCP: [name], MD   Brief Narrative:  53 year old female with history of essential hypertension, hypothyroidism initially presented to [facility] with complaints of recurrent nausea, vomiting, fever and night sweats.  Upon extensive work-up she was diagnosed with Hodgkin's lymphoma, stage IV.  Oncology team was consulted and patient was transferred here for further care and management.  Lymph node biopsy was consistent with classic Hodgkin's lymphoma.  Hospital course has been complicated by bacteremia, intermittent encephalopathy, severe protein calorie malnutrition and electrolyte imbalance.  She is also been pancytopenic since she is on chemotherapy receiving Granix.  Pulmonary and oncology team has been following.   Assessment & Plan:   Principal Problem:   Hodgkin lymphoma ([place]) Active Problems:   Lymphadenopathy   Fever   Splenomegaly   Liver enzyme elevation   Thrombocytopenia (HCC)   Unintentional weight loss   Hashimoto's thyroiditis   Goiter   Hypertension   Aortic atherosclerosis (HCC)   Pancytopenia (HCC)   Malnutrition of moderate degree   Normocytic anemia   Neutropenic fever (HCC)   Oral thrush  Stage IV Hodgkin's lymphoma, new diagnosis - CT of the chest abdomen pelvis consistent with diffuse lymphadenopathy.  CT of the head is negative for any metastatic disease.  Lymph node biopsy performed 2/18-consistent with Hodgkin's lymphoma.  Port-A-Cath placed 2/24.  Chemotherapy started 2/25 -Management per oncology team. -Consulted palliative care team to help establish some goals of care given multiple ongoing issues and stage IV Hodgkin's lymphoma.  Pancytopenia with mild rectal bleeding overnight.  -Secondary to getting chemotherapy.  Status post 1 unit of platelet.  Continue Granix.  Management per oncology. Plans to get 1 more unit of Plt today.   Bacteremia with E. coli  and group B strep with neutropenia - Cont Rocephine. Vanc stopped. Likely source is urine.  -Continue to follow blood cultures.  Repeat cx sent on 11/28/18. - Awaiting fungal culture and strep agalactiae data - C. difficile antigen positive, toxin negative.  On Vanc - per ID?Marland Kitchen Not sure if this is causing patients any overt symptoms at this time.   Elevated Total Bilirubin; possible due to medications.  -CT of the abdomen pelvis is negative for any abnormal pathology in the right upper quadrant.  We will continue to trend total bilirubin for now.  If necessary we will get right upper quadrant ultrasound.  Acute metabolic encephalopathy/delirium; improved.  Visual hallucination; resolved.  -CT of the head does not show any evidence of metastases.  May need MRI of the brain.  Most likely this is metabolic in nature.  We will continue to monitor this.  Hypokalemia/hypophosphatemia/hypocalcemia -Aggressive repletion of electrolytes.  Acute urinary retention with bilateral hydroureteronephrosis -Now foley in place, plan to remove it tomorrow and do a voiding trial. Foley not ideal in neutropenic patients but due to retention causing bacteremia, would like to ensure it remains stable and hydronephrosis has improved before removing it.   Severe protein calorie malnutrition Mild dysphagia with oral thrush - Encourage p.o. intake.  Nutrition consultation. - Nystatin and Diflucan given.  Hypothyroidism -Continue Synthroid  Generalized debility - PT has been following.  DVT prophylaxis: SCDs Code Status: Full code Family Communication: None at bedside Disposition Plan: Maintain close monitoring in stepdown unit.  Consultants:   Oncology   PCCM  Procedures:   Bx 2/21  Port cath 2/24  Antimicrobials:  Anti-infectives (From admission, onward)   Start  Dose/Rate Route Frequency Ordered Stop   11/27/18 1130  vancomycin (VANCOCIN) 50 mg/mL oral solution 125 mg     125 mg Oral 4  times daily 11/27/18 1051 12/07/18 0959   11/27/18 0915  metroNIDAZOLE (FLAGYL) IVPB 500 mg  Status:  Discontinued     500 mg 100 mL/hr over 60 Minutes Intravenous Every 8 hours 11/27/18 0912 11/27/18 1051   11/26/18 0200  cefTRIAXone (ROCEPHIN) 2 g in sodium chloride 0.9 % 100 mL IVPB     2 g 200 mL/hr over 30 Minutes Intravenous Daily at bedtime 11/26/18 0059     11/25/18 1200  fluconazole (DIFLUCAN) IVPB 200 mg     200 mg 100 mL/hr over 60 Minutes Intravenous Every 24 hours 11/25/18 0836     11/25/18 1000  vancomycin (VANCOCIN) IVPB 1000 mg/200 mL premix  Status:  Discontinued     1,000 mg 200 mL/hr over 60 Minutes Intravenous Every 12 hours 11/25/18 0848 11/26/18 0827   11/25/18 1000  ceFEPIme (MAXIPIME) 2 g in sodium chloride 0.9 % 100 mL IVPB  Status:  Discontinued     2 g 200 mL/hr over 30 Minutes Intravenous Every 8 hours 11/25/18 0848 11/26/18 0058   11/25/18 0900  vancomycin (VANCOCIN) 1,500 mg in sodium chloride 0.9 % 500 mL IVPB     1,500 mg 250 mL/hr over 120 Minutes Intravenous  Once 11/25/18 0848 11/25/18 1135   11/24/18 1500  fluconazole (DIFLUCAN) tablet 200 mg  Status:  Discontinued     200 mg Oral Daily 11/24/18 1450 11/25/18 0836   11/18/18 0600  ceFAZolin (ANCEF) IVPB 2g/100 mL premix     2 g 200 mL/hr over 30 Minutes Intravenous To Radiology 11/17/18 1243 11/18/18 1130   11/12/18 0800  cefTRIAXone (ROCEPHIN) 2 g in sodium chloride 0.9 % 100 mL IVPB  Status:  Discontinued     2 g 200 mL/hr over 30 Minutes Intravenous Every 24 hours 11/11/18 1711 11/12/18 1057         Subjective: Feels much better today and she is able to carry on a basic conversation.  Admits of having little bit of rectal bleeding overnight and one episode of formed diarrhea.  Otherwise remains afebrile.  Denies any abdominal pain today  Review of Systems Otherwise negative except as per HPI, including: General = no fevers, chills, dizziness, malaise, fatigue HEENT/EYES = negative for  pain, redness, loss of vision, double vision, blurred vision, loss of hearing, sore throat, hoarseness, dysphagia Cardiovascular= negative for chest pain, palpitation, murmurs, lower extremity swelling Respiratory/lungs= negative for shortness of breath, cough, hemoptysis, wheezing, mucus production Gastrointestinal= negative for nausea, vomiting,, abdominal pain, melena, hematemesis Genitourinary= negative for Dysuria, Hematuria, Change in Urinary Frequency MSK = Negative for arthralgia, myalgias, Back Pain, Joint swelling  Neurology= Negative for headache, seizures, numbness, tingling  Psychiatry= Negative for anxiety, depression, suicidal and homocidal ideation Allergy/Immunology= Medication/Food allergy as listed  Skin= Negative for Rash, lesions, ulcers, itching   Objective: Vitals:   11/28/18 0800 11/28/18 0838 11/28/18 0900 11/28/18 1000  BP: 113/67  124/61 121/74  Pulse: (!) 109  (!) 118 (!) 114  Resp: (!) 26  (!) 29 (!) 25  Temp:  98.4 F (36.9 C)    TempSrc:  Oral    SpO2: 98%  97% 99%  Weight:      Height:        Intake/Output Summary (Last 24 hours) at 11/28/2018 1039 Last data filed at 11/28/2018 0955 Gross per 24 hour  Intake 1726.9  ml  Output 3325 ml  Net -1598.1 ml   Filed Weights   11/22/18 0514 11/24/18 0627 11/28/18 0430  Weight: 76.2 kg 74.8 kg 66 kg    Examination:  Constitutional: NAD, calm, comfortable Eyes: PERRL, lids and conjunctivae normal ENMT: Mucous membranes are Dry Posterior pharynx clear of any exudate or lesions.Normal dentition.  Neck: normal, supple, no masses, no thyromegaly Respiratory: Diminished breath sounds at the bases Cardiovascular: Slight sinus tachycardia, regular rate and rhythm, no murmurs / rubs / gallops. No extremity edema. 2+ pedal pulses. No carotid bruits.  Abdomen: no tenderness, no masses palpated. No hepatosplenomegaly. Bowel sounds positive.  Musculoskeletal: no clubbing / cyanosis. No joint deformity upper and  lower extremities. Good ROM, no contractures. Normal muscle tone.  Skin: no rashes, lesions, ulcers. No induration Neurologic: CN 2-12 grossly intact. Sensation intact, DTR normal. Strength 4/5 in all 4.  Psychiatric: Normal judgment and insight. Alert and oriented x 3. Normal mood.  Port in place without any signs of infection.  Foley in place.    Data Reviewed:   CBC: Recent Labs  Lab 11/23/18 0610 11/24/18 0600 11/25/18 0554 11/26/18 0619 11/27/18 0500 11/28/18 0440  WBC 1.1* 0.5* 0.2* 0.2* 0.2* 0.3*  NEUTROABS 0.9* 0.2*  --   --   --   --   HGB 9.2* 9.4* 9.1* 8.2* 8.3* 7.9*  HCT 27.7* 29.6* 29.0* 24.7* 25.9* 24.3*  MCV 86.3 88.9 89.0 85.2 86.6 87.4  PLT 26* 28* 19* 18* 12* 11*   Basic Metabolic Panel: Recent Labs  Lab 11/25/18 0554  11/26/18 0452 11/26/18 0619 11/26/18 1722 11/26/18 1900 11/27/18 0500 11/27/18 1600 11/28/18 0440  NA 142   < > 147*  --  147*  --  147* 147* 144  K 2.8*   < > 2.7*  --  3.3*  --  2.4* 2.8* 3.3*  CL 109   < > 110  --  110  --  107 109 110  CO2 14*   < > 25  --  25  --  29 26 25   GLUCOSE 129*   < > 189*  --  143*  --  186* 162* 141*  BUN <5*   < > <5*  --  <5*  --  6 6 7   CREATININE 0.59   < > 0.53  --  0.56  --  0.59 0.55 0.58  CALCIUM 6.8*   < > 4.4*  --  <4.0*  --  <4.0* 4.0* 4.5*  MG 1.9  --   --  1.7 1.6*  --  2.0  --  2.5*  PHOS  --   --   --   --   --  <1.0* 1.8*  --  2.0*   < > = values in this interval not displayed.   GFR: Estimated Creatinine Clearance: 82 mL/min (by C-G formula based on SCr of 0.58 mg/dL). Liver Function Tests: Recent Labs  Lab 11/23/18 0610 11/25/18 0920 11/26/18 1722 11/27/18 0500 11/27/18 1600 11/28/18 0440  AST 32  --   --  41 34 41  ALT 25  --   --  58* 53* 60*  ALKPHOS 126  --   --  122 108 110  BILITOT 1.2  --   --  3.8*  4.2* 4.0* 4.3*  PROT 4.9*  --   --  5.3* 4.9* 5.0*  ALBUMIN 1.7* 2.1* 2.1* 2.0* 1.7* 1.9*   No results for input(s): LIPASE, AMYLASE in the last 168 hours. Recent  Labs  Lab 11/25/18 1228  AMMONIA 24   Coagulation Profile: No results for input(s): INR, PROTIME in the last 168 hours. Cardiac Enzymes: No results for input(s): CKTOTAL, CKMB, CKMBINDEX, TROPONINI in the last 168 hours. BNP (last 3 results) No results for input(s): PROBNP in the last 8760 hours. HbA1C: No results for input(s): HGBA1C in the last 72 hours. CBG: No results for input(s): GLUCAP in the last 168 hours. Lipid Profile: No results for input(s): CHOL, HDL, LDLCALC, TRIG, CHOLHDL, LDLDIRECT in the last 72 hours. Thyroid Function Tests: No results for input(s): TSH, T4TOTAL, FREET4, T3FREE, THYROIDAB in the last 72 hours. Anemia Panel: No results for input(s): VITAMINB12, FOLATE, FERRITIN, TIBC, IRON, RETICCTPCT in the last 72 hours. Sepsis Labs: No results for input(s): PROCALCITON, LATICACIDVEN in the last 168 hours.  Recent Results (from the past 240 hour(s))  Culture, blood (routine x 2)     Status: None   Collection Time: 11/21/18  2:51 PM  Result Value Ref Range Status   Specimen Description   Final    BLOOD LEFT ANTECUBITAL Performed at [facility] [address] [place], [place] [address]    Special Requests   Final    BOTTLES DRAWN AEROBIC AND ANAEROBIC Blood Culture adequate volume Performed at [facility] [address], [place], [place] [address]    Culture   Final    NO GROWTH 5 DAYS Performed at [facility], [place] [address] [place], [place] [address]    Report Status 11/26/2018 FINAL  Final  Culture, blood (routine x 2)     Status: None   Collection Time: 11/21/18  2:59 PM  Result Value Ref Range Status   Specimen Description   Final    BLOOD RIGHT ANTECUBITAL Performed at [facility] [address] [place], [place] [address]    Special Requests   Final    BOTTLES DRAWN AEROBIC ONLY Blood Culture adequate volume Performed at [facility] [address] [place], [place] [address]    Culture   Final    NO GROWTH 5 DAYS Performed at [facility], [place] [address] [place], [place] [address]    Report Status 11/26/2018 FINAL  Final  Culture, blood (routine x 2)     Status: Abnormal   Collection Time: 11/25/18  9:08 AM  Result Value Ref Range Status   Specimen Description   Final    BLOOD RIGHT HAND Performed at [facility] [address], [place], [place] [address]    Special Requests   Final    BAA BCAV Performed at [facility] [address], [place], [place] [address]    Culture  Setup Time   Final    GRAM POSITIVE COCCI GRAM NEGATIVE RODS IN BOTH AEROBIC AND ANAEROBIC BOTTLES CRITICAL VALUE NOTED.  VALUE IS CONSISTENT WITH PREVIOUSLY REPORTED AND CALLED VALUE.    Culture (A)  Final    ESCHERICHIA COLI GROUP B STREP(S.AGALACTIAE)ISOLATED SUSCEPTIBILITIES PERFORMED ON PREVIOUS CULTURE WITHIN THE LAST 5 DAYS. Performed at [facility], [place] [address], [place],  [address]    Report Status 11/28/2018 FINAL  Final  Culture, blood (routine x 2)     Status: Abnormal   Collection Time: 11/25/18  9:08 AM  Result Value Ref Range Status   Specimen Description   Final    BLOOD LEFT ARM Performed at [facility] [address]  [place], [place] [address]    Special Requests   Final    BAA BCAV Performed at [facility] [address], [place], [place] [address]    Culture  Setup Time   Final    GRAM POSITIVE COCCI GRAM NEGATIVE RODS IN BOTH AEROBIC AND ANAEROBIC BOTTLES CRITICAL RESULT CALLED TO, READ BACK BY AND VERIFIED WITH: E GREEN PHARMD 11/26/18 0025 JDW Performed at [facility], [place] [address] [place], [place] [address]    Culture (A)  Final    ESCHERICHIA COLI GROUP B STREP(S.AGALACTIAE)ISOLATED    Report Status 11/28/2018 FINAL  Final   Organism ID, Bacteria ESCHERICHIA COLI  Final   Organism ID, Bacteria GROUP  B STREP(S.AGALACTIAE)ISOLATED  Final      Susceptibility   Escherichia coli - MIC*    AMPICILLIN >=32 RESISTANT Resistant     CEFAZOLIN <=4 SENSITIVE Sensitive     CEFEPIME <=1 SENSITIVE Sensitive     CEFTAZIDIME <=1 SENSITIVE Sensitive     CEFTRIAXONE <=1 SENSITIVE Sensitive     CIPROFLOXACIN >=4 RESISTANT Resistant     GENTAMICIN <=1 SENSITIVE Sensitive     IMIPENEM <=0.25 SENSITIVE Sensitive     TRIMETH/SULFA <=20 SENSITIVE Sensitive     AMPICILLIN/SULBACTAM >=32 RESISTANT Resistant     PIP/TAZO <=4 SENSITIVE Sensitive     Extended ESBL NEGATIVE Sensitive     * ESCHERICHIA COLI   Group b strep(s.agalactiae)isolated - MIC*    CLINDAMYCIN >=1 RESISTANT Resistant     AMPICILLIN <=0.25 SENSITIVE Sensitive     ERYTHROMYCIN >=8 RESISTANT Resistant     VANCOMYCIN 0.5 SENSITIVE Sensitive     CEFTRIAXONE <=0.12 SENSITIVE Sensitive     LEVOFLOXACIN 1 SENSITIVE Sensitive     PENICILLIN SENSITIVE Sensitive     * GROUP B STREP(S.AGALACTIAE)ISOLATED  Blood Culture ID Panel (Reflexed)     Status: Abnormal   Collection Time: 11/25/18  9:08 AM  Result Value Ref Range Status   Enterococcus species NOT DETECTED NOT DETECTED Final   Listeria monocytogenes NOT DETECTED NOT DETECTED Final   Staphylococcus species NOT DETECTED NOT DETECTED Final   Staphylococcus aureus (BCID) NOT DETECTED NOT DETECTED Final   Streptococcus species DETECTED (A) NOT DETECTED Final    Comment: CRITICAL RESULT CALLED TO, READ BACK BY AND VERIFIED WITH: E GREEN PHARMD 11/26/18 0025 JDW    Streptococcus agalactiae DETECTED (A) NOT DETECTED Final    Comment: CRITICAL RESULT CALLED TO, READ BACK BY AND VERIFIED WITH: E GREEN PHARMD 11/26/18 0025 JDW    Streptococcus pneumoniae NOT DETECTED NOT DETECTED Final   Streptococcus pyogenes NOT DETECTED NOT DETECTED Final   Acinetobacter baumannii NOT DETECTED NOT DETECTED Final   Enterobacteriaceae species DETECTED (A) NOT DETECTED Final    Comment: Enterobacteriaceae represent  a large family of gram-negative bacteria, not a single organism. CRITICAL RESULT CALLED TO, READ BACK BY AND VERIFIED WITH: E GREEN PHARMD 11/26/18 0025 JDW    Enterobacter cloacae complex NOT DETECTED NOT DETECTED Final   Escherichia coli DETECTED (A) NOT DETECTED Final    Comment: CRITICAL RESULT CALLED TO, READ BACK BY AND VERIFIED WITH: E GREEN PHARMD 11/26/18 0025 JDW    Klebsiella oxytoca NOT DETECTED NOT DETECTED Final   Klebsiella pneumoniae NOT DETECTED NOT DETECTED Final   Proteus species NOT DETECTED NOT DETECTED Final   Serratia marcescens NOT DETECTED NOT DETECTED Final   Carbapenem resistance NOT DETECTED NOT DETECTED Final   Haemophilus influenzae NOT DETECTED NOT DETECTED Final   Neisseria  meningitidis NOT DETECTED NOT DETECTED Final   Pseudomonas aeruginosa NOT DETECTED NOT DETECTED Final   Candida albicans NOT DETECTED NOT DETECTED Final   Candida glabrata NOT DETECTED NOT DETECTED Final   Candida krusei NOT DETECTED NOT DETECTED Final   Candida parapsilosis NOT DETECTED NOT DETECTED Final   Candida tropicalis NOT DETECTED NOT DETECTED Final    Comment: Performed at [facility], [place] [address] [place], [place] [address]  C difficile quick scan w PCR reflex     Status: Abnormal   Collection Time: 11/27/18  8:00 AM  Result Value Ref Range Status   C Diff antigen POSITIVE (A) NEGATIVE Final   C Diff toxin NEGATIVE NEGATIVE Final   C Diff interpretation Results are indeterminate. See PCR results.  Final    Comment: Performed at [facility], [place] [address], [place], [place] [address]  C. Diff by PCR, Reflexed     Status: Abnormal   Collection Time: 11/27/18  8:00 AM  Result Value Ref Range Status   Toxigenic C. Difficile by PCR POSITIVE (A) NEGATIVE Final    Comment: Positive for toxigenic C. difficile with little to no toxin production. Only treat if clinical presentation suggests symptomatic illness. Performed at [facility], [place] [address], [place], [place] [address]          Radiology Studies: Dg Chest 1 View  Result Date: 11/26/2018 CLINICAL DATA:  Tachypnea.  Stage IV Hodgkin's lymphoma. EXAM: CHEST  1 VIEW COMPARISON:  Chest radiograph November 25, 2018 FINDINGS: Cardiac silhouette is mildly enlarged and unchanged. Pulmonary vascular congestion with small LEFT pleural effusion. No pneumothorax. Strandy densities LEFT lung base. Surgical clips LEFT axilla. RIGHT single-lumen chest Port-A-Cath distal tip projecting mid superior vena cava. Osseous structures are unchanged. IMPRESSION: Stable cardiomegaly and pulmonary vascular congestion. Small LEFT pleural effusion and LEFT lung base atelectasis. Electronically Signed   By: Elon Alas M.D.   On: 11/26/2018 20:32   Ct Abdomen Pelvis W Contrast  Result Date: 11/27/2018 CLINICAL DATA:  Inpatient. Non-Hodgkin lymphoma. Ongoing chemotherapy. Abdominal pain. Rising bilirubin. EXAM: CT ABDOMEN AND PELVIS WITH CONTRAST TECHNIQUE: Multidetector CT imaging of the abdomen and pelvis was performed using the standard protocol following bolus administration of intravenous contrast. CONTRAST:  127mL OMNIPAQUE IOHEXOL 300 MG/ML  SOLN COMPARISON:  11/18/2018 CT chest, abdomen and pelvis. FINDINGS: Lower chest: Small dependent bilateral pleural effusions with passive dependent bibasilar atelectasis, unchanged. Small pericardial effusion appears stable. Hepatobiliary: Normal liver size. No liver mass. Normal gallbladder with no radiopaque cholelithiasis. No biliary ductal dilatation. Pancreas: Normal, with no mass or duct dilation. Spleen: Spleen has decreased and is now normal in size. Craniocaudal splenic length 11.4 cm, previously 13.3 cm. No splenic mass. Adrenals/Urinary Tract: Normal adrenals. Mild bilateral hydroureteronephrosis to the level of the ureterovesical junction. Symmetric mildly delayed contrast nephrograms bilaterally. New vague small low-attenuation 1.6 cm renal cortical focus  in the lateral upper left kidney (series 7/image 12). Otherwise no renal lesions. Markedly distended and otherwise normal bladder. Stomach/Bowel: Normal non-distended stomach. Normal caliber small bowel with no small bowel wall thickening. Normal appendix. Normal large bowel with no diverticulosis, large bowel wall thickening or pericolonic fat stranding. Vascular/Lymphatic: Atherosclerotic nonaneurysmal abdominal aorta. Patent portal, splenic, hepatic and renal veins. Left inguinal lymphadenopathy is decreased, for example previously 1.2 cm, now 0.9 cm (series 2/image 84). Retroperitoneal adenopathy in aortocaval and left para-aortic chains has decreased. Representative 0.8 cm aortocaval node (series 2/image 31), previously 1.2 cm. Representative  0.8 cm left para-aortic node (series 2/image 30), previously 1.2 cm. No pathologically enlarged abdominopelvic nodes. Reproductive: Grossly normal uterus.  No adnexal mass. Other: Trace free fluid in pelvic cul-de-sac. No focal fluid collection. No pneumoperitoneum. Mild anasarca, similar. Musculoskeletal: No aggressive appearing focal osseous lesions. Mild lumbar spondylosis. IMPRESSION: 1. Acute urinary retention with massively distended urinary bladder and mild bilateral hydroureteronephrosis. Recommend urgent bladder catheterization. 2. Evidence of treatment response. Spleen is decreased and now normal in size. Retroperitoneal and left inguinal lymphadenopathy is decreased, with no residual pathologically enlarged abdominopelvic nodes. 3. Small dependent bilateral pleural effusions with passive dependent bibasilar atelectasis, stable. 4. Stable small pericardial effusion. 5. Aortic Atherosclerosis (ICD10-I70.0). These results were called by telephone at the time of interpretation on 11/27/2018 at 11:07 am to [facility], who verbally acknowledged these results. Per the RN, a bladder catheterization was just completed with 2075 cc of urine drained. Electronically  Signed   By: Ilona Sorrel M.D.   On: 11/27/2018 11:31        Scheduled Meds: . sodium chloride   Intravenous Once  . acetaminophen  650 mg Oral Once  . Chlorhexidine Gluconate Cloth  6 each Topical Daily  . diphenhydrAMINE  25 mg Oral Once  . levothyroxine  37.5 mcg Intravenous Daily  . mouth rinse  15 mL Mouth Rinse BID  . nystatin  5 mL Oral QID  . scopolamine  1 patch Transdermal Q72H  . Tbo-filgastrim (GRANIX) SQ  480 mcg Subcutaneous q1800  . vancomycin  125 mg Oral QID   Continuous Infusions: . sodium chloride Stopped (11/25/18 1936)  . sodium chloride    . cefTRIAXone (ROCEPHIN)  IV Stopped (11/27/18 2251)  . fluconazole (DIFLUCAN) IV Stopped (11/27/18 1209)  . potassium chloride 10 mEq (11/28/18 1003)     LOS: 17 days   Time spent= 40 mins     Arsenio Loader, MD [facility]  If 7PM-7AM, please contact night-coverage [email or web address] 11/28/2018, 10:39 AM

## 2018-11-28 NOTE — Progress Notes (Signed)
PT Cancellation Note  Patient Details Name: Cindy Ward MRN: [id number] DOB: 23-Mar-1966   Cancelled Treatment:    Reason Eval/Treat Not Completed: Medical issues which prohibited therapy, per RN, nursing will get pt. Up a later as patient is not at a point at the time PT checked on patient.   Tresa Endo [place] Pager [phone] Office [phone]  11/28/2018, 2:28 PM

## 2018-11-28 NOTE — Progress Notes (Signed)
Pt blood product hung at 1050.  1115 blood bank called made RN aware blood product was not Irradiated.  Try to contact Oncology unable to clarify order. Called Attending Dr. Reesa Chew and advised to stop infusion and and call oncology to be certain. [place] made contact with Oncology gave ok to continue to give. Blood product restarted. Pt remains stable without complaints.

## 2018-11-28 NOTE — Progress Notes (Signed)
INFECTIOUS DISEASE PROGRESS NOTE  ID: Cindy Ward is a 53 y.o. female with  Principal Problem:   Hodgkin lymphoma ([place]) Active Problems:   Lymphadenopathy   Fever   Splenomegaly   Liver enzyme elevation   Thrombocytopenia (HCC)   Unintentional weight loss   Hashimoto's thyroiditis   Goiter   Hypertension   Aortic atherosclerosis (HCC)   Pancytopenia (HCC)   Malnutrition of moderate degree   Normocytic anemia   Neutropenic fever (HCC)   Oral thrush  Subjective: 3 loose BM today  Abtx:  Anti-infectives (From admission, onward)   Start     Dose/Rate Route Frequency Ordered Stop   11/27/18 1130  vancomycin (VANCOCIN) 50 mg/mL oral solution 125 mg     125 mg Oral 4 times daily 11/27/18 1051 12/07/18 0959   11/27/18 0915  metroNIDAZOLE (FLAGYL) IVPB 500 mg  Status:  Discontinued     500 mg 100 mL/hr over 60 Minutes Intravenous Every 8 hours 11/27/18 0912 11/27/18 1051   11/26/18 0200  cefTRIAXone (ROCEPHIN) 2 g in sodium chloride 0.9 % 100 mL IVPB     2 g 200 mL/hr over 30 Minutes Intravenous Daily at bedtime 11/26/18 0059     11/25/18 1200  fluconazole (DIFLUCAN) IVPB 200 mg     200 mg 100 mL/hr over 60 Minutes Intravenous Every 24 hours 11/25/18 0836     11/25/18 1000  vancomycin (VANCOCIN) IVPB 1000 mg/200 mL premix  Status:  Discontinued     1,000 mg 200 mL/hr over 60 Minutes Intravenous Every 12 hours 11/25/18 0848 11/26/18 0827   11/25/18 1000  ceFEPIme (MAXIPIME) 2 g in sodium chloride 0.9 % 100 mL IVPB  Status:  Discontinued     2 g 200 mL/hr over 30 Minutes Intravenous Every 8 hours 11/25/18 0848 11/26/18 0058   11/25/18 0900  vancomycin (VANCOCIN) 1,500 mg in sodium chloride 0.9 % 500 mL IVPB     1,500 mg 250 mL/hr over 120 Minutes Intravenous  Once 11/25/18 0848 11/25/18 1135   11/24/18 1500  fluconazole (DIFLUCAN) tablet 200 mg  Status:  Discontinued     200 mg Oral Daily 11/24/18 1450 11/25/18 0836   11/18/18 0600  ceFAZolin (ANCEF) IVPB 2g/100 mL premix      2 g 200 mL/hr over 30 Minutes Intravenous To Radiology 11/17/18 1243 11/18/18 1130   11/12/18 0800  cefTRIAXone (ROCEPHIN) 2 g in sodium chloride 0.9 % 100 mL IVPB  Status:  Discontinued     2 g 200 mL/hr over 30 Minutes Intravenous Every 24 hours 11/11/18 1711 11/12/18 1057      Medications:  Scheduled: . Chlorhexidine Gluconate Cloth  6 each Topical Daily  . levothyroxine  37.5 mcg Intravenous Daily  . mouth rinse  15 mL Mouth Rinse BID  . multivitamin with minerals  1 tablet Oral Daily  . nystatin  5 mL Oral QID  . scopolamine  1 patch Transdermal Q72H  . Tbo-filgastrim (GRANIX) SQ  480 mcg Subcutaneous q1800  . vancomycin  125 mg Oral QID    Objective: Vital signs in last 24 hours: Temp:  [98.1 F (36.7 C)-98.6 F (37 C)] 98.1 F (36.7 C) (03/05 1105) Pulse Rate:  [88-125] 112 (03/05 1200) Resp:  [17-38] 30 (03/05 1200) BP: (100-124)/(57-75) 109/63 (03/05 1200) SpO2:  [91 %-99 %] 94 % (03/05 1200) Weight:  [66 kg] 66 kg (03/05 0430)   General appearance: alert, cooperative and no distress Resp: clear to auscultation bilaterally Chest wall: no tenderness, mild  erythema, bruising. non-tender. no fluctuance Cardio: regular rate and rhythm GI: normal findings: bowel sounds normal and soft, non-tender  Lab Results Recent Labs    11/27/18 0500 11/27/18 1600 11/28/18 0440  WBC 0.2*  --  0.3*  HGB 8.3*  --  7.9*  HCT 25.9*  --  24.3*  NA 147* 147* 144  K 2.4* 2.8* 3.3*  CL 107 109 110  CO2 29 26 25   BUN 6 6 7   CREATININE 0.59 0.55 0.58   Liver Panel Recent Labs    11/27/18 0500 11/27/18 1600 11/28/18 0440  PROT 5.3* 4.9* 5.0*  ALBUMIN 2.0* 1.7* 1.9*  AST 41 34 41  ALT 58* 53* 60*  ALKPHOS 122 108 110  BILITOT 3.8*  4.2* 4.0* 4.3*  BILIDIR 2.3*  --   --   IBILI 1.5*  --   --    Sedimentation Rate No results for input(s): ESRSEDRATE in the last 72 hours. C-Reactive Protein No results for input(s): CRP in the last 72  hours.  Microbiology: Recent Results (from the past 240 hour(s))  Culture, blood (routine x 2)     Status: None   Collection Time: 11/21/18  2:51 PM  Result Value Ref Range Status   Specimen Description   Final    BLOOD LEFT ANTECUBITAL Performed at [facility] [address], [place], [place] [address]    Special Requests   Final    BOTTLES DRAWN AEROBIC AND ANAEROBIC Blood Culture adequate volume Performed at [facility] [address], [place], [place] [address]    Culture   Final    NO GROWTH 5 DAYS Performed at [facility], [place] [address] [place], [place] [address]    Report Status 11/26/2018 FINAL  Final  Culture, blood (routine x 2)     Status: None   Collection Time: 11/21/18  2:59 PM  Result Value Ref Range Status   Specimen Description   Final    BLOOD RIGHT ANTECUBITAL Performed at [facility] [address], [place], [place] [address]    Special Requests   Final    BOTTLES DRAWN AEROBIC ONLY Blood Culture adequate volume Performed at [facility] [address] [place], [place] [address]    Culture   Final    NO GROWTH 5 DAYS Performed at [facility], [place] [address] [place], [place] [address]    Report Status 11/26/2018 FINAL  Final  Culture, blood (routine x 2)     Status: Abnormal   Collection Time: 11/25/18  9:08 AM  Result Value Ref Range Status   Specimen Description   Final    BLOOD RIGHT HAND Performed at [facility] [address] [place], [place] [address]    Special Requests   Final    BAA BCAV Performed at [facility] [address] [place], [place] [address]    Culture  Setup Time   Final    GRAM POSITIVE COCCI GRAM NEGATIVE RODS IN BOTH AEROBIC AND ANAEROBIC BOTTLES CRITICAL VALUE NOTED.  VALUE IS CONSISTENT WITH PREVIOUSLY REPORTED AND CALLED VALUE.    Culture (A)  Final    ESCHERICHIA  COLI GROUP B STREP(S.AGALACTIAE)ISOLATED SUSCEPTIBILITIES PERFORMED ON PREVIOUS CULTURE WITHIN THE LAST 5 DAYS. Performed at [facility], [place] [address], [place], [place] [address]    Report Status 11/28/2018 FINAL  Final  Culture, blood (routine x 2)  Status: Abnormal   Collection Time: 11/25/18  9:08 AM  Result Value Ref Range Status   Specimen Description   Final    BLOOD LEFT ARM Performed at [facility] [address] [place], [place] [address]    Special Requests   Final    BAA BCAV Performed at [facility] [address], [place], [place] [address]    Culture  Setup Time   Final    GRAM POSITIVE COCCI GRAM NEGATIVE RODS IN BOTH AEROBIC AND ANAEROBIC BOTTLES CRITICAL RESULT CALLED TO, READ BACK BY AND VERIFIED WITH: E GREEN PHARMD 11/26/18 0025 JDW Performed at [facility], [place] [address], [place], [place] [address]    Culture (A)  Final    ESCHERICHIA COLI GROUP B STREP(S.AGALACTIAE)ISOLATED    Report Status 11/28/2018 FINAL  Final   Organism ID, Bacteria ESCHERICHIA COLI  Final   Organism ID, Bacteria GROUP B STREP(S.AGALACTIAE)ISOLATED  Final      Susceptibility   Escherichia coli - MIC*    AMPICILLIN >=32 RESISTANT Resistant     CEFAZOLIN <=4 SENSITIVE Sensitive     CEFEPIME <=1 SENSITIVE Sensitive     CEFTAZIDIME <=1 SENSITIVE Sensitive     CEFTRIAXONE <=1 SENSITIVE Sensitive     CIPROFLOXACIN >=4 RESISTANT Resistant     GENTAMICIN <=1 SENSITIVE Sensitive     IMIPENEM <=0.25 SENSITIVE Sensitive     TRIMETH/SULFA <=20 SENSITIVE Sensitive     AMPICILLIN/SULBACTAM >=32 RESISTANT Resistant     PIP/TAZO <=4 SENSITIVE Sensitive     Extended ESBL NEGATIVE Sensitive     * ESCHERICHIA COLI   Group b strep(s.agalactiae)isolated - MIC*    CLINDAMYCIN >=1 RESISTANT Resistant     AMPICILLIN <=0.25 SENSITIVE Sensitive     ERYTHROMYCIN >=8 RESISTANT Resistant     VANCOMYCIN 0.5 SENSITIVE Sensitive     CEFTRIAXONE  <=0.12 SENSITIVE Sensitive     LEVOFLOXACIN 1 SENSITIVE Sensitive     PENICILLIN SENSITIVE Sensitive     * GROUP B STREP(S.AGALACTIAE)ISOLATED  Blood Culture ID Panel (Reflexed)     Status: Abnormal   Collection Time: 11/25/18  9:08 AM  Result Value Ref Range Status   Enterococcus species NOT DETECTED NOT DETECTED Final   Listeria monocytogenes NOT DETECTED NOT DETECTED Final   Staphylococcus species NOT DETECTED NOT DETECTED Final   Staphylococcus aureus (BCID) NOT DETECTED NOT DETECTED Final   Streptococcus species DETECTED (A) NOT DETECTED Final    Comment: CRITICAL RESULT CALLED TO, READ BACK BY AND VERIFIED WITH: E GREEN PHARMD 11/26/18 0025 JDW    Streptococcus agalactiae DETECTED (A) NOT DETECTED Final    Comment: CRITICAL RESULT CALLED TO, READ BACK BY AND VERIFIED WITH: E GREEN PHARMD 11/26/18 0025 JDW    Streptococcus pneumoniae NOT DETECTED NOT DETECTED Final   Streptococcus pyogenes NOT DETECTED NOT DETECTED Final   Acinetobacter baumannii NOT DETECTED NOT DETECTED Final   Enterobacteriaceae species DETECTED (A) NOT DETECTED Final    Comment: Enterobacteriaceae represent a large family of gram-negative bacteria, not a single organism. CRITICAL RESULT CALLED TO, READ BACK BY AND VERIFIED WITH: E GREEN PHARMD 11/26/18 0025 JDW    Enterobacter cloacae complex NOT DETECTED NOT DETECTED Final   Escherichia coli DETECTED (A) NOT DETECTED Final    Comment: CRITICAL RESULT CALLED TO, READ BACK BY AND VERIFIED WITH: E GREEN PHARMD 11/26/18 0025 JDW    Klebsiella oxytoca NOT DETECTED NOT DETECTED Final   Klebsiella pneumoniae NOT DETECTED NOT DETECTED Final  Proteus species NOT DETECTED NOT DETECTED Final   Serratia marcescens NOT DETECTED NOT DETECTED Final   Carbapenem resistance NOT DETECTED NOT DETECTED Final   Haemophilus influenzae NOT DETECTED NOT DETECTED Final   Neisseria meningitidis NOT DETECTED NOT DETECTED Final   Pseudomonas aeruginosa NOT DETECTED NOT DETECTED Final    Candida albicans NOT DETECTED NOT DETECTED Final   Candida glabrata NOT DETECTED NOT DETECTED Final   Candida krusei NOT DETECTED NOT DETECTED Final   Candida parapsilosis NOT DETECTED NOT DETECTED Final   Candida tropicalis NOT DETECTED NOT DETECTED Final    Comment: Performed at [facility], [place] [address] [place], [place] [address]  C difficile quick scan w PCR reflex     Status: Abnormal   Collection Time: 11/27/18  8:00 AM  Result Value Ref Range Status   C Diff antigen POSITIVE (A) NEGATIVE Final   C Diff toxin NEGATIVE NEGATIVE Final   C Diff interpretation Results are indeterminate. See PCR results.  Final    Comment: Performed at [facility], [place] [address] [place], [place] [address]  C. Diff by PCR, Reflexed     Status: Abnormal   Collection Time: 11/27/18  8:00 AM  Result Value Ref Range Status   Toxigenic C. Difficile by PCR POSITIVE (A) NEGATIVE Final    Comment: Positive for toxigenic C. difficile with little to no toxin production. Only treat if clinical presentation suggests symptomatic illness. Performed at [facility], [place] [address] [place], [place] [address]     Studies/Results: Dg Chest 1 View  Result Date: 11/26/2018 CLINICAL DATA:  Tachypnea.  Stage IV Hodgkin's lymphoma. EXAM: CHEST  1 VIEW COMPARISON:  Chest radiograph November 25, 2018 FINDINGS: Cardiac silhouette is mildly enlarged and unchanged. Pulmonary vascular congestion with small LEFT pleural effusion. No pneumothorax. Strandy densities LEFT lung base. Surgical clips LEFT axilla. RIGHT single-lumen chest Port-A-Cath distal tip projecting mid superior vena cava. Osseous structures are unchanged. IMPRESSION: Stable cardiomegaly and pulmonary vascular congestion. Small LEFT pleural effusion and LEFT lung base atelectasis. Electronically Signed   By: Elon Alas M.D.   On: 11/26/2018 20:32   Ct Abdomen Pelvis W Contrast  Result Date: 11/27/2018 CLINICAL DATA:   Inpatient. Non-Hodgkin lymphoma. Ongoing chemotherapy. Abdominal pain. Rising bilirubin. EXAM: CT ABDOMEN AND PELVIS WITH CONTRAST TECHNIQUE: Multidetector CT imaging of the abdomen and pelvis was performed using the standard protocol following bolus administration of intravenous contrast. CONTRAST:  141mL OMNIPAQUE IOHEXOL 300 MG/ML  SOLN COMPARISON:  11/18/2018 CT chest, abdomen and pelvis. FINDINGS: Lower chest: Small dependent bilateral pleural effusions with passive dependent bibasilar atelectasis, unchanged. Small pericardial effusion appears stable. Hepatobiliary: Normal liver size. No liver mass. Normal gallbladder with no radiopaque cholelithiasis. No biliary ductal dilatation. Pancreas: Normal, with no mass or duct dilation. Spleen: Spleen has decreased and is now normal in size. Craniocaudal splenic length 11.4 cm, previously 13.3 cm. No splenic mass. Adrenals/Urinary Tract: Normal adrenals. Mild bilateral hydroureteronephrosis to the level of the ureterovesical junction. Symmetric mildly delayed contrast nephrograms bilaterally. New vague small low-attenuation 1.6 cm renal cortical focus in the lateral upper left kidney (series 7/image 12). Otherwise no renal lesions. Markedly distended and otherwise normal bladder. Stomach/Bowel: Normal non-distended stomach. Normal caliber small bowel with no small bowel wall thickening. Normal appendix. Normal large bowel with no diverticulosis, large bowel wall thickening or pericolonic fat stranding. Vascular/Lymphatic: Atherosclerotic nonaneurysmal abdominal aorta. Patent portal, splenic, hepatic and renal veins. Left inguinal lymphadenopathy is decreased, for example  previously 1.2 cm, now 0.9 cm (series 2/image 84). Retroperitoneal adenopathy in aortocaval and left para-aortic chains has decreased. Representative 0.8 cm aortocaval node (series 2/image 31), previously 1.2 cm. Representative 0.8 cm left para-aortic node (series 2/image 30), previously 1.2 cm. No  pathologically enlarged abdominopelvic nodes. Reproductive: Grossly normal uterus.  No adnexal mass. Other: Trace free fluid in pelvic cul-de-sac. No focal fluid collection. No pneumoperitoneum. Mild anasarca, similar. Musculoskeletal: No aggressive appearing focal osseous lesions. Mild lumbar spondylosis. IMPRESSION: 1. Acute urinary retention with massively distended urinary bladder and mild bilateral hydroureteronephrosis. Recommend urgent bladder catheterization. 2. Evidence of treatment response. Spleen is decreased and now normal in size. Retroperitoneal and left inguinal lymphadenopathy is decreased, with no residual pathologically enlarged abdominopelvic nodes. 3. Small dependent bilateral pleural effusions with passive dependent bibasilar atelectasis, stable. 4. Stable small pericardial effusion. 5. Aortic Atherosclerosis (ICD10-I70.0). These results were called by telephone at the time of interpretation on 11/27/2018 at 11:07 am to [place], who verbally acknowledged these results. Per the RN, a bladder catheterization was just completed with 2075 cc of urine drained. Electronically Signed   By: Ilona Sorrel M.D.   On: 11/27/2018 11:31     Assessment/Plan: Hodgkin Lymphoma Pancytopenia Port placed on 2-24  CTX- AVD + Adcedris, granix (2-25).  E coli and S agalactiae (4/4) bacteremia C diff thrush Protein Calorie Malnutrition Hypocalcemia, Hypokalemia  Total days of antibiotics: 3 ceftriaxone/flucon. Day 1 po vanco  Continue her current anbx Await her repeat BCx hopefully salvaging her Port.  Watch her WBC, hopefully improving soon.  PLT transfusion today         Bobby Rumpf MD, FACP Infectious Diseases (pager) [phone] [email or web address] 11/28/2018, 12:23 PM  LOS: 17 days

## 2018-11-28 NOTE — Progress Notes (Signed)
[facility] INPATIENT PROGRESS NOTE  SUBJECTIVE: Cindy Ward 53 y.o. female with Stage IV Hodgkin lymphoma. Received Day 1 Cycle 1 AVD+ Adcedrison 11/19/2018.  Afebrile over the past 24 hours. Blood cultures drawn on 11/25/2018 positive for E Coli and Group B Strep (S. Agalactiae) in 4/4 bottles.  Repeat blood cultures drawn on 11/28/2018.  On Ceftriaxone. Remains on fluconazole and Nystatin for thrush.  Having pain and difficulty swallowing, but reports that this is improving.  Denies chest pain and shortness of breath. Has abdominal discomfort in the left lower quadrant of her abdomen.  On oral vancomycin for C. difficile.  Reports one episode of diarrhea.  Reports having a small amount of bleeding from her rectum.  Denies epistaxis, bleeding gums, hemoptysis, and hematuria.  States that she is hungry this morning.  She would like to try to eat some chocolate pudding and Jell-O.  ALLERGIES:  has No Known Allergies.  MEDICATIONS:  Current Facility-Administered Medications  Medication Dose Route Frequency Provider Last Rate Last Dose  . 0.9 %  sodium chloride infusion   Intravenous PRN Mendel Corning, MD   Stopped at 11/25/18 1936  . 0.9 %  sodium chloride infusion   Intravenous Once Rai, Ripudeep K, MD      . acetaminophen (TYLENOL) tablet 650 mg  650 mg Oral Q6H PRN Rai, Ripudeep K, MD   650 mg at 11/25/18 0811   Or  . acetaminophen (TYLENOL) suppository 650 mg  650 mg Rectal Q6H PRN Rai, Ripudeep K, MD   650 mg at 11/25/18 1509  . calcium gluconate 2 g/ 100 mL sodium chloride IVPB  2 g Intravenous Once Amin, Ankit Chirag, MD 100 mL/hr at 11/28/18 0747 2,000 mg at 11/28/18 0747  . cefTRIAXone (ROCEPHIN) 2 g in sodium chloride 0.9 % 100 mL IVPB  2 g Intravenous QHS Dorrene German, [facility]   Stopped at 11/27/18 2251  . Chlorhexidine Gluconate Cloth 2 % PADS 6 each  6 each Topical Daily Rai, Ripudeep K, MD   6 each at 11/27/18 1406  . feeding supplement (BOOST / RESOURCE BREEZE)  liquid 1 Container  1 Container Oral Daily PRN Rai, Ripudeep K, MD      . fluconazole (DIFLUCAN) IVPB 200 mg  200 mg Intravenous Q24H Rai, Vernelle Emerald, MD   Stopped at 11/27/18 1209  . HYDROmorphone (DILAUDID) injection 0.5-1 mg  0.5-1 mg Intravenous Q4H PRN Rai, Ripudeep K, MD   1 mg at 11/26/18 1058  . ibuprofen (ADVIL,MOTRIN) tablet 400 mg  400 mg Oral Q8H PRN Rai, Ripudeep K, MD   400 mg at 11/18/18 2334  . iohexol (OMNIPAQUE) 300 MG/ML solution 15 mL  15 mL Oral Once PRN Rai, Ripudeep K, MD   30 mL at 11/18/18 1740  . levothyroxine (SYNTHROID, LEVOTHROID) injection 37.5 mcg  37.5 mcg Intravenous Daily Rai, Ripudeep K, MD   37.5 mcg at 11/28/18 0750  . lip balm (CARMEX) ointment   Topical PRN Rai, Ripudeep K, MD      . MEDLINE mouth rinse  15 mL Mouth Rinse BID Rai, Ripudeep K, MD   15 mL at 11/27/18 2215  . nystatin (MYCOSTATIN) 100000 UNIT/ML suspension 500,000 Units  5 mL Oral QID Rai, Ripudeep K, MD   500,000 Units at 11/27/18 2216  . ondansetron (ZOFRAN) tablet 4 mg  4 mg Oral Q6H PRN Rai, Ripudeep K, MD       Or  . ondansetron (ZOFRAN) injection 4 mg  4 mg Intravenous  Q6H PRN Rai, Vernelle Emerald, MD   4 mg at 11/27/18 2227  . oxyCODONE (Oxy IR/ROXICODONE) immediate release tablet 5-10 mg  5-10 mg Oral Q4H PRN Rai, Ripudeep K, MD   5 mg at 11/22/18 0130  . phenol (CHLORASEPTIC) mouth spray 1 spray  1 spray Mouth/Throat PRN Rai, Ripudeep K, MD      . potassium chloride 10 mEq in 100 mL IVPB  10 mEq Intravenous Q1 Hr x 4 Amin, Ankit Chirag, MD 100 mL/hr at 11/28/18 0749 10 mEq at 11/28/18 0749  . protein supplement (UNJURY CHICKEN SOUP) powder 8 oz  8 oz Oral BID PRN Rai, Ripudeep K, MD      . scopolamine (TRANSDERM-SCOP) 1 MG/3DAYS 1.5 mg  1 patch Transdermal Q72H Rai, Ripudeep K, MD   1.5 mg at 11/27/18 1406  . sodium chloride flush (NS) 0.9 % injection 10-40 mL  10-40 mL Intracatheter PRN Rai, Ripudeep K, MD      . Tbo-Filgrastim [facility]) injection 480 mcg  480 mcg Subcutaneous q1800 Tish Men, MD   480 mcg at 11/27/18 1705  . vancomycin (VANCOCIN) 50 mg/mL oral solution 125 mg  125 mg Oral QID Maryanna Shape, NP   125 mg at 11/27/18 2215    REVIEW OF SYSTEMS:   Review of Systems  Review of Systems  Constitutional: Positive for malaise/fatigue and weight loss. Negative for chills and fever.  HENT: Positive for sore throat.        Mucositis and oral thrush.  Eyes: Negative.   Respiratory: Negative.   Cardiovascular: Positive for leg swelling. Negative for chest pain.  Gastrointestinal: Positive for abdominal pain and diarrhea. Negative for nausea and vomiting.       Reports a small amount of bleeding per rectum.  Genitourinary: Negative.   Musculoskeletal: Negative.   Skin: Negative.   Neurological: Negative.   Endo/Heme/Allergies:       Bruises easily.    Psychiatric/Behavioral: Negative.       PHYSICAL EXAMINATION:  Vital signs in last 24 hours: Temp:  [98.1 F (36.7 C)-98.6 F (37 C)] 98.1 F (36.7 C) (03/05 0000) Pulse Rate:  [102-118] 113 (03/05 0700) Resp:  [17-38] 23 (03/05 0700) BP: (100-125)/(57-84) 116/64 (03/05 0700) SpO2:  [94 %-99 %] 99 % (03/05 0700) Weight:  [145 lb 8.1 oz (66 kg)] 145 lb 8.1 oz (66 kg) (03/05 0430) Weight change:  Last BM Date: 11/27/18  Intake/Output from previous day: 03/04 0701 - 03/05 0700 In: 1426.9 [IV Piggyback:251.9] Out: 3325 [Urine:3325] General: Alert, awake without distress.  More talkative this morning.  Chronically ill appearing.  Head: Normocephalic atraumatic. Mouth: mucus membranes dry with mucositis and oral thrush noted.  Eyes: No scleral icterus.  Pupils are equal and round reactive to light. Resp: clear to auscultation bilaterally without rhonchi or wheezes or dullness to percussion. Cardio: Tachycardic. Regular rhythm, S1, S2 normal, no murmur, click, rub or gallop.  Trace bilateral pedal edema.  GI: soft, mild tenderness with palpation.  bowel sounds normal; no masses,  no  organomegaly Musculoskeletal: No joint deformity or effusion. Neurological: No motor, sensory deficits.  Intact deep tendon reflexes. Skin: No rashes or lesions.  Portacath/PICC-without erythema  LABORATORY DATA: Lab Results  Component Value Date   WBC 0.3 (LL) 11/28/2018   HGB 7.9 (L) 11/28/2018   HCT 24.3 (L) 11/28/2018   MCV 87.4 11/28/2018   PLT 11 (LL) 11/28/2018    CMP Latest Ref Rng & Units 11/28/2018 11/27/2018 11/27/2018  Glucose 70 -  99 mg/dL 141(H) 162(H) -  BUN 6 - 20 mg/dL 7 6 -  Creatinine 0.44 - 1.00 mg/dL 0.58 0.55 -  Sodium 135 - 145 mmol/L 144 147(H) -  Potassium 3.5 - 5.1 mmol/L 3.3(L) 2.8(L) -  Chloride 98 - 111 mmol/L 110 109 -  CO2 22 - 32 mmol/L 25 26 -  Calcium 8.9 - 10.3 mg/dL 4.5(LL) 4.0(LL) -  Total Protein 6.5 - 8.1 g/dL 5.0(L) 4.9(L) -  Total Bilirubin 0.3 - 1.2 mg/dL 4.3(H) 4.0(H) 3.8(H)  Alkaline Phos 38 - 126 U/L 110 108 -  AST 15 - 41 U/L 41 34 -  ALT 0 - 44 U/L 60(H) 53(H) -     RADIOGRAPHIC STUDIES:  Dg Chest 1 View  Result Date: 11/26/2018 CLINICAL DATA:  Tachypnea.  Stage IV Hodgkin's lymphoma. EXAM: CHEST  1 VIEW COMPARISON:  Chest radiograph November 25, 2018 FINDINGS: Cardiac silhouette is mildly enlarged and unchanged. Pulmonary vascular congestion with small LEFT pleural effusion. No pneumothorax. Strandy densities LEFT lung base. Surgical clips LEFT axilla. RIGHT single-lumen chest Port-A-Cath distal tip projecting mid superior vena cava. Osseous structures are unchanged. IMPRESSION: Stable cardiomegaly and pulmonary vascular congestion. Small LEFT pleural effusion and LEFT lung base atelectasis. Electronically Signed   By: Elon Alas M.D.   On: 11/26/2018 20:32   Dg Chest 2 View  Result Date: 11/21/2018 CLINICAL DATA:  History of lymphoma.  Patient weak and flushed. EXAM: CHEST - 2 VIEW COMPARISON:  Chest CT, 11/18/2018.  Chest radiographs, 11/11/2018. FINDINGS: Mild enlargement of the cardiopericardial silhouette. No mediastinal or  hilar masses. No convincing adenopathy. Small bilateral pleural effusions. Mild dependent lower lobe atelectasis. Lungs otherwise clear. No pneumothorax. Right anterior chest wall, internal jugular, Port-A-Cath is stable from the prior chest CT. Skeletal structures are unremarkable. IMPRESSION: 1. Findings are similar to the recent prior chest CT. Enlargement of the cardiopericardial silhouette is consistent with the pericardial effusion noted on CT. There are small bilateral pleural effusions with associated lower lobe atelectasis. No convincing pneumonia and no pulmonary edema. Electronically Signed   By: Lajean Manes M.D.   On: 11/21/2018 15:05   Ct Head Wo Contrast  Result Date: 11/25/2018 CLINICAL DATA:  Hodgkin's lymphoma, fevers, headache EXAM: CT HEAD WITHOUT CONTRAST TECHNIQUE: Contiguous axial images were obtained from the base of the skull through the vertex without intravenous contrast. COMPARISON:  11/25/2018 FINDINGS: Brain: No evidence of acute infarction, hemorrhage, hydrocephalus, extra-axial collection or mass lesion/mass effect. Mild periventricular white matter hypodensity. Vascular: No hyperdense vessel or unexpected calcification. Skull: Normal. Negative for fracture or focal lesion. Sinuses/Orbits: No acute finding. Other: None. IMPRESSION: No acute intracranial pathology. No non-contrast CT findings to explain headache. Mild small-vessel white matter disease. Electronically Signed   By: Eddie Candle M.D.   On: 11/25/2018 12:37   Ct Soft Tissue Neck W Contrast  Result Date: 11/18/2018 CLINICAL DATA:  Initial evaluation for Hodgkin's lymphoma, initial workup. EXAM: CT NECK WITH CONTRAST CT CHEST, ABDOMEN, AND PELVIS WITH CONTRAST TECHNIQUE: Multidetector CT imaging of the chest, abdomen and pelvis was performed following the standard protocol during bolus administration of intravenous contrast. CONTRAST:  139m OMNIPAQUE IOHEXOL 300 MG/ML SOLN, 315mOMNIPAQUE IOHEXOL 300 MG/ML SOLN  COMPARISON:  None. FINDINGS: CT NECK FINDINGS: Pharynx and larynx: Oral cavity within normal limits without mass lesion or loculated collection. Patient is largely edentulous. Calcified tonsilliths noted within the right palatine tonsil. Tonsils themselves symmetric and within normal limits bilaterally. Parapharyngeal fat maintained. Nasopharynx normal. No retropharyngeal collection. Epiglottis  normal. Vallecula clear. Remainder of the hypopharynx and supraglottic larynx normal. Glottis within normal limits. Subglottic airway clear. Salivary glands: Parotid and submandibular glands within normal limits. Thyroid: Thyroid diffusely enlarged without discrete nodule or mass. Lymph nodes: Mildly prominent level II lymph nodes measure up to 9 mm in short axis bilaterally. Left level III nodes measure up to 9 mm as well. Increased number of shotty subcentimeter nodes within the neck bilaterally, left slightly worse than right. No other pathologically enlarged lymph nodes identified within the neck. Vascular: Normal intravascular enhancement seen throughout the neck. Right IJ approach central venous catheter in place. Soft tissue swelling with stranding and scattered foci of soft tissue emphysema within the right neck likely related to central line placement. Limited intracranial: Unremarkable Visualized orbits: Partially visualized inferior globes and orbits unremarkable. Mastoids and visualized paranasal sinuses: Mucosal thickening noted within the visualized maxillary and sphenoid sinuses. Visualized paranasal sinuses are otherwise clear. Visualize mastoids and middle ear cavities are clear. Skeleton: No acute osseous abnormality. No discrete lytic or blastic osseous lesions. Mild cervical spondylolysis present at C5-6. Other: None. CT CHEST FINDINGS Cardiovascular: Normal intravascular enhancement seen throughout the intra-abdominal aorta without aneurysm or other acute abnormality. Minimal atheromatous plaque within  the aortic arch. Visualized great vessels within normal limits. Heart size normal. Small moderate pericardial effusion, simple fluid density. Mediastinum/Nodes: Enlarged nodes within the upper mediastinum measure up to 15 mm in short axis (series 3, image 11). No appreciable supraclavicular adenopathy. No other pathologically enlarged mediastinal lymph nodes. Mild soft tissue density within the right hilum without frank adenopathy. Mildly enlarged 11 mm left hilar node (series 3, image 28). Enlarged axillary adenopathy seen bilaterally, with the largest node on the left measuring 2.1 cm in short axis (series 3, image 16). Largest node on the right measures 1.7 cm in short axis (series 3, image 8). Scattered soft tissue stranding with emphysema and fluid density overlying the left axilla likely related to recent lymph node biopsy, partially visualized. Esophagus within normal limits. Lungs/Pleura: Tracheobronchial tree intact and patent. Moderate layering bilateral pleural effusions with associated atelectasis. No other airspace consolidation. No pulmonary edema. No pneumothorax. 4 mm subpleural ground-glass nodule present at the anterior right upper lobe (series 4, image 57), indeterminate. No other worrisome pulmonary nodule or mass. Musculoskeletal: No acute osseous finding. No discrete lytic or blastic osseous lesions. CT ABDOMEN PELVIS FINDINGS Hepatobiliary: Liver demonstrates a normal contrast enhanced appearance. Gallbladder within normal limits. No biliary dilatation. Pancreas: Pancreas within normal limits. Spleen: Spleen enlarged measuring 14.3 cm in craniocaudad dimension. Few subtle hypodensities noted within the spleen, largest of which measures approximately 12 mm (series 3, image 57), indeterminate. Adrenals/Urinary Tract: Adrenal glands are normal. Kidneys equal in size with symmetric enhancement. No nephrolithiasis, hydronephrosis, or focal enhancing renal mass. No hydroureter. Bladder within normal  limits. Stomach/Bowel: Stomach within normal limits. No evidence for bowel obstruction. Normal appendix. No acute inflammatory changes seen about the bowels. Vascular/Lymphatic: Normal intravascular enhancement seen throughout the intra-abdominal aorta. Mild aorto bi-iliac atherosclerotic disease. No aneurysm. Mesenteric vessels patent proximally. Enlarged 15 mm aortocaval lymph node (series 3, image 7). Additional multifocal shotty subcentimeter aortocaval and periaortic lymph nodes noted. No other pathologically enlarged intra-abdominal lymph nodes identified. Mildly prominent 12 mm left inguinal lymph nodes noted. No other adenopathy within the pelvis. Reproductive: Uterus within normal limits.  Ovaries normal. Other: Moderate volume free fluid within the pelvis, measuring simple fluid density. No free intraperitoneal air. Musculoskeletal: Scattered anasarca noted within the external soft tissues.  No acute osseous finding. No discrete lytic or blastic osseous lesions. IMPRESSION: CT NECK IMPRESSION 1. Increased number of shoddy subcentimeter lymph nodes throughout the neck bilaterally, left slightly worse than right. No pathologically enlarged lymph nodes identified. 2. Mild diffuse thyromegaly without discrete thyroid nodule or mass. 3. Mild soft tissue stranding with emphysema within the right lateral neck related to recent right-sided Port-A-Cath placement. CT CHEST IMPRESSION 1. Enlarged bilateral axillary and upper mediastinal adenopathy as above, likely related to provided history of lymphoma. Additional borderline enlarged left hilar node may be related to lymphoma or possibly reactive in nature. Attention at follow-up recommended. 2. Moderate layering bilateral pleural effusions with associated atelectasis. 3. Small to moderate pericardial effusion. 4. 4 mm right upper lobe ground-glass nodule, indeterminate. Attention at follow-up recommended. CT ABDOMEN AND PELVIS IMPRESSION 1. Enlarged 15 mm aortocaval  lymph node with mildly enlarged 12 mm left inguinal lymph nodes. Additional increased number of shotty subcentimeter retroperitoneal lymph nodes. Findings most likely related to history of lymphoma. 2. Splenomegaly. 3. Moderate volume free fluid within the pelvis, of uncertain etiology, but could be physiologic and/or related overall volume status. 4. Mild diffuse anasarca. Electronically Signed   By: Jeannine Boga M.D.   On: 11/18/2018 19:56   Ct Chest W Contrast  Result Date: 11/18/2018 CLINICAL DATA:  Initial evaluation for Hodgkin's lymphoma, initial workup. EXAM: CT NECK WITH CONTRAST CT CHEST, ABDOMEN, AND PELVIS WITH CONTRAST TECHNIQUE: Multidetector CT imaging of the chest, abdomen and pelvis was performed following the standard protocol during bolus administration of intravenous contrast. CONTRAST:  172m OMNIPAQUE IOHEXOL 300 MG/ML SOLN, 315mOMNIPAQUE IOHEXOL 300 MG/ML SOLN COMPARISON:  None. FINDINGS: CT NECK FINDINGS: Pharynx and larynx: Oral cavity within normal limits without mass lesion or loculated collection. Patient is largely edentulous. Calcified tonsilliths noted within the right palatine tonsil. Tonsils themselves symmetric and within normal limits bilaterally. Parapharyngeal fat maintained. Nasopharynx normal. No retropharyngeal collection. Epiglottis normal. Vallecula clear. Remainder of the hypopharynx and supraglottic larynx normal. Glottis within normal limits. Subglottic airway clear. Salivary glands: Parotid and submandibular glands within normal limits. Thyroid: Thyroid diffusely enlarged without discrete nodule or mass. Lymph nodes: Mildly prominent level II lymph nodes measure up to 9 mm in short axis bilaterally. Left level III nodes measure up to 9 mm as well. Increased number of shotty subcentimeter nodes within the neck bilaterally, left slightly worse than right. No other pathologically enlarged lymph nodes identified within the neck. Vascular: Normal intravascular  enhancement seen throughout the neck. Right IJ approach central venous catheter in place. Soft tissue swelling with stranding and scattered foci of soft tissue emphysema within the right neck likely related to central line placement. Limited intracranial: Unremarkable Visualized orbits: Partially visualized inferior globes and orbits unremarkable. Mastoids and visualized paranasal sinuses: Mucosal thickening noted within the visualized maxillary and sphenoid sinuses. Visualized paranasal sinuses are otherwise clear. Visualize mastoids and middle ear cavities are clear. Skeleton: No acute osseous abnormality. No discrete lytic or blastic osseous lesions. Mild cervical spondylolysis present at C5-6. Other: None. CT CHEST FINDINGS Cardiovascular: Normal intravascular enhancement seen throughout the intra-abdominal aorta without aneurysm or other acute abnormality. Minimal atheromatous plaque within the aortic arch. Visualized great vessels within normal limits. Heart size normal. Small moderate pericardial effusion, simple fluid density. Mediastinum/Nodes: Enlarged nodes within the upper mediastinum measure up to 15 mm in short axis (series 3, image 11). No appreciable supraclavicular adenopathy. No other pathologically enlarged mediastinal lymph nodes. Mild soft tissue density within the right hilum without  frank adenopathy. Mildly enlarged 11 mm left hilar node (series 3, image 28). Enlarged axillary adenopathy seen bilaterally, with the largest node on the left measuring 2.1 cm in short axis (series 3, image 16). Largest node on the right measures 1.7 cm in short axis (series 3, image 8). Scattered soft tissue stranding with emphysema and fluid density overlying the left axilla likely related to recent lymph node biopsy, partially visualized. Esophagus within normal limits. Lungs/Pleura: Tracheobronchial tree intact and patent. Moderate layering bilateral pleural effusions with associated atelectasis. No other  airspace consolidation. No pulmonary edema. No pneumothorax. 4 mm subpleural ground-glass nodule present at the anterior right upper lobe (series 4, image 57), indeterminate. No other worrisome pulmonary nodule or mass. Musculoskeletal: No acute osseous finding. No discrete lytic or blastic osseous lesions. CT ABDOMEN PELVIS FINDINGS Hepatobiliary: Liver demonstrates a normal contrast enhanced appearance. Gallbladder within normal limits. No biliary dilatation. Pancreas: Pancreas within normal limits. Spleen: Spleen enlarged measuring 14.3 cm in craniocaudad dimension. Few subtle hypodensities noted within the spleen, largest of which measures approximately 12 mm (series 3, image 57), indeterminate. Adrenals/Urinary Tract: Adrenal glands are normal. Kidneys equal in size with symmetric enhancement. No nephrolithiasis, hydronephrosis, or focal enhancing renal mass. No hydroureter. Bladder within normal limits. Stomach/Bowel: Stomach within normal limits. No evidence for bowel obstruction. Normal appendix. No acute inflammatory changes seen about the bowels. Vascular/Lymphatic: Normal intravascular enhancement seen throughout the intra-abdominal aorta. Mild aorto bi-iliac atherosclerotic disease. No aneurysm. Mesenteric vessels patent proximally. Enlarged 15 mm aortocaval lymph node (series 3, image 7). Additional multifocal shotty subcentimeter aortocaval and periaortic lymph nodes noted. No other pathologically enlarged intra-abdominal lymph nodes identified. Mildly prominent 12 mm left inguinal lymph nodes noted. No other adenopathy within the pelvis. Reproductive: Uterus within normal limits.  Ovaries normal. Other: Moderate volume free fluid within the pelvis, measuring simple fluid density. No free intraperitoneal air. Musculoskeletal: Scattered anasarca noted within the external soft tissues. No acute osseous finding. No discrete lytic or blastic osseous lesions. IMPRESSION: CT NECK IMPRESSION 1. Increased  number of shoddy subcentimeter lymph nodes throughout the neck bilaterally, left slightly worse than right. No pathologically enlarged lymph nodes identified. 2. Mild diffuse thyromegaly without discrete thyroid nodule or mass. 3. Mild soft tissue stranding with emphysema within the right lateral neck related to recent right-sided Port-A-Cath placement. CT CHEST IMPRESSION 1. Enlarged bilateral axillary and upper mediastinal adenopathy as above, likely related to provided history of lymphoma. Additional borderline enlarged left hilar node may be related to lymphoma or possibly reactive in nature. Attention at follow-up recommended. 2. Moderate layering bilateral pleural effusions with associated atelectasis. 3. Small to moderate pericardial effusion. 4. 4 mm right upper lobe ground-glass nodule, indeterminate. Attention at follow-up recommended. CT ABDOMEN AND PELVIS IMPRESSION 1. Enlarged 15 mm aortocaval lymph node with mildly enlarged 12 mm left inguinal lymph nodes. Additional increased number of shotty subcentimeter retroperitoneal lymph nodes. Findings most likely related to history of lymphoma. 2. Splenomegaly. 3. Moderate volume free fluid within the pelvis, of uncertain etiology, but could be physiologic and/or related overall volume status. 4. Mild diffuse anasarca. Electronically Signed   By: Jeannine Boga M.D.   On: 11/18/2018 19:56   Ct Abdomen Pelvis W Contrast  Result Date: 11/27/2018 CLINICAL DATA:  Inpatient. Non-Hodgkin lymphoma. Ongoing chemotherapy. Abdominal pain. Rising bilirubin. EXAM: CT ABDOMEN AND PELVIS WITH CONTRAST TECHNIQUE: Multidetector CT imaging of the abdomen and pelvis was performed using the standard protocol following bolus administration of intravenous contrast. CONTRAST:  122m OMNIPAQUE IOHEXOL  300 MG/ML  SOLN COMPARISON:  11/18/2018 CT chest, abdomen and pelvis. FINDINGS: Lower chest: Small dependent bilateral pleural effusions with passive dependent bibasilar  atelectasis, unchanged. Small pericardial effusion appears stable. Hepatobiliary: Normal liver size. No liver mass. Normal gallbladder with no radiopaque cholelithiasis. No biliary ductal dilatation. Pancreas: Normal, with no mass or duct dilation. Spleen: Spleen has decreased and is now normal in size. Craniocaudal splenic length 11.4 cm, previously 13.3 cm. No splenic mass. Adrenals/Urinary Tract: Normal adrenals. Mild bilateral hydroureteronephrosis to the level of the ureterovesical junction. Symmetric mildly delayed contrast nephrograms bilaterally. New vague small low-attenuation 1.6 cm renal cortical focus in the lateral upper left kidney (series 7/image 12). Otherwise no renal lesions. Markedly distended and otherwise normal bladder. Stomach/Bowel: Normal non-distended stomach. Normal caliber small bowel with no small bowel wall thickening. Normal appendix. Normal large bowel with no diverticulosis, large bowel wall thickening or pericolonic fat stranding. Vascular/Lymphatic: Atherosclerotic nonaneurysmal abdominal aorta. Patent portal, splenic, hepatic and renal veins. Left inguinal lymphadenopathy is decreased, for example previously 1.2 cm, now 0.9 cm (series 2/image 84). Retroperitoneal adenopathy in aortocaval and left para-aortic chains has decreased. Representative 0.8 cm aortocaval node (series 2/image 31), previously 1.2 cm. Representative 0.8 cm left para-aortic node (series 2/image 30), previously 1.2 cm. No pathologically enlarged abdominopelvic nodes. Reproductive: Grossly normal uterus.  No adnexal mass. Other: Trace free fluid in pelvic cul-de-sac. No focal fluid collection. No pneumoperitoneum. Mild anasarca, similar. Musculoskeletal: No aggressive appearing focal osseous lesions. Mild lumbar spondylosis. IMPRESSION: 1. Acute urinary retention with massively distended urinary bladder and mild bilateral hydroureteronephrosis. Recommend urgent bladder catheterization. 2. Evidence of treatment  response. Spleen is decreased and now normal in size. Retroperitoneal and left inguinal lymphadenopathy is decreased, with no residual pathologically enlarged abdominopelvic nodes. 3. Small dependent bilateral pleural effusions with passive dependent bibasilar atelectasis, stable. 4. Stable small pericardial effusion. 5. Aortic Atherosclerosis (ICD10-I70.0). These results were called by telephone at the time of interpretation on 11/27/2018 at 11:07 am to [place], who verbally acknowledged these results. Per the RN, a bladder catheterization was just completed with 2075 cc of urine drained. Electronically Signed   By: Ilona Sorrel M.D.   On: 11/27/2018 11:31   Ct Abdomen Pelvis W Contrast  Result Date: 11/18/2018 CLINICAL DATA:  Initial evaluation for Hodgkin's lymphoma, initial workup. EXAM: CT NECK WITH CONTRAST CT CHEST, ABDOMEN, AND PELVIS WITH CONTRAST TECHNIQUE: Multidetector CT imaging of the chest, abdomen and pelvis was performed following the standard protocol during bolus administration of intravenous contrast. CONTRAST:  144m OMNIPAQUE IOHEXOL 300 MG/ML SOLN, 338mOMNIPAQUE IOHEXOL 300 MG/ML SOLN COMPARISON:  None. FINDINGS: CT NECK FINDINGS: Pharynx and larynx: Oral cavity within normal limits without mass lesion or loculated collection. Patient is largely edentulous. Calcified tonsilliths noted within the right palatine tonsil. Tonsils themselves symmetric and within normal limits bilaterally. Parapharyngeal fat maintained. Nasopharynx normal. No retropharyngeal collection. Epiglottis normal. Vallecula clear. Remainder of the hypopharynx and supraglottic larynx normal. Glottis within normal limits. Subglottic airway clear. Salivary glands: Parotid and submandibular glands within normal limits. Thyroid: Thyroid diffusely enlarged without discrete nodule or mass. Lymph nodes: Mildly prominent level II lymph nodes measure up to 9 mm in short axis bilaterally. Left level III nodes measure up to  9 mm as well. Increased number of shotty subcentimeter nodes within the neck bilaterally, left slightly worse than right. No other pathologically enlarged lymph nodes identified within the neck. Vascular: Normal intravascular enhancement seen throughout the neck. Right IJ approach central venous catheter  in place. Soft tissue swelling with stranding and scattered foci of soft tissue emphysema within the right neck likely related to central line placement. Limited intracranial: Unremarkable Visualized orbits: Partially visualized inferior globes and orbits unremarkable. Mastoids and visualized paranasal sinuses: Mucosal thickening noted within the visualized maxillary and sphenoid sinuses. Visualized paranasal sinuses are otherwise clear. Visualize mastoids and middle ear cavities are clear. Skeleton: No acute osseous abnormality. No discrete lytic or blastic osseous lesions. Mild cervical spondylolysis present at C5-6. Other: None. CT CHEST FINDINGS Cardiovascular: Normal intravascular enhancement seen throughout the intra-abdominal aorta without aneurysm or other acute abnormality. Minimal atheromatous plaque within the aortic arch. Visualized great vessels within normal limits. Heart size normal. Small moderate pericardial effusion, simple fluid density. Mediastinum/Nodes: Enlarged nodes within the upper mediastinum measure up to 15 mm in short axis (series 3, image 11). No appreciable supraclavicular adenopathy. No other pathologically enlarged mediastinal lymph nodes. Mild soft tissue density within the right hilum without frank adenopathy. Mildly enlarged 11 mm left hilar node (series 3, image 28). Enlarged axillary adenopathy seen bilaterally, with the largest node on the left measuring 2.1 cm in short axis (series 3, image 16). Largest node on the right measures 1.7 cm in short axis (series 3, image 8). Scattered soft tissue stranding with emphysema and fluid density overlying the left axilla likely related  to recent lymph node biopsy, partially visualized. Esophagus within normal limits. Lungs/Pleura: Tracheobronchial tree intact and patent. Moderate layering bilateral pleural effusions with associated atelectasis. No other airspace consolidation. No pulmonary edema. No pneumothorax. 4 mm subpleural ground-glass nodule present at the anterior right upper lobe (series 4, image 57), indeterminate. No other worrisome pulmonary nodule or mass. Musculoskeletal: No acute osseous finding. No discrete lytic or blastic osseous lesions. CT ABDOMEN PELVIS FINDINGS Hepatobiliary: Liver demonstrates a normal contrast enhanced appearance. Gallbladder within normal limits. No biliary dilatation. Pancreas: Pancreas within normal limits. Spleen: Spleen enlarged measuring 14.3 cm in craniocaudad dimension. Few subtle hypodensities noted within the spleen, largest of which measures approximately 12 mm (series 3, image 57), indeterminate. Adrenals/Urinary Tract: Adrenal glands are normal. Kidneys equal in size with symmetric enhancement. No nephrolithiasis, hydronephrosis, or focal enhancing renal mass. No hydroureter. Bladder within normal limits. Stomach/Bowel: Stomach within normal limits. No evidence for bowel obstruction. Normal appendix. No acute inflammatory changes seen about the bowels. Vascular/Lymphatic: Normal intravascular enhancement seen throughout the intra-abdominal aorta. Mild aorto bi-iliac atherosclerotic disease. No aneurysm. Mesenteric vessels patent proximally. Enlarged 15 mm aortocaval lymph node (series 3, image 7). Additional multifocal shotty subcentimeter aortocaval and periaortic lymph nodes noted. No other pathologically enlarged intra-abdominal lymph nodes identified. Mildly prominent 12 mm left inguinal lymph nodes noted. No other adenopathy within the pelvis. Reproductive: Uterus within normal limits.  Ovaries normal. Other: Moderate volume free fluid within the pelvis, measuring simple fluid density. No  free intraperitoneal air. Musculoskeletal: Scattered anasarca noted within the external soft tissues. No acute osseous finding. No discrete lytic or blastic osseous lesions. IMPRESSION: CT NECK IMPRESSION 1. Increased number of shoddy subcentimeter lymph nodes throughout the neck bilaterally, left slightly worse than right. No pathologically enlarged lymph nodes identified. 2. Mild diffuse thyromegaly without discrete thyroid nodule or mass. 3. Mild soft tissue stranding with emphysema within the right lateral neck related to recent right-sided Port-A-Cath placement. CT CHEST IMPRESSION 1. Enlarged bilateral axillary and upper mediastinal adenopathy as above, likely related to provided history of lymphoma. Additional borderline enlarged left hilar node may be related to lymphoma or possibly reactive in nature. Attention  at follow-up recommended. 2. Moderate layering bilateral pleural effusions with associated atelectasis. 3. Small to moderate pericardial effusion. 4. 4 mm right upper lobe ground-glass nodule, indeterminate. Attention at follow-up recommended. CT ABDOMEN AND PELVIS IMPRESSION 1. Enlarged 15 mm aortocaval lymph node with mildly enlarged 12 mm left inguinal lymph nodes. Additional increased number of shotty subcentimeter retroperitoneal lymph nodes. Findings most likely related to history of lymphoma. 2. Splenomegaly. 3. Moderate volume free fluid within the pelvis, of uncertain etiology, but could be physiologic and/or related overall volume status. 4. Mild diffuse anasarca. Electronically Signed   By: Jeannine Boga M.D.   On: 11/18/2018 19:56   Ct Biopsy  Result Date: 11/15/2018 INDICATION: Pancytopenia, concern for lymphoproliferative process EXAM: CT GUIDED RIGHT ILIAC BONE MARROW ASPIRATION AND CORE BIOPSY Date:  11/15/2018 11/15/2018 10:13 am Radiologist:  M. Daryll Brod, MD Guidance:  CT FLUOROSCOPY TIME:  Fluoroscopy Time: None. MEDICATIONS: 1% lidocaine local ANESTHESIA/SEDATION: 2.0  mg IV Versed; 50 mcg IV Fentanyl Moderate Sedation Time:  10 minutes The patient was continuously monitored during the procedure by the interventional radiology nurse under my direct supervision. CONTRAST:  None. COMPLICATIONS: None PROCEDURE: Informed consent was obtained from the patient following explanation of the procedure, risks, benefits and alternatives. The patient understands, agrees and consents for the procedure. All questions were addressed. A time out was performed. The patient was positioned prone and non-contrast localization CT was performed of the pelvis to demonstrate the iliac marrow spaces. Maximal barrier sterile technique utilized including caps, mask, sterile gowns, sterile gloves, large sterile drape, hand hygiene, and Betadine prep. Under sterile conditions and local anesthesia, an 11 gauge coaxial bone biopsy needle was advanced into the right iliac marrow space. Needle position was confirmed with CT imaging. Initially, bone marrow aspiration was performed. Next, the 11 gauge outer cannula was utilized to obtain a right iliac bone marrow core biopsy. Needle was removed. Hemostasis was obtained with compression. The patient tolerated the procedure well. Samples were prepared with the cytotechnologist. No immediate complications. IMPRESSION: CT guided right iliac bone marrow aspiration and core biopsy. Electronically Signed   By: Jerilynn Mages.  Shick M.D.   On: 11/15/2018 11:25   Dg Chest Port 1 View  Result Date: 11/25/2018 CLINICAL DATA:  Dyspnea. EXAM: PORTABLE CHEST 1 VIEW COMPARISON:  November 21, 2018 FINDINGS: The right Port-A-Cath is stable. Small effusion and left basilar opacity remain. Mild pulmonary venous congestion without overt edema. The cardiomediastinal silhouette is stable with cardiomegaly. IMPRESSION: Mild pulmonary venous congestion. Tiny left effusion with underlying opacity in left base, stable. No other change. Electronically Signed   By: Dorise Bullion III M.D   On:  11/25/2018 13:02   Dg Chest Port 1 View  Result Date: 11/11/2018 CLINICAL DATA:  Fever EXAM: PORTABLE CHEST 1 VIEW COMPARISON:  None. FINDINGS: Mild cardiomegaly. No focal consolidation or effusion. No pneumothorax. IMPRESSION: No active disease. Electronically Signed   By: Donavan Foil M.D.   On: 11/11/2018 21:59   Ct Bone Marrow Biopsy & Aspiration  Result Date: 11/15/2018 INDICATION: Pancytopenia, concern for lymphoproliferative process EXAM: CT GUIDED RIGHT ILIAC BONE MARROW ASPIRATION AND CORE BIOPSY Date:  11/15/2018 11/15/2018 10:13 am Radiologist:  M. Daryll Brod, MD Guidance:  CT FLUOROSCOPY TIME:  Fluoroscopy Time: None. MEDICATIONS: 1% lidocaine local ANESTHESIA/SEDATION: 2.0 mg IV Versed; 50 mcg IV Fentanyl Moderate Sedation Time:  10 minutes The patient was continuously monitored during the procedure by the interventional radiology nurse under my direct supervision. CONTRAST:  None. COMPLICATIONS: None PROCEDURE:  Informed consent was obtained from the patient following explanation of the procedure, risks, benefits and alternatives. The patient understands, agrees and consents for the procedure. All questions were addressed. A time out was performed. The patient was positioned prone and non-contrast localization CT was performed of the pelvis to demonstrate the iliac marrow spaces. Maximal barrier sterile technique utilized including caps, mask, sterile gowns, sterile gloves, large sterile drape, hand hygiene, and Betadine prep. Under sterile conditions and local anesthesia, an 11 gauge coaxial bone biopsy needle was advanced into the right iliac marrow space. Needle position was confirmed with CT imaging. Initially, bone marrow aspiration was performed. Next, the 11 gauge outer cannula was utilized to obtain a right iliac bone marrow core biopsy. Needle was removed. Hemostasis was obtained with compression. The patient tolerated the procedure well. Samples were prepared with the  cytotechnologist. No immediate complications. IMPRESSION: CT guided right iliac bone marrow aspiration and core biopsy. Electronically Signed   By: Jerilynn Mages.  Shick M.D.   On: 11/15/2018 11:25   Ir Imaging Guided Port Insertion  Result Date: 11/18/2018 INDICATION: 53 year old female with history lymphoma EXAM: IMPLANTED PORT A CATH PLACEMENT WITH ULTRASOUND AND FLUOROSCOPIC GUIDANCE MEDICATIONS: 2.0 g Ancef; The antibiotic was administered within an appropriate time interval prior to skin puncture. ANESTHESIA/SEDATION: Moderate (conscious) sedation was employed during this procedure. A total of Versed 2.0 mg and Fentanyl 100 mcg was administered intravenously. Moderate Sedation Time: 18 minutes. The patient's level of consciousness and vital signs were monitored continuously by radiology nursing throughout the procedure under my direct supervision. FLUOROSCOPY TIME:  0 minutes, 12 seconds (1.0 mGy) COMPLICATIONS: None PROCEDURE: The procedure, risks, benefits, and alternatives were explained to the patient. Questions regarding the procedure were encouraged and answered. The patient understands and consents to the procedure. Ultrasound survey was performed with images stored and sent to PACs. The right neck and chest was prepped with chlorhexidine, and draped in the usual sterile fashion using maximum barrier technique (cap and mask, sterile gown, sterile gloves, large sterile sheet, hand hygiene and cutaneous antiseptic). Antibiotic prophylaxis was provided with 2.0g Ancef administered IV one hour prior to skin incision. Local anesthesia was attained by infiltration with 1% lidocaine without epinephrine. Ultrasound demonstrated patency of the right internal jugular vein, and this was documented with an image. Under real-time ultrasound guidance, this vein was accessed with a 21 gauge micropuncture needle and image documentation was performed. A small dermatotomy was made at the access site with an 11 scalpel. A 0.018"  wire was advanced into the SVC and used to estimate the length of the internal catheter. The access needle exchanged for a 77F micropuncture vascular sheath. The 0.018" wire was then removed and a 0.035" wire advanced into the IVC. An appropriate location for the subcutaneous reservoir was selected below the clavicle and an incision was made through the skin and underlying soft tissues. The subcutaneous tissues were then dissected using a combination of blunt and sharp surgical technique and a pocket was formed. A single lumen power injectable portacatheter was then tunneled through the subcutaneous tissues from the pocket to the dermatotomy and the port reservoir placed within the subcutaneous pocket. The venous access site was then serially dilated and a peel away vascular sheath placed over the wire. The wire was removed and the port catheter advanced into position under fluoroscopic guidance. The catheter tip is positioned in the cavoatrial junction. This was documented with a spot image. The portacatheter was then tested and found to flush and aspirate  well. The port was flushed with saline followed by 100 units/mL heparinized saline. The pocket was then closed in two layers using first subdermal inverted interrupted absorbable sutures followed by a running subcuticular suture. The epidermis was then sealed with Dermabond. The dermatotomy at the venous access site was also seal with Dermabond. Patient tolerated the procedure well and remained hemodynamically stable throughout. No complications encountered and no significant blood loss encountered IMPRESSION: Status post right IJ port catheter placement. Catheter ready for use. Signed, Dulcy Fanny. Dellia Nims, RPVI [facility] Radiology Electronically Signed   By: Corrie Mckusick D.O.   On: 11/18/2018 11:23     ASSESSMENT/PLAN:  Stage IV Hodgkin lymphoma -Port placed in 10/2018; TTE showed normal LVEF -The patient  received day 1 of cycle 1 ofAVD + Adcedris on 11/19/2018.; received for Granix x 6 days so far, starting2/26/2020. Remains neutropenic. Granix will be continued. -Recommend PRN anti-emetics, including Zofran and Compazine -CT scan of the abdomen pelvis showed the intra-abdominal lymph nodes appear to be improving.  Spleen is decreased in size.  Normocytic anemia -Likely secondary to anemia of chronic disease and underlying lymphoma -Hemoglobin is 7.9. No transfusion indicated today. -Supportive transfusion to keep Hgb > 7. Blood products to be irradiated.   Thrombocytopenia -Likely secondary to underlying lymphoma and recent chemotherapy. -Platelets 11,000.  Reported a small amount of rectal bleeding to me this morning.  Will transfuse 1 unit of irradiated platelets. -Daily CBC -Goal plts >10k in the absence of bleeding. Blood products to be irradiated.    Persistent fever -Secondary to underlying lymphoma and bacteremia.  -Patient has + blood cultures (E Coli and Group B Strep) -Continue Ceftriaxone. -Fungal culture is pending. -Appreciate assistance from infectious disease. -Recommend monitoring the patient to ensure that she is fever-free for at least 24to 48hrs. without Tylenol before discharge.  Severe protein malnutrition -Patient has lost > 20 lbs since the onset of her symptoms, and her most recent albumin was 2.0 with anasarca, consistent with severe protein malnutrition -Dietitian is following. -If her nutrition remains very poor and she is unable to increase her PO intake, we will have to consider enteral nutrition, such as PEG tube, to help improve her nutritional intake  Diarrhea with abdominal discomfort -Stool for c. Diff Positive.  -Continue oral vancomycin. -CT the abdomen pelvis did not show any concerning findings in the left lower quadrant of her abdomen. -Continue pain meds.  Electrolyte abnormalities -Electrolyte abnormalities are improving this  morning.  Electrolytes are being checked twice a day.  Replete per primary team.  Hyperbilirubinemia -Unclear etiology.  CT scan did not show any focal liver abnormality or obstruction. -Discussed with hospitalist.  May consider obtaining an ultrasound of the abdomen if this persists.  Urinary retention -Currently has a Foley catheter in place. -Consider DC of Foley catheter within the next 24 hours.  Deconditioning -I discussed with the patient I recommend she get up to the chair for 1 to 2 hours today. -Recommend PT consult per primary team.   Patient's daughter, Anderson Malta, [phone], requests periodic updates. Will call later today.    LOS: 17 days   Mikey Bussing, DNP, AGPCNP-BC, AOCNP 11/28/18

## 2018-11-29 ENCOUNTER — Inpatient Hospital Stay (HOSPITAL_COMMUNITY): Payer: Medicaid Other

## 2018-11-29 DIAGNOSIS — R0689 Other abnormalities of breathing: Secondary | ICD-10-CM

## 2018-11-29 DIAGNOSIS — Z515 Encounter for palliative care: Secondary | ICD-10-CM

## 2018-11-29 DIAGNOSIS — Z7189 Other specified counseling: Secondary | ICD-10-CM

## 2018-11-29 DIAGNOSIS — R000 Tachycardia, unspecified: Secondary | ICD-10-CM

## 2018-11-29 DIAGNOSIS — R21 Rash and other nonspecific skin eruption: Secondary | ICD-10-CM

## 2018-11-29 LAB — CBC
HCT: 25.8 % — ABNORMAL LOW (ref 36.0–46.0)
Hemoglobin: 8 g/dL — ABNORMAL LOW (ref 12.0–15.0)
MCH: 27.8 pg (ref 26.0–34.0)
MCHC: 31 g/dL (ref 30.0–36.0)
MCV: 89.6 fL (ref 80.0–100.0)
Platelets: 38 10*3/uL — ABNORMAL LOW (ref 150–400)
RBC: 2.88 MIL/uL — ABNORMAL LOW (ref 3.87–5.11)
RDW: 18.1 % — AB (ref 11.5–15.5)
WBC: 0.4 10*3/uL — CL (ref 4.0–10.5)
nRBC: 0 % (ref 0.0–0.2)

## 2018-11-29 LAB — COMPREHENSIVE METABOLIC PANEL
ALT: 59 U/L — ABNORMAL HIGH (ref 0–44)
AST: 33 U/L (ref 15–41)
Albumin: 2 g/dL — ABNORMAL LOW (ref 3.5–5.0)
Alkaline Phosphatase: 106 U/L (ref 38–126)
Anion gap: 9 (ref 5–15)
BUN: 9 mg/dL (ref 6–20)
CO2: 25 mmol/L (ref 22–32)
Calcium: 5.6 mg/dL — CL (ref 8.9–10.3)
Chloride: 109 mmol/L (ref 98–111)
Creatinine, Ser: 0.61 mg/dL (ref 0.44–1.00)
GFR calc Af Amer: >60 mL/min (ref >60)
GFR calc non Af Amer: >60 mL/min (ref >60)
Glucose, Bld: 159 mg/dL — ABNORMAL HIGH (ref 70–99)
POTASSIUM: 3.5 mmol/L (ref 3.5–5.1)
Sodium: 143 mmol/L (ref 135–145)
Total Bilirubin: 5.2 mg/dL — ABNORMAL HIGH (ref 0.3–1.2)
Total Protein: 5.1 g/dL — ABNORMAL LOW (ref 6.5–8.1)

## 2018-11-29 LAB — PREPARE PLATELET PHERESIS: Unit division: 0

## 2018-11-29 LAB — BPAM PLATELET PHERESIS
Blood Product Expiration Date: 202003062359
ISSUE DATE / TIME: 202003051045
UNIT TYPE AND RH: 7300

## 2018-11-29 LAB — PHOSPHORUS: Phosphorus: 1.7 mg/dL — ABNORMAL LOW (ref 2.5–4.6)

## 2018-11-29 LAB — MAGNESIUM: MAGNESIUM: 2.2 mg/dL (ref 1.7–2.4)

## 2018-11-29 MED ORDER — POTASSIUM CHLORIDE 10 MEQ/100ML IV SOLN
10.0000 meq | INTRAVENOUS | Status: AC
  Administered 2018-11-29 (×3): 10 meq via INTRAVENOUS
  Filled 2018-11-29 (×3): qty 100

## 2018-11-29 MED ORDER — IPRATROPIUM-ALBUTEROL 0.5-2.5 (3) MG/3ML IN SOLN
3.0000 mL | Freq: Three times a day (TID) | RESPIRATORY_TRACT | Status: DC
  Administered 2018-11-30: 3 mL via RESPIRATORY_TRACT
  Filled 2018-11-29: qty 3

## 2018-11-29 MED ORDER — IPRATROPIUM-ALBUTEROL 0.5-2.5 (3) MG/3ML IN SOLN: 3.0000 mL | RESPIRATORY_TRACT | Status: DC | PRN

## 2018-11-29 MED ORDER — POTASSIUM PHOSPHATES 15 MMOLE/5ML IV SOLN
30.0000 mmol | Freq: Once | INTRAVENOUS | Status: AC
  Administered 2018-11-29: 30 mmol via INTRAVENOUS
  Filled 2018-11-29 (×2): qty 10

## 2018-11-29 MED ORDER — CALCIUM GLUCONATE-NACL 2-0.675 GM/100ML-% IV SOLN
2.0000 g | Freq: Once | INTRAVENOUS | Status: AC
  Administered 2018-11-29: 2000 mg via INTRAVENOUS
  Filled 2018-11-29: qty 100

## 2018-11-29 MED ORDER — NEBIVOLOL HCL 10 MG PO TABS
10.0000 mg | ORAL_TABLET | Freq: Every day | ORAL | Status: DC
  Administered 2018-11-29 – 2018-12-12 (×11): 10 mg via ORAL
  Filled 2018-11-29 (×17): qty 1

## 2018-11-29 MED ORDER — MAGNESIUM SULFATE 2 GM/50ML IV SOLN
2.0000 g | Freq: Once | INTRAVENOUS | Status: AC
  Administered 2018-11-29: 2 g via INTRAVENOUS
  Filled 2018-11-29: qty 50

## 2018-11-29 MED ORDER — CALCIUM GLUCONATE-NACL 1-0.675 GM/50ML-% IV SOLN: 1.0000 g | Freq: Once | INTRAVENOUS | Status: AC

## 2018-11-29 MED ORDER — ADULT MULTIVITAMIN W/MINERALS CH: 1.0000 | ORAL_TABLET | Freq: Every day | ORAL | Status: DC

## 2018-11-29 MED ORDER — IPRATROPIUM-ALBUTEROL 0.5-2.5 (3) MG/3ML IN SOLN
3.0000 mL | Freq: Four times a day (QID) | RESPIRATORY_TRACT | Status: DC
  Administered 2018-11-29 (×2): 3 mL via RESPIRATORY_TRACT
  Filled 2018-11-29 (×2): qty 3

## 2018-11-29 NOTE — Progress Notes (Signed)
INFECTIOUS DISEASE PROGRESS NOTE  ID: Cindy Ward is a 53 y.o. female with  Principal Problem:   Hodgkin lymphoma ([place]) Active Problems:   Lymphadenopathy   Fever   Splenomegaly   Liver enzyme elevation   Thrombocytopenia (HCC)   Unintentional weight loss   Hashimoto's thyroiditis   Goiter   Hypertension   Aortic atherosclerosis (HCC)   Pancytopenia (HCC)   Malnutrition of moderate degree   Normocytic anemia   Neutropenic fever (HCC)   Oral thrush   Enteritis due to Clostridium difficile   Streptococcal bacteremia   Palliative care by specialist   Goals of care, counseling/discussion  Subjective: States she had 3 BM this am She is unaware of facial rash prior.   Abtx:  Anti-infectives (From admission, onward)   Start     Dose/Rate Route Frequency Ordered Stop   11/27/18 1130  vancomycin (VANCOCIN) 50 mg/mL oral solution 125 mg     125 mg Oral 4 times daily 11/27/18 1051 12/07/18 0959   11/27/18 0915  metroNIDAZOLE (FLAGYL) IVPB 500 mg  Status:  Discontinued     500 mg 100 mL/hr over 60 Minutes Intravenous Every 8 hours 11/27/18 0912 11/27/18 1051   11/26/18 0200  cefTRIAXone (ROCEPHIN) 2 g in sodium chloride 0.9 % 100 mL IVPB     2 g 200 mL/hr over 30 Minutes Intravenous Daily at bedtime 11/26/18 0059     11/25/18 1200  fluconazole (DIFLUCAN) IVPB 200 mg     200 mg 100 mL/hr over 60 Minutes Intravenous Every 24 hours 11/25/18 0836     11/25/18 1000  vancomycin (VANCOCIN) IVPB 1000 mg/200 mL premix  Status:  Discontinued     1,000 mg 200 mL/hr over 60 Minutes Intravenous Every 12 hours 11/25/18 0848 11/26/18 0827   11/25/18 1000  ceFEPIme (MAXIPIME) 2 g in sodium chloride 0.9 % 100 mL IVPB  Status:  Discontinued     2 g 200 mL/hr over 30 Minutes Intravenous Every 8 hours 11/25/18 0848 11/26/18 0058   11/25/18 0900  vancomycin (VANCOCIN) 1,500 mg in sodium chloride 0.9 % 500 mL IVPB     1,500 mg 250 mL/hr over 120 Minutes Intravenous  Once 11/25/18 0848  11/25/18 1135   11/24/18 1500  fluconazole (DIFLUCAN) tablet 200 mg  Status:  Discontinued     200 mg Oral Daily 11/24/18 1450 11/25/18 0836   11/18/18 0600  ceFAZolin (ANCEF) IVPB 2g/100 mL premix     2 g 200 mL/hr over 30 Minutes Intravenous To Radiology 11/17/18 1243 11/18/18 1130   11/12/18 0800  cefTRIAXone (ROCEPHIN) 2 g in sodium chloride 0.9 % 100 mL IVPB  Status:  Discontinued     2 g 200 mL/hr over 30 Minutes Intravenous Every 24 hours 11/11/18 1711 11/12/18 1057      Medications:  Scheduled: . Chlorhexidine Gluconate Cloth  6 each Topical Daily  . levothyroxine  37.5 mcg Intravenous Daily  . mouth rinse  15 mL Mouth Rinse BID  . multivitamin with minerals  1 tablet Oral Daily  . nebivolol  10 mg Oral Daily  . nystatin  5 mL Oral QID  . scopolamine  1 patch Transdermal Q72H  . Tbo-filgastrim (GRANIX) SQ  480 mcg Subcutaneous q1800  . vancomycin  125 mg Oral QID    Objective: Vital signs in last 24 hours: Temp:  [98.1 F (36.7 C)-99 F (37.2 C)] 99 F (37.2 C) (03/06 0424) Pulse Rate:  [88-128] 117 (03/06 0900) Resp:  [19-30] 25 (03/06  0900) BP: (98-126)/(51-83) 98/51 (03/06 0900) SpO2:  [91 %-99 %] 95 % (03/06 0900) Weight:  [70.2 kg] 70.2 kg (03/06 0500)   General appearance: alert, cooperative, fatigued and no distress Resp: clear to auscultation bilaterally Cardio: regular rate and rhythm GI: normal findings: bowel sounds normal and soft, non-tender Skin: dense, malar like rash on face.   Lab Results Recent Labs    11/28/18 0440 11/28/18 1736 11/29/18 0500  WBC 0.3*  --  0.4*  HGB 7.9*  --  8.0*  HCT 24.3*  --  25.8*  NA 144 145 143  K 3.3* 3.4* 3.5  CL 110 112* 109  CO2 25 24 25   BUN 7 9 9   CREATININE 0.58 0.57 0.61   Liver Panel Recent Labs    11/27/18 0500  11/28/18 1736 11/29/18 0500  PROT 5.3*   < > 5.1* 5.1*  ALBUMIN 2.0*   < > 1.9* 2.0*  AST 41   < > 36 33  ALT 58*   < > 60* 59*  ALKPHOS 122   < > 109 106  BILITOT 3.8*  4.2*    < > 5.1* 5.2*  BILIDIR 2.3*  --   --   --   IBILI 1.5*  --   --   --    < > = values in this interval not displayed.   Sedimentation Rate No results for input(s): ESRSEDRATE in the last 72 hours. C-Reactive Protein No results for input(s): CRP in the last 72 hours.  Microbiology: Recent Results (from the past 240 hour(s))  Culture, blood (routine x 2)     Status: None   Collection Time: 11/21/18  2:51 PM  Result Value Ref Range Status   Specimen Description   Final    BLOOD LEFT ANTECUBITAL Performed at [facility] [address], [place], [place] [address]    Special Requests   Final    BOTTLES DRAWN AEROBIC AND ANAEROBIC Blood Culture adequate volume Performed at [facility] [address], [place], [place] [address]    Culture   Final    NO GROWTH 5 DAYS Performed at [facility], [place] [address], [place], [place] [address]    Report Status 11/26/2018 FINAL  Final  Culture, blood (routine x 2)     Status: None   Collection Time: 11/21/18  2:59 PM  Result Value Ref Range Status   Specimen Description   Final    BLOOD RIGHT ANTECUBITAL Performed at [facility] [address] [place], [place] [address]    Special Requests   Final    BOTTLES DRAWN AEROBIC ONLY Blood Culture adequate volume Performed at [facility] [address], [place], [place] [address]    Culture   Final    NO GROWTH 5 DAYS Performed at [facility], [place] [address], [place], [place] [address]    Report Status 11/26/2018 FINAL  Final  Culture, blood (routine x 2)     Status: Abnormal   Collection Time: 11/25/18  9:08 AM  Result Value Ref Range Status   Specimen Description   Final    BLOOD RIGHT HAND Performed at [facility] [address] [place], [place] [address]    Special Requests   Final    BAA BCAV Performed at [facility] [address],  [place], [place] [address]    Culture  Setup Time   Final  GRAM POSITIVE COCCI GRAM NEGATIVE RODS IN BOTH AEROBIC AND ANAEROBIC BOTTLES CRITICAL VALUE NOTED.  VALUE IS CONSISTENT WITH PREVIOUSLY REPORTED AND CALLED VALUE.    Culture (A)  Final    ESCHERICHIA COLI GROUP B STREP(S.AGALACTIAE)ISOLATED SUSCEPTIBILITIES PERFORMED ON PREVIOUS CULTURE WITHIN THE LAST 5 DAYS. Performed at [facility], [place] [address], [place], [place] [address]    Report Status 11/28/2018 FINAL  Final  Culture, blood (routine x 2)     Status: Abnormal   Collection Time: 11/25/18  9:08 AM  Result Value Ref Range Status   Specimen Description   Final    BLOOD LEFT ARM Performed at [facility] [address] [place], [place] [address]    Special Requests   Final    BAA BCAV Performed at [facility] [address], [place], [place] [address]    Culture  Setup Time   Final    GRAM POSITIVE COCCI GRAM NEGATIVE RODS IN BOTH AEROBIC AND ANAEROBIC BOTTLES CRITICAL RESULT CALLED TO, READ BACK BY AND VERIFIED WITH: E GREEN PHARMD 11/26/18 0025 JDW Performed at [facility], [place] [address], [place], [place] [address]    Culture (A)  Final    ESCHERICHIA COLI GROUP B STREP(S.AGALACTIAE)ISOLATED    Report Status 11/28/2018 FINAL  Final   Organism ID, Bacteria ESCHERICHIA COLI  Final   Organism ID, Bacteria GROUP B STREP(S.AGALACTIAE)ISOLATED  Final      Susceptibility   Escherichia coli - MIC*    AMPICILLIN >=32 RESISTANT Resistant     CEFAZOLIN <=4 SENSITIVE Sensitive     CEFEPIME <=1 SENSITIVE Sensitive     CEFTAZIDIME <=1 SENSITIVE Sensitive     CEFTRIAXONE <=1 SENSITIVE Sensitive     CIPROFLOXACIN >=4 RESISTANT Resistant     GENTAMICIN <=1 SENSITIVE Sensitive     IMIPENEM <=0.25 SENSITIVE Sensitive     TRIMETH/SULFA <=20 SENSITIVE Sensitive     AMPICILLIN/SULBACTAM >=32 RESISTANT Resistant     PIP/TAZO <=4 SENSITIVE Sensitive     Extended ESBL  NEGATIVE Sensitive     * ESCHERICHIA COLI   Group b strep(s.agalactiae)isolated - MIC*    CLINDAMYCIN >=1 RESISTANT Resistant     AMPICILLIN <=0.25 SENSITIVE Sensitive     ERYTHROMYCIN >=8 RESISTANT Resistant     VANCOMYCIN 0.5 SENSITIVE Sensitive     CEFTRIAXONE <=0.12 SENSITIVE Sensitive     LEVOFLOXACIN 1 SENSITIVE Sensitive     PENICILLIN SENSITIVE Sensitive     * GROUP B STREP(S.AGALACTIAE)ISOLATED  Blood Culture ID Panel (Reflexed)     Status: Abnormal   Collection Time: 11/25/18  9:08 AM  Result Value Ref Range Status   Enterococcus species NOT DETECTED NOT DETECTED Final   Listeria monocytogenes NOT DETECTED NOT DETECTED Final   Staphylococcus species NOT DETECTED NOT DETECTED Final   Staphylococcus aureus (BCID) NOT DETECTED NOT DETECTED Final   Streptococcus species DETECTED (A) NOT DETECTED Final    Comment: CRITICAL RESULT CALLED TO, READ BACK BY AND VERIFIED WITH: E GREEN PHARMD 11/26/18 0025 JDW    Streptococcus agalactiae DETECTED (A) NOT DETECTED Final    Comment: CRITICAL RESULT CALLED TO, READ BACK BY AND VERIFIED WITH: E GREEN PHARMD 11/26/18 0025 JDW    Streptococcus pneumoniae NOT DETECTED NOT DETECTED Final   Streptococcus pyogenes NOT DETECTED NOT DETECTED Final   Acinetobacter baumannii NOT DETECTED NOT DETECTED Final   Enterobacteriaceae species DETECTED (A) NOT DETECTED Final    Comment: Enterobacteriaceae represent a large family of  gram-negative bacteria, not a single organism. CRITICAL RESULT CALLED TO, READ BACK BY AND VERIFIED WITH: E GREEN PHARMD 11/26/18 0025 JDW    Enterobacter cloacae complex NOT DETECTED NOT DETECTED Final   Escherichia coli DETECTED (A) NOT DETECTED Final    Comment: CRITICAL RESULT CALLED TO, READ BACK BY AND VERIFIED WITH: E GREEN PHARMD 11/26/18 0025 JDW    Klebsiella oxytoca NOT DETECTED NOT DETECTED Final   Klebsiella pneumoniae NOT DETECTED NOT DETECTED Final   Proteus species NOT DETECTED NOT DETECTED Final   Serratia  marcescens NOT DETECTED NOT DETECTED Final   Carbapenem resistance NOT DETECTED NOT DETECTED Final   Haemophilus influenzae NOT DETECTED NOT DETECTED Final   Neisseria meningitidis NOT DETECTED NOT DETECTED Final   Pseudomonas aeruginosa NOT DETECTED NOT DETECTED Final   Candida albicans NOT DETECTED NOT DETECTED Final   Candida glabrata NOT DETECTED NOT DETECTED Final   Candida krusei NOT DETECTED NOT DETECTED Final   Candida parapsilosis NOT DETECTED NOT DETECTED Final   Candida tropicalis NOT DETECTED NOT DETECTED Final    Comment: Performed at [facility], [place] [address] [place], [place] [address]  C difficile quick scan w PCR reflex     Status: Abnormal   Collection Time: 11/27/18  8:00 AM  Result Value Ref Range Status   C Diff antigen POSITIVE (A) NEGATIVE Final   C Diff toxin NEGATIVE NEGATIVE Final   C Diff interpretation Results are indeterminate. See PCR results.  Final    Comment: Performed at [facility], [place] [address], [place], [place] [address]  C. Diff by PCR, Reflexed     Status: Abnormal   Collection Time: 11/27/18  8:00 AM  Result Value Ref Range Status   Toxigenic C. Difficile by PCR POSITIVE (A) NEGATIVE Final    Comment: Positive for toxigenic C. difficile with little to no toxin production. Only treat if clinical presentation suggests symptomatic illness. Performed at [facility], [place] [address] [place], [place] [address]     Studies/Results: Ct Abdomen Pelvis W Contrast  Result Date: 11/27/2018 CLINICAL DATA:  Inpatient. Non-Hodgkin lymphoma. Ongoing chemotherapy. Abdominal pain. Rising bilirubin. EXAM: CT ABDOMEN AND PELVIS WITH CONTRAST TECHNIQUE: Multidetector CT imaging of the abdomen and pelvis was performed using the standard protocol following bolus administration of intravenous contrast. CONTRAST:  112mL OMNIPAQUE IOHEXOL 300 MG/ML  SOLN COMPARISON:  11/18/2018 CT chest, abdomen and pelvis. FINDINGS: Lower chest: Small  dependent bilateral pleural effusions with passive dependent bibasilar atelectasis, unchanged. Small pericardial effusion appears stable. Hepatobiliary: Normal liver size. No liver mass. Normal gallbladder with no radiopaque cholelithiasis. No biliary ductal dilatation. Pancreas: Normal, with no mass or duct dilation. Spleen: Spleen has decreased and is now normal in size. Craniocaudal splenic length 11.4 cm, previously 13.3 cm. No splenic mass. Adrenals/Urinary Tract: Normal adrenals. Mild bilateral hydroureteronephrosis to the level of the ureterovesical junction. Symmetric mildly delayed contrast nephrograms bilaterally. New vague small low-attenuation 1.6 cm renal cortical focus in the lateral upper left kidney (series 7/image 12). Otherwise no renal lesions. Markedly distended and otherwise normal bladder. Stomach/Bowel: Normal non-distended stomach. Normal caliber small bowel with no small bowel wall thickening. Normal appendix. Normal large bowel with no diverticulosis, large bowel wall thickening or pericolonic fat stranding. Vascular/Lymphatic: Atherosclerotic nonaneurysmal abdominal aorta. Patent portal, splenic, hepatic and renal veins. Left inguinal lymphadenopathy is decreased, for example previously 1.2 cm, now 0.9 cm (series 2/image 84). Retroperitoneal adenopathy in aortocaval and left para-aortic chains has decreased. Representative  0.8 cm aortocaval node (series 2/image 31), previously 1.2 cm. Representative 0.8 cm left para-aortic node (series 2/image 30), previously 1.2 cm. No pathologically enlarged abdominopelvic nodes. Reproductive: Grossly normal uterus.  No adnexal mass. Other: Trace free fluid in pelvic cul-de-sac. No focal fluid collection. No pneumoperitoneum. Mild anasarca, similar. Musculoskeletal: No aggressive appearing focal osseous lesions. Mild lumbar spondylosis. IMPRESSION: 1. Acute urinary retention with massively distended urinary bladder and mild bilateral  hydroureteronephrosis. Recommend urgent bladder catheterization. 2. Evidence of treatment response. Spleen is decreased and now normal in size. Retroperitoneal and left inguinal lymphadenopathy is decreased, with no residual pathologically enlarged abdominopelvic nodes. 3. Small dependent bilateral pleural effusions with passive dependent bibasilar atelectasis, stable. 4. Stable small pericardial effusion. 5. Aortic Atherosclerosis (ICD10-I70.0). These results were called by telephone at the time of interpretation on 11/27/2018 at 11:07 am to [place], who verbally acknowledged these results. Per the RN, a bladder catheterization was just completed with 2075 cc of urine drained. Electronically Signed   By: Ilona Sorrel M.D.   On: 11/27/2018 11:31     Assessment/Plan: Hodgkin Lymphoma Pancytopenia Port placed on 2-24  CTX- AVD + Adcedris, granix (2-25). E coli and S agalactiae (4/4) bacteremia C diff thrush Protein Calorie Malnutrition Hypocalcemia, Hypokalemia  Total days of antibiotics: 4 ceftriaxone/flucon. Day 2 po vanco  Will continue to watch.  Neutropenia slightly better.  Repeat BCx are ngtd.  Watch her rash...          Bobby Rumpf MD, FACP Infectious Diseases (pager) [phone] [email or web address] 11/29/2018, 9:56 AM  LOS: 18 days

## 2018-11-29 NOTE — Progress Notes (Signed)
Physical Therapy Treatment Patient Details Name: Cindy Ward MRN: [id number] DOB: Jun 06, 1966 Today's Date: 11/29/2018    History of Present Illness 53 yo female admitted to ED on 11/11/18 for 9 month history of N/V, fatigue, and weight loss. Pt previously followed in [place], oncology reports inconclusive findings. 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 Pt with R iliac BM aspiration and core, awaiting results. Other PMH includes HTN, hypothyroidism.  Course complicated by sepsis and trasnferred to ICU on 11/25/18.    PT Comments    Patient's HR up to 136 with minimal mobility of OOB to recliner. Patient is much discomfort of dry mouth/NPO. Patient is very weak. Continue PT efforts for increasing mobility.   Follow Up Recommendations  Supervision for mobility/OOB;SNF     Equipment Recommendations  None recommended by PT    Recommendations for Other Services       Precautions / Restrictions Precautions Precautions: Fall Precaution Comments: monitor HR-high Restrictions Weight Bearing Restrictions: No LUE Weight Bearing: (no lifting >10#)    Mobility  Bed Mobility Overal bed mobility: Needs Assistance Bed Mobility: Supine to Sit     Supine to sit: Min guard     General bed mobility comments: extra time, patient self assisted legs and trunk  Transfers Overall transfer level: Needs assistance Equipment used: 1 person hand held assist Transfers: Sit to/from [facility] Sit to Stand: Min assist;+2 safety/equipment Stand pivot transfers: Min assist;+2 safety/equipment       General transfer comment: patient reported"I can get Up". Patient did stand and take steps to recliner with 1 HHA,  Ambulation/Gait             General Gait Details: NT, waiting for test. HR 136 with min activity.   Stairs             Wheelchair Mobility    Modified Rankin (Stroke Patients Only)        Balance                                            Cognition Arousal/Alertness: Awake/alert Behavior During Therapy: Flat affect                           Following Commands: Follows one step commands with increased time Safety/Judgement: Decreased awareness of deficits   Problem Solving: Slow processing        Exercises      General Comments        Pertinent Vitals/Pain Pain Location: lips and mouth are so dry-NPO Pain Descriptors / Indicators: Discomfort Pain Intervention(s): Monitored during session    Home Living                      Prior Function            PT Goals (current goals can now be found in the care plan section) Progress towards PT goals: Progressing toward goals(slowly)    Frequency    Min 2X/week      PT Plan Current plan remains appropriate    Co-evaluation              AM-PAC PT "6 Clicks" Mobility   Outcome Measure  Help needed turning from your back to your side while in a flat bed without  using bedrails?: A Little Help needed moving from lying on your back to sitting on the side of a flat bed without using bedrails?: A Little Help needed moving to and from a bed to a chair (including a wheelchair)?: A Little Help needed standing up from a chair using your arms (e.g., wheelchair or bedside chair)?: A Lot Help needed to walk in hospital room?: Total Help needed climbing 3-5 steps with a railing? : Total 6 Click Score: 13    End of Session   Activity Tolerance: Patient limited by fatigue;Treatment limited secondary to medical complications (Comment) Patient left: in chair;with call bell/phone within reach;with chair alarm set Nurse Communication: Mobility status PT Visit Diagnosis: Other abnormalities of gait and mobility (R26.89);Difficulty in walking, not elsewhere classified (R26.2);Muscle weakness (generalized) (M62.81)     Time: 3557-3220 PT Time Calculation (min) (ACUTE ONLY):  18 min  Charges:  $Therapeutic Activity: 8-22 mins                     Tresa Endo PT Acute Rehabilitation Services Pager [phone] Office [phone]    Claretha Cooper 11/29/2018, 10:35 AM

## 2018-11-29 NOTE — Progress Notes (Signed)
[place] INPATIENT PROGRESS NOTE  SUBJECTIVE: Cindy Ward 53 y.o. female with Stage IV Hodgkin lymphoma. Received Day 1 Cycle 1 AVD+ Adcedrison 11/19/2018.  Afebrile over the past 24 hours. Blood cultures drawn on 11/25/2018 positive for E Coli and Group B Strep (S. Agalactiae) in 4/4 bottles.  Repeat blood cultures drawn on 11/28/2018 are negative to date.  On Ceftriaxone. Remains on fluconazole and Nystatin for thrush.  Having pain and difficulty swallowing, but reports that this is improving.  Denies chest pain and shortness of breath. Has abdominal discomfort in the left lower quadrant of her abdomen. On oral vancomycin for C. difficile.  Nursing reports 2 episodes of diarrhea overnight - very small amounts. Denies bleeding.  ALLERGIES:  has No Known Allergies.  MEDICATIONS:  Current Facility-Administered Medications  Medication Dose Route Frequency Provider Last Rate Last Dose  . 0.9 %  sodium chloride infusion   Intravenous PRN Rai, Ripudeep K, MD 10 mL/hr at 11/29/18 0537 500 mL at 11/29/18 0537  . 0.9 %  sodium chloride infusion   Intravenous Once Rai, Ripudeep K, MD      . acetaminophen (TYLENOL) tablet 650 mg  650 mg Oral Q6H PRN Rai, Ripudeep K, MD   650 mg at 11/25/18 0811   Or  . acetaminophen (TYLENOL) suppository 650 mg  650 mg Rectal Q6H PRN Rai, Ripudeep K, MD   650 mg at 11/25/18 1509  . calcium gluconate 1 g/ 50 mL sodium chloride IVPB  1 g Intravenous Once Amin, Ankit Chirag, MD      . calcium gluconate 2 g/ 100 mL sodium chloride IVPB  2 g Intravenous Once Schorr, Rhetta Mura, NP      . cefTRIAXone (ROCEPHIN) 2 g in sodium chloride 0.9 % 100 mL IVPB  2 g Intravenous QHS Dorrene German, [facility]   Stopped at 11/28/18 2202  . Chlorhexidine Gluconate Cloth 2 % PADS 6 each  6 each Topical Daily Rai, Ripudeep K, MD   6 each at 11/28/18 1434  . feeding supplement (BOOST / RESOURCE BREEZE) liquid 1 Container  1 Container Oral Daily PRN Rai, Ripudeep K, MD      .  fluconazole (DIFLUCAN) IVPB 200 mg  200 mg Intravenous Q24H Rai, Ripudeep K, MD   Stopped at 11/28/18 1335  . HYDROmorphone (DILAUDID) injection 0.5-1 mg  0.5-1 mg Intravenous Q4H PRN Rai, Ripudeep K, MD   1 mg at 11/26/18 1058  . ibuprofen (ADVIL,MOTRIN) tablet 400 mg  400 mg Oral Q8H PRN Rai, Ripudeep K, MD   400 mg at 11/18/18 2334  . iohexol (OMNIPAQUE) 300 MG/ML solution 15 mL  15 mL Oral Once PRN Rai, Ripudeep K, MD   30 mL at 11/18/18 1740  . levothyroxine (SYNTHROID, LEVOTHROID) injection 37.5 mcg  37.5 mcg Intravenous Daily Rai, Ripudeep K, MD   37.5 mcg at 11/28/18 0750  . lip balm (CARMEX) ointment   Topical PRN Rai, Ripudeep K, MD      . MEDLINE mouth rinse  15 mL Mouth Rinse BID Rai, Ripudeep K, MD   15 mL at 11/27/18 2215  . multivitamin with minerals tablet 1 tablet  1 tablet Oral Daily Amin, Ankit Chirag, MD   1 tablet at 11/28/18 1433  . nebivolol (BYSTOLIC) tablet 10 mg  10 mg Oral Daily Amin, Ankit Chirag, MD      . nystatin (MYCOSTATIN) 100000 UNIT/ML suspension 500,000 Units  5 mL Oral QID Rai, Ripudeep K, MD   500,000 Units at  11/28/18 2129  . ondansetron (ZOFRAN) tablet 4 mg  4 mg Oral Q6H PRN Rai, Ripudeep K, MD       Or  . ondansetron (ZOFRAN) injection 4 mg  4 mg Intravenous Q6H PRN Rai, Ripudeep K, MD   4 mg at 11/28/18 1740  . oxyCODONE (Oxy IR/ROXICODONE) immediate release tablet 5-10 mg  5-10 mg Oral Q4H PRN Rai, Ripudeep K, MD   5 mg at 11/28/18 1739  . phenol (CHLORASEPTIC) mouth spray 1 spray  1 spray Mouth/Throat PRN Rai, Ripudeep K, MD      . potassium chloride 10 mEq in 100 mL IVPB  10 mEq Intravenous Q1 Hr x 3 Amin, Ankit Chirag, MD      . potassium PHOSPHATE 30 mmol in dextrose 5 % 500 mL infusion  30 mmol Intravenous Once Amin, Ankit Chirag, MD      . protein supplement (UNJURY CHICKEN SOUP) powder 8 oz  8 oz Oral BID PRN Rai, Ripudeep K, MD      . scopolamine (TRANSDERM-SCOP) 1 MG/3DAYS 1.5 mg  1 patch Transdermal Q72H Rai, Ripudeep K, MD   1.5 mg at 11/27/18  1406  . sodium chloride flush (NS) 0.9 % injection 10-40 mL  10-40 mL Intracatheter PRN Rai, Ripudeep K, MD      . Tbo-Filgrastim [facility]) injection 480 mcg  480 mcg Subcutaneous q1800 Tish Men, MD   480 mcg at 11/28/18 1725  . vancomycin (VANCOCIN) 50 mg/mL oral solution 125 mg  125 mg Oral QID Maryanna Shape, NP   125 mg at 11/28/18 2129    REVIEW OF SYSTEMS:   Review of Systems  Review of Systems  Constitutional: Positive for malaise/fatigue and weight loss. Negative for chills and fever.  HENT: Positive for sore throat.        Mucositis and oral thrush.  Eyes: Negative.   Respiratory: Negative.   Cardiovascular: Negative for chest pain.  Gastrointestinal: Positive for abdominal pain and diarrhea. Negative for nausea and vomiting.       Reports a small amount of bleeding per rectum.  Genitourinary: Negative.   Musculoskeletal: Negative.   Skin: Negative.   Neurological: Negative.   Endo/Heme/Allergies:       Bruises easily.    Psychiatric/Behavioral: Negative.       PHYSICAL EXAMINATION:  Vital signs in last 24 hours: Temp:  [98.1 F (36.7 C)-99 F (37.2 C)] 99 F (37.2 C) (03/06 0424) Pulse Rate:  [88-128] 117 (03/06 0900) Resp:  [19-30] 25 (03/06 0900) BP: (98-126)/(51-83) 98/51 (03/06 0900) SpO2:  [91 %-99 %] 95 % (03/06 0900) Weight:  [154 lb 12.2 oz (70.2 kg)] 154 lb 12.2 oz (70.2 kg) (03/06 0500) Weight change: 9 lb 4.2 oz (4.2 kg) Last BM Date: 11/29/18  Intake/Output from previous day: 03/05 0701 - 03/06 0700 In: 1098 [P.O.:60; I.V.:110; Blood:228; IV Piggyback:700] Out: 1800 [Urine:1800] General: Alert, awake without distress.  More talkative this morning.  Chronically ill appearing.  Head: Normocephalic atraumatic. Mouth: mucus membranes dry with mucositis and oral thrush noted.  Eyes: No scleral icterus.  Pupils are equal and round reactive to light. Resp: clear to auscultation bilaterally without rhonchi or wheezes or dullness to percussion. Cardio:  Tachycardic. Regular rhythm, S1, S2 normal, no murmur, click, rub or gallop.  Trace bilateral pedal edema.  GI: soft, mild tenderness with palpation in the left lower quadreant.  bowel sounds normal; no masses,  no organomegaly Musculoskeletal: No joint deformity or effusion. Neurological: No motor, sensory deficits.  Intact deep tendon reflexes. Skin: No rashes or lesions.  Portacath/PICC-without erythema  LABORATORY DATA: Lab Results  Component Value Date   WBC 0.4 (LL) 11/29/2018   HGB 8.0 (L) 11/29/2018   HCT 25.8 (L) 11/29/2018   MCV 89.6 11/29/2018   PLT 38 (L) 11/29/2018    CMP Latest Ref Rng & Units 11/29/2018 11/28/2018 11/28/2018  Glucose 70 - 99 mg/dL 159(H) 147(H) 141(H)  BUN 6 - 20 mg/dL '9 9 7  ' Creatinine 0.44 - 1.00 mg/dL 0.61 0.57 0.58  Sodium 135 - 145 mmol/L 143 145 144  Potassium 3.5 - 5.1 mmol/L 3.5 3.4(L) 3.3(L)  Chloride 98 - 111 mmol/L 109 112(H) 110  CO2 22 - 32 mmol/L '25 24 25  ' Calcium 8.9 - 10.3 mg/dL 5.6(LL) 5.1(LL) 4.5(LL)  Total Protein 6.5 - 8.1 g/dL 5.1(L) 5.1(L) 5.0(L)  Total Bilirubin 0.3 - 1.2 mg/dL 5.2(H) 5.1(H) 4.3(H)  Alkaline Phos 38 - 126 U/L 106 109 110  AST 15 - 41 U/L 33 36 41  ALT 0 - 44 U/L 59(H) 60(H) 60(H)     RADIOGRAPHIC STUDIES:  Dg Chest 1 View  Result Date: 11/26/2018 CLINICAL DATA:  Tachypnea.  Stage IV Hodgkin's lymphoma. EXAM: CHEST  1 VIEW COMPARISON:  Chest radiograph November 25, 2018 FINDINGS: Cardiac silhouette is mildly enlarged and unchanged. Pulmonary vascular congestion with small LEFT pleural effusion. No pneumothorax. Strandy densities LEFT lung base. Surgical clips LEFT axilla. RIGHT single-lumen chest Port-A-Cath distal tip projecting mid superior vena cava. Osseous structures are unchanged. IMPRESSION: Stable cardiomegaly and pulmonary vascular congestion. Small LEFT pleural effusion and LEFT lung base atelectasis. Electronically Signed   By: Elon Alas M.D.   On: 11/26/2018 20:32   Dg Chest 2 View  Result Date:  11/21/2018 CLINICAL DATA:  History of lymphoma.  Patient weak and flushed. EXAM: CHEST - 2 VIEW COMPARISON:  Chest CT, 11/18/2018.  Chest radiographs, 11/11/2018. FINDINGS: Mild enlargement of the cardiopericardial silhouette. No mediastinal or hilar masses. No convincing adenopathy. Small bilateral pleural effusions. Mild dependent lower lobe atelectasis. Lungs otherwise clear. No pneumothorax. Right anterior chest wall, internal jugular, Port-A-Cath is stable from the prior chest CT. Skeletal structures are unremarkable. IMPRESSION: 1. Findings are similar to the recent prior chest CT. Enlargement of the cardiopericardial silhouette is consistent with the pericardial effusion noted on CT. There are small bilateral pleural effusions with associated lower lobe atelectasis. No convincing pneumonia and no pulmonary edema. Electronically Signed   By: Lajean Manes M.D.   On: 11/21/2018 15:05   Ct Head Wo Contrast  Result Date: 11/25/2018 CLINICAL DATA:  Hodgkin's lymphoma, fevers, headache EXAM: CT HEAD WITHOUT CONTRAST TECHNIQUE: Contiguous axial images were obtained from the base of the skull through the vertex without intravenous contrast. COMPARISON:  11/25/2018 FINDINGS: Brain: No evidence of acute infarction, hemorrhage, hydrocephalus, extra-axial collection or mass lesion/mass effect. Mild periventricular white matter hypodensity. Vascular: No hyperdense vessel or unexpected calcification. Skull: Normal. Negative for fracture or focal lesion. Sinuses/Orbits: No acute finding. Other: None. IMPRESSION: No acute intracranial pathology. No non-contrast CT findings to explain headache. Mild small-vessel white matter disease. Electronically Signed   By: Eddie Candle M.D.   On: 11/25/2018 12:37   Ct Soft Tissue Neck W Contrast  Result Date: 11/18/2018 CLINICAL DATA:  Initial evaluation for Hodgkin's lymphoma, initial workup. EXAM: CT NECK WITH CONTRAST CT CHEST, ABDOMEN, AND PELVIS WITH CONTRAST TECHNIQUE:  Multidetector CT imaging of the chest, abdomen and pelvis was performed following the standard protocol during bolus administration of  intravenous contrast. CONTRAST:  167m OMNIPAQUE IOHEXOL 300 MG/ML SOLN, 362mOMNIPAQUE IOHEXOL 300 MG/ML SOLN COMPARISON:  None. FINDINGS: CT NECK FINDINGS: Pharynx and larynx: Oral cavity within normal limits without mass lesion or loculated collection. Patient is largely edentulous. Calcified tonsilliths noted within the right palatine tonsil. Tonsils themselves symmetric and within normal limits bilaterally. Parapharyngeal fat maintained. Nasopharynx normal. No retropharyngeal collection. Epiglottis normal. Vallecula clear. Remainder of the hypopharynx and supraglottic larynx normal. Glottis within normal limits. Subglottic airway clear. Salivary glands: Parotid and submandibular glands within normal limits. Thyroid: Thyroid diffusely enlarged without discrete nodule or mass. Lymph nodes: Mildly prominent level II lymph nodes measure up to 9 mm in short axis bilaterally. Left level III nodes measure up to 9 mm as well. Increased number of shotty subcentimeter nodes within the neck bilaterally, left slightly worse than right. No other pathologically enlarged lymph nodes identified within the neck. Vascular: Normal intravascular enhancement seen throughout the neck. Right IJ approach central venous catheter in place. Soft tissue swelling with stranding and scattered foci of soft tissue emphysema within the right neck likely related to central line placement. Limited intracranial: Unremarkable Visualized orbits: Partially visualized inferior globes and orbits unremarkable. Mastoids and visualized paranasal sinuses: Mucosal thickening noted within the visualized maxillary and sphenoid sinuses. Visualized paranasal sinuses are otherwise clear. Visualize mastoids and middle ear cavities are clear. Skeleton: No acute osseous abnormality. No discrete lytic or blastic osseous lesions.  Mild cervical spondylolysis present at C5-6. Other: None. CT CHEST FINDINGS Cardiovascular: Normal intravascular enhancement seen throughout the intra-abdominal aorta without aneurysm or other acute abnormality. Minimal atheromatous plaque within the aortic arch. Visualized great vessels within normal limits. Heart size normal. Small moderate pericardial effusion, simple fluid density. Mediastinum/Nodes: Enlarged nodes within the upper mediastinum measure up to 15 mm in short axis (series 3, image 11). No appreciable supraclavicular adenopathy. No other pathologically enlarged mediastinal lymph nodes. Mild soft tissue density within the right hilum without frank adenopathy. Mildly enlarged 11 mm left hilar node (series 3, image 28). Enlarged axillary adenopathy seen bilaterally, with the largest node on the left measuring 2.1 cm in short axis (series 3, image 16). Largest node on the right measures 1.7 cm in short axis (series 3, image 8). Scattered soft tissue stranding with emphysema and fluid density overlying the left axilla likely related to recent lymph node biopsy, partially visualized. Esophagus within normal limits. Lungs/Pleura: Tracheobronchial tree intact and patent. Moderate layering bilateral pleural effusions with associated atelectasis. No other airspace consolidation. No pulmonary edema. No pneumothorax. 4 mm subpleural ground-glass nodule present at the anterior right upper lobe (series 4, image 57), indeterminate. No other worrisome pulmonary nodule or mass. Musculoskeletal: No acute osseous finding. No discrete lytic or blastic osseous lesions. CT ABDOMEN PELVIS FINDINGS Hepatobiliary: Liver demonstrates a normal contrast enhanced appearance. Gallbladder within normal limits. No biliary dilatation. Pancreas: Pancreas within normal limits. Spleen: Spleen enlarged measuring 14.3 cm in craniocaudad dimension. Few subtle hypodensities noted within the spleen, largest of which measures approximately  12 mm (series 3, image 57), indeterminate. Adrenals/Urinary Tract: Adrenal glands are normal. Kidneys equal in size with symmetric enhancement. No nephrolithiasis, hydronephrosis, or focal enhancing renal mass. No hydroureter. Bladder within normal limits. Stomach/Bowel: Stomach within normal limits. No evidence for bowel obstruction. Normal appendix. No acute inflammatory changes seen about the bowels. Vascular/Lymphatic: Normal intravascular enhancement seen throughout the intra-abdominal aorta. Mild aorto bi-iliac atherosclerotic disease. No aneurysm. Mesenteric vessels patent proximally. Enlarged 15 mm aortocaval lymph node (series 3, image 7).  Additional multifocal shotty subcentimeter aortocaval and periaortic lymph nodes noted. No other pathologically enlarged intra-abdominal lymph nodes identified. Mildly prominent 12 mm left inguinal lymph nodes noted. No other adenopathy within the pelvis. Reproductive: Uterus within normal limits.  Ovaries normal. Other: Moderate volume free fluid within the pelvis, measuring simple fluid density. No free intraperitoneal air. Musculoskeletal: Scattered anasarca noted within the external soft tissues. No acute osseous finding. No discrete lytic or blastic osseous lesions. IMPRESSION: CT NECK IMPRESSION 1. Increased number of shoddy subcentimeter lymph nodes throughout the neck bilaterally, left slightly worse than right. No pathologically enlarged lymph nodes identified. 2. Mild diffuse thyromegaly without discrete thyroid nodule or mass. 3. Mild soft tissue stranding with emphysema within the right lateral neck related to recent right-sided Port-A-Cath placement. CT CHEST IMPRESSION 1. Enlarged bilateral axillary and upper mediastinal adenopathy as above, likely related to provided history of lymphoma. Additional borderline enlarged left hilar node may be related to lymphoma or possibly reactive in nature. Attention at follow-up recommended. 2. Moderate layering bilateral  pleural effusions with associated atelectasis. 3. Small to moderate pericardial effusion. 4. 4 mm right upper lobe ground-glass nodule, indeterminate. Attention at follow-up recommended. CT ABDOMEN AND PELVIS IMPRESSION 1. Enlarged 15 mm aortocaval lymph node with mildly enlarged 12 mm left inguinal lymph nodes. Additional increased number of shotty subcentimeter retroperitoneal lymph nodes. Findings most likely related to history of lymphoma. 2. Splenomegaly. 3. Moderate volume free fluid within the pelvis, of uncertain etiology, but could be physiologic and/or related overall volume status. 4. Mild diffuse anasarca. Electronically Signed   By: Jeannine Boga M.D.   On: 11/18/2018 19:56   Ct Chest W Contrast  Result Date: 11/18/2018 CLINICAL DATA:  Initial evaluation for Hodgkin's lymphoma, initial workup. EXAM: CT NECK WITH CONTRAST CT CHEST, ABDOMEN, AND PELVIS WITH CONTRAST TECHNIQUE: Multidetector CT imaging of the chest, abdomen and pelvis was performed following the standard protocol during bolus administration of intravenous contrast. CONTRAST:  14m OMNIPAQUE IOHEXOL 300 MG/ML SOLN, 399mOMNIPAQUE IOHEXOL 300 MG/ML SOLN COMPARISON:  None. FINDINGS: CT NECK FINDINGS: Pharynx and larynx: Oral cavity within normal limits without mass lesion or loculated collection. Patient is largely edentulous. Calcified tonsilliths noted within the right palatine tonsil. Tonsils themselves symmetric and within normal limits bilaterally. Parapharyngeal fat maintained. Nasopharynx normal. No retropharyngeal collection. Epiglottis normal. Vallecula clear. Remainder of the hypopharynx and supraglottic larynx normal. Glottis within normal limits. Subglottic airway clear. Salivary glands: Parotid and submandibular glands within normal limits. Thyroid: Thyroid diffusely enlarged without discrete nodule or mass. Lymph nodes: Mildly prominent level II lymph nodes measure up to 9 mm in short axis bilaterally. Left level III  nodes measure up to 9 mm as well. Increased number of shotty subcentimeter nodes within the neck bilaterally, left slightly worse than right. No other pathologically enlarged lymph nodes identified within the neck. Vascular: Normal intravascular enhancement seen throughout the neck. Right IJ approach central venous catheter in place. Soft tissue swelling with stranding and scattered foci of soft tissue emphysema within the right neck likely related to central line placement. Limited intracranial: Unremarkable Visualized orbits: Partially visualized inferior globes and orbits unremarkable. Mastoids and visualized paranasal sinuses: Mucosal thickening noted within the visualized maxillary and sphenoid sinuses. Visualized paranasal sinuses are otherwise clear. Visualize mastoids and middle ear cavities are clear. Skeleton: No acute osseous abnormality. No discrete lytic or blastic osseous lesions. Mild cervical spondylolysis present at C5-6. Other: None. CT CHEST FINDINGS Cardiovascular: Normal intravascular enhancement seen throughout the intra-abdominal aorta without aneurysm  or other acute abnormality. Minimal atheromatous plaque within the aortic arch. Visualized great vessels within normal limits. Heart size normal. Small moderate pericardial effusion, simple fluid density. Mediastinum/Nodes: Enlarged nodes within the upper mediastinum measure up to 15 mm in short axis (series 3, image 11). No appreciable supraclavicular adenopathy. No other pathologically enlarged mediastinal lymph nodes. Mild soft tissue density within the right hilum without frank adenopathy. Mildly enlarged 11 mm left hilar node (series 3, image 28). Enlarged axillary adenopathy seen bilaterally, with the largest node on the left measuring 2.1 cm in short axis (series 3, image 16). Largest node on the right measures 1.7 cm in short axis (series 3, image 8). Scattered soft tissue stranding with emphysema and fluid density overlying the left  axilla likely related to recent lymph node biopsy, partially visualized. Esophagus within normal limits. Lungs/Pleura: Tracheobronchial tree intact and patent. Moderate layering bilateral pleural effusions with associated atelectasis. No other airspace consolidation. No pulmonary edema. No pneumothorax. 4 mm subpleural ground-glass nodule present at the anterior right upper lobe (series 4, image 57), indeterminate. No other worrisome pulmonary nodule or mass. Musculoskeletal: No acute osseous finding. No discrete lytic or blastic osseous lesions. CT ABDOMEN PELVIS FINDINGS Hepatobiliary: Liver demonstrates a normal contrast enhanced appearance. Gallbladder within normal limits. No biliary dilatation. Pancreas: Pancreas within normal limits. Spleen: Spleen enlarged measuring 14.3 cm in craniocaudad dimension. Few subtle hypodensities noted within the spleen, largest of which measures approximately 12 mm (series 3, image 57), indeterminate. Adrenals/Urinary Tract: Adrenal glands are normal. Kidneys equal in size with symmetric enhancement. No nephrolithiasis, hydronephrosis, or focal enhancing renal mass. No hydroureter. Bladder within normal limits. Stomach/Bowel: Stomach within normal limits. No evidence for bowel obstruction. Normal appendix. No acute inflammatory changes seen about the bowels. Vascular/Lymphatic: Normal intravascular enhancement seen throughout the intra-abdominal aorta. Mild aorto bi-iliac atherosclerotic disease. No aneurysm. Mesenteric vessels patent proximally. Enlarged 15 mm aortocaval lymph node (series 3, image 7). Additional multifocal shotty subcentimeter aortocaval and periaortic lymph nodes noted. No other pathologically enlarged intra-abdominal lymph nodes identified. Mildly prominent 12 mm left inguinal lymph nodes noted. No other adenopathy within the pelvis. Reproductive: Uterus within normal limits.  Ovaries normal. Other: Moderate volume free fluid within the pelvis, measuring  simple fluid density. No free intraperitoneal air. Musculoskeletal: Scattered anasarca noted within the external soft tissues. No acute osseous finding. No discrete lytic or blastic osseous lesions. IMPRESSION: CT NECK IMPRESSION 1. Increased number of shoddy subcentimeter lymph nodes throughout the neck bilaterally, left slightly worse than right. No pathologically enlarged lymph nodes identified. 2. Mild diffuse thyromegaly without discrete thyroid nodule or mass. 3. Mild soft tissue stranding with emphysema within the right lateral neck related to recent right-sided Port-A-Cath placement. CT CHEST IMPRESSION 1. Enlarged bilateral axillary and upper mediastinal adenopathy as above, likely related to provided history of lymphoma. Additional borderline enlarged left hilar node may be related to lymphoma or possibly reactive in nature. Attention at follow-up recommended. 2. Moderate layering bilateral pleural effusions with associated atelectasis. 3. Small to moderate pericardial effusion. 4. 4 mm right upper lobe ground-glass nodule, indeterminate. Attention at follow-up recommended. CT ABDOMEN AND PELVIS IMPRESSION 1. Enlarged 15 mm aortocaval lymph node with mildly enlarged 12 mm left inguinal lymph nodes. Additional increased number of shotty subcentimeter retroperitoneal lymph nodes. Findings most likely related to history of lymphoma. 2. Splenomegaly. 3. Moderate volume free fluid within the pelvis, of uncertain etiology, but could be physiologic and/or related overall volume status. 4. Mild diffuse anasarca. Electronically Signed  By: Jeannine Boga M.D.   On: 11/18/2018 19:56   Ct Abdomen Pelvis W Contrast  Result Date: 11/27/2018 CLINICAL DATA:  Inpatient. Non-Hodgkin lymphoma. Ongoing chemotherapy. Abdominal pain. Rising bilirubin. EXAM: CT ABDOMEN AND PELVIS WITH CONTRAST TECHNIQUE: Multidetector CT imaging of the abdomen and pelvis was performed using the standard protocol following bolus  administration of intravenous contrast. CONTRAST:  165m OMNIPAQUE IOHEXOL 300 MG/ML  SOLN COMPARISON:  11/18/2018 CT chest, abdomen and pelvis. FINDINGS: Lower chest: Small dependent bilateral pleural effusions with passive dependent bibasilar atelectasis, unchanged. Small pericardial effusion appears stable. Hepatobiliary: Normal liver size. No liver mass. Normal gallbladder with no radiopaque cholelithiasis. No biliary ductal dilatation. Pancreas: Normal, with no mass or duct dilation. Spleen: Spleen has decreased and is now normal in size. Craniocaudal splenic length 11.4 cm, previously 13.3 cm. No splenic mass. Adrenals/Urinary Tract: Normal adrenals. Mild bilateral hydroureteronephrosis to the level of the ureterovesical junction. Symmetric mildly delayed contrast nephrograms bilaterally. New vague small low-attenuation 1.6 cm renal cortical focus in the lateral upper left kidney (series 7/image 12). Otherwise no renal lesions. Markedly distended and otherwise normal bladder. Stomach/Bowel: Normal non-distended stomach. Normal caliber small bowel with no small bowel wall thickening. Normal appendix. Normal large bowel with no diverticulosis, large bowel wall thickening or pericolonic fat stranding. Vascular/Lymphatic: Atherosclerotic nonaneurysmal abdominal aorta. Patent portal, splenic, hepatic and renal veins. Left inguinal lymphadenopathy is decreased, for example previously 1.2 cm, now 0.9 cm (series 2/image 84). Retroperitoneal adenopathy in aortocaval and left para-aortic chains has decreased. Representative 0.8 cm aortocaval node (series 2/image 31), previously 1.2 cm. Representative 0.8 cm left para-aortic node (series 2/image 30), previously 1.2 cm. No pathologically enlarged abdominopelvic nodes. Reproductive: Grossly normal uterus.  No adnexal mass. Other: Trace free fluid in pelvic cul-de-sac. No focal fluid collection. No pneumoperitoneum. Mild anasarca, similar. Musculoskeletal: No aggressive  appearing focal osseous lesions. Mild lumbar spondylosis. IMPRESSION: 1. Acute urinary retention with massively distended urinary bladder and mild bilateral hydroureteronephrosis. Recommend urgent bladder catheterization. 2. Evidence of treatment response. Spleen is decreased and now normal in size. Retroperitoneal and left inguinal lymphadenopathy is decreased, with no residual pathologically enlarged abdominopelvic nodes. 3. Small dependent bilateral pleural effusions with passive dependent bibasilar atelectasis, stable. 4. Stable small pericardial effusion. 5. Aortic Atherosclerosis (ICD10-I70.0). These results were called by telephone at the time of interpretation on 11/27/2018 at 11:07 am to [place] who verbally acknowledged these results. Per the RN, a bladder catheterization was just completed with 2075 cc of urine drained. Electronically Signed   By: JIlona SorrelM.D.   On: 11/27/2018 11:31   Ct Abdomen Pelvis W Contrast  Result Date: 11/18/2018 CLINICAL DATA:  Initial evaluation for Hodgkin's lymphoma, initial workup. EXAM: CT NECK WITH CONTRAST CT CHEST, ABDOMEN, AND PELVIS WITH CONTRAST TECHNIQUE: Multidetector CT imaging of the chest, abdomen and pelvis was performed following the standard protocol during bolus administration of intravenous contrast. CONTRAST:  1042mOMNIPAQUE IOHEXOL 300 MG/ML SOLN, 3053mMNIPAQUE IOHEXOL 300 MG/ML SOLN COMPARISON:  None. FINDINGS: CT NECK FINDINGS: Pharynx and larynx: Oral cavity within normal limits without mass lesion or loculated collection. Patient is largely edentulous. Calcified tonsilliths noted within the right palatine tonsil. Tonsils themselves symmetric and within normal limits bilaterally. Parapharyngeal fat maintained. Nasopharynx normal. No retropharyngeal collection. Epiglottis normal. Vallecula clear. Remainder of the hypopharynx and supraglottic larynx normal. Glottis within normal limits. Subglottic airway clear. Salivary glands: Parotid  and submandibular glands within normal limits. Thyroid: Thyroid diffusely enlarged without discrete nodule or mass. Lymph  nodes: Mildly prominent level II lymph nodes measure up to 9 mm in short axis bilaterally. Left level III nodes measure up to 9 mm as well. Increased number of shotty subcentimeter nodes within the neck bilaterally, left slightly worse than right. No other pathologically enlarged lymph nodes identified within the neck. Vascular: Normal intravascular enhancement seen throughout the neck. Right IJ approach central venous catheter in place. Soft tissue swelling with stranding and scattered foci of soft tissue emphysema within the right neck likely related to central line placement. Limited intracranial: Unremarkable Visualized orbits: Partially visualized inferior globes and orbits unremarkable. Mastoids and visualized paranasal sinuses: Mucosal thickening noted within the visualized maxillary and sphenoid sinuses. Visualized paranasal sinuses are otherwise clear. Visualize mastoids and middle ear cavities are clear. Skeleton: No acute osseous abnormality. No discrete lytic or blastic osseous lesions. Mild cervical spondylolysis present at C5-6. Other: None. CT CHEST FINDINGS Cardiovascular: Normal intravascular enhancement seen throughout the intra-abdominal aorta without aneurysm or other acute abnormality. Minimal atheromatous plaque within the aortic arch. Visualized great vessels within normal limits. Heart size normal. Small moderate pericardial effusion, simple fluid density. Mediastinum/Nodes: Enlarged nodes within the upper mediastinum measure up to 15 mm in short axis (series 3, image 11). No appreciable supraclavicular adenopathy. No other pathologically enlarged mediastinal lymph nodes. Mild soft tissue density within the right hilum without frank adenopathy. Mildly enlarged 11 mm left hilar node (series 3, image 28). Enlarged axillary adenopathy seen bilaterally, with the largest node  on the left measuring 2.1 cm in short axis (series 3, image 16). Largest node on the right measures 1.7 cm in short axis (series 3, image 8). Scattered soft tissue stranding with emphysema and fluid density overlying the left axilla likely related to recent lymph node biopsy, partially visualized. Esophagus within normal limits. Lungs/Pleura: Tracheobronchial tree intact and patent. Moderate layering bilateral pleural effusions with associated atelectasis. No other airspace consolidation. No pulmonary edema. No pneumothorax. 4 mm subpleural ground-glass nodule present at the anterior right upper lobe (series 4, image 57), indeterminate. No other worrisome pulmonary nodule or mass. Musculoskeletal: No acute osseous finding. No discrete lytic or blastic osseous lesions. CT ABDOMEN PELVIS FINDINGS Hepatobiliary: Liver demonstrates a normal contrast enhanced appearance. Gallbladder within normal limits. No biliary dilatation. Pancreas: Pancreas within normal limits. Spleen: Spleen enlarged measuring 14.3 cm in craniocaudad dimension. Few subtle hypodensities noted within the spleen, largest of which measures approximately 12 mm (series 3, image 57), indeterminate. Adrenals/Urinary Tract: Adrenal glands are normal. Kidneys equal in size with symmetric enhancement. No nephrolithiasis, hydronephrosis, or focal enhancing renal mass. No hydroureter. Bladder within normal limits. Stomach/Bowel: Stomach within normal limits. No evidence for bowel obstruction. Normal appendix. No acute inflammatory changes seen about the bowels. Vascular/Lymphatic: Normal intravascular enhancement seen throughout the intra-abdominal aorta. Mild aorto bi-iliac atherosclerotic disease. No aneurysm. Mesenteric vessels patent proximally. Enlarged 15 mm aortocaval lymph node (series 3, image 7). Additional multifocal shotty subcentimeter aortocaval and periaortic lymph nodes noted. No other pathologically enlarged intra-abdominal lymph nodes  identified. Mildly prominent 12 mm left inguinal lymph nodes noted. No other adenopathy within the pelvis. Reproductive: Uterus within normal limits.  Ovaries normal. Other: Moderate volume free fluid within the pelvis, measuring simple fluid density. No free intraperitoneal air. Musculoskeletal: Scattered anasarca noted within the external soft tissues. No acute osseous finding. No discrete lytic or blastic osseous lesions. IMPRESSION: CT NECK IMPRESSION 1. Increased number of shoddy subcentimeter lymph nodes throughout the neck bilaterally, left slightly worse than right. No pathologically enlarged lymph nodes  identified. 2. Mild diffuse thyromegaly without discrete thyroid nodule or mass. 3. Mild soft tissue stranding with emphysema within the right lateral neck related to recent right-sided Port-A-Cath placement. CT CHEST IMPRESSION 1. Enlarged bilateral axillary and upper mediastinal adenopathy as above, likely related to provided history of lymphoma. Additional borderline enlarged left hilar node may be related to lymphoma or possibly reactive in nature. Attention at follow-up recommended. 2. Moderate layering bilateral pleural effusions with associated atelectasis. 3. Small to moderate pericardial effusion. 4. 4 mm right upper lobe ground-glass nodule, indeterminate. Attention at follow-up recommended. CT ABDOMEN AND PELVIS IMPRESSION 1. Enlarged 15 mm aortocaval lymph node with mildly enlarged 12 mm left inguinal lymph nodes. Additional increased number of shotty subcentimeter retroperitoneal lymph nodes. Findings most likely related to history of lymphoma. 2. Splenomegaly. 3. Moderate volume free fluid within the pelvis, of uncertain etiology, but could be physiologic and/or related overall volume status. 4. Mild diffuse anasarca. Electronically Signed   By: Jeannine Boga M.D.   On: 11/18/2018 19:56   Ct Biopsy  Result Date: 11/15/2018 INDICATION: Pancytopenia, concern for lymphoproliferative  process EXAM: CT GUIDED RIGHT ILIAC BONE MARROW ASPIRATION AND CORE BIOPSY Date:  11/15/2018 11/15/2018 10:13 am Radiologist:  M. Daryll Brod, MD Guidance:  CT FLUOROSCOPY TIME:  Fluoroscopy Time: None. MEDICATIONS: 1% lidocaine local ANESTHESIA/SEDATION: 2.0 mg IV Versed; 50 mcg IV Fentanyl Moderate Sedation Time:  10 minutes The patient was continuously monitored during the procedure by the interventional radiology nurse under my direct supervision. CONTRAST:  None. COMPLICATIONS: None PROCEDURE: Informed consent was obtained from the patient following explanation of the procedure, risks, benefits and alternatives. The patient understands, agrees and consents for the procedure. All questions were addressed. A time out was performed. The patient was positioned prone and non-contrast localization CT was performed of the pelvis to demonstrate the iliac marrow spaces. Maximal barrier sterile technique utilized including caps, mask, sterile gowns, sterile gloves, large sterile drape, hand hygiene, and Betadine prep. Under sterile conditions and local anesthesia, an 11 gauge coaxial bone biopsy needle was advanced into the right iliac marrow space. Needle position was confirmed with CT imaging. Initially, bone marrow aspiration was performed. Next, the 11 gauge outer cannula was utilized to obtain a right iliac bone marrow core biopsy. Needle was removed. Hemostasis was obtained with compression. The patient tolerated the procedure well. Samples were prepared with the cytotechnologist. No immediate complications. IMPRESSION: CT guided right iliac bone marrow aspiration and core biopsy. Electronically Signed   By: Jerilynn Mages.  Shick M.D.   On: 11/15/2018 11:25   Dg Chest Port 1 View  Result Date: 11/25/2018 CLINICAL DATA:  Dyspnea. EXAM: PORTABLE CHEST 1 VIEW COMPARISON:  November 21, 2018 FINDINGS: The right Port-A-Cath is stable. Small effusion and left basilar opacity remain. Mild pulmonary venous congestion without overt  edema. The cardiomediastinal silhouette is stable with cardiomegaly. IMPRESSION: Mild pulmonary venous congestion. Tiny left effusion with underlying opacity in left base, stable. No other change. Electronically Signed   By: Dorise Bullion III M.D   On: 11/25/2018 13:02   Dg Chest Port 1 View  Result Date: 11/11/2018 CLINICAL DATA:  Fever EXAM: PORTABLE CHEST 1 VIEW COMPARISON:  None. FINDINGS: Mild cardiomegaly. No focal consolidation or effusion. No pneumothorax. IMPRESSION: No active disease. Electronically Signed   By: Donavan Foil M.D.   On: 11/11/2018 21:59   Ct Bone Marrow Biopsy & Aspiration  Result Date: 11/15/2018 INDICATION: Pancytopenia, concern for lymphoproliferative process EXAM: CT GUIDED RIGHT ILIAC BONE MARROW ASPIRATION  AND CORE BIOPSY Date:  11/15/2018 11/15/2018 10:13 am Radiologist:  M. Daryll Brod, MD Guidance:  CT FLUOROSCOPY TIME:  Fluoroscopy Time: None. MEDICATIONS: 1% lidocaine local ANESTHESIA/SEDATION: 2.0 mg IV Versed; 50 mcg IV Fentanyl Moderate Sedation Time:  10 minutes The patient was continuously monitored during the procedure by the interventional radiology nurse under my direct supervision. CONTRAST:  None. COMPLICATIONS: None PROCEDURE: Informed consent was obtained from the patient following explanation of the procedure, risks, benefits and alternatives. The patient understands, agrees and consents for the procedure. All questions were addressed. A time out was performed. The patient was positioned prone and non-contrast localization CT was performed of the pelvis to demonstrate the iliac marrow spaces. Maximal barrier sterile technique utilized including caps, mask, sterile gowns, sterile gloves, large sterile drape, hand hygiene, and Betadine prep. Under sterile conditions and local anesthesia, an 11 gauge coaxial bone biopsy needle was advanced into the right iliac marrow space. Needle position was confirmed with CT imaging. Initially, bone marrow aspiration was  performed. Next, the 11 gauge outer cannula was utilized to obtain a right iliac bone marrow core biopsy. Needle was removed. Hemostasis was obtained with compression. The patient tolerated the procedure well. Samples were prepared with the cytotechnologist. No immediate complications. IMPRESSION: CT guided right iliac bone marrow aspiration and core biopsy. Electronically Signed   By: Jerilynn Mages.  Shick M.D.   On: 11/15/2018 11:25   Ir Imaging Guided Port Insertion  Result Date: 11/18/2018 INDICATION: 53 year old female with history lymphoma EXAM: IMPLANTED PORT A CATH PLACEMENT WITH ULTRASOUND AND FLUOROSCOPIC GUIDANCE MEDICATIONS: 2.0 g Ancef; The antibiotic was administered within an appropriate time interval prior to skin puncture. ANESTHESIA/SEDATION: Moderate (conscious) sedation was employed during this procedure. A total of Versed 2.0 mg and Fentanyl 100 mcg was administered intravenously. Moderate Sedation Time: 18 minutes. The patient's level of consciousness and vital signs were monitored continuously by radiology nursing throughout the procedure under my direct supervision. FLUOROSCOPY TIME:  0 minutes, 12 seconds (1.0 mGy) COMPLICATIONS: None PROCEDURE: The procedure, risks, benefits, and alternatives were explained to the patient. Questions regarding the procedure were encouraged and answered. The patient understands and consents to the procedure. Ultrasound survey was performed with images stored and sent to PACs. The right neck and chest was prepped with chlorhexidine, and draped in the usual sterile fashion using maximum barrier technique (cap and mask, sterile gown, sterile gloves, large sterile sheet, hand hygiene and cutaneous antiseptic). Antibiotic prophylaxis was provided with 2.0g Ancef administered IV one hour prior to skin incision. Local anesthesia was attained by infiltration with 1% lidocaine without epinephrine. Ultrasound demonstrated patency of the right internal jugular vein, and this  was documented with an image. Under real-time ultrasound guidance, this vein was accessed with a 21 gauge micropuncture needle and image documentation was performed. A small dermatotomy was made at the access site with an 11 scalpel. A 0.018" wire was advanced into the SVC and used to estimate the length of the internal catheter. The access needle exchanged for a 20F micropuncture vascular sheath. The 0.018" wire was then removed and a 0.035" wire advanced into the IVC. An appropriate location for the subcutaneous reservoir was selected below the clavicle and an incision was made through the skin and underlying soft tissues. The subcutaneous tissues were then dissected using a combination of blunt and sharp surgical technique and a pocket was formed. A single lumen power injectable portacatheter was then tunneled through the subcutaneous tissues from the pocket to the dermatotomy and the  port reservoir placed within the subcutaneous pocket. The venous access site was then serially dilated and a peel away vascular sheath placed over the wire. The wire was removed and the port catheter advanced into position under fluoroscopic guidance. The catheter tip is positioned in the cavoatrial junction. This was documented with a spot image. The portacatheter was then tested and found to flush and aspirate well. The port was flushed with saline followed by 100 units/mL heparinized saline. The pocket was then closed in two layers using first subdermal inverted interrupted absorbable sutures followed by a running subcuticular suture. The epidermis was then sealed with Dermabond. The dermatotomy at the venous access site was also seal with Dermabond. Patient tolerated the procedure well and remained hemodynamically stable throughout. No complications encountered and no significant blood loss encountered IMPRESSION: Status post right IJ port catheter placement. Catheter ready for use. Signed, Dulcy Fanny. Dellia Nims, RPVI [facility] Electronically Signed   By: Corrie Mckusick D.O.   On: 11/18/2018 11:23     ASSESSMENT/PLAN:  Stage IV Hodgkin lymphoma -Port placed in 10/2018; TTE showed normal LVEF -The patient received day 1 of cycle 1 ofAVD + Adcedris on 11/19/2018.; received for Granix x 6 days so far, starting2/26/2020. Remains neutropenic. Granix will be continued. -Recommend PRN anti-emetics, including Zofran and Compazine -CT scan of the abdomen pelvis showed the intra-abdominal lymph nodes appear to be improving.  Spleen is decreased in size.  Normocytic anemia -Likely secondary to anemia of chronic disease and underlying lymphoma -Hemoglobin is 8.0. No transfusion indicated today. -Supportive transfusion to keep Hgb > 7. Blood products to be irradiated.   Thrombocytopenia -Likely secondary to underlying lymphoma and recent chemotherapy. -Platelets 38,000. Received 1 unit of platelets on 11/28/2018. -Daily CBC -Goal plts >10k in the absence of bleeding. Blood products to be irradiated.    Persistent fever -Secondary to underlying lymphoma and bacteremia.  -Patient has + blood cultures (E Coli and Group B Strep) -Continue Ceftriaxone. -Fungal culture is pending. -Appreciate assistance from infectious disease. -Recommend monitoring the patient to ensure that she is fever-free for at least 24to 48hrs. without Tylenol before discharge.  Severe protein malnutrition -Patient has lost > 20 lbs since the onset of her symptoms, and her most recent albumin was 2.0 with anasarca, consistent with severe protein malnutrition -Dietitian is following. -If her nutrition remains very poor and she is unable to increase her PO intake, we will have to consider enteral nutrition, such as PEG tube, to help improve her nutritional intake  Diarrhea with abdominal discomfort -Stool for c. Diff Positive.  -Continue oral vancomycin. -CT the abdomen pelvis did not  show any concerning findings in the left lower quadrant of her abdomen. -Continue pain meds.  Electrolyte abnormalities -Electrolyte abnormalities are improving this morning.  Electrolytes are being checked twice a day.  Replete per primary team.  Hyperbilirubinemia -Unclear etiology.  CT scan did not show any focal liver abnormality or obstruction. -Discussed with hospitalist.  Abdominal U/S ordered for today.  Urinary retention -Currently has a Foley catheter in place. -Foley will likely be D/C'd later today.   Deconditioning -Working with PT. Up to chair this am. -Will likely need SNF upon D/C.   Patient's daughter, Anderson Malta, [phone], requests periodic updates. Will call later today.    LOS: 18 days   Mikey Bussing, DNP, AGPCNP-BC, AOCNP 11/29/18

## 2018-11-29 NOTE — Progress Notes (Signed)
PROGRESS NOTE    Cindy Ward  CHY:[id number] DOB: 10/15/1965 DOA: 11/11/2018 PCP: [name], MD   Brief Narrative:  53 year old female with history of essential hypertension, hypothyroidism initially presented to [facility] with complaints of recurrent nausea, vomiting, fever and night sweats.  Upon extensive work-up she was diagnosed with Hodgkin's lymphoma, stage IV.  Oncology team was consulted and patient was transferred here for further care and management.  Lymph node biopsy was consistent with classic Hodgkin's lymphoma.  Hospital course has been complicated by bacteremia, intermittent encephalopathy, severe protein calorie malnutrition and electrolyte imbalance.  She is also been pancytopenic since she is on chemotherapy receiving Granix.  Pulmonary and oncology team has been following.   Assessment & Plan:   Principal Problem:   Hodgkin lymphoma ([place]) Active Problems:   Lymphadenopathy   Fever   Splenomegaly   Liver enzyme elevation   Thrombocytopenia (HCC)   Unintentional weight loss   Hashimoto's thyroiditis   Goiter   Hypertension   Aortic atherosclerosis (HCC)   Pancytopenia (HCC)   Malnutrition of moderate degree   Normocytic anemia   Neutropenic fever (HCC)   Oral thrush   Enteritis due to Clostridium difficile   Streptococcal bacteremia   Palliative care by specialist   Goals of care, counseling/discussion  Stage IV Hodgkin's lymphoma, new diagnosis - CT of the chest abdomen pelvis consistent with diffuse lymphadenopathy.  CT of the head is negative for any metastatic disease.  Lymph node biopsy performed 2/18-consistent with Hodgkin's lymphoma.  Port-A-Cath placed 2/24.  Chemotherapy started 2/25 -Onc following.  -Appreciate Palliative care input.   Pancytopenia with mild rectal bleeding overnight.  -Improved after 1U Plt, 38 today. Continue Granix.  Management per oncology.   Abnormal breath sounds - Bronchodilators, incentive spirometry  and flutter valve  Sinus tachycardia -Deconditioning, dehydration.  Will resume her beta-blocker.  Bacteremia with E. coli and group B strep with neutropenia - ID recommends continue Rocephin/fluconazole-day 4.  Oral vancomycin day 2. -Continue to follow blood cultures.  Repeat cx sent on 11/28/18. - Awaiting fungal culture and strep agalactiae data - C. difficile antigen positive, toxin negative.    Elevated Total Bilirubin; possible due to medications.  -CT of the abdomen pelvis is negative for any abnormal pathology in the right upper quadrant.  Still trending up. -Right upper quadrant ultrasound-pending today  Acute metabolic encephalopathy/delirium; improved.  Visual hallucination; resolved.  -CT of the head does not show any evidence of metastases.  May need MRI of the brain.  Most likely this is metabolic in nature.  We will continue to monitor this.  Hypokalemia/hypophosphatemia/hypocalcemia -Aggressive repletion  Acute urinary retention with bilateral hydroureteronephrosis -Plan to remove Foley today.  Voiding trial.  Severe protein calorie malnutrition Mild dysphagia with oral thrush - Encourage p.o. intake.  Nutrition consultation. - Nystatin and Diflucan given.  Hypothyroidism -Continue Synthroid  Generalized debility - PT following-recommending SNF  DVT prophylaxis: SCDs Code Status: Full code Family Communication: None at bedside Disposition Plan: Maintain close monitoring in stepdown unit.  Consultants:   Oncology   PCCM  Procedures:   Bx 2/21  Port cath 2/24  Antimicrobials:  Anti-infectives (From admission, onward)   Start     Dose/Rate Route Frequency Ordered Stop   11/27/18 1130  vancomycin (VANCOCIN) 50 mg/mL oral solution 125 mg     125 mg Oral 4 times daily 11/27/18 1051 12/07/18 0959   11/27/18 0915  metroNIDAZOLE (FLAGYL) IVPB 500 mg  Status:  Discontinued  500 mg 100 mL/hr over 60 Minutes Intravenous Every 8 hours 11/27/18 0912  11/27/18 1051   11/26/18 0200  cefTRIAXone (ROCEPHIN) 2 g in sodium chloride 0.9 % 100 mL IVPB     2 g 200 mL/hr over 30 Minutes Intravenous Daily at bedtime 11/26/18 0059     11/25/18 1200  fluconazole (DIFLUCAN) IVPB 200 mg     200 mg 100 mL/hr over 60 Minutes Intravenous Every 24 hours 11/25/18 0836     11/25/18 1000  vancomycin (VANCOCIN) IVPB 1000 mg/200 mL premix  Status:  Discontinued     1,000 mg 200 mL/hr over 60 Minutes Intravenous Every 12 hours 11/25/18 0848 11/26/18 0827   11/25/18 1000  ceFEPIme (MAXIPIME) 2 g in sodium chloride 0.9 % 100 mL IVPB  Status:  Discontinued     2 g 200 mL/hr over 30 Minutes Intravenous Every 8 hours 11/25/18 0848 11/26/18 0058   11/25/18 0900  vancomycin (VANCOCIN) 1,500 mg in sodium chloride 0.9 % 500 mL IVPB     1,500 mg 250 mL/hr over 120 Minutes Intravenous  Once 11/25/18 0848 11/25/18 1135   11/24/18 1500  fluconazole (DIFLUCAN) tablet 200 mg  Status:  Discontinued     200 mg Oral Daily 11/24/18 1450 11/25/18 0836   11/18/18 0600  ceFAZolin (ANCEF) IVPB 2g/100 mL premix     2 g 200 mL/hr over 30 Minutes Intravenous To Radiology 11/17/18 1243 11/18/18 1130   11/12/18 0800  cefTRIAXone (ROCEPHIN) 2 g in sodium chloride 0.9 % 100 mL IVPB  Status:  Discontinued     2 g 200 mL/hr over 30 Minutes Intravenous Every 24 hours 11/11/18 1711 11/12/18 1057         Subjective: Sitting up in the chair.  Reports of slight abdominal discomfort and wanting to eat but have informed her that she is not allowed to eat until she gets her right upper quadrant ultrasound done.  No other complaints.  Review of Systems Otherwise negative except as per HPI, including: General = no fevers, chills, dizziness, malaise, fatigue HEENT/EYES = negative for pain, redness, loss of vision, double vision, blurred vision, loss of hearing, sore throat, hoarseness, dysphagia Cardiovascular= negative for chest pain, palpitation, murmurs, lower extremity  swelling Respiratory/lungs= negative for shortness of breath, cough, hemoptysis, wheezing, mucus production Gastrointestinal= negative for nausea, vomiting,melena, hematemesis Genitourinary= negative for Dysuria, Hematuria, Change in Urinary Frequency MSK = Negative for arthralgia, myalgias, Back Pain, Joint swelling  Neurology= Negative for headache, seizures, numbness, tingling  Psychiatry= Negative for anxiety, depression, suicidal and homocidal ideation Allergy/Immunology= Medication/Food allergy as listed  Skin= Negative for Rash, lesions, ulcers, itching   Objective: Vitals:   11/29/18 0641 11/29/18 0645 11/29/18 0822 11/29/18 0900  BP:   101/67 (!) 98/51  Pulse: (!) 128 (!) 125 (!) 123 (!) 117  Resp: (!) 21 20 (!) 24 (!) 25  Temp:      TempSrc:      SpO2: 96% 98% 97% 95%  Weight:      Height:        Intake/Output Summary (Last 24 hours) at 11/29/2018 1117 Last data filed at 11/29/2018 0900 Gross per 24 hour  Intake 718 ml  Output 1550 ml  Net -832 ml   Filed Weights   11/24/18 0627 11/28/18 0430 11/29/18 0500  Weight: 74.8 kg 66 kg 70.2 kg    Examination:  Constitutional: NAD, calm, comfortable, frail appearing Eyes: PERRL, lids and conjunctivae normal ENMT: Mucous membranes are moist. Posterior pharynx clear of  any exudate or lesions.Normal dentition.  Neck: normal, supple, no masses, no thyromegaly Respiratory: Bilateral coarse breath sounds Cardiovascular: Regular rate and rhythm, no murmurs / rubs / gallops. No extremity edema. 2+ pedal pulses. No carotid bruits.  Abdomen: no tenderness, no masses palpated. No hepatosplenomegaly. Bowel sounds positive.  Musculoskeletal: no clubbing / cyanosis. No joint deformity upper and lower extremities. Good ROM, no contractures. Normal muscle tone.  Skin: no rashes, lesions, ulcers. No induration Neurologic: CN 2-12 grossly intact. Sensation intact, DTR normal. Strength 4/5 in all 4.  Psychiatric: Normal judgment and  insight. Alert and oriented x 3. Normal mood.  Foley catheter in place Port-A-Cath in place  Data Reviewed:   CBC: Recent Labs  Lab 11/23/18 0610 11/24/18 0600 11/25/18 0554 11/26/18 0619 11/27/18 0500 11/28/18 0440 11/29/18 0500  WBC 1.1* 0.5* 0.2* 0.2* 0.2* 0.3* 0.4*  NEUTROABS 0.9* 0.2*  --   --   --   --   --   HGB 9.2* 9.4* 9.1* 8.2* 8.3* 7.9* 8.0*  HCT 27.7* 29.6* 29.0* 24.7* 25.9* 24.3* 25.8*  MCV 86.3 88.9 89.0 85.2 86.6 87.4 89.6  PLT 26* 28* 19* 18* 12* 11* 38*   Basic Metabolic Panel: Recent Labs  Lab 11/26/18 0619 11/26/18 1722 11/26/18 1900 11/27/18 0500 11/27/18 1600 11/28/18 0440 11/28/18 1736 11/29/18 0500  NA  --  147*  --  147* 147* 144 145 143  K  --  3.3*  --  2.4* 2.8* 3.3* 3.4* 3.5  CL  --  110  --  107 109 110 112* 109  CO2  --  25  --  29 26 25 24 25   GLUCOSE  --  143*  --  186* 162* 141* 147* 159*  BUN  --  <5*  --  6 6 7 9 9   CREATININE  --  0.56  --  0.59 0.55 0.58 0.57 0.61  CALCIUM  --  <4.0*  --  <4.0* 4.0* 4.5* 5.1* 5.6*  MG 1.7 1.6*  --  2.0  --  2.5*  --  2.2  PHOS  --   --  <1.0* 1.8*  --  2.0*  --  1.7*   GFR: Estimated Creatinine Clearance: 82 mL/min (by C-G formula based on SCr of 0.61 mg/dL). Liver Function Tests: Recent Labs  Lab 11/27/18 0500 11/27/18 1600 11/28/18 0440 11/28/18 1736 11/29/18 0500  AST 41 34 41 36 33  ALT 58* 53* 60* 60* 59*  ALKPHOS 122 108 110 109 106  BILITOT 3.8*  4.2* 4.0* 4.3* 5.1* 5.2*  PROT 5.3* 4.9* 5.0* 5.1* 5.1*  ALBUMIN 2.0* 1.7* 1.9* 1.9* 2.0*   No results for input(s): LIPASE, AMYLASE in the last 168 hours. Recent Labs  Lab 11/25/18 1228  AMMONIA 24   Coagulation Profile: No results for input(s): INR, PROTIME in the last 168 hours. Cardiac Enzymes: No results for input(s): CKTOTAL, CKMB, CKMBINDEX, TROPONINI in the last 168 hours. BNP (last 3 results) No results for input(s): PROBNP in the last 8760 hours. HbA1C: No results for input(s): HGBA1C in the last 72  hours. CBG: No results for input(s): GLUCAP in the last 168 hours. Lipid Profile: No results for input(s): CHOL, HDL, LDLCALC, TRIG, CHOLHDL, LDLDIRECT in the last 72 hours. Thyroid Function Tests: No results for input(s): TSH, T4TOTAL, FREET4, T3FREE, THYROIDAB in the last 72 hours. Anemia Panel: No results for input(s): VITAMINB12, FOLATE, FERRITIN, TIBC, IRON, RETICCTPCT in the last 72 hours. Sepsis Labs: No results for input(s): PROCALCITON, LATICACIDVEN in  the last 168 hours.  Recent Results (from the past 240 hour(s))  Culture, blood (routine x 2)     Status: None   Collection Time: 11/21/18  2:51 PM  Result Value Ref Range Status   Specimen Description   Final    BLOOD LEFT ANTECUBITAL Performed at [facility] [address] [place], [place] [address]    Special Requests   Final    BOTTLES DRAWN AEROBIC AND ANAEROBIC Blood Culture adequate volume Performed at [facility] [address], [place], [place] [address]    Culture   Final    NO GROWTH 5 DAYS Performed at [facility], [place] [address] [place], [place] [address]    Report Status 11/26/2018 FINAL  Final  Culture, blood (routine x 2)     Status: None   Collection Time: 11/21/18  2:59 PM  Result Value Ref Range Status   Specimen Description   Final    BLOOD RIGHT ANTECUBITAL Performed at [facility] [address], [place], [place] [address]    Special Requests   Final    BOTTLES DRAWN AEROBIC ONLY Blood Culture adequate volume Performed at [facility] [address], [place], [place] [address]    Culture   Final    NO GROWTH 5 DAYS Performed at [facility], [place] [address], [place], [place] [address]    Report Status 11/26/2018 FINAL  Final  Culture, blood (routine x 2)     Status: Abnormal   Collection Time: 11/25/18  9:08 AM  Result Value Ref Range Status   Specimen Description   Final    BLOOD RIGHT  HAND Performed at [facility] [address] [place], [place] [address]    Special Requests   Final    BAA BCAV Performed at [facility] [address], [place], [place] [address]    Culture  Setup Time   Final    GRAM POSITIVE COCCI GRAM NEGATIVE RODS IN BOTH AEROBIC AND ANAEROBIC BOTTLES CRITICAL VALUE NOTED.  VALUE IS CONSISTENT WITH PREVIOUSLY REPORTED AND CALLED VALUE.    Culture (A)  Final    ESCHERICHIA COLI GROUP B STREP(S.AGALACTIAE)ISOLATED SUSCEPTIBILITIES PERFORMED ON PREVIOUS CULTURE WITHIN THE LAST 5 DAYS. Performed at [facility], [place] [address], [place], [place] [address]    Report Status 11/28/2018 FINAL  Final  Culture, blood (routine x 2)     Status: Abnormal   Collection Time: 11/25/18  9:08 AM  Result Value Ref Range Status   Specimen Description   Final    BLOOD LEFT ARM Performed at [facility] [address] [place], [place] [address]    Special Requests   Final    BAA BCAV Performed at [facility] [address] [place], [place] [address]    Culture  Setup Time   Final    GRAM POSITIVE COCCI GRAM NEGATIVE RODS IN BOTH AEROBIC AND ANAEROBIC BOTTLES CRITICAL RESULT CALLED TO, READ BACK BY AND VERIFIED WITH: E GREEN PHARMD 11/26/18 0025 JDW Performed at [facility], [place] [address], [place], [place] [address]    Culture (A)  Final    ESCHERICHIA COLI GROUP B STREP(S.AGALACTIAE)ISOLATED    Report Status 11/28/2018 FINAL  Final   Organism ID, Bacteria ESCHERICHIA COLI  Final   Organism ID, Bacteria GROUP B STREP(S.AGALACTIAE)ISOLATED  Final      Susceptibility  Escherichia coli - MIC*    AMPICILLIN >=32 RESISTANT Resistant     CEFAZOLIN <=4 SENSITIVE Sensitive     CEFEPIME <=1 SENSITIVE Sensitive     CEFTAZIDIME <=1 SENSITIVE Sensitive     CEFTRIAXONE <=1 SENSITIVE Sensitive     CIPROFLOXACIN >=4 RESISTANT Resistant     GENTAMICIN <=1 SENSITIVE  Sensitive     IMIPENEM <=0.25 SENSITIVE Sensitive     TRIMETH/SULFA <=20 SENSITIVE Sensitive     AMPICILLIN/SULBACTAM >=32 RESISTANT Resistant     PIP/TAZO <=4 SENSITIVE Sensitive     Extended ESBL NEGATIVE Sensitive     * ESCHERICHIA COLI   Group b strep(s.agalactiae)isolated - MIC*    CLINDAMYCIN >=1 RESISTANT Resistant     AMPICILLIN <=0.25 SENSITIVE Sensitive     ERYTHROMYCIN >=8 RESISTANT Resistant     VANCOMYCIN 0.5 SENSITIVE Sensitive     CEFTRIAXONE <=0.12 SENSITIVE Sensitive     LEVOFLOXACIN 1 SENSITIVE Sensitive     PENICILLIN SENSITIVE Sensitive     * GROUP B STREP(S.AGALACTIAE)ISOLATED  Blood Culture ID Panel (Reflexed)     Status: Abnormal   Collection Time: 11/25/18  9:08 AM  Result Value Ref Range Status   Enterococcus species NOT DETECTED NOT DETECTED Final   Listeria monocytogenes NOT DETECTED NOT DETECTED Final   Staphylococcus species NOT DETECTED NOT DETECTED Final   Staphylococcus aureus (BCID) NOT DETECTED NOT DETECTED Final   Streptococcus species DETECTED (A) NOT DETECTED Final    Comment: CRITICAL RESULT CALLED TO, READ BACK BY AND VERIFIED WITH: E GREEN PHARMD 11/26/18 0025 JDW    Streptococcus agalactiae DETECTED (A) NOT DETECTED Final    Comment: CRITICAL RESULT CALLED TO, READ BACK BY AND VERIFIED WITH: E GREEN PHARMD 11/26/18 0025 JDW    Streptococcus pneumoniae NOT DETECTED NOT DETECTED Final   Streptococcus pyogenes NOT DETECTED NOT DETECTED Final   Acinetobacter baumannii NOT DETECTED NOT DETECTED Final   Enterobacteriaceae species DETECTED (A) NOT DETECTED Final    Comment: Enterobacteriaceae represent a large family of gram-negative bacteria, not a single organism. CRITICAL RESULT CALLED TO, READ BACK BY AND VERIFIED WITH: E GREEN PHARMD 11/26/18 0025 JDW    Enterobacter cloacae complex NOT DETECTED NOT DETECTED Final   Escherichia coli DETECTED (A) NOT DETECTED Final    Comment: CRITICAL RESULT CALLED TO, READ BACK BY AND VERIFIED WITH: E GREEN  PHARMD 11/26/18 0025 JDW    Klebsiella oxytoca NOT DETECTED NOT DETECTED Final   Klebsiella pneumoniae NOT DETECTED NOT DETECTED Final   Proteus species NOT DETECTED NOT DETECTED Final   Serratia marcescens NOT DETECTED NOT DETECTED Final   Carbapenem resistance NOT DETECTED NOT DETECTED Final   Haemophilus influenzae NOT DETECTED NOT DETECTED Final   Neisseria meningitidis NOT DETECTED NOT DETECTED Final   Pseudomonas aeruginosa NOT DETECTED NOT DETECTED Final   Candida albicans NOT DETECTED NOT DETECTED Final   Candida glabrata NOT DETECTED NOT DETECTED Final   Candida krusei NOT DETECTED NOT DETECTED Final   Candida parapsilosis NOT DETECTED NOT DETECTED Final   Candida tropicalis NOT DETECTED NOT DETECTED Final    Comment: Performed at [facility], [place] [address], [place], [place] [address]  C difficile quick scan w PCR reflex     Status: Abnormal   Collection Time: 11/27/18  8:00 AM  Result Value Ref Range Status   C Diff antigen POSITIVE (A) NEGATIVE Final   C Diff toxin NEGATIVE NEGATIVE Final   C Diff interpretation Results are indeterminate. See PCR results.  Final    Comment: Performed at [facility], [place] [address], [place], [place] [address]  C. Diff by PCR, Reflexed     Status: Abnormal   Collection Time: 11/27/18  8:00 AM  Result Value Ref Range Status   Toxigenic C. Difficile by PCR POSITIVE (A) NEGATIVE Final    Comment: Positive for toxigenic C. difficile with little to no toxin production. Only treat if clinical presentation suggests symptomatic illness. Performed at [facility], [place] [address], [place], [place] [address]          Radiology Studies: No results found.      Scheduled Meds: . Chlorhexidine Gluconate Cloth  6 each Topical Daily  . levothyroxine  37.5 mcg Intravenous Daily  . mouth rinse  15 mL Mouth Rinse BID  . multivitamin with minerals  1 tablet Oral Daily  . nebivolol  10 mg Oral Daily  . nystatin  5  mL Oral QID  . scopolamine  1 patch Transdermal Q72H  . Tbo-filgastrim (GRANIX) SQ  480 mcg Subcutaneous q1800  . vancomycin  125 mg Oral QID   Continuous Infusions: . sodium chloride 500 mL (11/29/18 0537)  . sodium chloride    . calcium gluconate    . calcium gluconate    . cefTRIAXone (ROCEPHIN)  IV Stopped (11/28/18 2202)  . fluconazole (DIFLUCAN) IV Stopped (11/28/18 1335)  . potassium chloride 10 mEq (11/29/18 1114)  . potassium PHOSPHATE IVPB (in mmol)       LOS: 18 days   Time spent= 35 mins     Arsenio Loader, MD [facility]  If 7PM-7AM, please contact night-coverage [email or web address] 11/29/2018, 11:17 AM

## 2018-11-30 LAB — CBC
HEMATOCRIT: 23.3 % — AB (ref 36.0–46.0)
Hemoglobin: 7.2 g/dL — ABNORMAL LOW (ref 12.0–15.0)
MCH: 27.9 pg (ref 26.0–34.0)
MCHC: 30.9 g/dL (ref 30.0–36.0)
MCV: 90.3 fL (ref 80.0–100.0)
Platelets: 38 10*3/uL — ABNORMAL LOW (ref 150–400)
RBC: 2.58 MIL/uL — ABNORMAL LOW (ref 3.87–5.11)
RDW: 17.9 % — ABNORMAL HIGH (ref 11.5–15.5)
WBC: 0.4 10*3/uL — CL (ref 4.0–10.5)
nRBC: 5.1 % — ABNORMAL HIGH (ref 0.0–0.2)

## 2018-11-30 LAB — COMPREHENSIVE METABOLIC PANEL
ALK PHOS: 91 U/L (ref 38–126)
ALT: 56 U/L — ABNORMAL HIGH (ref 0–44)
AST: 39 U/L (ref 15–41)
Albumin: 1.7 g/dL — ABNORMAL LOW (ref 3.5–5.0)
Anion gap: 7 (ref 5–15)
BUN: 8 mg/dL (ref 6–20)
CO2: 22 mmol/L (ref 22–32)
Calcium: 6.4 mg/dL — CL (ref 8.9–10.3)
Chloride: 112 mmol/L — ABNORMAL HIGH (ref 98–111)
Creatinine, Ser: 0.49 mg/dL (ref 0.44–1.00)
GFR calc Af Amer: >60 mL/min (ref >60)
GFR calc non Af Amer: >60 mL/min (ref >60)
Glucose, Bld: 120 mg/dL — ABNORMAL HIGH (ref 70–99)
Potassium: 3.3 mmol/L — ABNORMAL LOW (ref 3.5–5.1)
Sodium: 141 mmol/L (ref 135–145)
Total Bilirubin: 5.6 mg/dL — ABNORMAL HIGH (ref 0.3–1.2)
Total Protein: 4.6 g/dL — ABNORMAL LOW (ref 6.5–8.1)

## 2018-11-30 LAB — PHOSPHORUS: Phosphorus: 2.9 mg/dL (ref 2.5–4.6)

## 2018-11-30 LAB — PREPARE RBC (CROSSMATCH)

## 2018-11-30 LAB — MAGNESIUM: Magnesium: 2.2 mg/dL (ref 1.7–2.4)

## 2018-11-30 MED ORDER — SODIUM CHLORIDE 0.9% IV SOLUTION
Freq: Once | INTRAVENOUS | Status: AC
  Administered 2018-11-30: 11:00:00 via INTRAVENOUS

## 2018-11-30 MED ORDER — POTASSIUM CHLORIDE CRYS ER 20 MEQ PO TBCR
20.0000 meq | EXTENDED_RELEASE_TABLET | Freq: Once | ORAL | Status: AC
  Administered 2018-11-30: 20 meq via ORAL
  Filled 2018-11-30: qty 1

## 2018-11-30 MED ORDER — POTASSIUM CHLORIDE 10 MEQ/100ML IV SOLN
10.0000 meq | INTRAVENOUS | Status: AC
  Administered 2018-11-30 (×4): 10 meq via INTRAVENOUS
  Filled 2018-11-30 (×4): qty 100

## 2018-11-30 MED ORDER — CALCIUM GLUCONATE-NACL 2-0.675 GM/100ML-% IV SOLN
2.0000 g | Freq: Once | INTRAVENOUS | Status: AC
  Administered 2018-11-30: 2000 mg via INTRAVENOUS
  Filled 2018-11-30: qty 100

## 2018-11-30 NOTE — Progress Notes (Addendum)
After voiding 350cc's on bedside commode post void residual still showing >999cc's on bladder scanner.  MD made aware.  Order placed for In and Out cath.  1300cc's of clear orange urine removed.

## 2018-11-30 NOTE — Progress Notes (Signed)
CSWs following pt's hospitalization for disposition needs. SNF has been recommended from therapy standpoint. Pt's medical instability, plan for chemotherapy, and lack of payor source are barriers to pursuing SNF placement currently. Will continue following as pt progresses to assess potential disposition plan.   Sharren Bridge, MSW, LCSW Clinical Social Work 11/30/2018 [phone] weekend coverage

## 2018-11-30 NOTE — Progress Notes (Signed)
Patient with clots in urine and moderate blood coming from vagina. Patient noted to be dizzy and c/o of being lightheaded when walking from the bathroom. VSS. Once patient go back to bed and vitals were obtained patient stated she was feeling better. NP on call made aware. Will continue to monitor closely.

## 2018-11-30 NOTE — Progress Notes (Signed)
PROGRESS NOTE    Cindy Ward  LOV:[id number] DOB: Nov 07, 1965 DOA: 11/11/2018 PCP: [name], MD   Brief Narrative:  53 year old female with history of essential hypertension, hypothyroidism initially presented to [facility] with complaints of recurrent nausea, vomiting, fever and night sweats.  Upon extensive work-up she was diagnosed with Hodgkin's lymphoma, stage IV.  Oncology team was consulted and patient was transferred here for further care and management.  Lymph node biopsy was consistent with classic Hodgkin's lymphoma.  Hospital course has been complicated by bacteremia, intermittent encephalopathy, severe protein calorie malnutrition and electrolyte imbalance.  She is also been pancytopenic since she is on chemotherapy receiving Granix.  Pulmonary and oncology team has been following.   Assessment & Plan:   Principal Problem:   Hodgkin lymphoma ([facility]) Active Problems:   Lymphadenopathy   Fever   Splenomegaly   Liver enzyme elevation   Thrombocytopenia (HCC)   Unintentional weight loss   Hashimoto's thyroiditis   Goiter   Hypertension   Aortic atherosclerosis (HCC)   Pancytopenia (HCC)   Malnutrition of moderate degree   Normocytic anemia   Neutropenic fever (HCC)   Oral thrush   Enteritis due to Clostridium difficile   Streptococcal bacteremia   Palliative care by specialist   Goals of care, counseling/discussion  Stage IV Hodgkin's lymphoma, new diagnosis - CT of the chest abdomen pelvis consistent with diffuse lymphadenopathy.  CT of the head is negative for any metastatic disease.  Lymph node biopsy performed 2/18-consistent with Hodgkin's lymphoma.  Port-A-Cath placed 2/24.  Chemotherapy started 2/25 -Onc following.  -Appreciate Palliative care input.   Pancytopenia with mild rectal bleeding overnight.  Anemia of chronic disease, hemoglobin 7.2 -Platelets remained stable at 38 today.  Continue Granix.  Oncology team following. -1 unit of PRBC  transfusion-irradiated blood.  Abnormal breath sounds - Encouraged to use incentive spirometry and flutter valve.  Bronchodilators ordered.  Sinus tachycardia -Slightly improved with hydration and beta-blockers.  Bacteremia with E. coli and group B strep with neutropenia - ID recommends continue Rocephin/fluconazole-day 5.  Oral vancomycin day 3 -Continue to follow blood cultures.  Repeat cx sent on 11/28/18. - Awaiting fungal culture and strep agalactiae data - C. difficile antigen positive, toxin negative.    Elevated Total Bilirubin; possible due to medications.  -CT of the abdomen pelvis is negative for any abnormal pathology in the right upper quadrant.  Still trending up. -Right upper quadrant ultrasound- negative.  If continues to trend up, will consult gastroenterology  Acute metabolic encephalopathy/delirium; improved.  Visual hallucination; resolved.  -CT of the head does not show any evidence of metastases.  May need MRI of the brain.  Most likely this is metabolic in nature.  We will continue to monitor this.  Hypokalemia/hypophosphatemia/hypocalcemia -Aggressive repletion  Acute urinary retention with bilateral hydroureteronephrosis -Foley catheter removed.  Urine output is inconsistent.  I have advised nursing staff to continue periodic bladder scans  Severe protein calorie malnutrition Mild dysphagia with oral thrush - Encourage p.o. intake.  Nutrition consultation. - Nystatin and Diflucan given.  Hypothyroidism -Continue Synthroid  Generalized debility - PT following- will eventually need skilled nursing facility  DVT prophylaxis: SCDs Code Status: Full code Family Communication: None at bedside Disposition Plan: Transfer to telemetry  Consultants:   Oncology   PCCM  Procedures:   Bx 2/21  Griffin cath 2/24  Antimicrobials:  Anti-infectives (From admission, onward)   Start     Dose/Rate Route Frequency Ordered Stop   11/27/18 1130  vancomycin  (  VANCOCIN) 50 mg/mL oral solution 125 mg     125 mg Oral 4 times daily 11/27/18 1051 12/07/18 0959   11/27/18 0915  metroNIDAZOLE (FLAGYL) IVPB 500 mg  Status:  Discontinued     500 mg 100 mL/hr over 60 Minutes Intravenous Every 8 hours 11/27/18 0912 11/27/18 1051   11/26/18 0200  cefTRIAXone (ROCEPHIN) 2 g in sodium chloride 0.9 % 100 mL IVPB     2 g 200 mL/hr over 30 Minutes Intravenous Daily at bedtime 11/26/18 0059     11/25/18 1200  fluconazole (DIFLUCAN) IVPB 200 mg     200 mg 100 mL/hr over 60 Minutes Intravenous Every 24 hours 11/25/18 0836     11/25/18 1000  vancomycin (VANCOCIN) IVPB 1000 mg/200 mL premix  Status:  Discontinued     1,000 mg 200 mL/hr over 60 Minutes Intravenous Every 12 hours 11/25/18 0848 11/26/18 0827   11/25/18 1000  ceFEPIme (MAXIPIME) 2 g in sodium chloride 0.9 % 100 mL IVPB  Status:  Discontinued     2 g 200 mL/hr over 30 Minutes Intravenous Every 8 hours 11/25/18 0848 11/26/18 0058   11/25/18 0900  vancomycin (VANCOCIN) 1,500 mg in sodium chloride 0.9 % 500 mL IVPB     1,500 mg 250 mL/hr over 120 Minutes Intravenous  Once 11/25/18 0848 11/25/18 1135   11/24/18 1500  fluconazole (DIFLUCAN) tablet 200 mg  Status:  Discontinued     200 mg Oral Daily 11/24/18 1450 11/25/18 0836   11/18/18 0600  ceFAZolin (ANCEF) IVPB 2g/100 mL premix     2 g 200 mL/hr over 30 Minutes Intravenous To Radiology 11/17/18 1243 11/18/18 1130   11/12/18 0800  cefTRIAXone (ROCEPHIN) 2 g in sodium chloride 0.9 % 100 mL IVPB  Status:  Discontinued     2 g 200 mL/hr over 30 Minutes Intravenous Every 24 hours 11/11/18 1711 11/12/18 1057         Subjective: Sitting up in the bed.  Does not have any new complaints.  Appetite still remains poor.  I have encouraged to use incentive spirometry.  She continues to have poor effort on it.  Review of Systems Otherwise negative except as per HPI, including: General = no fevers, chills, dizziness, malaise, fatigue HEENT/EYES = negative  for pain, redness, loss of vision, double vision, blurred vision, loss of hearing, sore throat, hoarseness, dysphagia Cardiovascular= negative for chest pain, palpitation, murmurs, lower extremity swelling Respiratory/lungs= negative for shortness of breath, cough, hemoptysis, wheezing, mucus production Gastrointestinal= negative for nausea, vomiting,, abdominal pain, melena, hematemesis Genitourinary= negative for Dysuria, Hematuria, Change in Urinary Frequency MSK = Negative for arthralgia, myalgias, Back Pain, Joint swelling  Neurology= Negative for headache, seizures, numbness, tingling  Psychiatry= Negative for anxiety, depression, suicidal and homocidal ideation Allergy/Immunology= Medication/Food allergy as listed  Skin= Negative for Rash, lesions, ulcers, itching   Objective: Vitals:   11/30/18 0751 11/30/18 0800 11/30/18 0900 11/30/18 1029  BP:  117/72 113/61   Pulse:   (!) 110   Resp:  (!) 24 20   Temp: 98.9 F (37.2 C)     TempSrc: Oral     SpO2:   96% 96%  Weight:      Height:        Intake/Output Summary (Last 24 hours) at 11/30/2018 1045 Last data filed at 11/30/2018 0400 Gross per 24 hour  Intake 885.92 ml  Output 675 ml  Net 210.92 ml   Filed Weights   11/28/18 0430 11/29/18 0500 11/30/18 0403  Weight: 66 kg 70.2 kg 67.6 kg    Examination:  Constitutional: NAD, calm, comfortable, chronically ill-appearing Eyes: PERRL, lids and conjunctivae normal ENMT: Mucous membranes are dry posterior pharynx clear of any exudate or lesions.Normal dentition.  Neck: normal, supple, no masses, no thyromegaly Respiratory: Slightly diffuse coarse breath sounds Cardiovascular: Regular rate and rhythm, no murmurs / rubs / gallops. No extremity edema. 2+ pedal pulses. No carotid bruits.  Abdomen: no tenderness, no masses palpated. No hepatosplenomegaly. Bowel sounds positive.  Musculoskeletal: no clubbing / cyanosis. No joint deformity upper and lower extremities. Good ROM, no  contractures. Normal muscle tone.  Skin: no rashes, lesions, ulcers. No induration Neurologic: CN 2-12 grossly intact. Sensation intact, DTR normal. Strength 4/5 in all 4.  Psychiatric: Normal judgment and insight. Alert and oriented x 3. Normal mood.   Port-A-Cath in place  Data Reviewed:   CBC: Recent Labs  Lab 11/24/18 0600  11/26/18 0619 11/27/18 0500 11/28/18 0440 11/29/18 0500 11/30/18 0345  WBC 0.5*   < > 0.2* 0.2* 0.3* 0.4* 0.4*  NEUTROABS 0.2*  --   --   --   --   --   --   HGB 9.4*   < > 8.2* 8.3* 7.9* 8.0* 7.2*  HCT 29.6*   < > 24.7* 25.9* 24.3* 25.8* 23.3*  MCV 88.9   < > 85.2 86.6 87.4 89.6 90.3  PLT 28*   < > 18* 12* 11* 38* 38*   < > = values in this interval not displayed.   Basic Metabolic Panel: Recent Labs  Lab 11/26/18 1722 11/26/18 1900 11/27/18 0500 11/27/18 1600 11/28/18 0440 11/28/18 1736 11/29/18 0500 11/30/18 0345  NA 147*  --  147* 147* 144 145 143 141  K 3.3*  --  2.4* 2.8* 3.3* 3.4* 3.5 3.3*  CL 110  --  107 109 110 112* 109 112*  CO2 25  --  29 26 25 24 25 22   GLUCOSE 143*  --  186* 162* 141* 147* 159* 120*  BUN <5*  --  6 6 7 9 9 8   CREATININE 0.56  --  0.59 0.55 0.58 0.57 0.61 0.49  CALCIUM <4.0*  --  <4.0* 4.0* 4.5* 5.1* 5.6* 6.4*  MG 1.6*  --  2.0  --  2.5*  --  2.2 2.2  PHOS  --  <1.0* 1.8*  --  2.0*  --  1.7* 2.9   GFR: Estimated Creatinine Clearance: 82 mL/min (by C-G formula based on SCr of 0.49 mg/dL). Liver Function Tests: Recent Labs  Lab 11/27/18 1600 11/28/18 0440 11/28/18 1736 11/29/18 0500 11/30/18 0345  AST 34 41 36 33 39  ALT 53* 60* 60* 59* 56*  ALKPHOS 108 110 109 106 91  BILITOT 4.0* 4.3* 5.1* 5.2* 5.6*  PROT 4.9* 5.0* 5.1* 5.1* 4.6*  ALBUMIN 1.7* 1.9* 1.9* 2.0* 1.7*   No results for input(s): LIPASE, AMYLASE in the last 168 hours. Recent Labs  Lab 11/25/18 1228  AMMONIA 24   Coagulation Profile: No results for input(s): INR, PROTIME in the last 168 hours. Cardiac Enzymes: No results for  input(s): CKTOTAL, CKMB, CKMBINDEX, TROPONINI in the last 168 hours. BNP (last 3 results) No results for input(s): PROBNP in the last 8760 hours. HbA1C: No results for input(s): HGBA1C in the last 72 hours. CBG: No results for input(s): GLUCAP in the last 168 hours. Lipid Profile: No results for input(s): CHOL, HDL, LDLCALC, TRIG, CHOLHDL, LDLDIRECT in the last 72 hours. Thyroid Function Tests: No results  for input(s): TSH, T4TOTAL, FREET4, T3FREE, THYROIDAB in the last 72 hours. Anemia Panel: No results for input(s): VITAMINB12, FOLATE, FERRITIN, TIBC, IRON, RETICCTPCT in the last 72 hours. Sepsis Labs: No results for input(s): PROCALCITON, LATICACIDVEN in the last 168 hours.  Recent Results (from the past 240 hour(s))  Culture, blood (routine x 2)     Status: None   Collection Time: 11/21/18  2:51 PM  Result Value Ref Range Status   Specimen Description   Final    BLOOD LEFT ANTECUBITAL Performed at [facility] [address], [place], [place] [address]    Special Requests   Final    BOTTLES DRAWN AEROBIC AND ANAEROBIC Blood Culture adequate volume Performed at [facility] [address], [place], [place] [address]    Culture   Final    NO GROWTH 5 DAYS Performed at [facility], [place] [address] [place], [place] [address]    Report Status 11/26/2018 FINAL  Final  Culture, blood (routine x 2)     Status: None   Collection Time: 11/21/18  2:59 PM  Result Value Ref Range Status   Specimen Description   Final    BLOOD RIGHT ANTECUBITAL Performed at [facility] [address], [place], [place] [address]    Special Requests   Final    BOTTLES DRAWN AEROBIC ONLY Blood Culture adequate volume Performed at [facility] [address], [place], [place] [address]    Culture   Final    NO GROWTH 5 DAYS Performed at [facility], [place] [address], [place], [place] [address]    Report  Status 11/26/2018 FINAL  Final  Culture, blood (routine x 2)     Status: Abnormal   Collection Time: 11/25/18  9:08 AM  Result Value Ref Range Status   Specimen Description   Final    BLOOD RIGHT HAND Performed at [facility] [address], [place], [place] [address]    Special Requests   Final    BAA BCAV Performed at [facility] [address], [place], [place] [address]    Culture  Setup Time   Final    GRAM POSITIVE COCCI GRAM NEGATIVE RODS IN BOTH AEROBIC AND ANAEROBIC BOTTLES CRITICAL VALUE NOTED.  VALUE IS CONSISTENT WITH PREVIOUSLY REPORTED AND CALLED VALUE.    Culture (A)  Final    ESCHERICHIA COLI GROUP B STREP(S.AGALACTIAE)ISOLATED SUSCEPTIBILITIES PERFORMED ON PREVIOUS CULTURE WITHIN THE LAST 5 DAYS. Performed at [facility], [place] [address], [place], [place] [address]    Report Status 11/28/2018 FINAL  Final  Culture, blood (routine x 2)     Status: Abnormal   Collection Time: 11/25/18  9:08 AM  Result Value Ref Range Status   Specimen Description   Final    BLOOD LEFT ARM Performed at [facility] [address] [place], [place] [address]    Special Requests   Final    BAA BCAV Performed at [facility] [address] [place], [place] [address]    Culture  Setup Time   Final    GRAM POSITIVE COCCI GRAM NEGATIVE RODS IN BOTH AEROBIC AND ANAEROBIC BOTTLES CRITICAL RESULT CALLED TO, READ BACK BY AND VERIFIED WITH: E GREEN PHARMD 11/26/18 0025 JDW Performed at [facility], [place] [address] [place], [place] [address]    Culture (A)  Final    ESCHERICHIA COLI GROUP  B STREP(S.AGALACTIAE)ISOLATED    Report Status 11/28/2018 FINAL  Final   Organism ID, Bacteria ESCHERICHIA COLI  Final   Organism ID, Bacteria GROUP B STREP(S.AGALACTIAE)ISOLATED  Final      Susceptibility   Escherichia coli - MIC*    AMPICILLIN >=32 RESISTANT Resistant     CEFAZOLIN <=4 SENSITIVE  Sensitive     CEFEPIME <=1 SENSITIVE Sensitive     CEFTAZIDIME <=1 SENSITIVE Sensitive     CEFTRIAXONE <=1 SENSITIVE Sensitive     CIPROFLOXACIN >=4 RESISTANT Resistant     GENTAMICIN <=1 SENSITIVE Sensitive     IMIPENEM <=0.25 SENSITIVE Sensitive     TRIMETH/SULFA <=20 SENSITIVE Sensitive     AMPICILLIN/SULBACTAM >=32 RESISTANT Resistant     PIP/TAZO <=4 SENSITIVE Sensitive     Extended ESBL NEGATIVE Sensitive     * ESCHERICHIA COLI   Group b strep(s.agalactiae)isolated - MIC*    CLINDAMYCIN >=1 RESISTANT Resistant     AMPICILLIN <=0.25 SENSITIVE Sensitive     ERYTHROMYCIN >=8 RESISTANT Resistant     VANCOMYCIN 0.5 SENSITIVE Sensitive     CEFTRIAXONE <=0.12 SENSITIVE Sensitive     LEVOFLOXACIN 1 SENSITIVE Sensitive     PENICILLIN SENSITIVE Sensitive     * GROUP B STREP(S.AGALACTIAE)ISOLATED  Blood Culture ID Panel (Reflexed)     Status: Abnormal   Collection Time: 11/25/18  9:08 AM  Result Value Ref Range Status   Enterococcus species NOT DETECTED NOT DETECTED Final   Listeria monocytogenes NOT DETECTED NOT DETECTED Final   Staphylococcus species NOT DETECTED NOT DETECTED Final   Staphylococcus aureus (BCID) NOT DETECTED NOT DETECTED Final   Streptococcus species DETECTED (A) NOT DETECTED Final    Comment: CRITICAL RESULT CALLED TO, READ BACK BY AND VERIFIED WITH: E GREEN PHARMD 11/26/18 0025 JDW    Streptococcus agalactiae DETECTED (A) NOT DETECTED Final    Comment: CRITICAL RESULT CALLED TO, READ BACK BY AND VERIFIED WITH: E GREEN PHARMD 11/26/18 0025 JDW    Streptococcus pneumoniae NOT DETECTED NOT DETECTED Final   Streptococcus pyogenes NOT DETECTED NOT DETECTED Final   Acinetobacter baumannii NOT DETECTED NOT DETECTED Final   Enterobacteriaceae species DETECTED (A) NOT DETECTED Final    Comment: Enterobacteriaceae represent a large family of gram-negative bacteria, not a single organism. CRITICAL RESULT CALLED TO, READ BACK BY AND VERIFIED WITH: E GREEN PHARMD 11/26/18 0025  JDW    Enterobacter cloacae complex NOT DETECTED NOT DETECTED Final   Escherichia coli DETECTED (A) NOT DETECTED Final    Comment: CRITICAL RESULT CALLED TO, READ BACK BY AND VERIFIED WITH: E GREEN PHARMD 11/26/18 0025 JDW    Klebsiella oxytoca NOT DETECTED NOT DETECTED Final   Klebsiella pneumoniae NOT DETECTED NOT DETECTED Final   Proteus species NOT DETECTED NOT DETECTED Final   Serratia marcescens NOT DETECTED NOT DETECTED Final   Carbapenem resistance NOT DETECTED NOT DETECTED Final   Haemophilus influenzae NOT DETECTED NOT DETECTED Final   Neisseria meningitidis NOT DETECTED NOT DETECTED Final   Pseudomonas aeruginosa NOT DETECTED NOT DETECTED Final   Candida albicans NOT DETECTED NOT DETECTED Final   Candida glabrata NOT DETECTED NOT DETECTED Final   Candida krusei NOT DETECTED NOT DETECTED Final   Candida parapsilosis NOT DETECTED NOT DETECTED Final   Candida tropicalis NOT DETECTED NOT DETECTED Final    Comment: Performed at [facility], [place] [address], [place], [place] [address]  Fungus culture, blood     Status: None (Preliminary result)   Collection Time: 11/25/18  1:26  PM  Result Value Ref Range Status   Specimen Description   Final    BLOOD Performed at [facility] [address], [place], [place] [address]    Special Requests   Final    NONE Performed at [facility], [place] [address], [place], [place] [address]    Culture   Final    NO GROWTH 4 DAYS Performed at [facility], [place] [address], [place], [place] [address]    Report Status PENDING  Incomplete  C difficile quick scan w PCR reflex     Status: Abnormal   Collection Time: 11/27/18  8:00 AM  Result Value Ref Range Status   C Diff antigen POSITIVE (A) NEGATIVE Final   C Diff toxin NEGATIVE NEGATIVE Final   C Diff interpretation Results are indeterminate. See PCR results.  Final    Comment: Performed at [facility], [place] [address]  [place], [place] [address]  C. Diff by PCR, Reflexed     Status: Abnormal   Collection Time: 11/27/18  8:00 AM  Result Value Ref Range Status   Toxigenic C. Difficile by PCR POSITIVE (A) NEGATIVE Final    Comment: Positive for toxigenic C. difficile with little to no toxin production. Only treat if clinical presentation suggests symptomatic illness. Performed at [facility], [place] [address], [place], [place] [address]   Culture, blood (Routine X 2) w Reflex to ID Panel     Status: None (Preliminary result)   Collection Time: 11/28/18 12:19 AM  Result Value Ref Range Status   Specimen Description   Final    BLOOD RIGHT HAND Performed at [facility] [address] [place], [place] [address]    Special Requests   Final    BOTTLES DRAWN AEROBIC AND ANAEROBIC Blood Culture adequate volume Performed at [facility] [address], [place], [place] [address]    Culture   Final    NO GROWTH 2 DAYS Performed at [facility] [address], [place], [place] [address]    Report Status PENDING  Incomplete  Culture, blood (Routine X 2) w Reflex to ID Panel     Status: None (Preliminary result)   Collection Time: 11/28/18 12:19 AM  Result Value Ref Range Status   Specimen Description   Final    BLOOD LEFT HAND Performed at [facility] [address] [place], [place] [address]    Special Requests   Final    BOTTLES DRAWN AEROBIC AND ANAEROBIC Blood Culture adequate volume Performed at [facility] [address] [place], [place] [address]    Culture   Final    NO GROWTH 2 DAYS Performed at [facility] [address] [place], [place] [address]    Report Status PENDING  Incomplete         Radiology Studies: US Abdomen Limited Ruq  Result Date: 11/29/2018 CLINICAL DATA:  Elevated total bilirubin = 5.2 EXAM: ULTRASOUND ABDOMEN LIMITED RIGHT UPPER QUADRANT COMPARISON:  CT abdomen and  pelvis 11/27/2018 FINDINGS: Gallbladder: Normally distended without stones or wall thickening. No pericholecystic fluid or sonographic Murphy sign. Common bile duct: Diameter: 3 mm diameter, normal Liver: Normal hepatic echogenicity. No mass or nodularity. No intrahepatic biliary dilatation. Portal vein is patent on color Doppler imaging with normal direction of blood flow towards the liver. No RIGHT upper quadrant free fluid. IMPRESSION: Normal exam. Electronically Signed  By: Lavonia Dana M.D.   On: 11/29/2018 14:34        Scheduled Meds: . sodium chloride   Intravenous Once  . Chlorhexidine Gluconate Cloth  6 each Topical Daily  . levothyroxine  37.5 mcg Intravenous Daily  . mouth rinse  15 mL Mouth Rinse BID  . multivitamin with minerals  1 tablet Oral Daily  . nebivolol  10 mg Oral Daily  . nystatin  5 mL Oral QID  . scopolamine  1 patch Transdermal Q72H  . Tbo-filgastrim (GRANIX) SQ  480 mcg Subcutaneous q1800  . vancomycin  125 mg Oral QID   Continuous Infusions: . sodium chloride 500 mL (11/29/18 0537)  . sodium chloride    . cefTRIAXone (ROCEPHIN)  IV Stopped (11/29/18 2207)  . fluconazole (DIFLUCAN) IV Stopped (11/29/18 1317)  . potassium chloride 10 mEq (11/30/18 1015)     LOS: 19 days   Time spent= 40 mins    Ankit Arsenio Loader, MD [facility]  If 7PM-7AM, please contact night-coverage [email or web address] 11/30/2018, 10:45 AM

## 2018-11-30 NOTE — Progress Notes (Signed)
CRITICAL VALUE ALERT  Critical Value:  Calcium 6.4  Date & Time Notied:  0427  Provider Notified: [place]  Orders Received/Actions taken: Awaiting orders. Will continue to monitor

## 2018-11-30 NOTE — Progress Notes (Signed)
Report called to RN on floor and pt transferred to rm 1441. All RN's questions answered to his satisfaction. Pt left unit in bed pushed by Judson Roch, RN and NT. 15-minute blood vitals done bt Sarah. RN.  Madelin Headings.

## 2018-12-01 LAB — COMPREHENSIVE METABOLIC PANEL
ALT: 64 U/L — ABNORMAL HIGH (ref 0–44)
ANION GAP: 7 (ref 5–15)
AST: 50 U/L — ABNORMAL HIGH (ref 15–41)
Albumin: 1.6 g/dL — ABNORMAL LOW (ref 3.5–5.0)
Alkaline Phosphatase: 97 U/L (ref 38–126)
BUN: 8 mg/dL (ref 6–20)
CO2: 23 mmol/L (ref 22–32)
Calcium: 7.5 mg/dL — ABNORMAL LOW (ref 8.9–10.3)
Chloride: 113 mmol/L — ABNORMAL HIGH (ref 98–111)
Creatinine, Ser: 0.47 mg/dL (ref 0.44–1.00)
GFR calc Af Amer: >60 mL/min (ref >60)
GFR calc non Af Amer: >60 mL/min (ref >60)
Glucose, Bld: 98 mg/dL (ref 70–99)
Potassium: 3.5 mmol/L (ref 3.5–5.1)
Sodium: 143 mmol/L (ref 135–145)
Total Bilirubin: 6.7 mg/dL — ABNORMAL HIGH (ref 0.3–1.2)
Total Protein: 4.7 g/dL — ABNORMAL LOW (ref 6.5–8.1)

## 2018-12-01 LAB — CBC WITH DIFFERENTIAL/PLATELET
Abs Immature Granulocytes: 0.04 10*3/uL (ref 0.00–0.07)
BASOS ABS: 0 10*3/uL (ref 0.0–0.1)
Basophils Relative: 0 %
Eosinophils Absolute: 0 10*3/uL (ref 0.0–0.5)
Eosinophils Relative: 0 %
HCT: 26 % — ABNORMAL LOW (ref 36.0–46.0)
Hemoglobin: 8.1 g/dL — ABNORMAL LOW (ref 12.0–15.0)
Immature Granulocytes: 6 %
Lymphocytes Relative: 47 %
Lymphs Abs: 0.3 10*3/uL — ABNORMAL LOW (ref 0.7–4.0)
MCH: 28.1 pg (ref 26.0–34.0)
MCHC: 31.2 g/dL (ref 30.0–36.0)
MCV: 90.3 fL (ref 80.0–100.0)
Monocytes Absolute: 0.1 10*3/uL (ref 0.1–1.0)
Monocytes Relative: 16 %
NRBC: 0 % (ref 0.0–0.2)
Neutro Abs: 0.2 10*3/uL — ABNORMAL LOW (ref 1.7–7.7)
Neutrophils Relative %: 31 %
Platelets: 61 10*3/uL — ABNORMAL LOW (ref 150–400)
RBC: 2.88 MIL/uL — ABNORMAL LOW (ref 3.87–5.11)
RDW: 17.2 % — ABNORMAL HIGH (ref 11.5–15.5)
WBC: 0.6 10*3/uL — CL (ref 4.0–10.5)

## 2018-12-01 LAB — PHOSPHORUS: Phosphorus: 2.9 mg/dL (ref 2.5–4.6)

## 2018-12-01 LAB — MAGNESIUM: Magnesium: 1.9 mg/dL (ref 1.7–2.4)

## 2018-12-01 LAB — HEMOGLOBIN AND HEMATOCRIT, BLOOD
HCT: 25.4 % — ABNORMAL LOW (ref 36.0–46.0)
Hemoglobin: 7.8 g/dL — ABNORMAL LOW (ref 12.0–15.0)

## 2018-12-01 MED ORDER — VITAMIN K1 10 MG/ML IJ SOLN
10.0000 mg | Freq: Every day | INTRAMUSCULAR | Status: AC
  Administered 2018-12-01 – 2018-12-03 (×3): 10 mg via SUBCUTANEOUS
  Filled 2018-12-01 (×3): qty 1

## 2018-12-01 MED ORDER — POTASSIUM CHLORIDE CRYS ER 20 MEQ PO TBCR
40.0000 meq | EXTENDED_RELEASE_TABLET | Freq: Once | ORAL | Status: AC
  Administered 2018-12-01: 40 meq via ORAL
  Filled 2018-12-01: qty 2

## 2018-12-01 NOTE — Progress Notes (Signed)
CRITICAL VALUE ALERT  Critical Value:  Wbc 0.6  Date & Time Notied:  12/01/18 0604  Provider Notified: Kennon Holter  Orders Received/Actions taken: No new orders placed

## 2018-12-01 NOTE — Consult Note (Addendum)
Consultation  Referring Provider:    Dr. Reesa Chew Primary Care Physician:  [name], MD Primary Gastroenterologist: Althia Forts        Reason for Consultation: Elevated bilirubin             HPI:   Cindy Ward is a 53 y.o. 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  We are called in regards to an increasing bilirubin.    Per medicine team they have done a complete work-up and patient has fever of unknown origin.  There are plans to remove a lymph node with further evaluation.    Today, the patient is a poor historian as she does not elicit many details.  Some of her history is garnered from previous notes.  Apparently at admission described a 31-monthhistory of cough, fever, chills, night sweats and nausea and vomiting.  The symptoms occur about every 2 weeks and can occur for days at a time typically at night.  They have been taking longer and longer to resolve.  She came to the hospital 11/30/2018 because she "passed out" at the cancer center describing getting dizzy and then hitting the floor.  Also describes a weight loss of about 40 pounds since June.    Patient denies any consistent abdominal pain, tells me that occasionally she will have some that "comes and goes".  Tells me she has regular bowel movements.  Does have history of tattoos, tells me she believes she was tested for hepatitis in the past, but is unsure when.  Denies family history of liver disease or previous IV drug use.    11/19/2018 patient received day 1 cycle 1 AVD +Adcetris chemotherapy (at that time LFT's with tot bili normal 1.2, alk phos 154, ast and alt normal. It appears Bilirubin has been elevated since 11/27/18 at 4.2 with gradual increase since that time.    Denies heartburn or reflux.  [facility], worsening anemia to 7, worsening  thrombocytopenia of 82, INR was 1.5, bilirubin was 2.1, AST 217 and ALT 63, urinalysis showing UTI, negative chest x-ray, CT abdomen and pelvis with notably worsened adenopathy in the bilateral axillary and subpectoral regions, left infrahilar region and upper abdomen/retroperitoneum compatible with lymphoma.  Also notable splenomegaly with mild splenic density heterogenicity.  New small to moderate sized pericardial effusion with a small amount of free pelvic fluid.  Stable thyroid goiter.  Trace bilateral pleural effusions.  Aortic atherosclerosis.  Past Medical History:  Diagnosis Date  . Hypertension   . Thyroid disease     Past Surgical History:  Procedure Laterality Date  . AXILLARY LYMPH NODE BIOPSY Left 11/12/2018   Procedure: AXILLARY LYMPH NODE BIOPSY LEFT;  Surgeon: BCoralie Keens MD;  Location: [facility];  Service: General;  Laterality: Left;  . IR IMAGING GUIDED PORT INSERTION  11/18/2018    History reviewed. No pertinent family history.   Social History   Tobacco Use  . Smoking status: Never Smoker  . Smokeless tobacco: Never Used  Substance Use Topics  . Alcohol use: Never    Frequency: Never  . Drug use: Never    Prior to Admission medications   Medication Sig Start Date End Date Taking? Authorizing Provider  levothyroxine (SYNTHROID, LEVOTHROID) 75 MCG tablet Take 1 tablet by mouth daily. 11/23/17  Yes [provider]  Multiple Vitamin (MULTIVITAMIN WITH MINERALS) TABS tablet  Take 1 tablet by mouth daily.   Yes [provider]  nebivolol (BYSTOLIC) 10 MG tablet Take 1 tablet by mouth daily. 10/13/16  Yes [provider]  ondansetron (ZOFRAN-ODT) 8 MG disintegrating tablet Take 1 tablet by mouth as needed for nausea/vomiting. PRN TID 02/27/18  Yes [provider]  PROAIR HFA 108 (90 Base) MCG/ACT inhaler Take 2 puffs by mouth 4 (four) times daily as needed. 02/27/18  Yes [provider]  dexamethasone (DECADRON) 4 MG tablet Take  2 tablets by mouth once a day on the day after chemotherapy and then take 2 tablets two times a day for 2 days. Take with food. 11/18/18   Tish Men, MD  lidocaine-prilocaine (EMLA) cream Apply to affected area once 11/18/18   Tish Men, MD  LORazepam (ATIVAN) 0.5 MG tablet Take 1 tablet (0.5 mg total) by mouth every 6 (six) hours as needed (Nausea or vomiting). 11/18/18   Tish Men, MD  ondansetron (ZOFRAN) 8 MG tablet Take 1 tablet (8 mg total) by mouth 2 (two) times daily as needed. Start on the third day after chemotherapy. 11/18/18   Tish Men, MD  prochlorperazine (COMPAZINE) 10 MG tablet Take 1 tablet (10 mg total) by mouth every 6 (six) hours as needed (Nausea or vomiting). 11/18/18   Tish Men, MD    Current Facility-Administered Medications  Medication Dose Route Frequency Provider Last Rate Last Dose  . 0.9 %  sodium chloride infusion   Intravenous PRN Rai, Ripudeep K, MD 10 mL/hr at 11/30/18 1420 1,000 mL at 11/30/18 1420  . 0.9 %  sodium chloride infusion   Intravenous Once Rai, Ripudeep K, MD      . acetaminophen (TYLENOL) tablet 650 mg  650 mg Oral Q6H PRN Rai, Ripudeep K, MD   650 mg at 11/25/18 0811   Or  . acetaminophen (TYLENOL) suppository 650 mg  650 mg Rectal Q6H PRN Rai, Ripudeep K, MD   650 mg at 11/25/18 1509  . cefTRIAXone (ROCEPHIN) 2 g in sodium chloride 0.9 % 100 mL IVPB  2 g Intravenous QHS Dorrene German, [facility]   Stopped at 11/30/18 2237  . Chlorhexidine Gluconate Cloth 2 % PADS 6 each  6 each Topical Daily Rai, Ripudeep K, MD   6 each at 11/30/18 1018  . feeding supplement (BOOST / RESOURCE BREEZE) liquid 1 Container  1 Container Oral Daily PRN Rai, Ripudeep K, MD      . fluconazole (DIFLUCAN) IVPB 200 mg  200 mg Intravenous Q24H Rai, Vernelle Emerald, MD   Stopped at 11/30/18 1523  . HYDROmorphone (DILAUDID) injection 0.5-1 mg  0.5-1 mg Intravenous Q4H PRN Rai, Ripudeep K, MD   1 mg at 11/26/18 1058  . ibuprofen (ADVIL,MOTRIN) tablet 400 mg  400 mg Oral Q8H PRN Rai,  Ripudeep K, MD   400 mg at 11/18/18 2334  . iohexol (OMNIPAQUE) 300 MG/ML solution 15 mL  15 mL Oral Once PRN Rai, Ripudeep K, MD   30 mL at 11/18/18 1740  . ipratropium-albuterol (DUONEB) 0.5-2.5 (3) MG/3ML nebulizer solution 3 mL  3 mL Nebulization Q4H PRN Amin, Ankit Chirag, MD      . levothyroxine (SYNTHROID, LEVOTHROID) injection 37.5 mcg  37.5 mcg Intravenous Daily Rai, Ripudeep K, MD   37.5 mcg at 11/30/18 0928  . lip balm (CARMEX) ointment   Topical PRN Rai, Ripudeep K, MD      . MEDLINE mouth rinse  15 mL Mouth Rinse BID Rai, Vernelle Emerald, MD  15 mL at 11/30/18 2207  . multivitamin with minerals tablet 1 tablet  1 tablet Oral Daily Amin, Ankit Chirag, MD   1 tablet at 11/30/18 0927  . nebivolol (BYSTOLIC) tablet 10 mg  10 mg Oral Daily Amin, Ankit Chirag, MD   10 mg at 11/30/18 0927  . nystatin (MYCOSTATIN) 100000 UNIT/ML suspension 500,000 Units  5 mL Oral QID Rai, Ripudeep K, MD   500,000 Units at 11/30/18 2205  . ondansetron (ZOFRAN) tablet 4 mg  4 mg Oral Q6H PRN Rai, Ripudeep K, MD       Or  . ondansetron (ZOFRAN) injection 4 mg  4 mg Intravenous Q6H PRN Rai, Ripudeep K, MD   4 mg at 11/28/18 1740  . oxyCODONE (Oxy IR/ROXICODONE) immediate release tablet 5-10 mg  5-10 mg Oral Q4H PRN Rai, Ripudeep K, MD   5 mg at 11/28/18 1739  . phenol (CHLORASEPTIC) mouth spray 1 spray  1 spray Mouth/Throat PRN Rai, Ripudeep K, MD      . potassium chloride SA (K-DUR,KLOR-CON) CR tablet 40 mEq  40 mEq Oral Once Amin, Ankit Chirag, MD      . protein supplement (UNJURY CHICKEN SOUP) powder 8 oz  8 oz Oral BID PRN Rai, Ripudeep K, MD      . scopolamine (TRANSDERM-SCOP) 1 MG/3DAYS 1.5 mg  1 patch Transdermal Q72H Rai, Ripudeep K, MD   1.5 mg at 11/30/18 1428  . sodium chloride flush (NS) 0.9 % injection 10-40 mL  10-40 mL Intracatheter PRN Rai, Ripudeep K, MD      . Tbo-Filgrastim [facility]) injection 480 mcg  480 mcg Subcutaneous q1800 Tish Men, MD   480 mcg at 11/30/18 1741  . vancomycin (VANCOCIN) 50  mg/mL oral solution 125 mg  125 mg Oral QID Maryanna Shape, NP   125 mg at 11/30/18 2205    Allergies as of 11/11/2018  . (No Known Allergies)     Review of Systems:    Constitutional: See HPI Skin: No rash  Cardiovascular: No chest pain  Respiratory: No SOB Gastrointestinal: See HPI and otherwise negative Genitourinary: No dysuria  Neurological:+dizziness and syncope Musculoskeletal: No new muscle or joint pain Hematologic: No bleeding  Psychiatric: No history of depression or anxiety    Physical Exam:  Vital signs in last 24 hours: Temp:  [98.3 F (36.8 C)-99.4 F (37.4 C)] 98.4 F (36.9 C) (03/08 0522) Pulse Rate:  [98-113] 98 (03/08 0522) Resp:  [14-24] 18 (03/08 0522) BP: (102-119)/(59-68) 119/60 (03/08 0522) SpO2:  [95 %-99 %] 98 % (03/08 0522) Weight:  [65.8 kg] 65.8 kg (03/08 0343) Last BM Date: 11/30/18 General:   Ill appearing Caucasian female appears to be in NAD, Well developed, Well nourished, alert and cooperative Head:  Normocephalic and atraumatic. Eyes:   PEERL, EOMI. +icteric Conjunctiva pink. Ears:  Normal auditory acuity. Neck:  Supple Throat: Oral cavity and pharynx without inflammation, swelling or lesion.  Lungs: Respirations even and unlabored. Lungs clear to auscultation bilaterally.   No wheezes, crackles, or rhonchi.  Heart: Normal S1, S2. No MRG. Regular rate and rhythm. No peripheral edema, cyanosis or pallor.  Abdomen:  Soft, nondistended, nontender. No rebound or guarding. Normal bowel sounds. No appreciable masses or hepatomegaly. Rectal:  Not performed.  Msk:  Symmetrical without gross deformities. Peripheral pulses intact.  Extremities:  Without edema, no deformity or joint abnormality.  Neurologic:  Alert and  oriented x4;  grossly normal neurologically.  Skin:   Dry and intact without significant lesions  or rashes. Psychiatric: Demonstrates good judgement and reason without abnormal affect or behaviors.   LAB RESULTS: Recent Labs     11/29/18 0500 11/30/18 0345 11/30/18 2356 12/01/18 0404  WBC 0.4* 0.4*  --  0.6*  HGB 8.0* 7.2* 7.8* 8.1*  HCT 25.8* 23.3* 25.4* 26.0*  PLT 38* 38*  --  61*   BMET Recent Labs    11/29/18 0500 11/30/18 0345 12/01/18 0404  NA 143 141 143  K 3.5 3.3* 3.5  CL 109 112* 113*  CO2 '25 22 23  ' GLUCOSE 159* 120* 98  BUN '9 8 8  ' CREATININE 0.61 0.49 0.47  CALCIUM 5.6* 6.4* 7.5*   Hepatic Function Latest Ref Rng & Units 12/01/2018 11/30/2018 11/29/2018  Total Protein 6.5 - 8.1 g/dL 4.7(L) 4.6(L) 5.1(L)  Albumin 3.5 - 5.0 g/dL 1.6(L) 1.7(L) 2.0(L)  AST 15 - 41 U/L 50(H) 39 33  ALT 0 - 44 U/L 64(H) 56(H) 59(H)  Alk Phosphatase 38 - 126 U/L 97 91 106  Total Bilirubin 0.3 - 1.2 mg/dL 6.7(H) 5.6(H) 5.2(H)  Bilirubin, Direct 0.0 - 0.2 mg/dL - - -   STUDIES: US Abdomen Limited Ruq  Result Date: 11/29/2018 CLINICAL DATA:  Elevated total bilirubin = 5.2 EXAM: ULTRASOUND ABDOMEN LIMITED RIGHT UPPER QUADRANT COMPARISON:  CT abdomen and pelvis 11/27/2018 FINDINGS: Gallbladder: Normally distended without stones or wall thickening. No pericholecystic fluid or sonographic Murphy sign. Common bile duct: Diameter: 3 mm diameter, normal Liver: Normal hepatic echogenicity. No mass or nodularity. No intrahepatic biliary dilatation. Portal vein is patent on color Doppler imaging with normal direction of blood flow towards the liver. No RIGHT upper quadrant free fluid. IMPRESSION: Normal exam. Electronically Signed   By: Lavonia Dana M.D.   On: 11/29/2018 14:34    Impression / Plan:   Impression: 1.  Elevated LFTs: CT abdomen pelvis and right upper quadrant ultrasound both showing normal liver and gallbladder, one dose of Adcetris chemo on 11/19/18 - this is hepatotoxic, hepatitis B and C, CMV, EBV 11/15/18 were negative, Ferritin on 11/14/18 elevated at 5,917; Consider relation to chemotherapy vs other 2.  Hodgkin Lymphoma Stage IV 3.  Fever of unknown origin  Plan: 1. 10 u sub Q vit K x3 days per Dr.  Henrene Pastor. 2. Will leave further testing to Dr. Henrene Pastor, please await further recommendations from him later today.  Thank you for your kind consultation, we will continue to follow.  Lavone Nian [facility]  12/01/2018, 8:51 AM  GI ATTENDING  History, laboratories, x-rays reviewed.  Patient seen and examined.  Agree with comprehensive consultation note as outlined above.  Asked to see the patient regarding rising bilirubin.  Alkaline phosphatase normal.  Transaminases minimally elevated.  All of this in the face of stage IV lymphoma for which she has recently started chemotherapy.  Now pancytopenic with fevers.  CT imaging does not demonstrate obvious infiltrative lymphoma.  No evidence for viral infection based on serologies.  I suspect that her elevated bilirubin is reactive.  There may be a degree of cholestasis from her therapies and/or her other medicines.  Her prothrombin time is slightly elevated.  Question vitamin K deficiency.  GI care at this point is mostly supportive.  Recommend subcu vitamin K for 3 days then repeat PT/INR.  The next hepatic function panel showed include fractionation of the bilirubin.  Otherwise, supportive care of her principal oncologic and hematologic issues.  GI will follow.  Thank you  Docia Chuck. Geri Seminole., M.D. [facility] Division of Gastroenterology

## 2018-12-01 NOTE — Evaluation (Signed)
Clinical/Bedside Swallow Evaluation Patient Details  Name: Cindy Ward MRN: [id number] Date of Birth: 1966/07/22  Today's Date: 12/01/2018 Time: SLP Start Time (ACUTE ONLY): 1434 SLP Stop Time (ACUTE ONLY): 1449 SLP Time Calculation (min) (ACUTE ONLY): 15 min  Past Medical History:  Past Medical History:  Diagnosis Date  . Hypertension   . Thyroid disease    Past Surgical History:  Past Surgical History:  Procedure Laterality Date  . AXILLARY LYMPH NODE BIOPSY Left 11/12/2018   Procedure: AXILLARY LYMPH NODE BIOPSY LEFT;  Surgeon: Coralie Keens, MD;  Location: [facility];  Service: General;  Laterality: Left;  . IR IMAGING GUIDED PORT INSERTION  11/18/2018   HPI:  53 year old female with history of essential hypertension, hypothyroidism initially presented to [facility] with complaints of recurrent nausea, vomiting, fever and night sweats.  Upon extensive work-up she was diagnosed with Hodgkin's lymphoma, stage IV.  Oncology team was consulted and patient was transferred here for further care and management.  Lymph node biopsy was consistent with classic Hodgkin's lymphoma.  Hospital course has been complicated by bacteremia, intermittent encephalopathy, severe protein calorie malnutrition and electrolyte imbalance.  She is also been pancytopenic since she is on chemotherapy receiving Granix. Noted to have mild dysphagia with oral thrush, Nystatin and Diflucan started.   Assessment / Plan / Recommendation Clinical Impression  Patient agreeable to only a limited quanity of thin liquid po trials. Presenting with delayed oral transit, c/o sore throat, and globus with pills only, all related to the presence of known oral thrush which is likely also in pharnyx and esophagus. No indication of aspiration observed. Recommend full liquid diet at this time to [facility] items which may ease oral and pharyngeal transfer of bolus and maximize po intake. SLP will f/u briefly for tolerance.   SLP  Visit Diagnosis: Dysphagia, oropharyngeal phase (R13.12)       Diet Recommendation Thin liquid(full liquids)   Liquid Administration via: Cup;Straw Medication Administration: Crushed with puree Supervision: Patient able to self feed Compensations: Slow rate;Small sips/bites Postural Changes: Seated upright at 90 degrees    Other  Recommendations Oral Care Recommendations: Oral care BID   Follow up Recommendations None      Frequency and Duration min 1 x/week  1 week       Prognosis Prognosis for Safe Diet Advancement: Good      Swallow Study   General HPI: 53 year old female with history of essential hypertension, hypothyroidism initially presented to [facility] with complaints of recurrent nausea, vomiting, fever and night sweats.  Upon extensive work-up she was diagnosed with Hodgkin's lymphoma, stage IV.  Oncology team was consulted and patient was transferred here for further care and management.  Lymph node biopsy was consistent with classic Hodgkin's lymphoma.  Hospital course has been complicated by bacteremia, intermittent encephalopathy, severe protein calorie malnutrition and electrolyte imbalance.  She is also been pancytopenic since she is on chemotherapy receiving Granix. Noted to have mild dysphagia with oral thrush, Nystatin and Diflucan started. Type of Study: Bedside Swallow Evaluation Previous Swallow Assessment: none Diet Prior to this Study: Thin liquids;Dysphagia 3 (soft) Temperature Spikes Noted: No Respiratory Status: Room air History of Recent Intubation: No Behavior/Cognition: Lethargic/Drowsy Oral Cavity Assessment: Dry(lingual thrush) Oral Care Completed by SLP: No Oral Cavity - Dentition: Adequate natural dentition Vision: Functional for self-feeding Self-Feeding Abilities: Able to feed self Patient Positioning: Upright in bed Baseline Vocal Quality: Breathy Volitional Cough: Weak Volitional Swallow: Able to elicit    Oral/Motor/Sensory  Function Overall Oral Motor/Sensory  Function: (limited ROM due to lingual pain)   Ice Chips Ice chips: Not tested   Thin Liquid Thin Liquid: Impaired Presentation: Cup;Self Fed Oral Phase Impairments: Reduced lingual movement/coordination Oral Phase Functional Implications: Prolonged oral transit    Nectar Thick Nectar Thick Liquid: Not tested   Honey Thick Honey Thick Liquid: Not tested   Puree Puree: Not tested(patient refused)   Solid     Solid: Not tested     Milan Clare MA, CCC-SLP   Keiran Gaffey Meryl 12/01/2018,2:53 PM

## 2018-12-01 NOTE — Progress Notes (Signed)
PROGRESS NOTE    Cindy Ward  KGM:[id number] DOB: 02-02-1966 DOA: 11/11/2018 PCP: [name], MD   Brief Narrative:  53 year old female with history of essential hypertension, hypothyroidism initially presented to [facility] with complaints of recurrent nausea, vomiting, fever and night sweats.  Upon extensive work-up she was diagnosed with Hodgkin's lymphoma, stage IV.  Oncology team was consulted and patient was transferred here for further care and management.  Lymph node biopsy was consistent with classic Hodgkin's lymphoma.  Hospital course has been complicated by bacteremia, intermittent encephalopathy, severe protein calorie malnutrition and electrolyte imbalance.  She is also been pancytopenic since she is on chemotherapy receiving Granix.  Pulmonary and oncology team has been following.   Assessment & Plan:   Principal Problem:   Hodgkin lymphoma ([place]) Active Problems:   Lymphadenopathy   Fever   Splenomegaly   Liver enzyme elevation   Thrombocytopenia (HCC)   Unintentional weight loss   Hashimoto's thyroiditis   Goiter   Hypertension   Aortic atherosclerosis (HCC)   Pancytopenia (HCC)   Malnutrition of moderate degree   Normocytic anemia   Neutropenic fever (HCC)   Oral thrush   Enteritis due to Clostridium difficile   Streptococcal bacteremia   Palliative care by specialist   Goals of care, counseling/discussion  Stage IV Hodgkin's lymphoma, new diagnosis - CT of the chest abdomen pelvis consistent with diffuse lymphadenopathy.  CT of the head is negative for any metastatic disease.  Lymph node biopsy performed 2/18-consistent with Hodgkin's lymphoma.  Port-A-Cath placed 2/24.  Chemotherapy started 2/25. Onc is following.  -Appreciate Palliative care input.   Pancytopenia with mild rectal bleeding overnight.  Anemia of chronic disease, hemoglobin 7.2 -Platelets remained stable at 61 today.  Continue Granix.  Oncology team following. -s/p PRBC tx. Hb  8.1  Abnormal breath sounds - Bronchodilators.  Incentive spirometry and flutter valve  Sinus tachycardia; improved.  - with hydration and beta-blockers.  Bacteremia with E. coli and group B strep with neutropenia - ID recommends continue Rocephin/fluconazole-day 6.  Oral vancomycin day 4 -Continue to follow blood cultures.  Repeat Cx 3/5= NGTD -Fungal Cx- NGTD 6 days and strep agalactiae data - C. difficile antigen positive, toxin negative.    Elevated Total Bilirubin; possible due to hepatotoxic meds -CT of the abdomen pelvis is negative for any abnormal pathology in the right upper quadrant.  Still trending up. -Right upper quadrant ultrasound- negative. GI consulted for their further input.   Acute metabolic encephalopathy/delirium; improved.  Visual hallucination; resolved.  -CT of the head does not show any evidence of metastases.  May need MRI of the brain.  Most likely this is metabolic in nature.  We will continue to monitor this.  Hypokalemia/hypophosphatemia/hypocalcemia -Aggressive repletion  Acute urinary retention with bilateral hydroureteronephrosis -Foley catheter removed.  Urine output is inconsistent. Frequent bladder scan and In and Out catheters as needed.   Severe protein calorie malnutrition Mild dysphagia with oral thrush - Encourage p.o. intake.  Nutrition consultation. - Nystatin and Diflucan given.  Hypothyroidism -Continue Synthroid  Generalized debility - PT following- will eventually need skilled nursing facility  DVT prophylaxis: SCDs Code Status: Full code Family Communication: None at bedside Disposition Plan: Maintain input due to multiple ongoing issues.   Consultants:   Oncology   PCCM  Procedures:   Bx 2/21  Port cath 2/24  Antimicrobials:  Anti-infectives (From admission, onward)   Start     Dose/Rate Route Frequency Ordered Stop   11/27/18 1130  vancomycin (VANCOCIN)  50 mg/mL oral solution 125 mg     125 mg Oral 4 times  daily 11/27/18 1051 12/07/18 0959   11/27/18 0915  metroNIDAZOLE (FLAGYL) IVPB 500 mg  Status:  Discontinued     500 mg 100 mL/hr over 60 Minutes Intravenous Every 8 hours 11/27/18 0912 11/27/18 1051   11/26/18 0200  cefTRIAXone (ROCEPHIN) 2 g in sodium chloride 0.9 % 100 mL IVPB     2 g 200 mL/hr over 30 Minutes Intravenous Daily at bedtime 11/26/18 0059     11/25/18 1200  fluconazole (DIFLUCAN) IVPB 200 mg     200 mg 100 mL/hr over 60 Minutes Intravenous Every 24 hours 11/25/18 0836     11/25/18 1000  vancomycin (VANCOCIN) IVPB 1000 mg/200 mL premix  Status:  Discontinued     1,000 mg 200 mL/hr over 60 Minutes Intravenous Every 12 hours 11/25/18 0848 11/26/18 0827   11/25/18 1000  ceFEPIme (MAXIPIME) 2 g in sodium chloride 0.9 % 100 mL IVPB  Status:  Discontinued     2 g 200 mL/hr over 30 Minutes Intravenous Every 8 hours 11/25/18 0848 11/26/18 0058   11/25/18 0900  vancomycin (VANCOCIN) 1,500 mg in sodium chloride 0.9 % 500 mL IVPB     1,500 mg 250 mL/hr over 120 Minutes Intravenous  Once 11/25/18 0848 11/25/18 1135   11/24/18 1500  fluconazole (DIFLUCAN) tablet 200 mg  Status:  Discontinued     200 mg Oral Daily 11/24/18 1450 11/25/18 0836   11/18/18 0600  ceFAZolin (ANCEF) IVPB 2g/100 mL premix     2 g 200 mL/hr over 30 Minutes Intravenous To Radiology 11/17/18 1243 11/18/18 1130   11/12/18 0800  cefTRIAXone (ROCEPHIN) 2 g in sodium chloride 0.9 % 100 mL IVPB  Status:  Discontinued     2 g 200 mL/hr over 30 Minutes Intravenous Every 24 hours 11/11/18 1711 11/12/18 1057         Subjective: No complaints, feels a little better.   Review of Systems Otherwise negative except as per HPI, including: General = no fevers, chills, dizziness, malaise, fatigue HEENT/EYES = negative for pain, redness, loss of vision, double vision, blurred vision, loss of hearing, sore throat, hoarseness, dysphagia Cardiovascular= negative for chest pain, palpitation, murmurs, lower extremity  swelling Respiratory/lungs= negative for shortness of breath, cough, hemoptysis, wheezing, mucus production Gastrointestinal= negative for nausea, vomiting,, abdominal pain, melena, hematemesis Genitourinary= negative for Dysuria, Hematuria, Change in Urinary Frequency MSK = Negative for arthralgia, myalgias, Back Pain, Joint swelling  Neurology= Negative for headache, seizures, numbness, tingling  Psychiatry= Negative for anxiety, depression, suicidal and homocidal ideation Allergy/Immunology= Medication/Food allergy as listed  Skin= Negative for Rash, lesions, ulcers, itching     Objective: Vitals:   11/30/18 2057 11/30/18 2251 12/01/18 0343 12/01/18 0522  BP: 110/64 102/61  119/60  Pulse: 99 (!) 101  98  Resp: 14   18  Temp: 99.4 F (37.4 C) 98.7 F (37.1 C)  98.4 F (36.9 C)  TempSrc: Oral Oral  Oral  SpO2: 97%   98%  Weight:   65.8 kg   Height:        Intake/Output Summary (Last 24 hours) at 12/01/2018 1022 Last data filed at 12/01/2018 0650 Gross per 24 hour  Intake 936.17 ml  Output 1775 ml  Net -838.83 ml   Filed Weights   11/29/18 0500 11/30/18 0403 12/01/18 0343  Weight: 70.2 kg 67.6 kg 65.8 kg    Examination:  Constitutional: NAD, calm, comfortable, temporal wasting.  Eyes: PERRL, lids and conjunctivae normal ENMT: Mucous membranes are moist. Posterior pharynx clear of any exudate or lesions.Normal dentition.  Neck: normal, supple, no masses, no thyromegaly Respiratory: clear to auscultation bilaterally, no wheezing, no crackles. Normal respiratory effort. No accessory muscle use.  Cardiovascular: Regular rate and rhythm, no murmurs / rubs / gallops. No extremity edema. 2+ pedal pulses. No carotid bruits.  Abdomen: no tenderness, no masses palpated. No hepatosplenomegaly. Bowel sounds positive.  Musculoskeletal: no clubbing / cyanosis. No joint deformity upper and lower extremities. Good ROM, no contractures. Normal muscle tone.  Skin: no rashes, lesions,  ulcers. No induration Neurologic: CN 2-12 grossly intact. Sensation intact, DTR normal. Strength 4/5 in all 4.  Psychiatric: Normal judgment and insight. Alert and oriented x 3. Normal mood.    Port-A-Cath in place  Data Reviewed:   CBC: Recent Labs  Lab 11/27/18 0500 11/28/18 0440 11/29/18 0500 11/30/18 0345 11/30/18 2356 12/01/18 0404  WBC 0.2* 0.3* 0.4* 0.4*  --  0.6*  NEUTROABS  --   --   --   --   --  0.2*  HGB 8.3* 7.9* 8.0* 7.2* 7.8* 8.1*  HCT 25.9* 24.3* 25.8* 23.3* 25.4* 26.0*  MCV 86.6 87.4 89.6 90.3  --  90.3  PLT 12* 11* 38* 38*  --  61*   Basic Metabolic Panel: Recent Labs  Lab 11/27/18 0500  11/28/18 0440 11/28/18 1736 11/29/18 0500 11/30/18 0345 12/01/18 0404  NA 147*   < > 144 145 143 141 143  K 2.4*   < > 3.3* 3.4* 3.5 3.3* 3.5  CL 107   < > 110 112* 109 112* 113*  CO2 29   < > 25 24 25 22 23   GLUCOSE 186*   < > 141* 147* 159* 120* 98  BUN 6   < > 7 9 9 8 8   CREATININE 0.59   < > 0.58 0.57 0.61 0.49 0.47  CALCIUM <4.0*   < > 4.5* 5.1* 5.6* 6.4* 7.5*  MG 2.0  --  2.5*  --  2.2 2.2 1.9  PHOS 1.8*  --  2.0*  --  1.7* 2.9 2.9   < > = values in this interval not displayed.   GFR: Estimated Creatinine Clearance: 82 mL/min (by C-G formula based on SCr of 0.47 mg/dL). Liver Function Tests: Recent Labs  Lab 11/28/18 0440 11/28/18 1736 11/29/18 0500 11/30/18 0345 12/01/18 0404  AST 41 36 33 39 50*  ALT 60* 60* 59* 56* 64*  ALKPHOS 110 109 106 91 97  BILITOT 4.3* 5.1* 5.2* 5.6* 6.7*  PROT 5.0* 5.1* 5.1* 4.6* 4.7*  ALBUMIN 1.9* 1.9* 2.0* 1.7* 1.6*   No results for input(s): LIPASE, AMYLASE in the last 168 hours. Recent Labs  Lab 11/25/18 1228  AMMONIA 24   Coagulation Profile: No results for input(s): INR, PROTIME in the last 168 hours. Cardiac Enzymes: No results for input(s): CKTOTAL, CKMB, CKMBINDEX, TROPONINI in the last 168 hours. BNP (last 3 results) No results for input(s): PROBNP in the last 8760 hours. HbA1C: No results for  input(s): HGBA1C in the last 72 hours. CBG: No results for input(s): GLUCAP in the last 168 hours. Lipid Profile: No results for input(s): CHOL, HDL, LDLCALC, TRIG, CHOLHDL, LDLDIRECT in the last 72 hours. Thyroid Function Tests: No results for input(s): TSH, T4TOTAL, FREET4, T3FREE, THYROIDAB in the last 72 hours. Anemia Panel: No results for input(s): VITAMINB12, FOLATE, FERRITIN, TIBC, IRON, RETICCTPCT in the last 72 hours. Sepsis Labs: No results  for input(s): PROCALCITON, LATICACIDVEN in the last 168 hours.  Recent Results (from the past 240 hour(s))  Culture, blood (routine x 2)     Status: None   Collection Time: 11/21/18  2:51 PM  Result Value Ref [place] Status   Specimen Description   Final    BLOOD LEFT ANTECUBITAL Performed at [facility] [address] [place], [place] [address]    Special Requests   Final    BOTTLES DRAWN AEROBIC AND ANAEROBIC Blood Culture adequate volume Performed at [facility] [address] [place], [place] [address]    Culture   Final    NO GROWTH 5 DAYS Performed at [facility], [place] [address], [place], [place] [address]    Report Status 11/26/2018 FINAL  Final  Culture, blood (routine x 2)     Status: None   Collection Time: 11/21/18  2:59 PM  Result Value Ref Range Status   Specimen Description   Final    BLOOD RIGHT ANTECUBITAL Performed at [facility] [address], [place], [place] [address]    Special Requests   Final    BOTTLES DRAWN AEROBIC ONLY Blood Culture adequate volume Performed at [facility] [address], [place], [place] [address]    Culture   Final    NO GROWTH 5 DAYS Performed at [facility], [place] [address], [place], [place] [address]    Report Status 11/26/2018 FINAL  Final  Culture, blood (routine x 2)     Status: Abnormal   Collection Time: 11/25/18  9:08 AM  Result Value Ref [place] Status   Specimen Description    Final    BLOOD RIGHT HAND Performed at [facility] [address], [place], [place] [address]    Special Requests   Final    BAA BCAV Performed at [facility] [address], [place], [place] [address]    Culture  Setup Time   Final    GRAM POSITIVE COCCI GRAM NEGATIVE RODS IN BOTH AEROBIC AND ANAEROBIC BOTTLES CRITICAL VALUE NOTED.  VALUE IS CONSISTENT WITH PREVIOUSLY REPORTED AND CALLED VALUE.    Culture (A)  Final    ESCHERICHIA COLI GROUP B STREP(S.AGALACTIAE)ISOLATED SUSCEPTIBILITIES PERFORMED ON PREVIOUS CULTURE WITHIN THE LAST 5 DAYS. Performed at [facility], [place] [address], [place], [place] [address]    Report Status 11/28/2018 FINAL  Final  Culture, blood (routine x 2)     Status: Abnormal   Collection Time: 11/25/18  9:08 AM  Result Value Ref [place] Status   Specimen Description   Final    BLOOD LEFT ARM Performed at [facility] [address], [place], [place] [address]    Special Requests   Final    BAA BCAV Performed at [facility] [address], [place], [place] [address]    Culture  Setup Time   Final    GRAM POSITIVE COCCI GRAM NEGATIVE RODS IN BOTH AEROBIC AND ANAEROBIC BOTTLES CRITICAL RESULT CALLED TO, READ BACK BY AND VERIFIED WITH: E GREEN PHARMD 11/26/18 0025 JDW Performed at [facility], [place] [address], [place],  [address]    Culture (A)  Final    ESCHERICHIA COLI GROUP B STREP(S.AGALACTIAE)ISOLATED    Report Status 11/28/2018 FINAL  Final   Organism ID, Bacteria ESCHERICHIA COLI  Final   Organism ID, Bacteria GROUP B STREP(S.AGALACTIAE)ISOLATED  Final  Susceptibility   Escherichia coli - MIC*    AMPICILLIN >=32 RESISTANT Resistant     CEFAZOLIN <=4 SENSITIVE Sensitive     CEFEPIME <=1 SENSITIVE Sensitive     CEFTAZIDIME <=1 SENSITIVE Sensitive     CEFTRIAXONE <=1 SENSITIVE Sensitive     CIPROFLOXACIN >=4 RESISTANT Resistant      GENTAMICIN <=1 SENSITIVE Sensitive     IMIPENEM <=0.25 SENSITIVE Sensitive     TRIMETH/SULFA <=20 SENSITIVE Sensitive     AMPICILLIN/SULBACTAM >=32 RESISTANT Resistant     PIP/TAZO <=4 SENSITIVE Sensitive     Extended ESBL NEGATIVE Sensitive     * ESCHERICHIA COLI   Group b strep(s.agalactiae)isolated - MIC*    CLINDAMYCIN >=1 RESISTANT Resistant     AMPICILLIN <=0.25 SENSITIVE Sensitive     ERYTHROMYCIN >=8 RESISTANT Resistant     VANCOMYCIN 0.5 SENSITIVE Sensitive     CEFTRIAXONE <=0.12 SENSITIVE Sensitive     LEVOFLOXACIN 1 SENSITIVE Sensitive     PENICILLIN SENSITIVE Sensitive     * GROUP B STREP(S.AGALACTIAE)ISOLATED  Blood Culture ID Panel (Reflexed)     Status: Abnormal   Collection Time: 11/25/18  9:08 AM  Result Value Ref Range Status   Enterococcus species NOT DETECTED NOT DETECTED Final   Listeria monocytogenes NOT DETECTED NOT DETECTED Final   Staphylococcus species NOT DETECTED NOT DETECTED Final   Staphylococcus aureus (BCID) NOT DETECTED NOT DETECTED Final   Streptococcus species DETECTED (A) NOT DETECTED Final    Comment: CRITICAL RESULT CALLED TO, READ BACK BY AND VERIFIED WITH: E GREEN PHARMD 11/26/18 0025 JDW    Streptococcus agalactiae DETECTED (A) NOT DETECTED Final    Comment: CRITICAL RESULT CALLED TO, READ BACK BY AND VERIFIED WITH: E GREEN PHARMD 11/26/18 0025 JDW    Streptococcus pneumoniae NOT DETECTED NOT DETECTED Final   Streptococcus pyogenes NOT DETECTED NOT DETECTED Final   Acinetobacter baumannii NOT DETECTED NOT DETECTED Final   Enterobacteriaceae species DETECTED (A) NOT DETECTED Final    Comment: Enterobacteriaceae represent a large family of gram-negative bacteria, not a single organism. CRITICAL RESULT CALLED TO, READ BACK BY AND VERIFIED WITH: E GREEN PHARMD 11/26/18 0025 JDW    Enterobacter cloacae complex NOT DETECTED NOT DETECTED Final   Escherichia coli DETECTED (A) NOT DETECTED Final    Comment: CRITICAL RESULT CALLED TO, READ BACK BY AND  VERIFIED WITH: E GREEN PHARMD 11/26/18 0025 JDW    Klebsiella oxytoca NOT DETECTED NOT DETECTED Final   Klebsiella pneumoniae NOT DETECTED NOT DETECTED Final   Proteus species NOT DETECTED NOT DETECTED Final   Serratia marcescens NOT DETECTED NOT DETECTED Final   Carbapenem resistance NOT DETECTED NOT DETECTED Final   Haemophilus influenzae NOT DETECTED NOT DETECTED Final   Neisseria meningitidis NOT DETECTED NOT DETECTED Final   Pseudomonas aeruginosa NOT DETECTED NOT DETECTED Final   Candida albicans NOT DETECTED NOT DETECTED Final   Candida glabrata NOT DETECTED NOT DETECTED Final   Candida krusei NOT DETECTED NOT DETECTED Final   Candida parapsilosis NOT DETECTED NOT DETECTED Final   Candida tropicalis NOT DETECTED NOT DETECTED Final    Comment: Performed at [facility], [place] [address] [place], [place] [address]  Fungus culture, blood     Status: None (Preliminary result)   Collection Time: 11/25/18  1:26 PM  Result Value Ref [place] Status   Specimen Description   Final    BLOOD Performed at [facility], [place] [address], [place],  [address]    Special  Requests   Final    NONE Performed at [facility], [place] [address], [place], [place] [address]    Culture   Final    NO GROWTH 6 DAYS Performed at [facility], [place] [address], [place], [place] [address]    Report Status PENDING  Incomplete  C difficile quick scan w PCR reflex     Status: Abnormal   Collection Time: 11/27/18  8:00 AM  Result Value Ref [place] Status   C Diff antigen POSITIVE (A) NEGATIVE Final   C Diff toxin NEGATIVE NEGATIVE Final   C Diff interpretation Results are indeterminate. See PCR results.  Final    Comment: Performed at [facility], [place] [address], [place], [place] [address]  C. Diff by PCR, Reflexed     Status: Abnormal   Collection Time: 11/27/18  8:00 AM  Result Value Ref [place] Status   Toxigenic C. Difficile by PCR POSITIVE (A)  NEGATIVE Final    Comment: Positive for toxigenic C. difficile with little to no toxin production. Only treat if clinical presentation suggests symptomatic illness. Performed at [facility], [place] [address] [place], [place] [address]   Culture, blood (Routine X 2) w Reflex to ID Panel     Status: None (Preliminary result)   Collection Time: 11/28/18 12:19 AM  Result Value Ref [place] Status   Specimen Description   Final    BLOOD RIGHT HAND Performed at [facility] [address] [place], [place] [address]    Special Requests   Final    BOTTLES DRAWN AEROBIC AND ANAEROBIC Blood Culture adequate volume Performed at [facility] [address] [place], [place] [address]    Culture   Final    NO GROWTH 3 DAYS Performed at [facility], [place] [address] [place], [place] [address]    Report Status PENDING  Incomplete  Culture, blood (Routine X 2) w Reflex to ID Panel     Status: None (Preliminary result)   Collection Time: 11/28/18 12:19 AM  Result Value Ref [place] Status   Specimen Description   Final    BLOOD LEFT HAND Performed at [facility] [address] [place], [place] [address]    Special Requests   Final    BOTTLES DRAWN AEROBIC AND ANAEROBIC Blood Culture adequate volume Performed at [facility] [address] [place], [place] [address]    Culture   Final    NO GROWTH 3 DAYS Performed at [facility], [place] [address] [place], [place] [address]    Report Status PENDING  Incomplete         Radiology Studies: US Abdomen Limited Ruq  Result Date: 11/29/2018 CLINICAL DATA:  Elevated total bilirubin = 5.2 EXAM: ULTRASOUND ABDOMEN LIMITED RIGHT UPPER QUADRANT COMPARISON:  CT abdomen and pelvis 11/27/2018 FINDINGS: Gallbladder: Normally distended without stones or wall thickening. No pericholecystic fluid or sonographic Murphy sign. Common bile duct: Diameter: 3 mm diameter,  normal Liver: Normal hepatic echogenicity. No mass or nodularity. No intrahepatic biliary dilatation. Portal vein is patent on color Doppler imaging with normal direction of blood flow towards the liver. No RIGHT upper quadrant free fluid. IMPRESSION: Normal exam. Electronically Signed   By: Lavonia Dana M.D.   On: 11/29/2018 14:34        Scheduled Meds: . Chlorhexidine Gluconate Cloth  6 each Topical Daily  . levothyroxine  37.5 mcg Intravenous  Daily  . mouth rinse  15 mL Mouth Rinse BID  . multivitamin with minerals  1 tablet Oral Daily  . nebivolol  10 mg Oral Daily  . nystatin  5 mL Oral QID  . scopolamine  1 patch Transdermal Q72H  . Tbo-filgastrim (GRANIX) SQ  480 mcg Subcutaneous q1800  . vancomycin  125 mg Oral QID   Continuous Infusions: . sodium chloride 1,000 mL (11/30/18 1420)  . sodium chloride    . cefTRIAXone (ROCEPHIN)  IV Stopped (11/30/18 2237)  . fluconazole (DIFLUCAN) IV Stopped (11/30/18 1523)     LOS: 20 days   Time spent= 40 mins    Palmyra Rogacki Arsenio Loader, MD [facility]  If 7PM-7AM, please contact night-coverage [email or web address] 12/01/2018, 10:22 AM

## 2018-12-02 DIAGNOSIS — R17 Unspecified jaundice: Secondary | ICD-10-CM

## 2018-12-02 LAB — CBC WITH DIFFERENTIAL/PLATELET
Abs Immature Granulocytes: 0.06 10*3/uL (ref 0.00–0.07)
Basophils Absolute: 0 10*3/uL (ref 0.0–0.1)
Basophils Relative: 2 %
Eosinophils Absolute: 0 10*3/uL (ref 0.0–0.5)
Eosinophils Relative: 0 %
HCT: 24.1 % — ABNORMAL LOW (ref 36.0–46.0)
Hemoglobin: 7.3 g/dL — ABNORMAL LOW (ref 12.0–15.0)
Immature Granulocytes: 6 %
LYMPHS PCT: 31 %
Lymphs Abs: 0.3 10*3/uL — ABNORMAL LOW (ref 0.7–4.0)
MCH: 27.5 pg (ref 26.0–34.0)
MCHC: 30.3 g/dL (ref 30.0–36.0)
MCV: 90.9 fL (ref 80.0–100.0)
Monocytes Absolute: 0.2 10*3/uL (ref 0.1–1.0)
Monocytes Relative: 19 %
NRBC: 3.2 % — AB (ref 0.0–0.2)
Neutro Abs: 0.4 10*3/uL — ABNORMAL LOW (ref 1.7–7.7)
Neutrophils Relative %: 42 %
Platelets: 91 10*3/uL — ABNORMAL LOW (ref 150–400)
RBC: 2.65 MIL/uL — ABNORMAL LOW (ref 3.87–5.11)
RDW: 17.5 % — AB (ref 11.5–15.5)
WBC: 0.9 10*3/uL — CL (ref 4.0–10.5)

## 2018-12-02 LAB — COMPREHENSIVE METABOLIC PANEL
ALT: 65 U/L — ABNORMAL HIGH (ref 0–44)
AST: 48 U/L — ABNORMAL HIGH (ref 15–41)
Albumin: 1.6 g/dL — ABNORMAL LOW (ref 3.5–5.0)
Alkaline Phosphatase: 99 U/L (ref 38–126)
Anion gap: 6 (ref 5–15)
BUN: 10 mg/dL (ref 6–20)
CHLORIDE: 116 mmol/L — AB (ref 98–111)
CO2: 25 mmol/L (ref 22–32)
Calcium: 8 mg/dL — ABNORMAL LOW (ref 8.9–10.3)
Creatinine, Ser: 0.5 mg/dL (ref 0.44–1.00)
GFR calc Af Amer: >60 mL/min (ref >60)
GFR calc non Af Amer: >60 mL/min (ref >60)
Glucose, Bld: 95 mg/dL (ref 70–99)
Potassium: 3.4 mmol/L — ABNORMAL LOW (ref 3.5–5.1)
Sodium: 147 mmol/L — ABNORMAL HIGH (ref 135–145)
Total Bilirubin: 5.9 mg/dL — ABNORMAL HIGH (ref 0.3–1.2)
Total Protein: 4.6 g/dL — ABNORMAL LOW (ref 6.5–8.1)

## 2018-12-02 LAB — FUNGUS CULTURE, BLOOD: Culture: NO GROWTH

## 2018-12-02 LAB — PHOSPHORUS: PHOSPHORUS: 3.2 mg/dL (ref 2.5–4.6)

## 2018-12-02 LAB — PREPARE RBC (CROSSMATCH)

## 2018-12-02 LAB — MAGNESIUM: Magnesium: 1.8 mg/dL (ref 1.7–2.4)

## 2018-12-02 MED ORDER — SODIUM CHLORIDE 0.9% IV SOLUTION
Freq: Once | INTRAVENOUS | Status: AC
  Administered 2018-12-02: 13:00:00 via INTRAVENOUS

## 2018-12-02 MED ORDER — POTASSIUM CHLORIDE CRYS ER 20 MEQ PO TBCR
40.0000 meq | EXTENDED_RELEASE_TABLET | Freq: Once | ORAL | Status: AC
  Administered 2018-12-02: 40 meq via ORAL
  Filled 2018-12-02: qty 2

## 2018-12-02 MED ORDER — ACETAMINOPHEN 325 MG PO TABS
650.0000 mg | ORAL_TABLET | Freq: Once | ORAL | Status: AC
  Administered 2018-12-02: 650 mg via ORAL
  Filled 2018-12-02: qty 2

## 2018-12-02 MED ORDER — DIPHENHYDRAMINE HCL 25 MG PO CAPS
25.0000 mg | ORAL_CAPSULE | Freq: Once | ORAL | Status: AC
  Administered 2018-12-02: 25 mg via ORAL
  Filled 2018-12-02: qty 1

## 2018-12-02 NOTE — Progress Notes (Addendum)
[facility] INPATIENT PROGRESS NOTE  SUBJECTIVE: Cindy Ward 54 y.o. female with Stage IV Hodgkin lymphoma. Received Day 1 Cycle 1 AVD+ Adcedrison 11/19/2018.  Remains afebrile. Repeat blood cultures drawn on 11/28/2018 are negative to date.  Remains on Ceftriaxone. On fluconazole and Nystatin for thrush.  Having less pain with swallowing. Denies chest pain but has shortness of breath with exertion.  Abdominal pain is resolved.  Stools are now formed. Denies bleeding.  ALLERGIES:  has No Known Allergies.  MEDICATIONS:  Current Facility-Administered Medications  Medication Dose Route Frequency Provider Last Rate Last Dose  . 0.9 %  sodium chloride infusion   Intravenous PRN Rai, Ripudeep K, MD 10 mL/hr at 11/30/18 1420 1,000 mL at 11/30/18 1420  . 0.9 %  sodium chloride infusion   Intravenous Once Rai, Ripudeep K, MD      . acetaminophen (TYLENOL) tablet 650 mg  650 mg Oral Q6H PRN Rai, Ripudeep K, MD   650 mg at 11/25/18 0811   Or  . acetaminophen (TYLENOL) suppository 650 mg  650 mg Rectal Q6H PRN Rai, Ripudeep K, MD   650 mg at 11/25/18 1509  . cefTRIAXone (ROCEPHIN) 2 g in sodium chloride 0.9 % 100 mL IVPB  2 g Intravenous QHS Dorrene German, RPH 200 mL/hr at 12/01/18 2259 2 g at 12/01/18 2259  . Chlorhexidine Gluconate Cloth 2 % PADS 6 each  6 each Topical Daily Rai, Ripudeep K, MD   6 each at 11/30/18 1018  . feeding supplement (BOOST / RESOURCE BREEZE) liquid 1 Container  1 Container Oral Daily PRN Rai, Ripudeep K, MD      . fluconazole (DIFLUCAN) IVPB 200 mg  200 mg Intravenous Q24H Rai, Ripudeep K, MD 100 mL/hr at 12/01/18 1323 200 mg at 12/01/18 1323  . HYDROmorphone (DILAUDID) injection 0.5-1 mg  0.5-1 mg Intravenous Q4H PRN Rai, Ripudeep K, MD   1 mg at 11/26/18 1058  . ibuprofen (ADVIL,MOTRIN) tablet 400 mg  400 mg Oral Q8H PRN Rai, Ripudeep K, MD   400 mg at 11/18/18 2334  . iohexol (OMNIPAQUE) 300 MG/ML solution 15 mL  15 mL Oral Once PRN Rai, Ripudeep K, MD    30 mL at 11/18/18 1740  . ipratropium-albuterol (DUONEB) 0.5-2.5 (3) MG/3ML nebulizer solution 3 mL  3 mL Nebulization Q4H PRN Amin, Ankit Chirag, MD      . levothyroxine (SYNTHROID, LEVOTHROID) injection 37.5 mcg  37.5 mcg Intravenous Daily Rai, Ripudeep K, MD   37.5 mcg at 12/01/18 0943  . lip balm (CARMEX) ointment   Topical PRN Rai, Ripudeep K, MD      . MEDLINE mouth rinse  15 mL Mouth Rinse BID Rai, Ripudeep K, MD   15 mL at 12/01/18 2320  . multivitamin with minerals tablet 1 tablet  1 tablet Oral Daily Amin, Ankit Chirag, MD   1 tablet at 11/30/18 0927  . nebivolol (BYSTOLIC) tablet 10 mg  10 mg Oral Daily Amin, Ankit Chirag, MD   10 mg at 12/01/18 0943  . nystatin (MYCOSTATIN) 100000 UNIT/ML suspension 500,000 Units  5 mL Oral QID Rai, Ripudeep K, MD   500,000 Units at 12/01/18 2320  . ondansetron (ZOFRAN) tablet 4 mg  4 mg Oral Q6H PRN Rai, Ripudeep K, MD       Or  . ondansetron (ZOFRAN) injection 4 mg  4 mg Intravenous Q6H PRN Rai, Ripudeep K, MD   4 mg at 11/28/18 1740  . oxyCODONE (Oxy IR/ROXICODONE) immediate release tablet  5-10 mg  5-10 mg Oral Q4H PRN Rai, Ripudeep K, MD   5 mg at 11/28/18 1739  . phenol (CHLORASEPTIC) mouth spray 1 spray  1 spray Mouth/Throat PRN Rai, Ripudeep K, MD      . phytonadione (VITAMIN K) SQ injection 10 mg  10 mg Subcutaneous Daily Levin Erp, [place]   10 mg at 12/01/18 1322  . protein supplement (UNJURY CHICKEN SOUP) powder 8 oz  8 oz Oral BID PRN Rai, Ripudeep K, MD      . scopolamine (TRANSDERM-SCOP) 1 MG/3DAYS 1.5 mg  1 patch Transdermal Q72H Rai, Ripudeep K, MD   1.5 mg at 11/30/18 1428  . sodium chloride flush (NS) 0.9 % injection 10-40 mL  10-40 mL Intracatheter PRN Rai, Ripudeep K, MD      . Tbo-Filgrastim [facility]) injection 480 mcg  480 mcg Subcutaneous q1800 Tish Men, MD   480 mcg at 12/01/18 1825  . vancomycin (VANCOCIN) 50 mg/mL oral solution 125 mg  125 mg Oral QID Maryanna Shape, NP   125 mg at 12/01/18 2321    REVIEW OF  SYSTEMS:   Review of Systems  Review of Systems  Constitutional: Positive for malaise/fatigue and weight loss. Negative for chills and fever.  HENT: Negative for sore throat.   Eyes: Negative.   Respiratory:       Reports shortness of breath with exertion.  Cardiovascular: Negative for chest pain.  Gastrointestinal: Negative for abdominal pain, diarrhea, nausea and vomiting.  Genitourinary: Negative.   Musculoskeletal: Negative.   Skin: Negative.   Neurological: Negative.   Psychiatric/Behavioral: Negative.       PHYSICAL EXAMINATION:  Vital signs in last 24 hours: Temp:  [98.1 F (36.7 C)-98.7 F (37.1 C)] 98.1 F (36.7 C) (03/09 0513) Pulse Rate:  [95-99] 95 (03/09 0513) Resp:  [20-22] 20 (03/09 0513) BP: (95-119)/(60-70) 116/70 (03/09 0513) SpO2:  [95 %-97 %] 97 % (03/09 0513) Weight change:  Last BM Date: 11/30/18  Intake/Output from previous day: 03/08 0701 - 03/09 0700 In: 200 [P.O.:100; IV Piggyback:100] Out: 600 [Urine:600] General: Alert, awake without distress.  More talkative this morning.  Chronically ill appearing.  Head: Normocephalic atraumatic. Mouth: mucus membranes dry.  Thrush resolved. Eyes: No scleral icterus.  Pupils are equal and round reactive to light. Resp: clear to auscultation bilaterally without rhonchi or wheezes or dullness to percussion. Cardio: Regular rate and rhythm, S1, S2 normal, no murmur, click, rub or gallop.  GI: soft, no tenderness.  bowel sounds normal; no masses,  no organomegaly Musculoskeletal: No joint deformity or effusion. Neurological: No motor, sensory deficits.  Skin: No rashes or lesions.  Portacath/PICC-without erythema  LABORATORY DATA: Lab Results  Component Value Date   WBC 0.9 (LL) 12/02/2018   HGB 7.3 (L) 12/02/2018   HCT 24.1 (L) 12/02/2018   MCV 90.9 12/02/2018   PLT 91 (L) 12/02/2018    CMP Latest Ref Rng & Units 12/02/2018 12/01/2018 11/30/2018  Glucose 70 - 99 mg/dL 95 98 120(H)  BUN 6 - 20 mg/dL '10 8  8  ' Creatinine 0.44 - 1.00 mg/dL 0.50 0.47 0.49  Sodium 135 - 145 mmol/L 147(H) 143 141  Potassium 3.5 - 5.1 mmol/L 3.4(L) 3.5 3.3(L)  Chloride 98 - 111 mmol/L 116(H) 113(H) 112(H)  CO2 22 - 32 mmol/L '25 23 22  ' Calcium 8.9 - 10.3 mg/dL 8.0(L) 7.5(L) 6.4(LL)  Total Protein 6.5 - 8.1 g/dL 4.6(L) 4.7(L) 4.6(L)  Total Bilirubin 0.3 - 1.2 mg/dL 5.9(H) 6.7(H) 5.6(H)  Alkaline  Phos 38 - 126 U/L 99 97 91  AST 15 - 41 U/L 48(H) 50(H) 39  ALT 0 - 44 U/L 65(H) 64(H) 56(H)     RADIOGRAPHIC STUDIES:  Dg Chest 1 View  Result Date: 11/26/2018 CLINICAL DATA:  Tachypnea.  Stage IV Hodgkin's lymphoma. EXAM: CHEST  1 VIEW COMPARISON:  Chest radiograph November 25, 2018 FINDINGS: Cardiac silhouette is mildly enlarged and unchanged. Pulmonary vascular congestion with small LEFT pleural effusion. No pneumothorax. Strandy densities LEFT lung base. Surgical clips LEFT axilla. RIGHT single-lumen chest Port-A-Cath distal tip projecting mid superior vena cava. Osseous structures are unchanged. IMPRESSION: Stable cardiomegaly and pulmonary vascular congestion. Small LEFT pleural effusion and LEFT lung base atelectasis. Electronically Signed   By: Elon Alas M.D.   On: 11/26/2018 20:32   Dg Chest 2 View  Result Date: 11/21/2018 CLINICAL DATA:  History of lymphoma.  Patient weak and flushed. EXAM: CHEST - 2 VIEW COMPARISON:  Chest CT, 11/18/2018.  Chest radiographs, 11/11/2018. FINDINGS: Mild enlargement of the cardiopericardial silhouette. No mediastinal or hilar masses. No convincing adenopathy. Small bilateral pleural effusions. Mild dependent lower lobe atelectasis. Lungs otherwise clear. No pneumothorax. Right anterior chest wall, internal jugular, Port-A-Cath is stable from the prior chest CT. Skeletal structures are unremarkable. IMPRESSION: 1. Findings are similar to the recent prior chest CT. Enlargement of the cardiopericardial silhouette is consistent with the pericardial effusion noted on CT. There are small  bilateral pleural effusions with associated lower lobe atelectasis. No convincing pneumonia and no pulmonary edema. Electronically Signed   By: Lajean Manes M.D.   On: 11/21/2018 15:05   Ct Head Wo Contrast  Result Date: 11/25/2018 CLINICAL DATA:  Hodgkin's lymphoma, fevers, headache EXAM: CT HEAD WITHOUT CONTRAST TECHNIQUE: Contiguous axial images were obtained from the base of the skull through the vertex without intravenous contrast. COMPARISON:  11/25/2018 FINDINGS: Brain: No evidence of acute infarction, hemorrhage, hydrocephalus, extra-axial collection or mass lesion/mass effect. Mild periventricular white matter hypodensity. Vascular: No hyperdense vessel or unexpected calcification. Skull: Normal. Negative for fracture or focal lesion. Sinuses/Orbits: No acute finding. Other: None. IMPRESSION: No acute intracranial pathology. No non-contrast CT findings to explain headache. Mild small-vessel white matter disease. Electronically Signed   By: Eddie Candle M.D.   On: 11/25/2018 12:37   Ct Soft Tissue Neck W Contrast  Result Date: 11/18/2018 CLINICAL DATA:  Initial evaluation for Hodgkin's lymphoma, initial workup. EXAM: CT NECK WITH CONTRAST CT CHEST, ABDOMEN, AND PELVIS WITH CONTRAST TECHNIQUE: Multidetector CT imaging of the chest, abdomen and pelvis was performed following the standard protocol during bolus administration of intravenous contrast. CONTRAST:  183m OMNIPAQUE IOHEXOL 300 MG/ML SOLN, 369mOMNIPAQUE IOHEXOL 300 MG/ML SOLN COMPARISON:  None. FINDINGS: CT NECK FINDINGS: Pharynx and larynx: Oral cavity within normal limits without mass lesion or loculated collection. Patient is largely edentulous. Calcified tonsilliths noted within the right palatine tonsil. Tonsils themselves symmetric and within normal limits bilaterally. Parapharyngeal fat maintained. Nasopharynx normal. No retropharyngeal collection. Epiglottis normal. Vallecula clear. Remainder of the hypopharynx and supraglottic larynx  normal. Glottis within normal limits. Subglottic airway clear. Salivary glands: Parotid and submandibular glands within normal limits. Thyroid: Thyroid diffusely enlarged without discrete nodule or mass. Lymph nodes: Mildly prominent level II lymph nodes measure up to 9 mm in short axis bilaterally. Left level III nodes measure up to 9 mm as well. Increased number of shotty subcentimeter nodes within the neck bilaterally, left slightly worse than right. No other pathologically enlarged lymph nodes identified within the neck. Vascular:  Normal intravascular enhancement seen throughout the neck. Right IJ approach central venous catheter in place. Soft tissue swelling with stranding and scattered foci of soft tissue emphysema within the right neck likely related to central line placement. Limited intracranial: Unremarkable Visualized orbits: Partially visualized inferior globes and orbits unremarkable. Mastoids and visualized paranasal sinuses: Mucosal thickening noted within the visualized maxillary and sphenoid sinuses. Visualized paranasal sinuses are otherwise clear. Visualize mastoids and middle ear cavities are clear. Skeleton: No acute osseous abnormality. No discrete lytic or blastic osseous lesions. Mild cervical spondylolysis present at C5-6. Other: None. CT CHEST FINDINGS Cardiovascular: Normal intravascular enhancement seen throughout the intra-abdominal aorta without aneurysm or other acute abnormality. Minimal atheromatous plaque within the aortic arch. Visualized great vessels within normal limits. Heart size normal. Small moderate pericardial effusion, simple fluid density. Mediastinum/Nodes: Enlarged nodes within the upper mediastinum measure up to 15 mm in short axis (series 3, image 11). No appreciable supraclavicular adenopathy. No other pathologically enlarged mediastinal lymph nodes. Mild soft tissue density within the right hilum without frank adenopathy. Mildly enlarged 11 mm left hilar node  (series 3, image 28). Enlarged axillary adenopathy seen bilaterally, with the largest node on the left measuring 2.1 cm in short axis (series 3, image 16). Largest node on the right measures 1.7 cm in short axis (series 3, image 8). Scattered soft tissue stranding with emphysema and fluid density overlying the left axilla likely related to recent lymph node biopsy, partially visualized. Esophagus within normal limits. Lungs/Pleura: Tracheobronchial tree intact and patent. Moderate layering bilateral pleural effusions with associated atelectasis. No other airspace consolidation. No pulmonary edema. No pneumothorax. 4 mm subpleural ground-glass nodule present at the anterior right upper lobe (series 4, image 57), indeterminate. No other worrisome pulmonary nodule or mass. Musculoskeletal: No acute osseous finding. No discrete lytic or blastic osseous lesions. CT ABDOMEN PELVIS FINDINGS Hepatobiliary: Liver demonstrates a normal contrast enhanced appearance. Gallbladder within normal limits. No biliary dilatation. Pancreas: Pancreas within normal limits. Spleen: Spleen enlarged measuring 14.3 cm in craniocaudad dimension. Few subtle hypodensities noted within the spleen, largest of which measures approximately 12 mm (series 3, image 57), indeterminate. Adrenals/Urinary Tract: Adrenal glands are normal. Kidneys equal in size with symmetric enhancement. No nephrolithiasis, hydronephrosis, or focal enhancing renal mass. No hydroureter. Bladder within normal limits. Stomach/Bowel: Stomach within normal limits. No evidence for bowel obstruction. Normal appendix. No acute inflammatory changes seen about the bowels. Vascular/Lymphatic: Normal intravascular enhancement seen throughout the intra-abdominal aorta. Mild aorto bi-iliac atherosclerotic disease. No aneurysm. Mesenteric vessels patent proximally. Enlarged 15 mm aortocaval lymph node (series 3, image 7). Additional multifocal shotty subcentimeter aortocaval and  periaortic lymph nodes noted. No other pathologically enlarged intra-abdominal lymph nodes identified. Mildly prominent 12 mm left inguinal lymph nodes noted. No other adenopathy within the pelvis. Reproductive: Uterus within normal limits.  Ovaries normal. Other: Moderate volume free fluid within the pelvis, measuring simple fluid density. No free intraperitoneal air. Musculoskeletal: Scattered anasarca noted within the external soft tissues. No acute osseous finding. No discrete lytic or blastic osseous lesions. IMPRESSION: CT NECK IMPRESSION 1. Increased number of shoddy subcentimeter lymph nodes throughout the neck bilaterally, left slightly worse than right. No pathologically enlarged lymph nodes identified. 2. Mild diffuse thyromegaly without discrete thyroid nodule or mass. 3. Mild soft tissue stranding with emphysema within the right lateral neck related to recent right-sided Port-A-Cath placement. CT CHEST IMPRESSION 1. Enlarged bilateral axillary and upper mediastinal adenopathy as above, likely related to provided history of lymphoma. Additional borderline enlarged left  hilar node may be related to lymphoma or possibly reactive in nature. Attention at follow-up recommended. 2. Moderate layering bilateral pleural effusions with associated atelectasis. 3. Small to moderate pericardial effusion. 4. 4 mm right upper lobe ground-glass nodule, indeterminate. Attention at follow-up recommended. CT ABDOMEN AND PELVIS IMPRESSION 1. Enlarged 15 mm aortocaval lymph node with mildly enlarged 12 mm left inguinal lymph nodes. Additional increased number of shotty subcentimeter retroperitoneal lymph nodes. Findings most likely related to history of lymphoma. 2. Splenomegaly. 3. Moderate volume free fluid within the pelvis, of uncertain etiology, but could be physiologic and/or related overall volume status. 4. Mild diffuse anasarca. Electronically Signed   By: Jeannine Boga M.D.   On: 11/18/2018 19:56   Ct  Chest W Contrast  Result Date: 11/18/2018 CLINICAL DATA:  Initial evaluation for Hodgkin's lymphoma, initial workup. EXAM: CT NECK WITH CONTRAST CT CHEST, ABDOMEN, AND PELVIS WITH CONTRAST TECHNIQUE: Multidetector CT imaging of the chest, abdomen and pelvis was performed following the standard protocol during bolus administration of intravenous contrast. CONTRAST:  126m OMNIPAQUE IOHEXOL 300 MG/ML SOLN, 375mOMNIPAQUE IOHEXOL 300 MG/ML SOLN COMPARISON:  None. FINDINGS: CT NECK FINDINGS: Pharynx and larynx: Oral cavity within normal limits without mass lesion or loculated collection. Patient is largely edentulous. Calcified tonsilliths noted within the right palatine tonsil. Tonsils themselves symmetric and within normal limits bilaterally. Parapharyngeal fat maintained. Nasopharynx normal. No retropharyngeal collection. Epiglottis normal. Vallecula clear. Remainder of the hypopharynx and supraglottic larynx normal. Glottis within normal limits. Subglottic airway clear. Salivary glands: Parotid and submandibular glands within normal limits. Thyroid: Thyroid diffusely enlarged without discrete nodule or mass. Lymph nodes: Mildly prominent level II lymph nodes measure up to 9 mm in short axis bilaterally. Left level III nodes measure up to 9 mm as well. Increased number of shotty subcentimeter nodes within the neck bilaterally, left slightly worse than right. No other pathologically enlarged lymph nodes identified within the neck. Vascular: Normal intravascular enhancement seen throughout the neck. Right IJ approach central venous catheter in place. Soft tissue swelling with stranding and scattered foci of soft tissue emphysema within the right neck likely related to central line placement. Limited intracranial: Unremarkable Visualized orbits: Partially visualized inferior globes and orbits unremarkable. Mastoids and visualized paranasal sinuses: Mucosal thickening noted within the visualized maxillary and sphenoid  sinuses. Visualized paranasal sinuses are otherwise clear. Visualize mastoids and middle ear cavities are clear. Skeleton: No acute osseous abnormality. No discrete lytic or blastic osseous lesions. Mild cervical spondylolysis present at C5-6. Other: None. CT CHEST FINDINGS Cardiovascular: Normal intravascular enhancement seen throughout the intra-abdominal aorta without aneurysm or other acute abnormality. Minimal atheromatous plaque within the aortic arch. Visualized great vessels within normal limits. Heart size normal. Small moderate pericardial effusion, simple fluid density. Mediastinum/Nodes: Enlarged nodes within the upper mediastinum measure up to 15 mm in short axis (series 3, image 11). No appreciable supraclavicular adenopathy. No other pathologically enlarged mediastinal lymph nodes. Mild soft tissue density within the right hilum without frank adenopathy. Mildly enlarged 11 mm left hilar node (series 3, image 28). Enlarged axillary adenopathy seen bilaterally, with the largest node on the left measuring 2.1 cm in short axis (series 3, image 16). Largest node on the right measures 1.7 cm in short axis (series 3, image 8). Scattered soft tissue stranding with emphysema and fluid density overlying the left axilla likely related to recent lymph node biopsy, partially visualized. Esophagus within normal limits. Lungs/Pleura: Tracheobronchial tree intact and patent. Moderate layering bilateral pleural effusions with associated atelectasis.  No other airspace consolidation. No pulmonary edema. No pneumothorax. 4 mm subpleural ground-glass nodule present at the anterior right upper lobe (series 4, image 57), indeterminate. No other worrisome pulmonary nodule or mass. Musculoskeletal: No acute osseous finding. No discrete lytic or blastic osseous lesions. CT ABDOMEN PELVIS FINDINGS Hepatobiliary: Liver demonstrates a normal contrast enhanced appearance. Gallbladder within normal limits. No biliary dilatation.  Pancreas: Pancreas within normal limits. Spleen: Spleen enlarged measuring 14.3 cm in craniocaudad dimension. Few subtle hypodensities noted within the spleen, largest of which measures approximately 12 mm (series 3, image 57), indeterminate. Adrenals/Urinary Tract: Adrenal glands are normal. Kidneys equal in size with symmetric enhancement. No nephrolithiasis, hydronephrosis, or focal enhancing renal mass. No hydroureter. Bladder within normal limits. Stomach/Bowel: Stomach within normal limits. No evidence for bowel obstruction. Normal appendix. No acute inflammatory changes seen about the bowels. Vascular/Lymphatic: Normal intravascular enhancement seen throughout the intra-abdominal aorta. Mild aorto bi-iliac atherosclerotic disease. No aneurysm. Mesenteric vessels patent proximally. Enlarged 15 mm aortocaval lymph node (series 3, image 7). Additional multifocal shotty subcentimeter aortocaval and periaortic lymph nodes noted. No other pathologically enlarged intra-abdominal lymph nodes identified. Mildly prominent 12 mm left inguinal lymph nodes noted. No other adenopathy within the pelvis. Reproductive: Uterus within normal limits.  Ovaries normal. Other: Moderate volume free fluid within the pelvis, measuring simple fluid density. No free intraperitoneal air. Musculoskeletal: Scattered anasarca noted within the external soft tissues. No acute osseous finding. No discrete lytic or blastic osseous lesions. IMPRESSION: CT NECK IMPRESSION 1. Increased number of shoddy subcentimeter lymph nodes throughout the neck bilaterally, left slightly worse than right. No pathologically enlarged lymph nodes identified. 2. Mild diffuse thyromegaly without discrete thyroid nodule or mass. 3. Mild soft tissue stranding with emphysema within the right lateral neck related to recent right-sided Port-A-Cath placement. CT CHEST IMPRESSION 1. Enlarged bilateral axillary and upper mediastinal adenopathy as above, likely related to  provided history of lymphoma. Additional borderline enlarged left hilar node may be related to lymphoma or possibly reactive in nature. Attention at follow-up recommended. 2. Moderate layering bilateral pleural effusions with associated atelectasis. 3. Small to moderate pericardial effusion. 4. 4 mm right upper lobe ground-glass nodule, indeterminate. Attention at follow-up recommended. CT ABDOMEN AND PELVIS IMPRESSION 1. Enlarged 15 mm aortocaval lymph node with mildly enlarged 12 mm left inguinal lymph nodes. Additional increased number of shotty subcentimeter retroperitoneal lymph nodes. Findings most likely related to history of lymphoma. 2. Splenomegaly. 3. Moderate volume free fluid within the pelvis, of uncertain etiology, but could be physiologic and/or related overall volume status. 4. Mild diffuse anasarca. Electronically Signed   By: Jeannine Boga M.D.   On: 11/18/2018 19:56   Ct Abdomen Pelvis W Contrast  Result Date: 11/27/2018 CLINICAL DATA:  Inpatient. Non-Hodgkin lymphoma. Ongoing chemotherapy. Abdominal pain. Rising bilirubin. EXAM: CT ABDOMEN AND PELVIS WITH CONTRAST TECHNIQUE: Multidetector CT imaging of the abdomen and pelvis was performed using the standard protocol following bolus administration of intravenous contrast. CONTRAST:  146m OMNIPAQUE IOHEXOL 300 MG/ML  SOLN COMPARISON:  11/18/2018 CT chest, abdomen and pelvis. FINDINGS: Lower chest: Small dependent bilateral pleural effusions with passive dependent bibasilar atelectasis, unchanged. Small pericardial effusion appears stable. Hepatobiliary: Normal liver size. No liver mass. Normal gallbladder with no radiopaque cholelithiasis. No biliary ductal dilatation. Pancreas: Normal, with no mass or duct dilation. Spleen: Spleen has decreased and is now normal in size. Craniocaudal splenic length 11.4 cm, previously 13.3 cm. No splenic mass. Adrenals/Urinary Tract: Normal adrenals. Mild bilateral hydroureteronephrosis to the level of  the  ureterovesical junction. Symmetric mildly delayed contrast nephrograms bilaterally. New vague small low-attenuation 1.6 cm renal cortical focus in the lateral upper left kidney (series 7/image 12). Otherwise no renal lesions. Markedly distended and otherwise normal bladder. Stomach/Bowel: Normal non-distended stomach. Normal caliber small bowel with no small bowel wall thickening. Normal appendix. Normal large bowel with no diverticulosis, large bowel wall thickening or pericolonic fat stranding. Vascular/Lymphatic: Atherosclerotic nonaneurysmal abdominal aorta. Patent portal, splenic, hepatic and renal veins. Left inguinal lymphadenopathy is decreased, for example previously 1.2 cm, now 0.9 cm (series 2/image 84). Retroperitoneal adenopathy in aortocaval and left para-aortic chains has decreased. Representative 0.8 cm aortocaval node (series 2/image 31), previously 1.2 cm. Representative 0.8 cm left para-aortic node (series 2/image 30), previously 1.2 cm. No pathologically enlarged abdominopelvic nodes. Reproductive: Grossly normal uterus.  No adnexal mass. Other: Trace free fluid in pelvic cul-de-sac. No focal fluid collection. No pneumoperitoneum. Mild anasarca, similar. Musculoskeletal: No aggressive appearing focal osseous lesions. Mild lumbar spondylosis. IMPRESSION: 1. Acute urinary retention with massively distended urinary bladder and mild bilateral hydroureteronephrosis. Recommend urgent bladder catheterization. 2. Evidence of treatment response. Spleen is decreased and now normal in size. Retroperitoneal and left inguinal lymphadenopathy is decreased, with no residual pathologically enlarged abdominopelvic nodes. 3. Small dependent bilateral pleural effusions with passive dependent bibasilar atelectasis, stable. 4. Stable small pericardial effusion. 5. Aortic Atherosclerosis (ICD10-I70.0). These results were called by telephone at the time of interpretation on 11/27/2018 at 11:07 am to [place],  who verbally acknowledged these results. Per the RN, a bladder catheterization was just completed with 2075 cc of urine drained. Electronically Signed   By: Ilona Sorrel M.D.   On: 11/27/2018 11:31   Ct Abdomen Pelvis W Contrast  Result Date: 11/18/2018 CLINICAL DATA:  Initial evaluation for Hodgkin's lymphoma, initial workup. EXAM: CT NECK WITH CONTRAST CT CHEST, ABDOMEN, AND PELVIS WITH CONTRAST TECHNIQUE: Multidetector CT imaging of the chest, abdomen and pelvis was performed following the standard protocol during bolus administration of intravenous contrast. CONTRAST:  163m OMNIPAQUE IOHEXOL 300 MG/ML SOLN, 332mOMNIPAQUE IOHEXOL 300 MG/ML SOLN COMPARISON:  None. FINDINGS: CT NECK FINDINGS: Pharynx and larynx: Oral cavity within normal limits without mass lesion or loculated collection. Patient is largely edentulous. Calcified tonsilliths noted within the right palatine tonsil. Tonsils themselves symmetric and within normal limits bilaterally. Parapharyngeal fat maintained. Nasopharynx normal. No retropharyngeal collection. Epiglottis normal. Vallecula clear. Remainder of the hypopharynx and supraglottic larynx normal. Glottis within normal limits. Subglottic airway clear. Salivary glands: Parotid and submandibular glands within normal limits. Thyroid: Thyroid diffusely enlarged without discrete nodule or mass. Lymph nodes: Mildly prominent level II lymph nodes measure up to 9 mm in short axis bilaterally. Left level III nodes measure up to 9 mm as well. Increased number of shotty subcentimeter nodes within the neck bilaterally, left slightly worse than right. No other pathologically enlarged lymph nodes identified within the neck. Vascular: Normal intravascular enhancement seen throughout the neck. Right IJ approach central venous catheter in place. Soft tissue swelling with stranding and scattered foci of soft tissue emphysema within the right neck likely related to central line placement. Limited  intracranial: Unremarkable Visualized orbits: Partially visualized inferior globes and orbits unremarkable. Mastoids and visualized paranasal sinuses: Mucosal thickening noted within the visualized maxillary and sphenoid sinuses. Visualized paranasal sinuses are otherwise clear. Visualize mastoids and middle ear cavities are clear. Skeleton: No acute osseous abnormality. No discrete lytic or blastic osseous lesions. Mild cervical spondylolysis present at C5-6. Other: None. CT CHEST FINDINGS Cardiovascular: Normal  intravascular enhancement seen throughout the intra-abdominal aorta without aneurysm or other acute abnormality. Minimal atheromatous plaque within the aortic arch. Visualized great vessels within normal limits. Heart size normal. Small moderate pericardial effusion, simple fluid density. Mediastinum/Nodes: Enlarged nodes within the upper mediastinum measure up to 15 mm in short axis (series 3, image 11). No appreciable supraclavicular adenopathy. No other pathologically enlarged mediastinal lymph nodes. Mild soft tissue density within the right hilum without frank adenopathy. Mildly enlarged 11 mm left hilar node (series 3, image 28). Enlarged axillary adenopathy seen bilaterally, with the largest node on the left measuring 2.1 cm in short axis (series 3, image 16). Largest node on the right measures 1.7 cm in short axis (series 3, image 8). Scattered soft tissue stranding with emphysema and fluid density overlying the left axilla likely related to recent lymph node biopsy, partially visualized. Esophagus within normal limits. Lungs/Pleura: Tracheobronchial tree intact and patent. Moderate layering bilateral pleural effusions with associated atelectasis. No other airspace consolidation. No pulmonary edema. No pneumothorax. 4 mm subpleural ground-glass nodule present at the anterior right upper lobe (series 4, image 57), indeterminate. No other worrisome pulmonary nodule or mass. Musculoskeletal: No acute  osseous finding. No discrete lytic or blastic osseous lesions. CT ABDOMEN PELVIS FINDINGS Hepatobiliary: Liver demonstrates a normal contrast enhanced appearance. Gallbladder within normal limits. No biliary dilatation. Pancreas: Pancreas within normal limits. Spleen: Spleen enlarged measuring 14.3 cm in craniocaudad dimension. Few subtle hypodensities noted within the spleen, largest of which measures approximately 12 mm (series 3, image 57), indeterminate. Adrenals/Urinary Tract: Adrenal glands are normal. Kidneys equal in size with symmetric enhancement. No nephrolithiasis, hydronephrosis, or focal enhancing renal mass. No hydroureter. Bladder within normal limits. Stomach/Bowel: Stomach within normal limits. No evidence for bowel obstruction. Normal appendix. No acute inflammatory changes seen about the bowels. Vascular/Lymphatic: Normal intravascular enhancement seen throughout the intra-abdominal aorta. Mild aorto bi-iliac atherosclerotic disease. No aneurysm. Mesenteric vessels patent proximally. Enlarged 15 mm aortocaval lymph node (series 3, image 7). Additional multifocal shotty subcentimeter aortocaval and periaortic lymph nodes noted. No other pathologically enlarged intra-abdominal lymph nodes identified. Mildly prominent 12 mm left inguinal lymph nodes noted. No other adenopathy within the pelvis. Reproductive: Uterus within normal limits.  Ovaries normal. Other: Moderate volume free fluid within the pelvis, measuring simple fluid density. No free intraperitoneal air. Musculoskeletal: Scattered anasarca noted within the external soft tissues. No acute osseous finding. No discrete lytic or blastic osseous lesions. IMPRESSION: CT NECK IMPRESSION 1. Increased number of shoddy subcentimeter lymph nodes throughout the neck bilaterally, left slightly worse than right. No pathologically enlarged lymph nodes identified. 2. Mild diffuse thyromegaly without discrete thyroid nodule or mass. 3. Mild soft tissue  stranding with emphysema within the right lateral neck related to recent right-sided Port-A-Cath placement. CT CHEST IMPRESSION 1. Enlarged bilateral axillary and upper mediastinal adenopathy as above, likely related to provided history of lymphoma. Additional borderline enlarged left hilar node may be related to lymphoma or possibly reactive in nature. Attention at follow-up recommended. 2. Moderate layering bilateral pleural effusions with associated atelectasis. 3. Small to moderate pericardial effusion. 4. 4 mm right upper lobe ground-glass nodule, indeterminate. Attention at follow-up recommended. CT ABDOMEN AND PELVIS IMPRESSION 1. Enlarged 15 mm aortocaval lymph node with mildly enlarged 12 mm left inguinal lymph nodes. Additional increased number of shotty subcentimeter retroperitoneal lymph nodes. Findings most likely related to history of lymphoma. 2. Splenomegaly. 3. Moderate volume free fluid within the pelvis, of uncertain etiology, but could be physiologic and/or related overall volume  status. 4. Mild diffuse anasarca. Electronically Signed   By: Jeannine Boga M.D.   On: 11/18/2018 19:56   Ct Biopsy  Result Date: 11/15/2018 INDICATION: Pancytopenia, concern for lymphoproliferative process EXAM: CT GUIDED RIGHT ILIAC BONE MARROW ASPIRATION AND CORE BIOPSY Date:  11/15/2018 11/15/2018 10:13 am Radiologist:  M. Daryll Brod, MD Guidance:  CT FLUOROSCOPY TIME:  Fluoroscopy Time: None. MEDICATIONS: 1% lidocaine local ANESTHESIA/SEDATION: 2.0 mg IV Versed; 50 mcg IV Fentanyl Moderate Sedation Time:  10 minutes The patient was continuously monitored during the procedure by the interventional radiology nurse under my direct supervision. CONTRAST:  None. COMPLICATIONS: None PROCEDURE: Informed consent was obtained from the patient following explanation of the procedure, risks, benefits and alternatives. The patient understands, agrees and consents for the procedure. All questions were addressed. A time  out was performed. The patient was positioned prone and non-contrast localization CT was performed of the pelvis to demonstrate the iliac marrow spaces. Maximal barrier sterile technique utilized including caps, mask, sterile gowns, sterile gloves, large sterile drape, hand hygiene, and Betadine prep. Under sterile conditions and local anesthesia, an 11 gauge coaxial bone biopsy needle was advanced into the right iliac marrow space. Needle position was confirmed with CT imaging. Initially, bone marrow aspiration was performed. Next, the 11 gauge outer cannula was utilized to obtain a right iliac bone marrow core biopsy. Needle was removed. Hemostasis was obtained with compression. The patient tolerated the procedure well. Samples were prepared with the cytotechnologist. No immediate complications. IMPRESSION: CT guided right iliac bone marrow aspiration and core biopsy. Electronically Signed   By: Jerilynn Mages.  Shick M.D.   On: 11/15/2018 11:25   Dg Chest Port 1 View  Result Date: 11/25/2018 CLINICAL DATA:  Dyspnea. EXAM: PORTABLE CHEST 1 VIEW COMPARISON:  November 21, 2018 FINDINGS: The right Port-A-Cath is stable. Small effusion and left basilar opacity remain. Mild pulmonary venous congestion without overt edema. The cardiomediastinal silhouette is stable with cardiomegaly. IMPRESSION: Mild pulmonary venous congestion. Tiny left effusion with underlying opacity in left base, stable. No other change. Electronically Signed   By: Dorise Bullion III M.D   On: 11/25/2018 13:02   Dg Chest Port 1 View  Result Date: 11/11/2018 CLINICAL DATA:  Fever EXAM: PORTABLE CHEST 1 VIEW COMPARISON:  None. FINDINGS: Mild cardiomegaly. No focal consolidation or effusion. No pneumothorax. IMPRESSION: No active disease. Electronically Signed   By: Donavan Foil M.D.   On: 11/11/2018 21:59   Ct Bone Marrow Biopsy & Aspiration  Result Date: 11/15/2018 INDICATION: Pancytopenia, concern for lymphoproliferative process EXAM: CT GUIDED  RIGHT ILIAC BONE MARROW ASPIRATION AND CORE BIOPSY Date:  11/15/2018 11/15/2018 10:13 am Radiologist:  M. Daryll Brod, MD Guidance:  CT FLUOROSCOPY TIME:  Fluoroscopy Time: None. MEDICATIONS: 1% lidocaine local ANESTHESIA/SEDATION: 2.0 mg IV Versed; 50 mcg IV Fentanyl Moderate Sedation Time:  10 minutes The patient was continuously monitored during the procedure by the interventional radiology nurse under my direct supervision. CONTRAST:  None. COMPLICATIONS: None PROCEDURE: Informed consent was obtained from the patient following explanation of the procedure, risks, benefits and alternatives. The patient understands, agrees and consents for the procedure. All questions were addressed. A time out was performed. The patient was positioned prone and non-contrast localization CT was performed of the pelvis to demonstrate the iliac marrow spaces. Maximal barrier sterile technique utilized including caps, mask, sterile gowns, sterile gloves, large sterile drape, hand hygiene, and Betadine prep. Under sterile conditions and local anesthesia, an 11 gauge coaxial bone biopsy needle was advanced into  the right iliac marrow space. Needle position was confirmed with CT imaging. Initially, bone marrow aspiration was performed. Next, the 11 gauge outer cannula was utilized to obtain a right iliac bone marrow core biopsy. Needle was removed. Hemostasis was obtained with compression. The patient tolerated the procedure well. Samples were prepared with the cytotechnologist. No immediate complications. IMPRESSION: CT guided right iliac bone marrow aspiration and core biopsy. Electronically Signed   By: Jerilynn Mages.  Shick M.D.   On: 11/15/2018 11:25   Ir Imaging Guided Port Insertion  Result Date: 11/18/2018 INDICATION: 53 year old female with history lymphoma EXAM: IMPLANTED PORT A CATH PLACEMENT WITH ULTRASOUND AND FLUOROSCOPIC GUIDANCE MEDICATIONS: 2.0 g Ancef; The antibiotic was administered within an appropriate time interval prior to  skin puncture. ANESTHESIA/SEDATION: Moderate (conscious) sedation was employed during this procedure. A total of Versed 2.0 mg and Fentanyl 100 mcg was administered intravenously. Moderate Sedation Time: 18 minutes. The patient's level of consciousness and vital signs were monitored continuously by radiology nursing throughout the procedure under my direct supervision. FLUOROSCOPY TIME:  0 minutes, 12 seconds (1.0 mGy) COMPLICATIONS: None PROCEDURE: The procedure, risks, benefits, and alternatives were explained to the patient. Questions regarding the procedure were encouraged and answered. The patient understands and consents to the procedure. Ultrasound survey was performed with images stored and sent to PACs. The right neck and chest was prepped with chlorhexidine, and draped in the usual sterile fashion using maximum barrier technique (cap and mask, sterile gown, sterile gloves, large sterile sheet, hand hygiene and cutaneous antiseptic). Antibiotic prophylaxis was provided with 2.0g Ancef administered IV one hour prior to skin incision. Local anesthesia was attained by infiltration with 1% lidocaine without epinephrine. Ultrasound demonstrated patency of the right internal jugular vein, and this was documented with an image. Under real-time ultrasound guidance, this vein was accessed with a 21 gauge micropuncture needle and image documentation was performed. A small dermatotomy was made at the access site with an 11 scalpel. A 0.018" wire was advanced into the SVC and used to estimate the length of the internal catheter. The access needle exchanged for a 67F micropuncture vascular sheath. The 0.018" wire was then removed and a 0.035" wire advanced into the IVC. An appropriate location for the subcutaneous reservoir was selected below the clavicle and an incision was made through the skin and underlying soft tissues. The subcutaneous tissues were then dissected using a combination of blunt and sharp surgical  technique and a pocket was formed. A single lumen power injectable portacatheter was then tunneled through the subcutaneous tissues from the pocket to the dermatotomy and the port reservoir placed within the subcutaneous pocket. The venous access site was then serially dilated and a peel away vascular sheath placed over the wire. The wire was removed and the port catheter advanced into position under fluoroscopic guidance. The catheter tip is positioned in the cavoatrial junction. This was documented with a spot image. The portacatheter was then tested and found to flush and aspirate well. The port was flushed with saline followed by 100 units/mL heparinized saline. The pocket was then closed in two layers using first subdermal inverted interrupted absorbable sutures followed by a running subcuticular suture. The epidermis was then sealed with Dermabond. The dermatotomy at the venous access site was also seal with Dermabond. Patient tolerated the procedure well and remained hemodynamically stable throughout. No complications encountered and no significant blood loss encountered IMPRESSION: Status post right IJ port catheter placement. Catheter ready for use. Signed, Dulcy Fanny. Earleen Newport, DO, RPVI  [facility] Electronically Signed   By: Corrie Mckusick D.O.   On: 11/18/2018 11:23   US Abdomen Limited Ruq  Result Date: 11/29/2018 CLINICAL DATA:  Elevated total bilirubin = 5.2 EXAM: ULTRASOUND ABDOMEN LIMITED RIGHT UPPER QUADRANT COMPARISON:  CT abdomen and pelvis 11/27/2018 FINDINGS: Gallbladder: Normally distended without stones or wall thickening. No pericholecystic fluid or sonographic Murphy sign. Common bile duct: Diameter: 3 mm diameter, normal Liver: Normal hepatic echogenicity. No mass or nodularity. No intrahepatic biliary dilatation. Portal vein is patent on color Doppler imaging with normal direction of blood flow towards the liver. No RIGHT upper  quadrant free fluid. IMPRESSION: Normal exam. Electronically Signed   By: Lavonia Dana M.D.   On: 11/29/2018 14:34     ASSESSMENT/PLAN:  Stage IV Hodgkin lymphoma -Port placed in 10/2018; TTE showed normal LVEF -The patient received day 1 of cycle 1 ofAVD + Adcedris on 11/19/2018. On Granix since 11/20/2018. She remains neutropenic, but improving. Continue Granix until ANC > 1.0. -Recommend PRN anti-emetics, including Zofran and Compazine -CT scan of the abdomen pelvis showed the intra-abdominal lymph nodes appear to be improving.  Spleen is decreased in size. -Next dose of chemo due 12/03/2018 but we will delay the treatment until clinical improvement. Date TBD.  Normocytic anemia -Likely secondary to anemia of chronic disease and underlying lymphoma -Hemoglobin is 7.3.   She experiences shortness of breath with minimal exertion.  Give 1 unit of packed red blood cells today (orders have been entered). -Supportive transfusion to keep Hgb > 7. Blood products to be irradiated.   Thrombocytopenia -Likely secondary to underlying lymphoma and recent chemotherapy. -Platelets 91,000. No transfusion indicated today.  -Daily CBC -Goal plts >10k in the absence of bleeding. Blood products to be irradiated.    Neutropenic fever - Resolved -Secondary to underlying lymphoma, bacteremia and C. diff colitis  -Patient had + blood cultures (E Coli and Group B Strep). Repeat cultures negative to date.  -On Ceftriaxone. -Fungal culture negative. -Appreciate assistance from infectious disease.  Severe protein malnutrition -Patient has lost > 20 lbs since the onset of her symptoms, and her most recent albumin was 2.0 with anasarca, consistent with severe protein malnutrition -Dietitian is following. -If her nutrition remains very poor and she is unable to increase her PO intake, we will have to consider enteral nutrition, such as PEG tube, to help improve her nutritional intake  Diarrhea with  abdominal discomfort - Resolved -Stool for C. diff positive.  -Continue oral vancomycin. -CT the abdomen pelvis did not show any concerning findings in the left lower quadrant of her abdomen. -Abdominal pain has resolved.  Stools are now formed. -Continue pain meds as needed.  Electrolyte abnormalities -Electrolyte abnormalities continue to improve. Replete per primary team.  Hyperbilirubinemia -Unclear etiology.  CT scan and abdominal U/S did not show any focal liver abnormality or obstruction. Possibly reactive.  -T Bili down to 5.9 this morning.  -GI following, appreciate their assistance   Urinary retention -Foley catheter has been discontinued. -Nursing is checking a bladder scan periodically.  Deconditioning -Working with PT. Up to chair this am. -Will likely need SNF upon D/C.  Patient's daughter, Anderson [place], [phone], requests periodic updates. Will call later today.    LOS: 21 days   Mikey Bussing, DNP, AGPCNP-BC, AOCNP 12/02/18   Addendum:   I saw and evaluated the patient and reviewed NP Kristin Curcio's note. I agree with the findings and plan.  Patient reports that her diarrhea is  overall improving and her appetite is improving.  She denies any recurrent abdominal pain.  She otherwise feels little bit better since the weekend.  She is on IV ceftriaxone and PO vancomycin for bacteremia and C. diff colitis, respectively.  Per ID recommendation, the plan is for 10 days of IV ceftriaxone and 7 days of p.o. vancomycin.  Patient is currently on day 7 of FDG ceftriaxone, and therefore will to complete her IV antibiotic regimen on 12/05/2018.  Given the patient's immunocompromised state due to recent chemotherapy, I would recommend completing the recommended course of IV antibiotics to reduce the risk of recurrent infection.  Meanwhile, we will continue Granix until [place] > 1000.   Patient is due for C1D15 of AVD + Adcetris on 12/03/2018; however, due to her recent  neutropenic fever secondary to bacteremia and C. difficile colitis, her chemotherapy will need to be delayed until further clinical improvement.  Furthermore, patient developed hyperbilirubinemia over the past a few days, for which gastroenterology has been consulted and thought that the hyperbilirubinemia is due to underlying infection and recent chemotherapy, and therefore no intervention has been recommended.  As most chemotherapies are metabolized by the liver, her bilirubin level would need to improve significantly (ideally normalize) before further chemotherapy is considered.  From a disposition standpoint, patient does not have any insurance, and would need charity care versus some form of insurance, such as Medicaid, to help continue her outpatient treatment.  Therefore, I strongly recommend social work to look into any potential resources that the patient may qualify for prior to discharge, in order to avoid delay in her outpatient treatment.  Tish Men, MD 12/02/2018 4:49 PM

## 2018-12-02 NOTE — Progress Notes (Signed)
INFECTIOUS DISEASE PROGRESS NOTE  ID: Cindy Ward is a 53 y.o. female with  Principal Problem:   Hodgkin lymphoma ([place]) Active Problems:   Lymphadenopathy   Fever   Splenomegaly   Liver enzyme elevation   Thrombocytopenia (HCC)   Unintentional weight loss   Hashimoto's thyroiditis   Goiter   Hypertension   Aortic atherosclerosis (HCC)   Pancytopenia (HCC)   Malnutrition of moderate degree   Normocytic anemia   Neutropenic fever (HCC)   Oral thrush   Enteritis due to Clostridium difficile   Streptococcal bacteremia   Palliative care by specialist   Goals of care, counseling/discussion  Subjective: No complaints. 1 BM today.   Abtx:  Anti-infectives (From admission, onward)   Start     Dose/Rate Route Frequency Ordered Stop   11/27/18 1130  vancomycin (VANCOCIN) 50 mg/mL oral solution 125 mg     125 mg Oral 4 times daily 11/27/18 1051 12/07/18 0959   11/27/18 0915  metroNIDAZOLE (FLAGYL) IVPB 500 mg  Status:  Discontinued     500 mg 100 mL/hr over 60 Minutes Intravenous Every 8 hours 11/27/18 0912 11/27/18 1051   11/26/18 0200  cefTRIAXone (ROCEPHIN) 2 g in sodium chloride 0.9 % 100 mL IVPB     2 g 200 mL/hr over 30 Minutes Intravenous Daily at bedtime 11/26/18 0059     11/25/18 1200  fluconazole (DIFLUCAN) IVPB 200 mg     200 mg 100 mL/hr over 60 Minutes Intravenous Every 24 hours 11/25/18 0836     11/25/18 1000  vancomycin (VANCOCIN) IVPB 1000 mg/200 mL premix  Status:  Discontinued     1,000 mg 200 mL/hr over 60 Minutes Intravenous Every 12 hours 11/25/18 0848 11/26/18 0827   11/25/18 1000  ceFEPIme (MAXIPIME) 2 g in sodium chloride 0.9 % 100 mL IVPB  Status:  Discontinued     2 g 200 mL/hr over 30 Minutes Intravenous Every 8 hours 11/25/18 0848 11/26/18 0058   11/25/18 0900  vancomycin (VANCOCIN) 1,500 mg in sodium chloride 0.9 % 500 mL IVPB     1,500 mg 250 mL/hr over 120 Minutes Intravenous  Once 11/25/18 0848 11/25/18 1135   11/24/18 1500  fluconazole  (DIFLUCAN) tablet 200 mg  Status:  Discontinued     200 mg Oral Daily 11/24/18 1450 11/25/18 0836   11/18/18 0600  ceFAZolin (ANCEF) IVPB 2g/100 mL premix     2 g 200 mL/hr over 30 Minutes Intravenous To Radiology 11/17/18 1243 11/18/18 1130   11/12/18 0800  cefTRIAXone (ROCEPHIN) 2 g in sodium chloride 0.9 % 100 mL IVPB  Status:  Discontinued     2 g 200 mL/hr over 30 Minutes Intravenous Every 24 hours 11/11/18 1711 11/12/18 1057      Medications:  Scheduled: . sodium chloride   Intravenous Once  . Chlorhexidine Gluconate Cloth  6 each Topical Daily  . levothyroxine  37.5 mcg Intravenous Daily  . mouth rinse  15 mL Mouth Rinse BID  . multivitamin with minerals  1 tablet Oral Daily  . nebivolol  10 mg Oral Daily  . nystatin  5 mL Oral QID  . phytonadione  10 mg Subcutaneous Daily  . scopolamine  1 patch Transdermal Q72H  . Tbo-filgastrim (GRANIX) SQ  480 mcg Subcutaneous q1800  . vancomycin  125 mg Oral QID    Objective: Vital signs in last 24 hours: Temp:  [98.1 F (36.7 C)-98.7 F (37.1 C)] 98.3 F (36.8 C) (03/09 1145) Pulse Rate:  [92-99] 92 (  03/09 1145) Resp:  [16-22] 22 (03/09 1145) BP: (95-119)/(60-70) 111/60 (03/09 1145) SpO2:  [92 %-97 %] 92 % (03/09 1145) Weight:  [64.9 kg] 64.9 kg (03/09 0513)   General appearance: alert, cooperative, fatigued and no distress Resp: clear to auscultation bilaterally Chest wall: no tenderness, port clean.  Cardio: regular rate and rhythm GI: normal findings: bowel sounds normal and soft, non-tender Extremities: edema none  Lab Results Recent Labs    12/01/18 0404 12/02/18 0324  WBC 0.6* 0.9*  HGB 8.1* 7.3*  HCT 26.0* 24.1*  NA 143 147*  K 3.5 3.4*  CL 113* 116*  CO2 23 25  BUN 8 10  CREATININE 0.47 0.50   Liver Panel Recent Labs    12/01/18 0404 12/02/18 0324  PROT 4.7* 4.6*  ALBUMIN 1.6* 1.6*  AST 50* 48*  ALT 64* 65*  ALKPHOS 97 99  BILITOT 6.7* 5.9*   Sedimentation Rate No results for input(s):  ESRSEDRATE in the last 72 hours. C-Reactive Protein No results for input(s): CRP in the last 72 hours.  Microbiology: Recent Results (from the past 240 hour(s))  Culture, blood (routine x 2)     Status: Abnormal   Collection Time: 11/25/18  9:08 AM  Result Value Ref Range Status   Specimen Description   Final    BLOOD RIGHT HAND Performed at [facility] [address] [place], [place] [address]    Special Requests   Final    BAA BCAV Performed at [facility] [address], [place], [place] [address]    Culture  Setup Time   Final    GRAM POSITIVE COCCI GRAM NEGATIVE RODS IN BOTH AEROBIC AND ANAEROBIC BOTTLES CRITICAL VALUE NOTED.  VALUE IS CONSISTENT WITH PREVIOUSLY REPORTED AND CALLED VALUE.    Culture (A)  Final    ESCHERICHIA COLI GROUP B STREP(S.AGALACTIAE)ISOLATED SUSCEPTIBILITIES PERFORMED ON PREVIOUS CULTURE WITHIN THE LAST 5 DAYS. Performed at [facility], [place] [address], [place], [place] [address]    Report Status 11/28/2018 FINAL  Final  Culture, blood (routine x 2)     Status: Abnormal   Collection Time: 11/25/18  9:08 AM  Result Value Ref Range Status   Specimen Description   Final    BLOOD LEFT ARM Performed at [facility] [address] [place], [place] [address]    Special Requests   Final    BAA BCAV Performed at [facility] [address], [place], [place] [address]    Culture  Setup Time   Final    GRAM POSITIVE COCCI GRAM NEGATIVE RODS IN BOTH AEROBIC AND ANAEROBIC BOTTLES CRITICAL RESULT CALLED TO, READ BACK BY AND VERIFIED WITH: E GREEN PHARMD 11/26/18 0025 JDW Performed at [facility], [place] [address] [place],  [address]    Culture (A)  Final    ESCHERICHIA COLI GROUP B STREP(S.AGALACTIAE)ISOLATED    Report Status 11/28/2018 FINAL  Final   Organism ID, Bacteria ESCHERICHIA COLI  Final   Organism ID, Bacteria GROUP B  STREP(S.AGALACTIAE)ISOLATED  Final      Susceptibility   Escherichia coli - MIC*    AMPICILLIN >=32 RESISTANT Resistant     CEFAZOLIN <=4 SENSITIVE Sensitive     CEFEPIME <=1 SENSITIVE Sensitive     CEFTAZIDIME <=1 SENSITIVE Sensitive     CEFTRIAXONE <=1 SENSITIVE Sensitive     CIPROFLOXACIN >=4 RESISTANT Resistant     GENTAMICIN <=1 SENSITIVE Sensitive  IMIPENEM <=0.25 SENSITIVE Sensitive     TRIMETH/SULFA <=20 SENSITIVE Sensitive     AMPICILLIN/SULBACTAM >=32 RESISTANT Resistant     PIP/TAZO <=4 SENSITIVE Sensitive     Extended ESBL NEGATIVE Sensitive     * ESCHERICHIA COLI   Group b strep(s.agalactiae)isolated - MIC*    CLINDAMYCIN >=1 RESISTANT Resistant     AMPICILLIN <=0.25 SENSITIVE Sensitive     ERYTHROMYCIN >=8 RESISTANT Resistant     VANCOMYCIN 0.5 SENSITIVE Sensitive     CEFTRIAXONE <=0.12 SENSITIVE Sensitive     LEVOFLOXACIN 1 SENSITIVE Sensitive     PENICILLIN SENSITIVE Sensitive     * GROUP B STREP(S.AGALACTIAE)ISOLATED  Blood Culture ID Panel (Reflexed)     Status: Abnormal   Collection Time: 11/25/18  9:08 AM  Result Value Ref Range Status   Enterococcus species NOT DETECTED NOT DETECTED Final   Listeria monocytogenes NOT DETECTED NOT DETECTED Final   Staphylococcus species NOT DETECTED NOT DETECTED Final   Staphylococcus aureus (BCID) NOT DETECTED NOT DETECTED Final   Streptococcus species DETECTED (A) NOT DETECTED Final    Comment: CRITICAL RESULT CALLED TO, READ BACK BY AND VERIFIED WITH: E GREEN PHARMD 11/26/18 0025 JDW    Streptococcus agalactiae DETECTED (A) NOT DETECTED Final    Comment: CRITICAL RESULT CALLED TO, READ BACK BY AND VERIFIED WITH: E GREEN PHARMD 11/26/18 0025 JDW    Streptococcus pneumoniae NOT DETECTED NOT DETECTED Final   Streptococcus pyogenes NOT DETECTED NOT DETECTED Final   Acinetobacter baumannii NOT DETECTED NOT DETECTED Final   Enterobacteriaceae species DETECTED (A) NOT DETECTED Final    Comment: Enterobacteriaceae represent a  large family of gram-negative bacteria, not a single organism. CRITICAL RESULT CALLED TO, READ BACK BY AND VERIFIED WITH: E GREEN PHARMD 11/26/18 0025 JDW    Enterobacter cloacae complex NOT DETECTED NOT DETECTED Final   Escherichia coli DETECTED (A) NOT DETECTED Final    Comment: CRITICAL RESULT CALLED TO, READ BACK BY AND VERIFIED WITH: E GREEN PHARMD 11/26/18 0025 JDW    Klebsiella oxytoca NOT DETECTED NOT DETECTED Final   Klebsiella pneumoniae NOT DETECTED NOT DETECTED Final   Proteus species NOT DETECTED NOT DETECTED Final   Serratia marcescens NOT DETECTED NOT DETECTED Final   Carbapenem resistance NOT DETECTED NOT DETECTED Final   Haemophilus influenzae NOT DETECTED NOT DETECTED Final   Neisseria meningitidis NOT DETECTED NOT DETECTED Final   Pseudomonas aeruginosa NOT DETECTED NOT DETECTED Final   Candida albicans NOT DETECTED NOT DETECTED Final   Candida glabrata NOT DETECTED NOT DETECTED Final   Candida krusei NOT DETECTED NOT DETECTED Final   Candida parapsilosis NOT DETECTED NOT DETECTED Final   Candida tropicalis NOT DETECTED NOT DETECTED Final    Comment: Performed at [facility], [place] [address] [place], [place] [address]  Fungus culture, blood     Status: None   Collection Time: 11/25/18  1:26 PM  Result Value Ref Range Status   Specimen Description   Final    BLOOD Performed at [facility], [place] [address] [place], [place] [address]    Special Requests   Final    NONE Performed at [facility], [place] [address] [place], [place] [address]    Culture   Final    NO GROWTH 7 DAYS NO FUNGUS ISOLATED Performed at [facility], [place] [address] [place], [place] [address]    Report Status 12/02/2018 FINAL  Final  C difficile quick scan w PCR reflex  Status: Abnormal   Collection Time: 11/27/18  8:00 AM  Result Value Ref Range Status   C Diff antigen POSITIVE (A) NEGATIVE Final   C Diff toxin NEGATIVE NEGATIVE Final   C Diff  interpretation Results are indeterminate. See PCR results.  Final    Comment: Performed at [facility], [place] [address] [place], [place] [address]  C. Diff by PCR, Reflexed     Status: Abnormal   Collection Time: 11/27/18  8:00 AM  Result Value Ref Range Status   Toxigenic C. Difficile by PCR POSITIVE (A) NEGATIVE Final    Comment: Positive for toxigenic C. difficile with little to no toxin production. Only treat if clinical presentation suggests symptomatic illness. Performed at [facility], [place] [address] [place], [place] [address]   Culture, blood (Routine X 2) w Reflex to ID Panel     Status: None (Preliminary result)   Collection Time: 11/28/18 12:19 AM  Result Value Ref Range Status   Specimen Description   Final    BLOOD RIGHT HAND Performed at [facility] [address], [place], [place] [address]    Special Requests   Final    BOTTLES DRAWN AEROBIC AND ANAEROBIC Blood Culture adequate volume Performed at [facility] [address] [place], [place] [address]    Culture   Final    NO GROWTH 4 DAYS Performed at [facility], [place] [address], [place], [place] [address]    Report Status PENDING  Incomplete  Culture, blood (Routine X 2) w Reflex to ID Panel     Status: None (Preliminary result)   Collection Time: 11/28/18 12:19 AM  Result Value Ref Range Status   Specimen Description   Final    BLOOD LEFT HAND Performed at [facility] [address], [place], [place] [address]    Special Requests   Final    BOTTLES DRAWN AEROBIC AND ANAEROBIC Blood Culture adequate volume Performed at [facility] [address], [place], [place] [address]    Culture   Final    NO GROWTH 4 DAYS Performed at [facility], [place] [address] [place], [place] [address]    Report Status PENDING  Incomplete    Studies/Results: No results  found.   Assessment/Plan: Hodgkin Lymphoma Pancytopenia (ANC 400) Port placed on 2-24 CTX-AVD + Adcedris, granix (2-25). E coli and S agalactiae (4/4) bacteremia C diff thrush Protein Calorie Malnutrition (alb 1.6) Hypocalcemia, Hypokalemia  Total days of antibiotics:7 ceftriaxone/flucon. Day 5 po vanco  Aim for 14 days of po vanco Aim for 10 days of ceftriaxone.  Will stop fluconazole.  Hopefully ANC will improve with support.  Available as needed.          Bobby Rumpf MD, FACP Infectious Diseases (pager) [phone] [email or web address] 12/02/2018, 12:43 PM  LOS: 21 days

## 2018-12-02 NOTE — Progress Notes (Signed)
Bladder scan showing 680, patient up to [facility] able to void 248mL. I/O cath for 582mL of amber urine, see flowsheet.

## 2018-12-02 NOTE — Progress Notes (Signed)
PROGRESS NOTE    Cindy Ward  LKJ:[id number] DOB: 14-Jun-1966 DOA: 11/11/2018 PCP: [name], MD   Brief Narrative:  53 year old female with history of essential hypertension, hypothyroidism initially presented to [facility] with complaints of recurrent nausea, vomiting, fever and night sweats.  Upon extensive work-up she was diagnosed with Hodgkin's lymphoma, stage IV.  Oncology team was consulted and patient was transferred here for further care and management.  Lymph node biopsy was consistent with classic Hodgkin's lymphoma.  Hospital course has been complicated by bacteremia, intermittent encephalopathy, severe protein calorie malnutrition and electrolyte imbalance.  She is also been pancytopenic since she is on chemotherapy receiving Granix.  Pulmonary and oncology team has been following.   Assessment & Plan:   Principal Problem:   Hodgkin lymphoma ([place]) Active Problems:   Lymphadenopathy   Fever   Splenomegaly   Liver enzyme elevation   Thrombocytopenia (HCC)   Unintentional weight loss   Hashimoto's thyroiditis   Goiter   Hypertension   Aortic atherosclerosis (HCC)   Pancytopenia (HCC)   Malnutrition of moderate degree   Normocytic anemia   Neutropenic fever (HCC)   Oral thrush   Enteritis due to Clostridium difficile   Streptococcal bacteremia   Palliative care by specialist   Goals of care, counseling/discussion  Stage IV Hodgkin's lymphoma, new diagnosis - CT of the chest abdomen pelvis consistent with diffuse lymphadenopathy.  CT of the head is negative for any metastatic disease.  Lymph node biopsy performed 2/18-consistent with Hodgkin's lymphoma.  Port-A-Cath placed 2/24.  Chemotherapy started 2/25. Onc following, likely plans for outpatient chemo for 2nd round.  -Appreciate Palliative care input.   Pancytopenia with mild rectal bleeding overnight.  Anemia of chronic disease, Hb 7.3 - Platelets have improved to 91.  Would favor transfusing 1 unit  of PRBC to help her symptomatically therefore she can participate with physical therapy and recovery. Granix until [facility] is greater than 1  Abnormal breath sounds - Bronchodilators.  Incentive spirometry and flutter valve  Sinus tachycardia; greatly improved - with hydration and beta-blockers.  Bacteremia with E. coli and group B strep with neutropenia - ID recommends continue Rocephin/fluconazole-day 7.  Oral vancomycin day 5 -Continue to follow blood cultures.  Repeat Cx 3/5= NGTD -Fungal Cx- NGTD 7 days and strep agalactiae data - C. difficile antigen positive, toxin negative.    Elevated Total Bilirubin; possible due to hepatotoxic meds -CT abdomen pelvis is negative for any acute pathology in the right upper quadrant area -Right upper quadrant ultrasound- negative. -Appreciate GI input.  T bili trending down.  Acute metabolic encephalopathy/delirium; improved.  Visual hallucination; resolved.  -Resolved  Hypokalemia/hypophosphatemia/hypocalcemia -Aggressive repletion  Severe protein calorie malnutrition -Encourage oral diet as tolerated.  Acute urinary retention with bilateral hydroureteronephrosis -Foley catheter removed.  Still continues to have urinary retention therefore getting intermittent cath.  Avoiding to place long-term catheter due to her neutropenic state  Severe protein calorie malnutrition Mild dysphagia with oral thrush - Encourage p.o. intake.  Nutrition consultation. - Nystatin and Diflucan given.  Hypothyroidism -Continue Synthroid  Generalized debility - PT following- will eventually need skilled nursing facility  DVT prophylaxis: SCDs Code Status: Full code Family Communication: None at bedside Disposition Plan: Once her diarrhea has improved and p.o. intake is improved, will start transitioning to skilled nursing facility over next 48-72 hours  Consultants:   Oncology   PCCM  Procedures:   Bx 2/21  Port cath 2/24  Antimicrobials:    Anti-infectives (From admission, onward)  Start     Dose/Rate Route Frequency Ordered Stop   11/27/18 1130  vancomycin (VANCOCIN) 50 mg/mL oral solution 125 mg     125 mg Oral 4 times daily 11/27/18 1051 12/07/18 0959   11/27/18 0915  metroNIDAZOLE (FLAGYL) IVPB 500 mg  Status:  Discontinued     500 mg 100 mL/hr over 60 Minutes Intravenous Every 8 hours 11/27/18 0912 11/27/18 1051   11/26/18 0200  cefTRIAXone (ROCEPHIN) 2 g in sodium chloride 0.9 % 100 mL IVPB     2 g 200 mL/hr over 30 Minutes Intravenous Daily at bedtime 11/26/18 0059     11/25/18 1200  fluconazole (DIFLUCAN) IVPB 200 mg     200 mg 100 mL/hr over 60 Minutes Intravenous Every 24 hours 11/25/18 0836     11/25/18 1000  vancomycin (VANCOCIN) IVPB 1000 mg/200 mL premix  Status:  Discontinued     1,000 mg 200 mL/hr over 60 Minutes Intravenous Every 12 hours 11/25/18 0848 11/26/18 0827   11/25/18 1000  ceFEPIme (MAXIPIME) 2 g in sodium chloride 0.9 % 100 mL IVPB  Status:  Discontinued     2 g 200 mL/hr over 30 Minutes Intravenous Every 8 hours 11/25/18 0848 11/26/18 0058   11/25/18 0900  vancomycin (VANCOCIN) 1,500 mg in sodium chloride 0.9 % 500 mL IVPB     1,500 mg 250 mL/hr over 120 Minutes Intravenous  Once 11/25/18 0848 11/25/18 1135   11/24/18 1500  fluconazole (DIFLUCAN) tablet 200 mg  Status:  Discontinued     200 mg Oral Daily 11/24/18 1450 11/25/18 0836   11/18/18 0600  ceFAZolin (ANCEF) IVPB 2g/100 mL premix     2 g 200 mL/hr over 30 Minutes Intravenous To Radiology 11/17/18 1243 11/18/18 1130   11/12/18 0800  cefTRIAXone (ROCEPHIN) 2 g in sodium chloride 0.9 % 100 mL IVPB  Status:  Discontinued     2 g 200 mL/hr over 30 Minutes Intravenous Every 24 hours 11/11/18 1711 11/12/18 1057         Subjective: Very weak, continues to have diarrhea overnight.  No obvious signs of bleeding.  Review of Systems Otherwise negative except as per HPI, including: General = no fevers, chills, dizziness, malaise,  fatigue HEENT/EYES = negative for pain, redness, loss of vision, double vision, blurred vision, loss of hearing, sore throat, hoarseness, dysphagia Cardiovascular= negative for chest pain, palpitation, murmurs, lower extremity swelling Respiratory/lungs= negative for shortness of breath, cough, hemoptysis, wheezing, mucus production Gastrointestinal= negative for nausea, vomiting, abdominal pain, melena, hematemesis Genitourinary= negative for Dysuria, Hematuria, Change in Urinary Frequency MSK = Negative for arthralgia, myalgias, Back Pain, Joint swelling  Neurology= Negative for headache, seizures, numbness, tingling  Psychiatry= Negative for anxiety, depression, suicidal and homocidal ideation Allergy/Immunology= Medication/Food allergy as listed  Skin= Negative for Rash, lesions, ulcers, itching   Objective: Vitals:   12/01/18 0522 12/01/18 1355 12/01/18 2232 12/02/18 0513  BP: 119/60 95/68 119/60 116/70  Pulse: 98 99 95 95  Resp: 18 (!) 22 20 20   Temp: 98.4 F (36.9 C) 98.7 F (37.1 C) 98.3 F (36.8 C) 98.1 F (36.7 C)  TempSrc: Oral Oral Oral Oral  SpO2: 98% 97% 95% 97%  Weight:    64.9 kg  Height:        Intake/Output Summary (Last 24 hours) at 12/02/2018 1045 Last data filed at 12/02/2018 0930 Gross per 24 hour  Intake 394.83 ml  Output 1375 ml  Net -980.17 ml   Filed Weights   11/30/18 0403 12/01/18  2836 12/02/18 0513  Weight: 67.6 kg 65.8 kg 64.9 kg    Examination: Constitutional: NAD, calm, comfortable, temporal wasting Eyes: PERRL, lids and conjunctivae normal ENMT: Mucous membranes are moist. Posterior pharynx clear of any exudate or lesions.Normal dentition.  Neck: normal, supple, no masses, no thyromegaly Respiratory: Slightly coarse breath sounds Cardiovascular: Regular rate and rhythm, no murmurs / rubs / gallops. No extremity edema. 2+ pedal pulses. No carotid bruits.  Abdomen: no tenderness, no masses palpated. No hepatosplenomegaly. Bowel sounds  positive.  Musculoskeletal: no clubbing / cyanosis. No joint deformity upper and lower extremities. Good ROM, no contractures. Normal muscle tone.  Skin: no rashes, lesions, ulcers. No induration Neurologic: CN 2-12 grossly intact. Sensation intact, DTR normal. Strength 4/5 in all 4.  Psychiatric: Normal judgment and insight. Alert and oriented x 3. Normal mood.   Port-A-Cath in place  Data Reviewed:   CBC: Recent Labs  Lab 11/28/18 0440 11/29/18 0500 11/30/18 0345 11/30/18 2356 12/01/18 0404 12/02/18 0324  WBC 0.3* 0.4* 0.4*  --  0.6* 0.9*  NEUTROABS  --   --   --   --  0.2* 0.4*  HGB 7.9* 8.0* 7.2* 7.8* 8.1* 7.3*  HCT 24.3* 25.8* 23.3* 25.4* 26.0* 24.1*  MCV 87.4 89.6 90.3  --  90.3 90.9  PLT 11* 38* 38*  --  61* 91*   Basic Metabolic Panel: Recent Labs  Lab 11/28/18 0440 11/28/18 1736 11/29/18 0500 11/30/18 0345 12/01/18 0404 12/02/18 0324  NA 144 145 143 141 143 147*  K 3.3* 3.4* 3.5 3.3* 3.5 3.4*  CL 110 112* 109 112* 113* 116*  CO2 25 24 25 22 23 25   GLUCOSE 141* 147* 159* 120* 98 95  BUN 7 9 9 8 8 10   CREATININE 0.58 0.57 0.61 0.49 0.47 0.50  CALCIUM 4.5* 5.1* 5.6* 6.4* 7.5* 8.0*  MG 2.5*  --  2.2 2.2 1.9 1.8  PHOS 2.0*  --  1.7* 2.9 2.9 3.2   GFR: Estimated Creatinine Clearance: 82 mL/min (by C-G formula based on SCr of 0.5 mg/dL). Liver Function Tests: Recent Labs  Lab 11/28/18 1736 11/29/18 0500 11/30/18 0345 12/01/18 0404 12/02/18 0324  AST 36 33 39 50* 48*  ALT 60* 59* 56* 64* 65*  ALKPHOS 109 106 91 97 99  BILITOT 5.1* 5.2* 5.6* 6.7* 5.9*  PROT 5.1* 5.1* 4.6* 4.7* 4.6*  ALBUMIN 1.9* 2.0* 1.7* 1.6* 1.6*   No results for input(s): LIPASE, AMYLASE in the last 168 hours. Recent Labs  Lab 11/25/18 1228  AMMONIA 24   Coagulation Profile: No results for input(s): INR, PROTIME in the last 168 hours. Cardiac Enzymes: No results for input(s): CKTOTAL, CKMB, CKMBINDEX, TROPONINI in the last 168 hours. BNP (last 3 results) No results for  input(s): PROBNP in the last 8760 hours. HbA1C: No results for input(s): HGBA1C in the last 72 hours. CBG: No results for input(s): GLUCAP in the last 168 hours. Lipid Profile: No results for input(s): CHOL, HDL, LDLCALC, TRIG, CHOLHDL, LDLDIRECT in the last 72 hours. Thyroid Function Tests: No results for input(s): TSH, T4TOTAL, FREET4, T3FREE, THYROIDAB in the last 72 hours. Anemia Panel: No results for input(s): VITAMINB12, FOLATE, FERRITIN, TIBC, IRON, RETICCTPCT in the last 72 hours. Sepsis Labs: No results for input(s): PROCALCITON, LATICACIDVEN in the last 168 hours.  Recent Results (from the past 240 hour(s))  Culture, blood (routine x 2)     Status: Abnormal   Collection Time: 11/25/18  9:08 AM  Result Value Ref Range Status  Specimen Description   Final    BLOOD RIGHT HAND Performed at [facility] [address] [place], [place] [address]    Special Requests   Final    BAA BCAV Performed at [facility] [address] [place], [place] [address]    Culture  Setup Time   Final    GRAM POSITIVE COCCI GRAM NEGATIVE RODS IN BOTH AEROBIC AND ANAEROBIC BOTTLES CRITICAL VALUE NOTED.  VALUE IS CONSISTENT WITH PREVIOUSLY REPORTED AND CALLED VALUE.    Culture (A)  Final    ESCHERICHIA COLI GROUP B STREP(S.AGALACTIAE)ISOLATED SUSCEPTIBILITIES PERFORMED ON PREVIOUS CULTURE WITHIN THE LAST 5 DAYS. Performed at [facility], [place] [address] [place], [place] [address]    Report Status 11/28/2018 FINAL  Final  Culture, blood (routine x 2)     Status: Abnormal   Collection Time: 11/25/18  9:08 AM  Result Value Ref Range Status   Specimen Description   Final    BLOOD LEFT ARM Performed at [facility] [address] [place], [place] [address]    Special Requests   Final    BAA BCAV Performed at [facility] [address], [place], [place] [address]    Culture  Setup Time   Final     GRAM POSITIVE COCCI GRAM NEGATIVE RODS IN BOTH AEROBIC AND ANAEROBIC BOTTLES CRITICAL RESULT CALLED TO, READ BACK BY AND VERIFIED WITH: E GREEN PHARMD 11/26/18 0025 JDW Performed at [facility], [place] [address] [place], [place] [address]    Culture (A)  Final    ESCHERICHIA COLI GROUP B STREP(S.AGALACTIAE)ISOLATED    Report Status 11/28/2018 FINAL  Final   Organism ID, Bacteria ESCHERICHIA COLI  Final   Organism ID, Bacteria GROUP B STREP(S.AGALACTIAE)ISOLATED  Final      Susceptibility   Escherichia coli - MIC*    AMPICILLIN >=32 RESISTANT Resistant     CEFAZOLIN <=4 SENSITIVE Sensitive     CEFEPIME <=1 SENSITIVE Sensitive     CEFTAZIDIME <=1 SENSITIVE Sensitive     CEFTRIAXONE <=1 SENSITIVE Sensitive     CIPROFLOXACIN >=4 RESISTANT Resistant     GENTAMICIN <=1 SENSITIVE Sensitive     IMIPENEM <=0.25 SENSITIVE Sensitive     TRIMETH/SULFA <=20 SENSITIVE Sensitive     AMPICILLIN/SULBACTAM >=32 RESISTANT Resistant     PIP/TAZO <=4 SENSITIVE Sensitive     Extended ESBL NEGATIVE Sensitive     * ESCHERICHIA COLI   Group b strep(s.agalactiae)isolated - MIC*    CLINDAMYCIN >=1 RESISTANT Resistant     AMPICILLIN <=0.25 SENSITIVE Sensitive     ERYTHROMYCIN >=8 RESISTANT Resistant     VANCOMYCIN 0.5 SENSITIVE Sensitive     CEFTRIAXONE <=0.12 SENSITIVE Sensitive     LEVOFLOXACIN 1 SENSITIVE Sensitive     PENICILLIN SENSITIVE Sensitive     * GROUP B STREP(S.AGALACTIAE)ISOLATED  Blood Culture ID Panel (Reflexed)     Status: Abnormal   Collection Time: 11/25/18  9:08 AM  Result Value Ref Range Status   Enterococcus species NOT DETECTED NOT DETECTED Final   Listeria monocytogenes NOT DETECTED NOT DETECTED Final   Staphylococcus species NOT DETECTED NOT DETECTED Final   Staphylococcus aureus (BCID) NOT DETECTED NOT DETECTED Final   Streptococcus species DETECTED (A) NOT DETECTED Final    Comment: CRITICAL RESULT CALLED TO, READ BACK BY AND VERIFIED WITH: E GREEN PHARMD 11/26/18 0025  JDW    Streptococcus agalactiae DETECTED (A) NOT DETECTED Final  Comment: CRITICAL RESULT CALLED TO, READ BACK BY AND VERIFIED WITH: E GREEN PHARMD 11/26/18 0025 JDW    Streptococcus pneumoniae NOT DETECTED NOT DETECTED Final   Streptococcus pyogenes NOT DETECTED NOT DETECTED Final   Acinetobacter baumannii NOT DETECTED NOT DETECTED Final   Enterobacteriaceae species DETECTED (A) NOT DETECTED Final    Comment: Enterobacteriaceae represent a large family of gram-negative bacteria, not a single organism. CRITICAL RESULT CALLED TO, READ BACK BY AND VERIFIED WITH: E GREEN PHARMD 11/26/18 0025 JDW    Enterobacter cloacae complex NOT DETECTED NOT DETECTED Final   Escherichia coli DETECTED (A) NOT DETECTED Final    Comment: CRITICAL RESULT CALLED TO, READ BACK BY AND VERIFIED WITH: E GREEN PHARMD 11/26/18 0025 JDW    Klebsiella oxytoca NOT DETECTED NOT DETECTED Final   Klebsiella pneumoniae NOT DETECTED NOT DETECTED Final   Proteus species NOT DETECTED NOT DETECTED Final   Serratia marcescens NOT DETECTED NOT DETECTED Final   Carbapenem resistance NOT DETECTED NOT DETECTED Final   Haemophilus influenzae NOT DETECTED NOT DETECTED Final   Neisseria meningitidis NOT DETECTED NOT DETECTED Final   Pseudomonas aeruginosa NOT DETECTED NOT DETECTED Final   Candida albicans NOT DETECTED NOT DETECTED Final   Candida glabrata NOT DETECTED NOT DETECTED Final   Candida krusei NOT DETECTED NOT DETECTED Final   Candida parapsilosis NOT DETECTED NOT DETECTED Final   Candida tropicalis NOT DETECTED NOT DETECTED Final    Comment: Performed at [facility], [place] [address], [place], [place] [address]  Fungus culture, blood     Status: None   Collection Time: 11/25/18  1:26 PM  Result Value Ref Range Status   Specimen Description   Final    BLOOD Performed at [facility], [place] [address], [place], [place] [address]    Special Requests   Final    NONE Performed at [facility],  [place] [address] [place], [place] [address]    Culture   Final    NO GROWTH 7 DAYS NO FUNGUS ISOLATED Performed at [facility], [place] [address] [place], [place] [address]    Report Status 12/02/2018 FINAL  Final  C difficile quick scan w PCR reflex     Status: Abnormal   Collection Time: 11/27/18  8:00 AM  Result Value Ref Range Status   C Diff antigen POSITIVE (A) NEGATIVE Final   C Diff toxin NEGATIVE NEGATIVE Final   C Diff interpretation Results are indeterminate. See PCR results.  Final    Comment: Performed at [facility], [place] [address], [place], [place] [address]  C. Diff by PCR, Reflexed     Status: Abnormal   Collection Time: 11/27/18  8:00 AM  Result Value Ref Range Status   Toxigenic C. Difficile by PCR POSITIVE (A) NEGATIVE Final    Comment: Positive for toxigenic C. difficile with little to no toxin production. Only treat if clinical presentation suggests symptomatic illness. Performed at [facility], [place] [address], [place], [place] [address]   Culture, blood (Routine X 2) w Reflex to ID Panel     Status: None (Preliminary result)   Collection Time: 11/28/18 12:19 AM  Result Value Ref Range Status   Specimen Description   Final    BLOOD RIGHT HAND Performed at [facility] [address] [place], [place] [address]    Special Requests   Final    BOTTLES DRAWN AEROBIC AND ANAEROBIC Blood Culture adequate  volume Performed at [facility], [place] [address], [place], [place] [address]    Culture   Final    NO GROWTH 4 DAYS Performed at [facility], [place] [address] [place], [place] [address]    Report Status PENDING  Incomplete  Culture, blood (Routine X 2) w Reflex to ID Panel     Status: None (Preliminary result)   Collection Time: 11/28/18 12:19 AM  Result Value Ref Range Status   Specimen Description   Final    BLOOD LEFT HAND Performed at [facility]  [address] [place], [place] [address]    Special Requests   Final    BOTTLES DRAWN AEROBIC AND ANAEROBIC Blood Culture adequate volume Performed at [facility] [address], [place], [place] [address]    Culture   Final    NO GROWTH 4 DAYS Performed at [facility], [place] [address], [place], [place] [address]    Report Status PENDING  Incomplete         Radiology Studies: No results found.      Scheduled Meds: . sodium chloride   Intravenous Once  . Chlorhexidine Gluconate Cloth  6 each Topical Daily  . levothyroxine  37.5 mcg Intravenous Daily  . mouth rinse  15 mL Mouth Rinse BID  . multivitamin with minerals  1 tablet Oral Daily  . nebivolol  10 mg Oral Daily  . nystatin  5 mL Oral QID  . phytonadione  10 mg Subcutaneous Daily  . scopolamine  1 patch Transdermal Q72H  . Tbo-filgastrim (GRANIX) SQ  480 mcg Subcutaneous q1800  . vancomycin  125 mg Oral QID   Continuous Infusions: . sodium chloride 1,000 mL (11/30/18 1420)  . sodium chloride    . cefTRIAXone (ROCEPHIN)  IV 2 g (12/01/18 2259)  . fluconazole (DIFLUCAN) IV 200 mg (12/01/18 1323)     LOS: 21 days   Time spent= 40 mins    Jenna Routzahn Arsenio Loader, MD [facility]  If 7PM-7AM, please contact night-coverage [email or web address] 12/02/2018, 10:45 AM

## 2018-12-02 NOTE — Progress Notes (Signed)
Physical Therapy Treatment Patient Details Name: Cindy Ward MRN: [id number] DOB: [birth year] Today's Date: 12/02/2018    History of Present Illness 53 yo female admitted to ED on 11/11/18 for 9 month history of N/V, fatigue, and weight loss. Pt previously followed in [place], oncology reports inconclusive findings. 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 Pt with R iliac BM aspiration and core, awaiting results. Other PMH includes HTN, hypothyroidism.  Course complicated by sepsis and trasnferred to ICU on 11/25/18.    PT Comments    Progressing slowly with mobility.    Follow Up Recommendations  SNF     Equipment Recommendations  None recommended by PT    Recommendations for Other Services       Precautions / Restrictions Precautions Precautions: Fall Restrictions Weight Bearing Restrictions: No Other Position/Activity Restrictions: L UE-no lifting >10# for 2-3 weeks 2* biopsy  (s/p biopsy 11/12/18)      Mobility  Bed Mobility Overal bed mobility: Needs Assistance Bed Mobility: Supine to Sit;Sit to Supine     Supine to sit: HOB elevated;Min guard Sit to supine: HOB elevated;Min guard   General bed mobility comments: close guard for safety.   Transfers Overall transfer level: Needs assistance   Transfers: Sit to/from Stand Sit to Stand: Min assist         General transfer comment: Assist to rise, stabilize, control descent.   Ambulation/Gait Ambulation/Gait assistance: Min assist Gait Distance (Feet): 20 Feet Assistive device: IV Pole(vs "furniture walking") Gait Pattern/deviations: Step-through pattern     General Gait Details: slow gait speed. Assist to steady. LEs appeared to get weaker/fatigued as distance progressed-pt adopted more of a bil flexed knee posture.   Stairs             Wheelchair Mobility    Modified Rankin (Stroke Patients Only)       Balance Overall balance  assessment: Needs assistance           Standing balance-Leahy Scale: Fair                              Cognition Arousal/Alertness: Awake/alert Behavior During Therapy: Flat affect Overall Cognitive Status: No family/caregiver present to determine baseline cognitive functioning Area of Impairment: Safety/judgement                         Safety/Judgement: Decreased awareness of deficits;Decreased awareness of safety            Exercises      General Comments        Pertinent Vitals/Pain Pain Assessment: No/denies pain Faces Pain Scale: Hurts little more Pain Location: lips and mouth are dry Pain Descriptors / Indicators: Discomfort Pain Intervention(s): Limited activity within patient's tolerance;Monitored during session;Repositioned    Home Living                      Prior Function            PT Goals (current goals can now be found in the care plan section) Progress towards PT goals: Progressing toward goals    Frequency    Min 2X/week      PT Plan Current plan remains appropriate    Co-evaluation              AM-PAC PT "6 Clicks" Mobility   Outcome Measure  Help needed turning  from your back to your side while in a flat bed without using bedrails?: A Little Help needed moving from lying on your back to sitting on the side of a flat bed without using bedrails?: A Little Help needed moving to and from a bed to a chair (including a wheelchair)?: A Little Help needed standing up from a chair using your arms (e.g., wheelchair or bedside chair)?: A Little Help needed to walk in hospital room?: A Lot Help needed climbing 3-5 steps with a railing? : Total 6 Click Score: 15    End of Session Equipment Utilized During Treatment: Gait belt Activity Tolerance: Patient limited by fatigue Patient left: in bed;with call bell/phone within reach;with bed alarm set   PT Visit Diagnosis: Other abnormalities of gait and  mobility (R26.89);Difficulty in walking, not elsewhere classified (R26.2);Muscle weakness (generalized) (M62.81)     Time: 1021-1173 PT Time Calculation (min) (ACUTE ONLY): 15 min  Charges:  $Gait Training: 8-22 mins                        Weston Anna, PT Acute Rehabilitation Services Pager: [phone] Office: [phone]

## 2018-12-02 NOTE — Progress Notes (Signed)
CRITICAL VALUE ALERT  Critical Value: WBC 0.9  Date & Time Notied:  12/02/18;0440  Provider Notified: Jonette Eva  Orders Received/Actions taken: Awaiting new orders

## 2018-12-02 NOTE — Consult Note (Signed)
[facility] ROUNDING NOTE   Subjective: No acute events overnight.  VSS, afebrile.  Continues to feel weak.  3 loose stools in the last 24 hours, without hematochezia or melena.  Denies abdominal pain.  Tolerating some liquids.  Objective: Vital signs in last 24 hours: Temp:  [98.1 F (36.7 C)-98.7 F (37.1 C)] 98.3 F (36.8 C) (03/09 1145) Pulse Rate:  [92-99] 92 (03/09 1145) Resp:  [16-22] 22 (03/09 1145) BP: (95-119)/(60-70) 111/60 (03/09 1145) SpO2:  [92 %-97 %] 92 % (03/09 1145) Weight:  [64.9 kg] 64.9 kg (03/09 0513) Last BM Date: 12/02/18 General: NAD Lungs: CTA bilaterally, no wheezes, rales, rhonchi Heart: RRR, no murmurs, rubs, gallops Abdomen: Soft, NT, ND, + BS Ext: No edema, cyanosis    Intake/Output from previous day: 03/08 0701 - 03/09 0700 In: 394.8 [P.O.:120; IV Piggyback:274.8] Out: 600 [Urine:600] Intake/Output this shift: Total I/O In: -  Out: 775 [Urine:775]   Lab Results: Recent Labs    11/30/18 0345 11/30/18 2356 12/01/18 0404 12/02/18 0324  WBC 0.4*  --  0.6* 0.9*  HGB 7.2* 7.8* 8.1* 7.3*  PLT 38*  --  61* 91*  MCV 90.3  --  90.3 90.9   BMET Recent Labs    11/30/18 0345 12/01/18 0404 12/02/18 0324  NA 141 143 147*  K 3.3* 3.5 3.4*  CL 112* 113* 116*  CO2 22 23 25   GLUCOSE 120* 98 95  BUN 8 8 10   CREATININE 0.49 0.47 0.50  CALCIUM 6.4* 7.5* 8.0*   LFT Recent Labs    11/30/18 0345 12/01/18 0404 12/02/18 0324  PROT 4.6* 4.7* 4.6*  ALBUMIN 1.7* 1.6* 1.6*  AST 39 50* 48*  ALT 56* 64* 65*  ALKPHOS 91 97 99  BILITOT 5.6* 6.7* 5.9*   PT/INR No results for input(s): INR in the last 72 hours.    Imaging/Other results: No results found.    Assessment and Plan:  53 year old female with recently diagnosed stage IV Hodgkin Lymphoma, started on chemotherapy in 11/19/2018 GI service consulted for elevated T bili.  I reviewed her previous labs and imaging studies today.  Normal LAE's in 04/2018.  Elevated  AST/ALT on admission on 11/11/2018 (190/62) with T bili 1.9 at that time.  That was the peak AST/ALT.  Since then AST/ALT have largely down trended but with subsequent increase in T bili to eventual peak of 6.7 yesterday.  Repeat today was 5.9.  RUQ [place] on 3/6 was essentially normal with a 3 mm CBD.  CT on 3/4 similarly demonstrates normal-appearing liver, GB, pancreas, GI tract.  No prior known history of hepatobiliary disease.  Viral hepatitis panel negative.  Of note, HBsAb was negative (no prior immunity).  Given overall clinical presentation, suspect elevated T bili multifactorial 2/2 cholestasis of sepsis/systemic injury along with cholestasis from antimicrobial therapy.  Imaging otherwise without obstruction, and persistently normal ALP further suggests not a primary biliary etiology.  - Resume daily LAE trend -No plan for endoscopy/ERCP at this time -Vitamin K as prescribed for mild coagulopathy - Given peak and likely downtrend, okay to continue current therapies -Resume p.o. vancomycin for C. difficile as outlined by ID. - GI service to follow peripherally at this time.  However please not hesitate to contact the on-call GI with additional questions or concerns.     Lavena Bullion, DO  12/02/2018, 1:03 PM [facility] Pager [phone]

## 2018-12-02 NOTE — Progress Notes (Signed)
  Speech Language Pathology Treatment: Dysphagia  Patient Details Name: Cindy Ward MRN: [id number] DOB: [birth year] Today's Date: 12/02/2018 Time: 1210-1225 SLP Time Calculation (min) (ACUTE ONLY): 15 min  Assessment / Plan / Recommendation Clinical Impression  Pt seen at bedside for follow up after BSE completed over the weekend. Pt with minimal verbalizations, low vocal intensity. Pt repositioned to upright, and accepted trials of thin liquid via cup. No overt s/s aspiration observed or reported. Pt reports difficulty with large pills, however, RN indicates pt did not want meds crushed. Recommendations from BSE was reiterated to pt, including upright position during all po intake. RN reports pt appears to tolerate whole pills with liquid. Anticipate being able to advance diet as oropharyngeal pain is reduced with resolution of thrush. ST will continue to follow to assess diet tolerance and provide education.    HPI HPI: 53 year old female with history of essential hypertension, hypothyroidism initially presented to [facility] with complaints of recurrent nausea, vomiting, fever and night sweats.  Upon extensive work-up she was diagnosed with Hodgkin's lymphoma, stage IV.  Oncology team was consulted and patient was transferred here for further care and management.  Lymph node biopsy was consistent with classic Hodgkin's lymphoma.  Hospital course has been complicated by bacteremia, intermittent encephalopathy, severe protein calorie malnutrition and electrolyte imbalance.  She is also been pancytopenic since she is on chemotherapy receiving Granix. Noted to have mild dysphagia with oral thrush, Nystatin and Diflucan started.      SLP Plan  Continue with current plan of care       Recommendations  Diet recommendations: (full liquid while pt has oropharyngeal pain with swallow) Liquids provided via: Cup(pt indicates she cannot use a straw) Medication Administration: Whole meds with  liquid Supervision: Patient able to self feed Compensations: Slow rate;Small sips/bites Postural Changes and/or Swallow Maneuvers: Seated upright 90 degrees                Oral Care Recommendations: Oral care BID Follow up Recommendations: None SLP Visit Diagnosis: Dysphagia, oropharyngeal phase (R13.12) Plan: Continue with current plan of care       GO              Elnor Renovato B. Quentin Ore [facility], CCC-SLP Speech Language Pathologist [phone]  Shonna Chock 12/02/2018, 12:29 PM

## 2018-12-03 DIAGNOSIS — B955 Unspecified streptococcus as the cause of diseases classified elsewhere: Secondary | ICD-10-CM

## 2018-12-03 LAB — CBC WITH DIFFERENTIAL/PLATELET
Abs Immature Granulocytes: 0.56 10*3/uL — ABNORMAL HIGH (ref 0.00–0.07)
BASOS PCT: 1 %
Basophils Absolute: 0 10*3/uL (ref 0.0–0.1)
Eosinophils Absolute: 0 10*3/uL (ref 0.0–0.5)
Eosinophils Relative: 0 %
HCT: 27.5 % — ABNORMAL LOW (ref 36.0–46.0)
Hemoglobin: 8.8 g/dL — ABNORMAL LOW (ref 12.0–15.0)
Immature Granulocytes: 29 %
Lymphocytes Relative: 28 %
Lymphs Abs: 0.5 10*3/uL — ABNORMAL LOW (ref 0.7–4.0)
MCH: 28.6 pg (ref 26.0–34.0)
MCHC: 32 g/dL (ref 30.0–36.0)
MCV: 89.3 fL (ref 80.0–100.0)
Monocytes Absolute: 0.2 10*3/uL (ref 0.1–1.0)
Monocytes Relative: 11 %
Neutro Abs: 0.6 10*3/uL — ABNORMAL LOW (ref 1.7–7.7)
Neutrophils Relative %: 31 %
Platelets: 107 10*3/uL — ABNORMAL LOW (ref 150–400)
RBC: 3.08 MIL/uL — ABNORMAL LOW (ref 3.87–5.11)
RDW: 17.2 % — ABNORMAL HIGH (ref 11.5–15.5)
WBC: 2 10*3/uL — ABNORMAL LOW (ref 4.0–10.5)
nRBC: 2 % — ABNORMAL HIGH (ref 0.0–0.2)

## 2018-12-03 LAB — HEPATITIS PANEL, ACUTE
HCV AB: 0.1 {s_co_ratio} (ref 0.0–0.9)
Hep A IgM: NEGATIVE
Hep B C IgM: NEGATIVE
Hepatitis B Surface Ag: NEGATIVE

## 2018-12-03 LAB — TYPE AND SCREEN
ABO/RH(D): AB NEG
Antibody Screen: NEGATIVE
Unit division: 0
Unit division: 0

## 2018-12-03 LAB — CULTURE, BLOOD (ROUTINE X 2)
CULTURE: NO GROWTH
Culture: NO GROWTH
SPECIAL REQUESTS: ADEQUATE
Special Requests: ADEQUATE

## 2018-12-03 LAB — BPAM RBC
Blood Product Expiration Date: 202004042359
Blood Product Expiration Date: 202004042359
ISSUE DATE / TIME: 202003071131
ISSUE DATE / TIME: 202003091120
UNIT TYPE AND RH: 600
Unit Type and Rh: 600

## 2018-12-03 LAB — COMPREHENSIVE METABOLIC PANEL
ALT: 66 U/L — ABNORMAL HIGH (ref 0–44)
AST: 41 U/L (ref 15–41)
Albumin: 1.8 g/dL — ABNORMAL LOW (ref 3.5–5.0)
Alkaline Phosphatase: 132 U/L — ABNORMAL HIGH (ref 38–126)
Anion gap: 10 (ref 5–15)
BUN: 11 mg/dL (ref 6–20)
CO2: 26 mmol/L (ref 22–32)
Calcium: 8.6 mg/dL — ABNORMAL LOW (ref 8.9–10.3)
Chloride: 111 mmol/L (ref 98–111)
Creatinine, Ser: 0.57 mg/dL (ref 0.44–1.00)
GFR calc Af Amer: >60 mL/min (ref >60)
Glucose, Bld: 94 mg/dL (ref 70–99)
Potassium: 2.9 mmol/L — ABNORMAL LOW (ref 3.5–5.1)
Sodium: 147 mmol/L — ABNORMAL HIGH (ref 135–145)
Total Bilirubin: 4.2 mg/dL — ABNORMAL HIGH (ref 0.3–1.2)
Total Protein: 4.8 g/dL — ABNORMAL LOW (ref 6.5–8.1)

## 2018-12-03 LAB — BASIC METABOLIC PANEL
Anion gap: 9 (ref 5–15)
BUN: 10 mg/dL (ref 6–20)
CO2: 25 mmol/L (ref 22–32)
Calcium: 8 mg/dL — ABNORMAL LOW (ref 8.9–10.3)
Chloride: 111 mmol/L (ref 98–111)
Creatinine, Ser: 0.54 mg/dL (ref 0.44–1.00)
GFR calc Af Amer: >60 mL/min (ref >60)
GFR calc non Af Amer: >60 mL/min (ref >60)
Glucose, Bld: 79 mg/dL (ref 70–99)
Potassium: 3.1 mmol/L — ABNORMAL LOW (ref 3.5–5.1)
Sodium: 145 mmol/L (ref 135–145)

## 2018-12-03 LAB — MAGNESIUM: Magnesium: 1.7 mg/dL (ref 1.7–2.4)

## 2018-12-03 MED ORDER — POTASSIUM CHLORIDE 10 MEQ/100ML IV SOLN
10.0000 meq | INTRAVENOUS | Status: AC
  Administered 2018-12-03 (×3): 10 meq via INTRAVENOUS
  Filled 2018-12-03 (×3): qty 100

## 2018-12-03 MED ORDER — POTASSIUM CHLORIDE CRYS ER 20 MEQ PO TBCR
40.0000 meq | EXTENDED_RELEASE_TABLET | Freq: Four times a day (QID) | ORAL | Status: AC
  Filled 2018-12-03: qty 2

## 2018-12-03 NOTE — Plan of Care (Signed)
  Problem: Clinical Measurements: Goal: Cardiovascular complication will be avoided Outcome: Progressing   Problem: Activity: Goal: Risk for activity intolerance will decrease Outcome: Progressing   Problem: Safety: Goal: Ability to remain free from injury will improve Outcome: Progressing   

## 2018-12-03 NOTE — Progress Notes (Signed)
Potassium level 2.9. Also, patient bleeding vaginally and patient stated she has been postmenopausal x10 years. Paged on-call.

## 2018-12-03 NOTE — Progress Notes (Signed)
     [facility] Progress Note  CC:  Elevated bilirubin   Subjective: Patient difficult to arouse this am. She denies having any abdominal pain. Reported having 4 to 5 diarrhea BMs since yesterday.   Objective:  Vital signs in last 24 hours: Temp:  [97.6 F (36.4 C)-98.4 F (36.9 C)] 97.7 F (36.5 C) (03/10 0500) Pulse Rate:  [86-93] 87 (03/10 0500) Resp:  [15-22] 16 (03/10 0500) BP: (106-126)/(60-82) 119/77 (03/10 0500) SpO2:  [92 %-97 %] 96 % (03/10 0500) Last BM Date: 12/02/18 General: lethargic but arousable in NAD Eyes: sclera with mild icterus  Heart:  Regular rate and rhythm; no murmurs Pulm: clear throughout.  Abdomen:  Soft, nontender and nondistended. Normal bowel sounds, without guarding, and without rebound.  Lg tattoo.  Extremities:  Without edema. Neurologic:  Alert and  oriented x 3;   Intake/Output from previous day: 03/09 0701 - 03/10 0700 In: 1295 [P.O.:870; Blood:325; IV Piggyback:100] Out: 1396 [Urine:1396] Intake/Output this shift: No intake/output data recorded.  Lab Results: Recent Labs    12/01/18 0404 12/02/18 0324 12/03/18 0343  WBC 0.6* 0.9* 2.0*  HGB 8.1* 7.3* 8.8*  HCT 26.0* 24.1* 27.5*  PLT 61* 91* 107*   BMET Recent Labs    12/01/18 0404 12/02/18 0324 12/03/18 0343  NA 143 147* 147*  K 3.5 3.4* 2.9*  CL 113* 116* 111  CO2 '23 25 26  '$ GLUCOSE 98 95 94  BUN '8 10 11  '$ CREATININE 0.47 0.50 0.57  CALCIUM 7.5* 8.0* 8.6*   LFT Recent Labs    12/03/18 0343  PROT 4.8*  ALBUMIN 1.8*  AST 41  ALT 66*  ALKPHOS 132*  BILITOT 4.2*   PT/INR No results for input(s): LABPROT, INR in the last 72 hours. Hepatitis Panel Recent Labs    12/01/18 1145  HEPBSAG Negative  HCVAB 0.1  HEPAIGM Negative  HEPBIGM Negative    No results found.  Assessment / Plan:  1. 53 y.o. female with stave  IV Hodgkin Lymphoma, started on chemotherapy (CTX-AVD + Adcedris, granix (2-25).) in 11/19/2018 with elevated LFTs. RUQ [place] on 3/6  was essentially normal with a 3 mm CBD, patent portal vein.  CT on 11/27/2018 identified a normal-appearing liver, GB, pancreas, GI tract.  No prior known history of hepatobiliary disease.  Acute hep panel negative. Elevated bilirubin and LFTs most likely due to sepsis cholestasis vs chemo vs antimicrobial tx. Bili and LFTs rending downward. T. Bili 4.2.  AST 41. ALT 66.  Alk phos 132 up from 99. WBC 2.0. Hg 8.8. HCT 27.5. PLT 107. -monitor daily hepatic panel (alk phos increased with decreasing t. bili, AST and ALT) -if t. Bili and alk phos increase to consider MRCP    2. C. Diff PCR positive 11/27/2018.  Day 6 po Vanco -ID following  -full liquid diet for now -IV fluid per hospitalist   Further recommendations per Dr. Bryan Lemma, will sign off service today  LOS: 22 days   Noralyn Pick  12/03/2018, 9:36 AM

## 2018-12-03 NOTE — Progress Notes (Signed)
[facility] INPATIENT PROGRESS NOTE  SUBJECTIVE: Cindy Ward 53 y.o. female with stage IV Hodgkin lymphoma.  Status post day 1 cycle 1 AVD plus Adcetris on 11/19/2018.  Remains afebrile.  Remains on ceftriaxone for bacteremia.  Repeat blood cultures were negative.  Denies abdominal pain, nausea, vomiting, diarrhea.  ALLERGIES:  has No Known Allergies.  MEDICATIONS:  Current Facility-Administered Medications  Medication Dose Route Frequency Provider Last Rate Last Dose  . 0.9 %  sodium chloride infusion   Intravenous PRN Rai, Ripudeep K, MD 10 mL/hr at 11/30/18 1420 1,000 mL at 11/30/18 1420  . 0.9 %  sodium chloride infusion   Intravenous Once Rai, Ripudeep K, MD      . acetaminophen (TYLENOL) tablet 650 mg  650 mg Oral Q6H PRN Rai, Ripudeep K, MD   650 mg at 11/25/18 0811   Or  . acetaminophen (TYLENOL) suppository 650 mg  650 mg Rectal Q6H PRN Rai, Ripudeep K, MD   650 mg at 11/25/18 1509  . cefTRIAXone (ROCEPHIN) 2 g in sodium chloride 0.9 % 100 mL IVPB  2 g Intravenous QHS Campbell Riches, MD   Stopped at 12/02/18 2253  . Chlorhexidine Gluconate Cloth 2 % PADS 6 each  6 each Topical Daily Rai, Ripudeep K, MD   6 each at 12/02/18 1226  . feeding supplement (BOOST / RESOURCE BREEZE) liquid 1 Container  1 Container Oral Daily PRN Rai, Ripudeep K, MD      . HYDROmorphone (DILAUDID) injection 0.5-1 mg  0.5-1 mg Intravenous Q4H PRN Rai, Ripudeep K, MD   1 mg at 11/26/18 1058  . ibuprofen (ADVIL,MOTRIN) tablet 400 mg  400 mg Oral Q8H PRN Rai, Ripudeep K, MD   400 mg at 11/18/18 2334  . iohexol (OMNIPAQUE) 300 MG/ML solution 15 mL  15 mL Oral Once PRN Rai, Ripudeep K, MD   30 mL at 11/18/18 1740  . ipratropium-albuterol (DUONEB) 0.5-2.5 (3) MG/3ML nebulizer solution 3 mL  3 mL Nebulization Q4H PRN Amin, Ankit Chirag, MD      . levothyroxine (SYNTHROID, LEVOTHROID) injection 37.5 mcg  37.5 mcg Intravenous Daily Rai, Ripudeep K, MD   37.5 mcg at 12/02/18 1013  . lip balm (CARMEX)  ointment   Topical PRN Rai, Ripudeep K, MD      . MEDLINE mouth rinse  15 mL Mouth Rinse BID Rai, Ripudeep K, MD   15 mL at 12/02/18 2210  . multivitamin with minerals tablet 1 tablet  1 tablet Oral Daily Amin, Ankit Chirag, MD   1 tablet at 12/02/18 1015  . nebivolol (BYSTOLIC) tablet 10 mg  10 mg Oral Daily Amin, Ankit Chirag, MD   10 mg at 12/02/18 1017  . nystatin (MYCOSTATIN) 100000 UNIT/ML suspension 500,000 Units  5 mL Oral QID Rai, Ripudeep K, MD   500,000 Units at 12/02/18 2151  . ondansetron (ZOFRAN) tablet 4 mg  4 mg Oral Q6H PRN Rai, Ripudeep K, MD       Or  . ondansetron (ZOFRAN) injection 4 mg  4 mg Intravenous Q6H PRN Rai, Ripudeep K, MD   4 mg at 11/28/18 1740  . oxyCODONE (Oxy IR/ROXICODONE) immediate release tablet 5-10 mg  5-10 mg Oral Q4H PRN Rai, Ripudeep K, MD   5 mg at 11/28/18 1739  . phenol (CHLORASEPTIC) mouth spray 1 spray  1 spray Mouth/Throat PRN Rai, Ripudeep K, MD      . phytonadione (VITAMIN K) SQ injection 10 mg  10 mg Subcutaneous Daily Lemmon,  Lavone Nian, PA   10 mg at 12/02/18 1017  . potassium chloride SA (K-DUR,KLOR-CON) CR tablet 40 mEq  40 mEq Oral Q6H Schorr, Rhetta Mura, NP      . protein supplement (UNJURY CHICKEN SOUP) powder 8 oz  8 oz Oral BID PRN Rai, Ripudeep K, MD      . scopolamine (TRANSDERM-SCOP) 1 MG/3DAYS 1.5 mg  1 patch Transdermal Q72H Rai, Ripudeep K, MD   1.5 mg at 11/30/18 1428  . sodium chloride flush (NS) 0.9 % injection 10-40 mL  10-40 mL Intracatheter PRN Rai, Ripudeep K, MD      . Tbo-Filgrastim [facility]) injection 480 mcg  480 mcg Subcutaneous q1800 Tish Men, MD   480 mcg at 12/02/18 1816  . vancomycin (VANCOCIN) 50 mg/mL oral solution 125 mg  125 mg Oral QID Damita Lack, MD   125 mg at 12/02/18 2210    REVIEW OF SYSTEMS:   Review of Systems  Review of Systems  Constitutional: Positive for malaise/fatigue. Negative for chills and fever.  HENT: Negative.   Eyes: Negative.   Respiratory: Negative.   Cardiovascular:  Negative.   Gastrointestinal: Negative.   Genitourinary: Negative.   Musculoskeletal: Negative.   Skin: Negative.   Neurological: Negative.   Endo/Heme/Allergies: Negative.   Psychiatric/Behavioral: Negative.       PHYSICAL EXAMINATION:  Vital signs in last 24 hours: Temp:  [97.6 F (36.4 C)-98.4 F (36.9 C)] 97.7 F (36.5 C) (03/10 0500) Pulse Rate:  [86-93] 87 (03/10 0500) Resp:  [15-22] 16 (03/10 0500) BP: (106-126)/(60-82) 119/77 (03/10 0500) SpO2:  [92 %-97 %] 96 % (03/10 0500) Weight change:  Last BM Date: 12/02/18  ECOG PERFORMANCE STATUS: 1 - Symptomatic but completely ambulatory  Intake/Output from previous day: 03/09 0701 - 03/10 0700 In: 1295 [P.O.:870; Blood:325; IV Piggyback:100] Out: 1396 [Urine:1396] General: Alert, awake without distress. Head: Normocephalic atraumatic. Mouth: mucous membranes moist, pharynx normal without lesions Eyes: No scleral icterus.  Pupils are equal and round reactive to light. Resp: clear to auscultation bilaterally without rhonchi or wheezes or dullness to percussion. Cardio: regular rate and rhythm, S1, S2 normal, no murmur, click, rub or gallop GI: soft, non-tender; bowel sounds normal; no masses,  no organomegaly Musculoskeletal: No joint deformity or effusion. Neurological: No motor, sensory deficits. Skin: No rashes or lesions.   LABORATORY DATA: Lab Results  Component Value Date   WBC 2.0 (L) 12/03/2018   HGB 8.8 (L) 12/03/2018   HCT 27.5 (L) 12/03/2018   MCV 89.3 12/03/2018   PLT 107 (L) 12/03/2018    CMP Latest Ref Rng & Units 12/03/2018 12/02/2018 12/01/2018  Glucose 70 - 99 mg/dL 94 95 98  BUN 6 - 20 mg/dL _0 Creatinine 0.44 - 1.00 mg/dL 0.57 0.50 0.47  Sodium 135 - 145 mmol/L 147(H) 147(H) 143  Potassium 3.5 - 5.1 mmol/L 2.9(L) 3.4(L) 3.5  Chloride 98 - 111 mmol/L 111 116(H) 113(H)  CO2 22 - 32 mmol/L _1 Calcium 8.9 - 10.3 mg/dL 8.6(L) 8.0(L) 7.5(L)  Total Protein 6.5 - 8.1 g/dL 4.8(L) 4.6(L)  4.7(L)  Total Bilirubin 0.3 - 1.2 mg/dL 4.2(H) 5.9(H) 6.7(H)  Alkaline Phos 38 - 126 U/L 132(H) 99 97  AST 15 - 41 U/L 41 48(H) 50(H)  ALT 0 - 44 U/L 66(H) 65(H) 64(H)    RADIOGRAPHIC STUDIES:  Dg Chest 1 View  Result Date: 11/26/2018 CLINICAL DATA:  Tachypnea.  Stage IV Hodgkin's lymphoma. EXAM: CHEST  1 VIEW COMPARISON:  Chest radiograph November 25, 2018 FINDINGS: Cardiac silhouette is mildly enlarged and unchanged. Pulmonary vascular congestion with small LEFT pleural effusion. No pneumothorax. Strandy densities LEFT lung base. Surgical clips LEFT axilla. RIGHT single-lumen chest Port-A-Cath distal tip projecting mid superior vena cava. Osseous structures are unchanged. IMPRESSION: Stable cardiomegaly and pulmonary vascular congestion. Small LEFT pleural effusion and LEFT lung base atelectasis. Electronically Signed   By: Elon Alas M.D.   On: 11/26/2018 20:32   Dg Chest 2 View  Result Date: 11/21/2018 CLINICAL DATA:  History of lymphoma.  Patient weak and flushed. EXAM: CHEST - 2 VIEW COMPARISON:  Chest CT, 11/18/2018.  Chest radiographs, 11/11/2018. FINDINGS: Mild enlargement of the cardiopericardial silhouette. No mediastinal or hilar masses. No convincing adenopathy. Small bilateral pleural effusions. Mild dependent lower lobe atelectasis. Lungs otherwise clear. No pneumothorax. Right anterior chest wall, internal jugular, Port-A-Cath is stable from the prior chest CT. Skeletal structures are unremarkable. IMPRESSION: 1. Findings are similar to the recent prior chest CT. Enlargement of the cardiopericardial silhouette is consistent with the pericardial effusion noted on CT. There are small bilateral pleural effusions with associated lower lobe atelectasis. No convincing pneumonia and no pulmonary edema. Electronically Signed   By: Lajean Manes M.D.   On: 11/21/2018 15:05   Ct Head Wo Contrast  Result Date: 11/25/2018 CLINICAL DATA:  Hodgkin's lymphoma, fevers, headache EXAM: CT HEAD  WITHOUT CONTRAST TECHNIQUE: Contiguous axial images were obtained from the base of the skull through the vertex without intravenous contrast. COMPARISON:  11/25/2018 FINDINGS: Brain: No evidence of acute infarction, hemorrhage, hydrocephalus, extra-axial collection or mass lesion/mass effect. Mild periventricular white matter hypodensity. Vascular: No hyperdense vessel or unexpected calcification. Skull: Normal. Negative for fracture or focal lesion. Sinuses/Orbits: No acute finding. Other: None. IMPRESSION: No acute intracranial pathology. No non-contrast CT findings to explain headache. Mild small-vessel white matter disease. Electronically Signed   By: Eddie Candle M.D.   On: 11/25/2018 12:37   Ct Soft Tissue Neck W Contrast  Result Date: 11/18/2018 CLINICAL DATA:  Initial evaluation for Hodgkin's lymphoma, initial workup. EXAM: CT NECK WITH CONTRAST CT CHEST, ABDOMEN, AND PELVIS WITH CONTRAST TECHNIQUE: Multidetector CT imaging of the chest, abdomen and pelvis was performed following the standard protocol during bolus administration of intravenous contrast. CONTRAST:  125m OMNIPAQUE IOHEXOL 300 MG/ML SOLN, 377mOMNIPAQUE IOHEXOL 300 MG/ML SOLN COMPARISON:  None. FINDINGS: CT NECK FINDINGS: Pharynx and larynx: Oral cavity within normal limits without mass lesion or loculated collection. Patient is largely edentulous. Calcified tonsilliths noted within the right palatine tonsil. Tonsils themselves symmetric and within normal limits bilaterally. Parapharyngeal fat maintained. Nasopharynx normal. No retropharyngeal collection. Epiglottis normal. Vallecula clear. Remainder of the hypopharynx and supraglottic larynx normal. Glottis within normal limits. Subglottic airway clear. Salivary glands: Parotid and submandibular glands within normal limits. Thyroid: Thyroid diffusely enlarged without discrete nodule or mass. Lymph nodes: Mildly prominent level II lymph nodes measure up to 9 mm in short axis bilaterally.  Left level III nodes measure up to 9 mm as well. Increased number of shotty subcentimeter nodes within the neck bilaterally, left slightly worse than right. No other pathologically enlarged lymph nodes identified within the neck. Vascular: Normal intravascular enhancement seen throughout the neck. Right IJ approach central venous catheter in place. Soft tissue swelling with stranding and scattered foci of soft tissue emphysema within the right neck likely related to central line placement. Limited intracranial: Unremarkable Visualized orbits: Partially visualized inferior globes and orbits unremarkable. Mastoids and visualized paranasal sinuses: Mucosal thickening noted  within the visualized maxillary and sphenoid sinuses. Visualized paranasal sinuses are otherwise clear. Visualize mastoids and middle ear cavities are clear. Skeleton: No acute osseous abnormality. No discrete lytic or blastic osseous lesions. Mild cervical spondylolysis present at C5-6. Other: None. CT CHEST FINDINGS Cardiovascular: Normal intravascular enhancement seen throughout the intra-abdominal aorta without aneurysm or other acute abnormality. Minimal atheromatous plaque within the aortic arch. Visualized great vessels within normal limits. Heart size normal. Small moderate pericardial effusion, simple fluid density. Mediastinum/Nodes: Enlarged nodes within the upper mediastinum measure up to 15 mm in short axis (series 3, image 11). No appreciable supraclavicular adenopathy. No other pathologically enlarged mediastinal lymph nodes. Mild soft tissue density within the right hilum without frank adenopathy. Mildly enlarged 11 mm left hilar node (series 3, image 28). Enlarged axillary adenopathy seen bilaterally, with the largest node on the left measuring 2.1 cm in short axis (series 3, image 16). Largest node on the right measures 1.7 cm in short axis (series 3, image 8). Scattered soft tissue stranding with emphysema and fluid density  overlying the left axilla likely related to recent lymph node biopsy, partially visualized. Esophagus within normal limits. Lungs/Pleura: Tracheobronchial tree intact and patent. Moderate layering bilateral pleural effusions with associated atelectasis. No other airspace consolidation. No pulmonary edema. No pneumothorax. 4 mm subpleural ground-glass nodule present at the anterior right upper lobe (series 4, image 57), indeterminate. No other worrisome pulmonary nodule or mass. Musculoskeletal: No acute osseous finding. No discrete lytic or blastic osseous lesions. CT ABDOMEN PELVIS FINDINGS Hepatobiliary: Liver demonstrates a normal contrast enhanced appearance. Gallbladder within normal limits. No biliary dilatation. Pancreas: Pancreas within normal limits. Spleen: Spleen enlarged measuring 14.3 cm in craniocaudad dimension. Few subtle hypodensities noted within the spleen, largest of which measures approximately 12 mm (series 3, image 57), indeterminate. Adrenals/Urinary Tract: Adrenal glands are normal. Kidneys equal in size with symmetric enhancement. No nephrolithiasis, hydronephrosis, or focal enhancing renal mass. No hydroureter. Bladder within normal limits. Stomach/Bowel: Stomach within normal limits. No evidence for bowel obstruction. Normal appendix. No acute inflammatory changes seen about the bowels. Vascular/Lymphatic: Normal intravascular enhancement seen throughout the intra-abdominal aorta. Mild aorto bi-iliac atherosclerotic disease. No aneurysm. Mesenteric vessels patent proximally. Enlarged 15 mm aortocaval lymph node (series 3, image 7). Additional multifocal shotty subcentimeter aortocaval and periaortic lymph nodes noted. No other pathologically enlarged intra-abdominal lymph nodes identified. Mildly prominent 12 mm left inguinal lymph nodes noted. No other adenopathy within the pelvis. Reproductive: Uterus within normal limits.  Ovaries normal. Other: Moderate volume free fluid within the  pelvis, measuring simple fluid density. No free intraperitoneal air. Musculoskeletal: Scattered anasarca noted within the external soft tissues. No acute osseous finding. No discrete lytic or blastic osseous lesions. IMPRESSION: CT NECK IMPRESSION 1. Increased number of shoddy subcentimeter lymph nodes throughout the neck bilaterally, left slightly worse than right. No pathologically enlarged lymph nodes identified. 2. Mild diffuse thyromegaly without discrete thyroid nodule or mass. 3. Mild soft tissue stranding with emphysema within the right lateral neck related to recent right-sided Port-A-Cath placement. CT CHEST IMPRESSION 1. Enlarged bilateral axillary and upper mediastinal adenopathy as above, likely related to provided history of lymphoma. Additional borderline enlarged left hilar node may be related to lymphoma or possibly reactive in nature. Attention at follow-up recommended. 2. Moderate layering bilateral pleural effusions with associated atelectasis. 3. Small to moderate pericardial effusion. 4. 4 mm right upper lobe ground-glass nodule, indeterminate. Attention at follow-up recommended. CT ABDOMEN AND PELVIS IMPRESSION 1. Enlarged 15 mm aortocaval lymph node with  mildly enlarged 12 mm left inguinal lymph nodes. Additional increased number of shotty subcentimeter retroperitoneal lymph nodes. Findings most likely related to history of lymphoma. 2. Splenomegaly. 3. Moderate volume free fluid within the pelvis, of uncertain etiology, but could be physiologic and/or related overall volume status. 4. Mild diffuse anasarca. Electronically Signed   By: Jeannine Boga M.D.   On: 11/18/2018 19:56   Ct Chest W Contrast  Result Date: 11/18/2018 CLINICAL DATA:  Initial evaluation for Hodgkin's lymphoma, initial workup. EXAM: CT NECK WITH CONTRAST CT CHEST, ABDOMEN, AND PELVIS WITH CONTRAST TECHNIQUE: Multidetector CT imaging of the chest, abdomen and pelvis was performed following the standard protocol  during bolus administration of intravenous contrast. CONTRAST:  19m OMNIPAQUE IOHEXOL 300 MG/ML SOLN, 386mOMNIPAQUE IOHEXOL 300 MG/ML SOLN COMPARISON:  None. FINDINGS: CT NECK FINDINGS: Pharynx and larynx: Oral cavity within normal limits without mass lesion or loculated collection. Patient is largely edentulous. Calcified tonsilliths noted within the right palatine tonsil. Tonsils themselves symmetric and within normal limits bilaterally. Parapharyngeal fat maintained. Nasopharynx normal. No retropharyngeal collection. Epiglottis normal. Vallecula clear. Remainder of the hypopharynx and supraglottic larynx normal. Glottis within normal limits. Subglottic airway clear. Salivary glands: Parotid and submandibular glands within normal limits. Thyroid: Thyroid diffusely enlarged without discrete nodule or mass. Lymph nodes: Mildly prominent level II lymph nodes measure up to 9 mm in short axis bilaterally. Left level III nodes measure up to 9 mm as well. Increased number of shotty subcentimeter nodes within the neck bilaterally, left slightly worse than right. No other pathologically enlarged lymph nodes identified within the neck. Vascular: Normal intravascular enhancement seen throughout the neck. Right IJ approach central venous catheter in place. Soft tissue swelling with stranding and scattered foci of soft tissue emphysema within the right neck likely related to central line placement. Limited intracranial: Unremarkable Visualized orbits: Partially visualized inferior globes and orbits unremarkable. Mastoids and visualized paranasal sinuses: Mucosal thickening noted within the visualized maxillary and sphenoid sinuses. Visualized paranasal sinuses are otherwise clear. Visualize mastoids and middle ear cavities are clear. Skeleton: No acute osseous abnormality. No discrete lytic or blastic osseous lesions. Mild cervical spondylolysis present at C5-6. Other: None. CT CHEST FINDINGS Cardiovascular: Normal  intravascular enhancement seen throughout the intra-abdominal aorta without aneurysm or other acute abnormality. Minimal atheromatous plaque within the aortic arch. Visualized great vessels within normal limits. Heart size normal. Small moderate pericardial effusion, simple fluid density. Mediastinum/Nodes: Enlarged nodes within the upper mediastinum measure up to 15 mm in short axis (series 3, image 11). No appreciable supraclavicular adenopathy. No other pathologically enlarged mediastinal lymph nodes. Mild soft tissue density within the right hilum without frank adenopathy. Mildly enlarged 11 mm left hilar node (series 3, image 28). Enlarged axillary adenopathy seen bilaterally, with the largest node on the left measuring 2.1 cm in short axis (series 3, image 16). Largest node on the right measures 1.7 cm in short axis (series 3, image 8). Scattered soft tissue stranding with emphysema and fluid density overlying the left axilla likely related to recent lymph node biopsy, partially visualized. Esophagus within normal limits. Lungs/Pleura: Tracheobronchial tree intact and patent. Moderate layering bilateral pleural effusions with associated atelectasis. No other airspace consolidation. No pulmonary edema. No pneumothorax. 4 mm subpleural ground-glass nodule present at the anterior right upper lobe (series 4, image 57), indeterminate. No other worrisome pulmonary nodule or mass. Musculoskeletal: No acute osseous finding. No discrete lytic or blastic osseous lesions. CT ABDOMEN PELVIS FINDINGS Hepatobiliary: Liver demonstrates a normal contrast enhanced appearance.  Gallbladder within normal limits. No biliary dilatation. Pancreas: Pancreas within normal limits. Spleen: Spleen enlarged measuring 14.3 cm in craniocaudad dimension. Few subtle hypodensities noted within the spleen, largest of which measures approximately 12 mm (series 3, image 57), indeterminate. Adrenals/Urinary Tract: Adrenal glands are normal.  Kidneys equal in size with symmetric enhancement. No nephrolithiasis, hydronephrosis, or focal enhancing renal mass. No hydroureter. Bladder within normal limits. Stomach/Bowel: Stomach within normal limits. No evidence for bowel obstruction. Normal appendix. No acute inflammatory changes seen about the bowels. Vascular/Lymphatic: Normal intravascular enhancement seen throughout the intra-abdominal aorta. Mild aorto bi-iliac atherosclerotic disease. No aneurysm. Mesenteric vessels patent proximally. Enlarged 15 mm aortocaval lymph node (series 3, image 7). Additional multifocal shotty subcentimeter aortocaval and periaortic lymph nodes noted. No other pathologically enlarged intra-abdominal lymph nodes identified. Mildly prominent 12 mm left inguinal lymph nodes noted. No other adenopathy within the pelvis. Reproductive: Uterus within normal limits.  Ovaries normal. Other: Moderate volume free fluid within the pelvis, measuring simple fluid density. No free intraperitoneal air. Musculoskeletal: Scattered anasarca noted within the external soft tissues. No acute osseous finding. No discrete lytic or blastic osseous lesions. IMPRESSION: CT NECK IMPRESSION 1. Increased number of shoddy subcentimeter lymph nodes throughout the neck bilaterally, left slightly worse than right. No pathologically enlarged lymph nodes identified. 2. Mild diffuse thyromegaly without discrete thyroid nodule or mass. 3. Mild soft tissue stranding with emphysema within the right lateral neck related to recent right-sided Port-A-Cath placement. CT CHEST IMPRESSION 1. Enlarged bilateral axillary and upper mediastinal adenopathy as above, likely related to provided history of lymphoma. Additional borderline enlarged left hilar node may be related to lymphoma or possibly reactive in nature. Attention at follow-up recommended. 2. Moderate layering bilateral pleural effusions with associated atelectasis. 3. Small to moderate pericardial effusion. 4.  4 mm right upper lobe ground-glass nodule, indeterminate. Attention at follow-up recommended. CT ABDOMEN AND PELVIS IMPRESSION 1. Enlarged 15 mm aortocaval lymph node with mildly enlarged 12 mm left inguinal lymph nodes. Additional increased number of shotty subcentimeter retroperitoneal lymph nodes. Findings most likely related to history of lymphoma. 2. Splenomegaly. 3. Moderate volume free fluid within the pelvis, of uncertain etiology, but could be physiologic and/or related overall volume status. 4. Mild diffuse anasarca. Electronically Signed   By: Jeannine Boga M.D.   On: 11/18/2018 19:56   Ct Abdomen Pelvis W Contrast  Result Date: 11/27/2018 CLINICAL DATA:  Inpatient. Non-Hodgkin lymphoma. Ongoing chemotherapy. Abdominal pain. Rising bilirubin. EXAM: CT ABDOMEN AND PELVIS WITH CONTRAST TECHNIQUE: Multidetector CT imaging of the abdomen and pelvis was performed using the standard protocol following bolus administration of intravenous contrast. CONTRAST:  137m OMNIPAQUE IOHEXOL 300 MG/ML  SOLN COMPARISON:  11/18/2018 CT chest, abdomen and pelvis. FINDINGS: Lower chest: Small dependent bilateral pleural effusions with passive dependent bibasilar atelectasis, unchanged. Small pericardial effusion appears stable. Hepatobiliary: Normal liver size. No liver mass. Normal gallbladder with no radiopaque cholelithiasis. No biliary ductal dilatation. Pancreas: Normal, with no mass or duct dilation. Spleen: Spleen has decreased and is now normal in size. Craniocaudal splenic length 11.4 cm, previously 13.3 cm. No splenic mass. Adrenals/Urinary Tract: Normal adrenals. Mild bilateral hydroureteronephrosis to the level of the ureterovesical junction. Symmetric mildly delayed contrast nephrograms bilaterally. New vague small low-attenuation 1.6 cm renal cortical focus in the lateral upper left kidney (series 7/image 12). Otherwise no renal lesions. Markedly distended and otherwise normal bladder. Stomach/Bowel:  Normal non-distended stomach. Normal caliber small bowel with no small bowel wall thickening. Normal appendix. Normal large bowel with no  diverticulosis, large bowel wall thickening or pericolonic fat stranding. Vascular/Lymphatic: Atherosclerotic nonaneurysmal abdominal aorta. Patent portal, splenic, hepatic and renal veins. Left inguinal lymphadenopathy is decreased, for example previously 1.2 cm, now 0.9 cm (series 2/image 84). Retroperitoneal adenopathy in aortocaval and left para-aortic chains has decreased. Representative 0.8 cm aortocaval node (series 2/image 31), previously 1.2 cm. Representative 0.8 cm left para-aortic node (series 2/image 30), previously 1.2 cm. No pathologically enlarged abdominopelvic nodes. Reproductive: Grossly normal uterus.  No adnexal mass. Other: Trace free fluid in pelvic cul-de-sac. No focal fluid collection. No pneumoperitoneum. Mild anasarca, similar. Musculoskeletal: No aggressive appearing focal osseous lesions. Mild lumbar spondylosis. IMPRESSION: 1. Acute urinary retention with massively distended urinary bladder and mild bilateral hydroureteronephrosis. Recommend urgent bladder catheterization. 2. Evidence of treatment response. Spleen is decreased and now normal in size. Retroperitoneal and left inguinal lymphadenopathy is decreased, with no residual pathologically enlarged abdominopelvic nodes. 3. Small dependent bilateral pleural effusions with passive dependent bibasilar atelectasis, stable. 4. Stable small pericardial effusion. 5. Aortic Atherosclerosis (ICD10-I70.0). These results were called by telephone at the time of interpretation on 11/27/2018 at 11:07 am to [place], who verbally acknowledged these results. Per the RN, a bladder catheterization was just completed with 2075 cc of urine drained. Electronically Signed   By: Ilona Sorrel M.D.   On: 11/27/2018 11:31   Ct Abdomen Pelvis W Contrast  Result Date: 11/18/2018 CLINICAL DATA:  Initial evaluation  for Hodgkin's lymphoma, initial workup. EXAM: CT NECK WITH CONTRAST CT CHEST, ABDOMEN, AND PELVIS WITH CONTRAST TECHNIQUE: Multidetector CT imaging of the chest, abdomen and pelvis was performed following the standard protocol during bolus administration of intravenous contrast. CONTRAST:  161m OMNIPAQUE IOHEXOL 300 MG/ML SOLN, 336mOMNIPAQUE IOHEXOL 300 MG/ML SOLN COMPARISON:  None. FINDINGS: CT NECK FINDINGS: Pharynx and larynx: Oral cavity within normal limits without mass lesion or loculated collection. Patient is largely edentulous. Calcified tonsilliths noted within the right palatine tonsil. Tonsils themselves symmetric and within normal limits bilaterally. Parapharyngeal fat maintained. Nasopharynx normal. No retropharyngeal collection. Epiglottis normal. Vallecula clear. Remainder of the hypopharynx and supraglottic larynx normal. Glottis within normal limits. Subglottic airway clear. Salivary glands: Parotid and submandibular glands within normal limits. Thyroid: Thyroid diffusely enlarged without discrete nodule or mass. Lymph nodes: Mildly prominent level II lymph nodes measure up to 9 mm in short axis bilaterally. Left level III nodes measure up to 9 mm as well. Increased number of shotty subcentimeter nodes within the neck bilaterally, left slightly worse than right. No other pathologically enlarged lymph nodes identified within the neck. Vascular: Normal intravascular enhancement seen throughout the neck. Right IJ approach central venous catheter in place. Soft tissue swelling with stranding and scattered foci of soft tissue emphysema within the right neck likely related to central line placement. Limited intracranial: Unremarkable Visualized orbits: Partially visualized inferior globes and orbits unremarkable. Mastoids and visualized paranasal sinuses: Mucosal thickening noted within the visualized maxillary and sphenoid sinuses. Visualized paranasal sinuses are otherwise clear. Visualize mastoids  and middle ear cavities are clear. Skeleton: No acute osseous abnormality. No discrete lytic or blastic osseous lesions. Mild cervical spondylolysis present at C5-6. Other: None. CT CHEST FINDINGS Cardiovascular: Normal intravascular enhancement seen throughout the intra-abdominal aorta without aneurysm or other acute abnormality. Minimal atheromatous plaque within the aortic arch. Visualized great vessels within normal limits. Heart size normal. Small moderate pericardial effusion, simple fluid density. Mediastinum/Nodes: Enlarged nodes within the upper mediastinum measure up to 15 mm in short axis (series 3, image 11). No appreciable  supraclavicular adenopathy. No other pathologically enlarged mediastinal lymph nodes. Mild soft tissue density within the right hilum without frank adenopathy. Mildly enlarged 11 mm left hilar node (series 3, image 28). Enlarged axillary adenopathy seen bilaterally, with the largest node on the left measuring 2.1 cm in short axis (series 3, image 16). Largest node on the right measures 1.7 cm in short axis (series 3, image 8). Scattered soft tissue stranding with emphysema and fluid density overlying the left axilla likely related to recent lymph node biopsy, partially visualized. Esophagus within normal limits. Lungs/Pleura: Tracheobronchial tree intact and patent. Moderate layering bilateral pleural effusions with associated atelectasis. No other airspace consolidation. No pulmonary edema. No pneumothorax. 4 mm subpleural ground-glass nodule present at the anterior right upper lobe (series 4, image 57), indeterminate. No other worrisome pulmonary nodule or mass. Musculoskeletal: No acute osseous finding. No discrete lytic or blastic osseous lesions. CT ABDOMEN PELVIS FINDINGS Hepatobiliary: Liver demonstrates a normal contrast enhanced appearance. Gallbladder within normal limits. No biliary dilatation. Pancreas: Pancreas within normal limits. Spleen: Spleen enlarged measuring 14.3  cm in craniocaudad dimension. Few subtle hypodensities noted within the spleen, largest of which measures approximately 12 mm (series 3, image 57), indeterminate. Adrenals/Urinary Tract: Adrenal glands are normal. Kidneys equal in size with symmetric enhancement. No nephrolithiasis, hydronephrosis, or focal enhancing renal mass. No hydroureter. Bladder within normal limits. Stomach/Bowel: Stomach within normal limits. No evidence for bowel obstruction. Normal appendix. No acute inflammatory changes seen about the bowels. Vascular/Lymphatic: Normal intravascular enhancement seen throughout the intra-abdominal aorta. Mild aorto bi-iliac atherosclerotic disease. No aneurysm. Mesenteric vessels patent proximally. Enlarged 15 mm aortocaval lymph node (series 3, image 7). Additional multifocal shotty subcentimeter aortocaval and periaortic lymph nodes noted. No other pathologically enlarged intra-abdominal lymph nodes identified. Mildly prominent 12 mm left inguinal lymph nodes noted. No other adenopathy within the pelvis. Reproductive: Uterus within normal limits.  Ovaries normal. Other: Moderate volume free fluid within the pelvis, measuring simple fluid density. No free intraperitoneal air. Musculoskeletal: Scattered anasarca noted within the external soft tissues. No acute osseous finding. No discrete lytic or blastic osseous lesions. IMPRESSION: CT NECK IMPRESSION 1. Increased number of shoddy subcentimeter lymph nodes throughout the neck bilaterally, left slightly worse than right. No pathologically enlarged lymph nodes identified. 2. Mild diffuse thyromegaly without discrete thyroid nodule or mass. 3. Mild soft tissue stranding with emphysema within the right lateral neck related to recent right-sided Port-A-Cath placement. CT CHEST IMPRESSION 1. Enlarged bilateral axillary and upper mediastinal adenopathy as above, likely related to provided history of lymphoma. Additional borderline enlarged left hilar node may  be related to lymphoma or possibly reactive in nature. Attention at follow-up recommended. 2. Moderate layering bilateral pleural effusions with associated atelectasis. 3. Small to moderate pericardial effusion. 4. 4 mm right upper lobe ground-glass nodule, indeterminate. Attention at follow-up recommended. CT ABDOMEN AND PELVIS IMPRESSION 1. Enlarged 15 mm aortocaval lymph node with mildly enlarged 12 mm left inguinal lymph nodes. Additional increased number of shotty subcentimeter retroperitoneal lymph nodes. Findings most likely related to history of lymphoma. 2. Splenomegaly. 3. Moderate volume free fluid within the pelvis, of uncertain etiology, but could be physiologic and/or related overall volume status. 4. Mild diffuse anasarca. Electronically Signed   By: Jeannine Boga M.D.   On: 11/18/2018 19:56   Ct Biopsy  Result Date: 11/15/2018 INDICATION: Pancytopenia, concern for lymphoproliferative process EXAM: CT GUIDED RIGHT ILIAC BONE MARROW ASPIRATION AND CORE BIOPSY Date:  11/15/2018 11/15/2018 10:13 am Radiologist:  M. Daryll Brod, MD Guidance:  CT FLUOROSCOPY TIME:  Fluoroscopy Time: None. MEDICATIONS: 1% lidocaine local ANESTHESIA/SEDATION: 2.0 mg IV Versed; 50 mcg IV Fentanyl Moderate Sedation Time:  10 minutes The patient was continuously monitored during the procedure by the interventional radiology nurse under my direct supervision. CONTRAST:  None. COMPLICATIONS: None PROCEDURE: Informed consent was obtained from the patient following explanation of the procedure, risks, benefits and alternatives. The patient understands, agrees and consents for the procedure. All questions were addressed. A time out was performed. The patient was positioned prone and non-contrast localization CT was performed of the pelvis to demonstrate the iliac marrow spaces. Maximal barrier sterile technique utilized including caps, mask, sterile gowns, sterile gloves, large sterile drape, hand hygiene, and Betadine  prep. Under sterile conditions and local anesthesia, an 11 gauge coaxial bone biopsy needle was advanced into the right iliac marrow space. Needle position was confirmed with CT imaging. Initially, bone marrow aspiration was performed. Next, the 11 gauge outer cannula was utilized to obtain a right iliac bone marrow core biopsy. Needle was removed. Hemostasis was obtained with compression. The patient tolerated the procedure well. Samples were prepared with the cytotechnologist. No immediate complications. IMPRESSION: CT guided right iliac bone marrow aspiration and core biopsy. Electronically Signed   By: Jerilynn Mages.  Shick M.D.   On: 11/15/2018 11:25   Dg Chest Port 1 View  Result Date: 11/25/2018 CLINICAL DATA:  Dyspnea. EXAM: PORTABLE CHEST 1 VIEW COMPARISON:  November 21, 2018 FINDINGS: The right Port-A-Cath is stable. Small effusion and left basilar opacity remain. Mild pulmonary venous congestion without overt edema. The cardiomediastinal silhouette is stable with cardiomegaly. IMPRESSION: Mild pulmonary venous congestion. Tiny left effusion with underlying opacity in left base, stable. No other change. Electronically Signed   By: Dorise Bullion III M.D   On: 11/25/2018 13:02   Dg Chest Port 1 View  Result Date: 11/11/2018 CLINICAL DATA:  Fever EXAM: PORTABLE CHEST 1 VIEW COMPARISON:  None. FINDINGS: Mild cardiomegaly. No focal consolidation or effusion. No pneumothorax. IMPRESSION: No active disease. Electronically Signed   By: Donavan Foil M.D.   On: 11/11/2018 21:59   Ct Bone Marrow Biopsy & Aspiration  Result Date: 11/15/2018 INDICATION: Pancytopenia, concern for lymphoproliferative process EXAM: CT GUIDED RIGHT ILIAC BONE MARROW ASPIRATION AND CORE BIOPSY Date:  11/15/2018 11/15/2018 10:13 am Radiologist:  M. Daryll Brod, MD Guidance:  CT FLUOROSCOPY TIME:  Fluoroscopy Time: None. MEDICATIONS: 1% lidocaine local ANESTHESIA/SEDATION: 2.0 mg IV Versed; 50 mcg IV Fentanyl Moderate Sedation Time:  10  minutes The patient was continuously monitored during the procedure by the interventional radiology nurse under my direct supervision. CONTRAST:  None. COMPLICATIONS: None PROCEDURE: Informed consent was obtained from the patient following explanation of the procedure, risks, benefits and alternatives. The patient understands, agrees and consents for the procedure. All questions were addressed. A time out was performed. The patient was positioned prone and non-contrast localization CT was performed of the pelvis to demonstrate the iliac marrow spaces. Maximal barrier sterile technique utilized including caps, mask, sterile gowns, sterile gloves, large sterile drape, hand hygiene, and Betadine prep. Under sterile conditions and local anesthesia, an 11 gauge coaxial bone biopsy needle was advanced into the right iliac marrow space. Needle position was confirmed with CT imaging. Initially, bone marrow aspiration was performed. Next, the 11 gauge outer cannula was utilized to obtain a right iliac bone marrow core biopsy. Needle was removed. Hemostasis was obtained with compression. The patient tolerated the procedure well. Samples were prepared with the cytotechnologist. No immediate complications.  IMPRESSION: CT guided right iliac bone marrow aspiration and core biopsy. Electronically Signed   By: Jerilynn Mages.  Shick M.D.   On: 11/15/2018 11:25   Ir Imaging Guided Port Insertion  Result Date: 11/18/2018 INDICATION: 53 year old female with history lymphoma EXAM: IMPLANTED PORT A CATH PLACEMENT WITH ULTRASOUND AND FLUOROSCOPIC GUIDANCE MEDICATIONS: 2.0 g Ancef; The antibiotic was administered within an appropriate time interval prior to skin puncture. ANESTHESIA/SEDATION: Moderate (conscious) sedation was employed during this procedure. A total of Versed 2.0 mg and Fentanyl 100 mcg was administered intravenously. Moderate Sedation Time: 18 minutes. The patient's level of consciousness and vital signs were monitored  continuously by radiology nursing throughout the procedure under my direct supervision. FLUOROSCOPY TIME:  0 minutes, 12 seconds (1.0 mGy) COMPLICATIONS: None PROCEDURE: The procedure, risks, benefits, and alternatives were explained to the patient. Questions regarding the procedure were encouraged and answered. The patient understands and consents to the procedure. Ultrasound survey was performed with images stored and sent to PACs. The right neck and chest was prepped with chlorhexidine, and draped in the usual sterile fashion using maximum barrier technique (cap and mask, sterile gown, sterile gloves, large sterile sheet, hand hygiene and cutaneous antiseptic). Antibiotic prophylaxis was provided with 2.0g Ancef administered IV one hour prior to skin incision. Local anesthesia was attained by infiltration with 1% lidocaine without epinephrine. Ultrasound demonstrated patency of the right internal jugular vein, and this was documented with an image. Under real-time ultrasound guidance, this vein was accessed with a 21 gauge micropuncture needle and image documentation was performed. A small dermatotomy was made at the access site with an 11 scalpel. A 0.018" wire was advanced into the SVC and used to estimate the length of the internal catheter. The access needle exchanged for a 63F micropuncture vascular sheath. The 0.018" wire was then removed and a 0.035" wire advanced into the IVC. An appropriate location for the subcutaneous reservoir was selected below the clavicle and an incision was made through the skin and underlying soft tissues. The subcutaneous tissues were then dissected using a combination of blunt and sharp surgical technique and a pocket was formed. A single lumen power injectable portacatheter was then tunneled through the subcutaneous tissues from the pocket to the dermatotomy and the port reservoir placed within the subcutaneous pocket. The venous access site was then serially dilated and a peel  away vascular sheath placed over the wire. The wire was removed and the port catheter advanced into position under fluoroscopic guidance. The catheter tip is positioned in the cavoatrial junction. This was documented with a spot image. The portacatheter was then tested and found to flush and aspirate well. The port was flushed with saline followed by 100 units/mL heparinized saline. The pocket was then closed in two layers using first subdermal inverted interrupted absorbable sutures followed by a running subcuticular suture. The epidermis was then sealed with Dermabond. The dermatotomy at the venous access site was also seal with Dermabond. Patient tolerated the procedure well and remained hemodynamically stable throughout. No complications encountered and no significant blood loss encountered IMPRESSION: Status post right IJ port catheter placement. Catheter ready for use. Signed, Dulcy Fanny. Dellia Nims, RPVI [facility] Electronically Signed   By: Corrie Mckusick D.O.   On: 11/18/2018 11:23   US Abdomen Limited Ruq  Result Date: 11/29/2018 CLINICAL DATA:  Elevated total bilirubin = 5.2 EXAM: ULTRASOUND ABDOMEN LIMITED RIGHT UPPER QUADRANT COMPARISON:  CT abdomen and pelvis 11/27/2018 FINDINGS: Gallbladder: Normally distended without  stones or wall thickening. No pericholecystic fluid or sonographic Murphy sign. Common bile duct: Diameter: 3 mm diameter, normal Liver: Normal hepatic echogenicity. No mass or nodularity. No intrahepatic biliary dilatation. Portal vein is patent on color Doppler imaging with normal direction of blood flow towards the liver. No RIGHT upper quadrant free fluid. IMPRESSION: Normal exam. Electronically Signed   By: Lavonia Dana M.D.   On: 11/29/2018 14:34     ASSESSMENT/PLAN:  Stage IV Hodgkin lymphoma -Port placed in 10/2018; TTE showed normal LVEF -The patient received day 1 of cycle 1 ofAVD + Adcetris on 11/19/2018. On  Granix since 11/20/2018. She remains neutropenic, but improving. Continue Granix until ANC > 1.0. -Recommend PRN anti-emetics, including Zofran and Compazine -CT scan of the abdomen pelvis showed the intra-abdominal lymph nodes appear to be improving.  Spleen is decreased in size. -Next dose of chemo due 12/03/2018 but we will delay the treatment until clinical improvement. Date TBD.  Normocytic anemia -Likely secondary to anemia of chronic disease and underlying lymphoma -Hemoglobinis  8.8.    Status post 1 unit packed red blood cells on 12/02/2018. -Supportive transfusion to keep Hgb > 7. Blood products to be irradiated.   Thrombocytopenia -Likely secondary to underlying lymphomaand recent chemotherapy. -Platelets 107,000. No transfusion indicated today.  -Daily CBC -Goal plts >10k in the absence of bleeding. Blood products to be irradiated.    Neutropenic fever - Resolved -Secondary to underlying lymphoma, bacteremia and C. diff colitis  -Patient had + blood cultures (E Coli and Group B Strep). Repeat cultures negative to date.  -On Ceftriaxone.  To complete 10 days worth of ceftriaxone per ID. -Fungal culture negative. -Appreciate assistance from infectious disease.  Severe protein malnutrition -Patient has lost > 20 lbs since the onset of her symptoms, and her most recent albumin was 2.0 with anasarca, consistent with severe protein malnutrition -Dietitian is following. -If her nutrition remains very poor and she is unable to increase her PO intake, we will have to consider enteral nutrition, such as PEG tube, to help improve her nutritional intake  Diarrhea with abdominal discomfort - Resolved -Stool for C. diff positive.  -Continue oral vancomycin for 14 days per ID. -CT the abdomen pelvis did not show any concerning findings in the left lower quadrant of her abdomen. -Abdominal pain has resolved.  Stools are now formed.   -Continue pain meds as needed.  Electrolyte  abnormalities -Electrolyte abnormalities  overall improved. Replete per primary team.  Hyperbilirubinemia -Unclear etiology.  CT scan and abdominal U/S did not show any focal liver abnormality or obstruction. Possibly reactive.  -T Bili down to 4.2 this morning.  -GI following, appreciate their assistance   Urinary retention -Foley catheter has been discontinued. -Nursing is checking a bladder scan periodically.  Deconditioning -Working with PT. Up to chair this am. -Will likely need SNF upon D/C.  Patient's daughter, Anderson [place], [phone], requests periodic updates.  Updated on 12/02/2018.  Will update again tomorrow.    LOS: 22 days   Mikey Bussing, DNP, AGPCNP-BC, AOCNP 12/03/18

## 2018-12-03 NOTE — Progress Notes (Signed)
PROGRESS NOTE    Cindy Ward  MVH:[id number] DOB: 1965-11-23 DOA: 11/11/2018 PCP: [name], MD   Brief Narrative:  53 year old female with history of essential hypertension, hypothyroidism initially presented to [facility] with complaints of recurrent nausea, vomiting, fever and night sweats.  Upon extensive work-up she was diagnosed with Hodgkin's lymphoma, stage IV.  Oncology team was consulted and patient was transferred here for further care and management.  Lymph node biopsy was consistent with classic Hodgkin's lymphoma.  Hospital course has been complicated by bacteremia, intermittent encephalopathy, severe protein calorie malnutrition and electrolyte imbalance.  She is also been pancytopenic since she is on chemotherapy receiving Granix.  Pulmonary and oncology team has been following.   Assessment & Plan:   Principal Problem:   Hodgkin lymphoma ([place]) Active Problems:   Lymphadenopathy   Fever   Splenomegaly   Liver enzyme elevation   Thrombocytopenia (HCC)   Unintentional weight loss   Hashimoto's thyroiditis   Goiter   Hypertension   Aortic atherosclerosis (HCC)   Pancytopenia (HCC)   Malnutrition of moderate degree   Normocytic anemia   Neutropenic fever (HCC)   Oral thrush   Enteritis due to Clostridium difficile   Streptococcal bacteremia   Palliative care by specialist   Goals of care, counseling/discussion   Total bilirubin, elevated  Stage IV Hodgkin's lymphoma, new diagnosis - CT of the chest abdomen pelvis consistent with diffuse lymphadenopathy.  CT of the head is negative for any metastatic disease.  Lymph node biopsy performed 2/18-consistent with Hodgkin's lymphoma.  Port-A-Cath placed 2/24.  Chemotherapy started 2/25.  Plans for second round of chemotherapy outpatient when patient is much stronger and total bilirubin is at least below 2.0 per Dr Maylon Peppers.  -Appreciate Palliative care input.   Hypokalemia -Aggressive repletion  ordered  Pancytopenia with mild rectal bleeding overnight.  Anemia of chronic disease, Hb 8.8 - Platelets have improved to 107, WBC improving slowly as well.  Hemoglobin improved to 8.8 after transfusion.  Try and keep.  Above 8.0 prior to discharge per oncology.  Abnormal breath sounds - Bronchodilators.  Incentive spirometry and flutter valve  Sinus tachycardia; greatly improved - with hydration and beta-blockers.  Bacteremia with E. coli and group B strep with neutropenia C. difficile colitis, slowly improving - ID recommends continue Rocephin-day 8/10 .  Oral vancomycin day 6/14.  -Continue to follow blood cultures.  Repeat Cx 3/5= NGTD. Fluconazole stopped -Fungal Cx- NGTD and strep agalactiae data - C. difficile antigen positive, toxin negative.    Elevated Total Bilirubin; possible due to hepatotoxic meds -CT abdomen pelvis is negative for any acute pathology in the right upper quadrant area -Right upper quadrant ultrasound- negative. -Appreciate GI input.  T bili trending down.  Acute metabolic encephalopathy/delirium; improved.  Visual hallucination; resolved.  -Resolved  Severe protein calorie malnutrition -Encourage oral diet as tolerated.  Acute urinary retention with bilateral hydroureteronephrosis -Foley catheter removed.  Still having off-and-on urinary retention requiring intermittent cath.  Avoid placing long-term catheter due to her neutropenic state  Severe protein calorie malnutrition Mild dysphagia with oral thrush - Encourage p.o. intake.  Nutrition consultation. - Nystatin and Diflucan given.  Hypothyroidism -Continue Synthroid  Generalized debility - PT following- will eventually need skilled nursing facility  DVT prophylaxis: SCDs Code Status: Full code Family Communication: None at bedside Disposition Plan: Maintain hospital stay for at least next 24-48 hours until her electrolytes are stable.  Currently social worker working on disposition  given her lack of payer source  Consultants:   Oncology   PCCM  Procedures:   Bx 2/21  Port cath 2/24  Antimicrobials:  Anti-infectives (From admission, onward)   Start     Dose/Rate Route Frequency Ordered Stop   11/27/18 1130  vancomycin (VANCOCIN) 50 mg/mL oral solution 125 mg     125 mg Oral 4 times daily 11/27/18 1051 12/10/18 2359   11/27/18 0915  metroNIDAZOLE (FLAGYL) IVPB 500 mg  Status:  Discontinued     500 mg 100 mL/hr over 60 Minutes Intravenous Every 8 hours 11/27/18 0912 11/27/18 1051   11/26/18 0200  cefTRIAXone (ROCEPHIN) 2 g in sodium chloride 0.9 % 100 mL IVPB     2 g 200 mL/hr over 30 Minutes Intravenous Daily at bedtime 11/26/18 0059 12/05/18 2159   11/25/18 1200  fluconazole (DIFLUCAN) IVPB 200 mg  Status:  Discontinued     200 mg 100 mL/hr over 60 Minutes Intravenous Every 24 hours 11/25/18 0836 12/02/18 1255   11/25/18 1000  vancomycin (VANCOCIN) IVPB 1000 mg/200 mL premix  Status:  Discontinued     1,000 mg 200 mL/hr over 60 Minutes Intravenous Every 12 hours 11/25/18 0848 11/26/18 0827   11/25/18 1000  ceFEPIme (MAXIPIME) 2 g in sodium chloride 0.9 % 100 mL IVPB  Status:  Discontinued     2 g 200 mL/hr over 30 Minutes Intravenous Every 8 hours 11/25/18 0848 11/26/18 0058   11/25/18 0900  vancomycin (VANCOCIN) 1,500 mg in sodium chloride 0.9 % 500 mL IVPB     1,500 mg 250 mL/hr over 120 Minutes Intravenous  Once 11/25/18 0848 11/25/18 1135   11/24/18 1500  fluconazole (DIFLUCAN) tablet 200 mg  Status:  Discontinued     200 mg Oral Daily 11/24/18 1450 11/25/18 0836   11/18/18 0600  ceFAZolin (ANCEF) IVPB 2g/100 mL premix     2 g 200 mL/hr over 30 Minutes Intravenous To Radiology 11/17/18 1243 11/18/18 1130   11/12/18 0800  cefTRIAXone (ROCEPHIN) 2 g in sodium chloride 0.9 % 100 mL IVPB  Status:  Discontinued     2 g 200 mL/hr over 30 Minutes Intravenous Every 24 hours 11/11/18 1711 11/12/18 1057         Subjective: Overall still quite weak  but feels okay.  Overall diarrhea is improving  Review of Systems Otherwise negative except as per HPI, including: General = no fevers, chills, dizziness, malaise, fatigue HEENT/EYES = negative for pain, redness, loss of vision, double vision, blurred vision, loss of hearing, sore throat, hoarseness, dysphagia Cardiovascular= negative for chest pain, palpitation, murmurs, lower extremity swelling Respiratory/lungs= negative for shortness of breath, cough, hemoptysis, wheezing, mucus production Gastrointestinal= negative for nausea, vomiting,, abdominal pain, melena, hematemesis Genitourinary= negative for Dysuria, Hematuria, Change in Urinary Frequency MSK = Negative for arthralgia, myalgias, Back Pain, Joint swelling  Neurology= Negative for headache, seizures, numbness, tingling  Psychiatry= Negative for anxiety, depression, suicidal and homocidal ideation Allergy/Immunology= Medication/Food allergy as listed  Skin= Negative for Rash, lesions, ulcers, itching    Objective: Vitals:   12/02/18 1320 12/02/18 1359 12/02/18 2024 12/03/18 0500  BP: 125/68 126/82 117/72 119/77  Pulse: 86 88 87 87  Resp: 15 20 16 16   Temp: 98.4 F (36.9 C)  97.6 F (36.4 C) 97.7 F (36.5 C)  TempSrc: Oral  Oral Oral  SpO2: 97% 96% 96% 96%  Weight:      Height:        Intake/Output Summary (Last 24 hours) at 12/03/2018 1118 Last data filed at 12/03/2018 1021  Gross per 24 hour  Intake 1055 ml  Output 721 ml  Net 334 ml   Filed Weights   11/30/18 0403 12/01/18 0343 12/02/18 0513  Weight: 67.6 kg 65.8 kg 64.9 kg    Examination: Constitutional: NAD, calm, comfortable Eyes: PERRL, lids and conjunctivae normal ENMT: Mucous membranes are moist. Posterior pharynx clear of any exudate or lesions.Normal dentition.  Neck: normal, supple, no masses, no thyromegaly Respiratory: Mild bilateral coarse breath sounds Cardiovascular: Regular rate and rhythm, no murmurs / rubs / gallops. No extremity edema.  2+ pedal pulses. No carotid bruits.  Abdomen: no tenderness, no masses palpated. No hepatosplenomegaly. Bowel sounds positive.  Musculoskeletal: no clubbing / cyanosis. No joint deformity upper and lower extremities. Good ROM, no contractures. Normal muscle tone.  Skin: no rashes, lesions, ulcers. No induration Neurologic: CN 2-12 grossly intact. Sensation intact, DTR normal. Strength 4/5 in all 4.  Psychiatric: Normal judgment and insight. Alert and oriented x 3. Normal mood.   Port-A-Cath in place without any evidence of infection bleeding or erythema  Data Reviewed:   CBC: Recent Labs  Lab 11/29/18 0500 11/30/18 0345 11/30/18 2356 12/01/18 0404 12/02/18 0324 12/03/18 0343  WBC 0.4* 0.4*  --  0.6* 0.9* 2.0*  NEUTROABS  --   --   --  0.2* 0.4* 0.6*  HGB 8.0* 7.2* 7.8* 8.1* 7.3* 8.8*  HCT 25.8* 23.3* 25.4* 26.0* 24.1* 27.5*  MCV 89.6 90.3  --  90.3 90.9 89.3  PLT 38* 38*  --  61* 91* 595*   Basic Metabolic Panel: Recent Labs  Lab 11/28/18 0440  11/29/18 0500 11/30/18 0345 12/01/18 0404 12/02/18 0324 12/03/18 0343  NA 144   < > 143 141 143 147* 147*  K 3.3*   < > 3.5 3.3* 3.5 3.4* 2.9*  CL 110   < > 109 112* 113* 116* 111  CO2 25   < > 25 22 23 25 26   GLUCOSE 141*   < > 159* 120* 98 95 94  BUN 7   < > 9 8 8 10 11   CREATININE 0.58   < > 0.61 0.49 0.47 0.50 0.57  CALCIUM 4.5*   < > 5.6* 6.4* 7.5* 8.0* 8.6*  MG 2.5*  --  2.2 2.2 1.9 1.8 1.7  PHOS 2.0*  --  1.7* 2.9 2.9 3.2  --    < > = values in this interval not displayed.   GFR: Estimated Creatinine Clearance: 82 mL/min (by C-G formula based on SCr of 0.57 mg/dL). Liver Function Tests: Recent Labs  Lab 11/29/18 0500 11/30/18 0345 12/01/18 0404 12/02/18 0324 12/03/18 0343  AST 33 39 50* 48* 41  ALT 59* 56* 64* 65* 66*  ALKPHOS 106 91 97 99 132*  BILITOT 5.2* 5.6* 6.7* 5.9* 4.2*  PROT 5.1* 4.6* 4.7* 4.6* 4.8*  ALBUMIN 2.0* 1.7* 1.6* 1.6* 1.8*   No results for input(s): LIPASE, AMYLASE in the last 168  hours. No results for input(s): AMMONIA in the last 168 hours. Coagulation Profile: No results for input(s): INR, PROTIME in the last 168 hours. Cardiac Enzymes: No results for input(s): CKTOTAL, CKMB, CKMBINDEX, TROPONINI in the last 168 hours. BNP (last 3 results) No results for input(s): PROBNP in the last 8760 hours. HbA1C: No results for input(s): HGBA1C in the last 72 hours. CBG: No results for input(s): GLUCAP in the last 168 hours. Lipid Profile: No results for input(s): CHOL, HDL, LDLCALC, TRIG, CHOLHDL, LDLDIRECT in the last 72 hours. Thyroid Function Tests: No results  for input(s): TSH, T4TOTAL, FREET4, T3FREE, THYROIDAB in the last 72 hours. Anemia Panel: No results for input(s): VITAMINB12, FOLATE, FERRITIN, TIBC, IRON, RETICCTPCT in the last 72 hours. Sepsis Labs: No results for input(s): PROCALCITON, LATICACIDVEN in the last 168 hours.  Recent Results (from the past 240 hour(s))  Culture, blood (routine x 2)     Status: Abnormal   Collection Time: 11/25/18  9:08 AM  Result Value Ref Range Status   Specimen Description   Final    BLOOD RIGHT HAND Performed at [facility] [address] [place], [place] [address]    Special Requests   Final    BAA BCAV Performed at [facility] [address], [place], [place] [address]    Culture  Setup Time   Final    GRAM POSITIVE COCCI GRAM NEGATIVE RODS IN BOTH AEROBIC AND ANAEROBIC BOTTLES CRITICAL VALUE NOTED.  VALUE IS CONSISTENT WITH PREVIOUSLY REPORTED AND CALLED VALUE.    Culture (A)  Final    ESCHERICHIA COLI GROUP B STREP(S.AGALACTIAE)ISOLATED SUSCEPTIBILITIES PERFORMED ON PREVIOUS CULTURE WITHIN THE LAST 5 DAYS. Performed at [facility], [place] [address] [place], [place] [address]    Report Status 11/28/2018 FINAL  Final  Culture, blood (routine x 2)     Status: Abnormal   Collection Time: 11/25/18  9:08 AM  Result Value Ref Range Status   Specimen Description    Final    BLOOD LEFT ARM Performed at [facility] [address], [place], [place] [address]    Special Requests   Final    BAA BCAV Performed at [facility] [address], [place], [place] [address]    Culture  Setup Time   Final    GRAM POSITIVE COCCI GRAM NEGATIVE RODS IN BOTH AEROBIC AND ANAEROBIC BOTTLES CRITICAL RESULT CALLED TO, READ BACK BY AND VERIFIED WITH: E GREEN PHARMD 11/26/18 0025 JDW Performed at [facility], [place] [address] [place], [place] [address]    Culture (A)  Final    ESCHERICHIA COLI GROUP B STREP(S.AGALACTIAE)ISOLATED    Report Status 11/28/2018 FINAL  Final   Organism ID, Bacteria ESCHERICHIA COLI  Final   Organism ID, Bacteria GROUP B STREP(S.AGALACTIAE)ISOLATED  Final      Susceptibility   Escherichia coli - MIC*    AMPICILLIN >=32 RESISTANT Resistant     CEFAZOLIN <=4 SENSITIVE Sensitive     CEFEPIME <=1 SENSITIVE Sensitive     CEFTAZIDIME <=1 SENSITIVE Sensitive     CEFTRIAXONE <=1 SENSITIVE Sensitive     CIPROFLOXACIN >=4 RESISTANT Resistant     GENTAMICIN <=1 SENSITIVE Sensitive     IMIPENEM <=0.25 SENSITIVE Sensitive     TRIMETH/SULFA <=20 SENSITIVE Sensitive     AMPICILLIN/SULBACTAM >=32 RESISTANT Resistant     PIP/TAZO <=4 SENSITIVE Sensitive     Extended ESBL NEGATIVE Sensitive     * ESCHERICHIA COLI   Group b strep(s.agalactiae)isolated - MIC*    CLINDAMYCIN >=1 RESISTANT Resistant     AMPICILLIN <=0.25 SENSITIVE Sensitive     ERYTHROMYCIN >=8 RESISTANT Resistant     VANCOMYCIN 0.5 SENSITIVE Sensitive     CEFTRIAXONE <=0.12 SENSITIVE Sensitive     LEVOFLOXACIN 1 SENSITIVE Sensitive     PENICILLIN SENSITIVE Sensitive     * GROUP B STREP(S.AGALACTIAE)ISOLATED  Blood Culture ID Panel (Reflexed)     Status: Abnormal   Collection Time: 11/25/18  9:08 AM  Result Value Ref Range Status  Enterococcus species NOT DETECTED NOT DETECTED Final   Listeria monocytogenes NOT DETECTED NOT DETECTED  Final   Staphylococcus species NOT DETECTED NOT DETECTED Final   Staphylococcus aureus (BCID) NOT DETECTED NOT DETECTED Final   Streptococcus species DETECTED (A) NOT DETECTED Final    Comment: CRITICAL RESULT CALLED TO, READ BACK BY AND VERIFIED WITH: E GREEN PHARMD 11/26/18 0025 JDW    Streptococcus agalactiae DETECTED (A) NOT DETECTED Final    Comment: CRITICAL RESULT CALLED TO, READ BACK BY AND VERIFIED WITH: E GREEN PHARMD 11/26/18 0025 JDW    Streptococcus pneumoniae NOT DETECTED NOT DETECTED Final   Streptococcus pyogenes NOT DETECTED NOT DETECTED Final   Acinetobacter baumannii NOT DETECTED NOT DETECTED Final   Enterobacteriaceae species DETECTED (A) NOT DETECTED Final    Comment: Enterobacteriaceae represent a large family of gram-negative bacteria, not a single organism. CRITICAL RESULT CALLED TO, READ BACK BY AND VERIFIED WITH: E GREEN PHARMD 11/26/18 0025 JDW    Enterobacter cloacae complex NOT DETECTED NOT DETECTED Final   Escherichia coli DETECTED (A) NOT DETECTED Final    Comment: CRITICAL RESULT CALLED TO, READ BACK BY AND VERIFIED WITH: E GREEN PHARMD 11/26/18 0025 JDW    Klebsiella oxytoca NOT DETECTED NOT DETECTED Final   Klebsiella pneumoniae NOT DETECTED NOT DETECTED Final   Proteus species NOT DETECTED NOT DETECTED Final   Serratia marcescens NOT DETECTED NOT DETECTED Final   Carbapenem resistance NOT DETECTED NOT DETECTED Final   Haemophilus influenzae NOT DETECTED NOT DETECTED Final   Neisseria meningitidis NOT DETECTED NOT DETECTED Final   Pseudomonas aeruginosa NOT DETECTED NOT DETECTED Final   Candida albicans NOT DETECTED NOT DETECTED Final   Candida glabrata NOT DETECTED NOT DETECTED Final   Candida krusei NOT DETECTED NOT DETECTED Final   Candida parapsilosis NOT DETECTED NOT DETECTED Final   Candida tropicalis NOT DETECTED NOT DETECTED Final    Comment: Performed at [facility], [place] [address], [place], [place] [address]  Fungus culture, blood      Status: None   Collection Time: 11/25/18  1:26 PM  Result Value Ref Range Status   Specimen Description   Final    BLOOD Performed at [facility], [place] [address], [place], [place] [address]    Special Requests   Final    NONE Performed at [facility], [place] [address], [place], [place] [address]    Culture   Final    NO GROWTH 7 DAYS NO FUNGUS ISOLATED Performed at [facility], [place] [address], [place], [place] [address]    Report Status 12/02/2018 FINAL  Final  C difficile quick scan w PCR reflex     Status: Abnormal   Collection Time: 11/27/18  8:00 AM  Result Value Ref Range Status   C Diff antigen POSITIVE (A) NEGATIVE Final   C Diff toxin NEGATIVE NEGATIVE Final   C Diff interpretation Results are indeterminate. See PCR results.  Final    Comment: Performed at [facility], [place] [address] [place], [place] [address]  C. Diff by PCR, Reflexed     Status: Abnormal   Collection Time: 11/27/18  8:00 AM  Result Value Ref Range Status   Toxigenic C. Difficile by PCR POSITIVE (A) NEGATIVE Final    Comment: Positive for toxigenic C. difficile with little to no toxin production. Only treat if clinical presentation suggests symptomatic illness. Performed at [facility], [place] [address] [place],  [address]  Culture, blood (Routine X 2) w Reflex to ID Panel     Status: None   Collection Time: 11/28/18 12:19 AM  Result Value Ref Range Status   Specimen Description   Final    BLOOD RIGHT HAND Performed at [facility] [address], [place], [place] [address]    Special Requests   Final    BOTTLES DRAWN AEROBIC AND ANAEROBIC Blood Culture adequate volume Performed at [facility] [address], [place], [place] [address]    Culture   Final    NO GROWTH 5 DAYS Performed at [facility], [place] [address] [place], [place] [address]    Report Status 12/03/2018 FINAL  Final   Culture, blood (Routine X 2) w Reflex to ID Panel     Status: None   Collection Time: 11/28/18 12:19 AM  Result Value Ref Range Status   Specimen Description   Final    BLOOD LEFT HAND Performed at [facility] [address] [place], [place] [address]    Special Requests   Final    BOTTLES DRAWN AEROBIC AND ANAEROBIC Blood Culture adequate volume Performed at [facility] [address], [place], [place] [address]    Culture   Final    NO GROWTH 5 DAYS Performed at [facility], [place] [address], [place], [place] [address]    Report Status 12/03/2018 FINAL  Final         Radiology Studies: No results found.      Scheduled Meds: . Chlorhexidine Gluconate Cloth  6 each Topical Daily  . levothyroxine  37.5 mcg Intravenous Daily  . mouth rinse  15 mL Mouth Rinse BID  . multivitamin with minerals  1 tablet Oral Daily  . nebivolol  10 mg Oral Daily  . nystatin  5 mL Oral QID  . potassium chloride  40 mEq Oral Q6H  . scopolamine  1 patch Transdermal Q72H  . Tbo-filgastrim (GRANIX) SQ  480 mcg Subcutaneous q1800  . vancomycin  125 mg Oral QID   Continuous Infusions: . sodium chloride 1,000 mL (11/30/18 1420)  . sodium chloride    . cefTRIAXone (ROCEPHIN)  IV Stopped (12/02/18 2253)  . potassium chloride 10 mEq (12/03/18 1016)     LOS: 22 days   Time spent= 35 mins    Daziya Redmond Arsenio Loader, MD [facility]  If 7PM-7AM, please contact night-coverage [email or web address] 12/03/2018, 11:18 AM

## 2018-12-04 ENCOUNTER — Inpatient Hospital Stay (HOSPITAL_COMMUNITY): Payer: Medicaid Other

## 2018-12-04 ENCOUNTER — Other Ambulatory Visit: Payer: Self-pay

## 2018-12-04 LAB — CBC WITH DIFFERENTIAL/PLATELET
Abs Immature Granulocytes: 0.53 10*3/uL — ABNORMAL HIGH (ref 0.00–0.07)
Basophils Absolute: 0 10*3/uL (ref 0.0–0.1)
Basophils Relative: 1 %
EOS ABS: 0 10*3/uL (ref 0.0–0.5)
Eosinophils Relative: 0 %
HCT: 27.5 % — ABNORMAL LOW (ref 36.0–46.0)
Hemoglobin: 8.5 g/dL — ABNORMAL LOW (ref 12.0–15.0)
Immature Granulocytes: 13 %
Lymphocytes Relative: 13 %
Lymphs Abs: 0.5 10*3/uL — ABNORMAL LOW (ref 0.7–4.0)
MCH: 28.1 pg (ref 26.0–34.0)
MCHC: 30.9 g/dL (ref 30.0–36.0)
MCV: 91.1 fL (ref 80.0–100.0)
MONO ABS: 0.6 10*3/uL (ref 0.1–1.0)
Monocytes Relative: 16 %
Neutro Abs: 2.4 10*3/uL (ref 1.7–7.7)
Neutrophils Relative %: 57 %
Platelets: 135 10*3/uL — ABNORMAL LOW (ref 150–400)
RBC: 3.02 MIL/uL — ABNORMAL LOW (ref 3.87–5.11)
RDW: 17 % — ABNORMAL HIGH (ref 11.5–15.5)
WBC Morphology: INCREASED
WBC: 4.1 10*3/uL (ref 4.0–10.5)
nRBC: 1.7 % — ABNORMAL HIGH (ref 0.0–0.2)

## 2018-12-04 LAB — COMPREHENSIVE METABOLIC PANEL
ALT: 62 U/L — ABNORMAL HIGH (ref 0–44)
AST: 45 U/L — ABNORMAL HIGH (ref 15–41)
Albumin: 1.7 g/dL — ABNORMAL LOW (ref 3.5–5.0)
Alkaline Phosphatase: 363 U/L — ABNORMAL HIGH (ref 38–126)
Anion gap: 12 (ref 5–15)
BUN: 12 mg/dL (ref 6–20)
CO2: 27 mmol/L (ref 22–32)
Calcium: 9 mg/dL (ref 8.9–10.3)
Chloride: 108 mmol/L (ref 98–111)
Creatinine, Ser: 0.56 mg/dL (ref 0.44–1.00)
GFR calc Af Amer: >60 mL/min (ref >60)
Glucose, Bld: 80 mg/dL (ref 70–99)
Potassium: 3.1 mmol/L — ABNORMAL LOW (ref 3.5–5.1)
Sodium: 147 mmol/L — ABNORMAL HIGH (ref 135–145)
Total Bilirubin: 3.8 mg/dL — ABNORMAL HIGH (ref 0.3–1.2)
Total Protein: 4.8 g/dL — ABNORMAL LOW (ref 6.5–8.1)

## 2018-12-04 LAB — MAGNESIUM: Magnesium: 1.8 mg/dL (ref 1.7–2.4)

## 2018-12-04 MED ORDER — POTASSIUM CHLORIDE 10 MEQ/100ML IV SOLN
10.0000 meq | INTRAVENOUS | Status: AC
  Filled 2018-12-04: qty 100

## 2018-12-04 MED ORDER — LEVOTHYROXINE SODIUM 50 MCG PO TABS
75.0000 ug | ORAL_TABLET | Freq: Every day | ORAL | Status: DC
  Administered 2018-12-05 – 2018-12-12 (×7): 75 ug via ORAL
  Filled 2018-12-04 (×8): qty 1

## 2018-12-04 MED ORDER — POTASSIUM CHLORIDE 10 MEQ/100ML IV SOLN
10.0000 meq | INTRAVENOUS | Status: AC
  Administered 2018-12-04 (×3): 10 meq via INTRAVENOUS
  Filled 2018-12-04 (×2): qty 100

## 2018-12-04 NOTE — Progress Notes (Addendum)
Patient able to urinate in [facility] last night and this morning. RN needs encourage patient  to urinate in [facility]. Pt can tolerate ambulating and using BSC.  Will continue to monitor.

## 2018-12-04 NOTE — TOC Initial Note (Signed)
Transition of Care [facility]) - Initial/Assessment Note    Patient Details  Name: Cindy Ward MRN: [id number] Date of Birth: Mar 06, 1966  [facility] CM/SW Contact:    Nila Nephew, LCSW Phone Number: 12/04/2018, 2:08 PM  Clinical Narrative: Pt admitted from home where she resides alone. Received diagnosis of stage IV hodgkin lymphoma. Had day 1 cycle 1 chemo 11/19/2018 at that time planning to continue chemo next cycle 12/03/18, however chemotherapy has been on hold since due to pt's clinical status. At this point reports not sure when/if continuing with chemo. Pt has been confused intermittently during hospitalization- was oriented but drowsy during this interaction.  CSW discussed with pt that SNF is recommended however barriers are no active insurance (was laid off from job- medicaid pending per chart) and potential plan for further chemotherapy. Pt reports she hopes to go home at DC but was unable to identify standards that should be reached prior to that being safe option. Did state, "I am walking more now." CSW will continue to following during hospitalization to assist with disposition however at this point unsure of feasible plan- pt unable to admit to SNF due to barriers and does not have local support or resources.                    Expected Discharge Plan: [place] Barriers to Discharge: Inadequate or no insurance, Continued Medical Work up   Patient Goals and CMS Choice Patient states their goals for this hospitalization and ongoing recovery are:: figure out how to get home      Expected Discharge Plan and Services Expected Discharge Plan: [place] Discharge Planning Services: NA     Expected Discharge Date: (unknown)                        Prior Living Arrangements/Services     Patient language and need for interpreter reviewed:: No Do you feel safe going back to the place where you live?: Yes      Need for Family  Participation in Patient Care: Yes (Comment)(pt states her daughter is involved) Care giver support system in place?: No (comment)(pt was living alone without caregiving needs PTA)   Criminal Activity/Legal Involvement Pertinent to Current Situation/Hospitalization: No - Comment as needed  Activities of Daily Living Home Assistive Devices/Equipment: Contact lenses ADL Screening (condition at time of admission) Patient's cognitive ability adequate to safely complete daily activities?: Yes Is the patient deaf or have difficulty hearing?: No Does the patient have difficulty seeing, even when wearing glasses/contacts?: No Does the patient have difficulty concentrating, remembering, or making decisions?: No Patient able to express need for assistance with ADLs?: Yes Does the patient have difficulty dressing or bathing?: No Independently performs ADLs?: Yes (appropriate for developmental age) Does the patient have difficulty walking or climbing stairs?: No Weakness of Legs: Both Weakness of Arms/Hands: Both  Permission Sought/Granted Permission sought to share information with : Family Supports Permission granted to share information with : Yes, Verbal Permission Granted  Share Information with NAME: daughter [name]           Emotional Assessment Appearance:: Appears stated age Attitude/Demeanor/Rapport: (drowsy) Affect (typically observed): Pleasant Orientation: : Oriented to Self, Oriented to Place, Oriented to Situation Alcohol / Substance Use: Not Applicable Psych Involvement: No (comment)  Admission diagnosis:  FEVER Patient Active Problem List   Diagnosis Date Noted  . Total bilirubin, elevated   .  Palliative care by specialist   . Goals of care, counseling/discussion   . Enteritis due to Clostridium difficile   . Streptococcal bacteremia   . Neutropenic fever ([place])   . Oral thrush   . Normocytic anemia   . Hodgkin lymphoma ([place]) 11/18/2018  . Malnutrition of moderate  degree 11/16/2018  . Pancytopenia ([place])   . Unintentional weight loss 11/12/2018  . Hashimoto's thyroiditis 11/12/2018  . Goiter 11/12/2018  . Hypertension 11/12/2018  . Aortic atherosclerosis ([place]) 11/12/2018  . Fever 11/11/2018  . Splenomegaly 11/11/2018  . Liver enzyme elevation 11/11/2018  . Thrombocytopenia ([place]) 11/11/2018  . Lymphadenopathy 05/14/2018   PCP:  [name], MD Pharmacy:   [facility], [place], [place] [address] Phone: [phone] Fax: [phone]     Social Determinants of Health (SDOH) Interventions    Readmission Risk Interventions 30 Day Unplanned Readmission Risk Score     Admission (Current) from 11/11/2018 in [facility]  30 Day Unplanned Readmission Risk Score (%)  18 Filed at 12/04/2018 1200     This score is the patient's risk of an unplanned readmission within 30 days of being discharged (0 -100%). The score is based on dignosis, age, lab data, medications, orders, and past utilization.   Low:  0-14.9   Medium: 15-21.9   High: 22-29.9   Extreme: 30 and above       No flowsheet data found.   Sharren Bridge, MSW, LCSW Clinical Social Work 12/04/2018 [phone]

## 2018-12-04 NOTE — Progress Notes (Signed)
Physical Therapy Treatment Patient Details Name: Cindy Ward MRN: [id number] DOB: 12/29/1965 Today's Date: 12/04/2018    History of Present Illness 53 yo female admitted to ED on 11/11/18 for 9 month history of N/V, fatigue, and weight loss. Pt previously followed in [place], oncology reports inconclusive findings. 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 Pt with R iliac BM aspiration and core, awaiting results. Other PMH includes HTN, hypothyroidism.  Course complicated by sepsis and trasnferred to ICU on 11/25/18.    PT Comments    Pt with flat affect, minimal response to questions, but did participate. Assisted pt to bedside commode, then back to bed for bladder scan. Pt then ambulated 83' with RW in hall, a significant increase in distance from last visit in which she ambulated 20'. Pt fatigues quite quickly and has some confusion (she stated her height is " 7'8" ".     Follow Up Recommendations  SNF     Equipment Recommendations  None recommended by PT    Recommendations for Other Services       Precautions / Restrictions Precautions Precautions: Fall Precaution Comments: monitor HR Restrictions Weight Bearing Restrictions: No LUE Weight Bearing: Weight bearing as tolerated Other Position/Activity Restrictions: L UE-no lifting >10# for 2-3 weeks 2* biopsy  (s/p biopsy 11/12/18)      Mobility  Bed Mobility Overal bed mobility: Needs Assistance Bed Mobility: Supine to Sit;Sit to Supine     Supine to sit: HOB elevated;Supervision Sit to supine: HOB elevated;Supervision   General bed mobility comments: close guard for safety, HOB up, used rail  Transfers Overall transfer level: Needs assistance   Transfers: Sit to/from Stand;Stand Pivot Transfers Sit to Stand: Min guard;From elevated surface Stand pivot transfers: Min guard       General transfer comment: min/guard for  safety  Ambulation/Gait Ambulation/Gait assistance: Min guard Gait Distance (Feet): 80 Feet Assistive device: Rolling walker (2 wheeled)(vs "furniture walking") Gait Pattern/deviations: Step-through pattern;Trunk flexed Gait velocity: decr   General Gait Details: slow gait speed. LEs appeared to get weaker/fatigued as distance progressed-pt adopted more of a bil flexed knee posture.   Stairs             Wheelchair Mobility    Modified Rankin (Stroke Patients Only)       Balance Overall balance assessment: Needs assistance;History of Falls Sitting-balance support: No upper extremity supported Sitting balance-Leahy Scale: Good       Standing balance-Leahy Scale: Fair                              Cognition Arousal/Alertness: Awake/alert Behavior During Therapy: Flat affect Overall Cognitive Status: No family/caregiver present to determine baseline cognitive functioning Area of Impairment: Safety/judgement                         Safety/Judgement: Decreased awareness of deficits;Decreased awareness of safety   Problem Solving: Slow processing General Comments: when asked her height, pt stated, "7'8" ", when asked again she repeated 7'8"      Exercises      General Comments        Pertinent Vitals/Pain Pain Assessment: No/denies pain    Home Living                      Prior Function  PT Goals (current goals can now be found in the care plan section) Acute Rehab PT Goals Patient Stated Goal: none stated, minimal verbal response to questions PT Goal Formulation: With patient Time For Goal Achievement: 12/18/18 Potential to Achieve Goals: Good Progress towards PT goals: Progressing toward goals    Frequency    Min 2X/week      PT Plan Current plan remains appropriate    Co-evaluation              AM-PAC PT "6 Clicks" Mobility   Outcome Measure  Help needed turning from your back to your  side while in a flat bed without using bedrails?: A Little Help needed moving from lying on your back to sitting on the side of a flat bed without using bedrails?: A Little Help needed moving to and from a bed to a chair (including a wheelchair)?: A Little Help needed standing up from a chair using your arms (e.g., wheelchair or bedside chair)?: A Little Help needed to walk in hospital room?: A Little Help needed climbing 3-5 steps with a railing? : A Lot 6 Click Score: 17    End of Session Equipment Utilized During Treatment: Gait belt Activity Tolerance: Patient limited by fatigue Patient left: in bed;with call bell/phone within reach;with bed alarm set Nurse Communication: Mobility status(no recliner in room, RN and Financial controller notified) PT Visit Diagnosis: Other abnormalities of gait and mobility (R26.89);Difficulty in walking, not elsewhere classified (R26.2);Muscle weakness (generalized) (M62.81)     Time: 0086-7619 PT Time Calculation (min) (ACUTE ONLY): 24 min  Charges:  $Gait Training: 8-22 mins $Therapeutic Activity: 8-22 mins                     Blondell Reveal Kistler PT 12/04/2018  Acute Rehabilitation Services Pager [phone] Office [phone]

## 2018-12-04 NOTE — Progress Notes (Signed)
[facility] INPATIENT PROGRESS NOTE  SUBJECTIVE: Cindy Ward 53 y.o. female with stage IV Hodgkin lymphoma.  Status post day 1 cycle 1 AVD plus Adcetris on 11/19/2018.  Remains afebrile.  Remains on ceftriaxone for bacteremia.  Repeat blood cultures were negative.  Denies abdominal pain, nausea, vomiting, diarrhea.  Requiring frequent and out catheter.  Foley catheter is being placed today.  Urology consult has been recommended.  Has now developed left breast redness and swelling.  An ultrasound of the left breast has been ordered.  She also reports vaginal bleeding and a pelvic ultrasound has been ordered.  ALLERGIES:  has No Known Allergies.  MEDICATIONS:  Current Facility-Administered Medications  Medication Dose Route Frequency Provider Last Rate Last Dose  . 0.9 %  sodium chloride infusion   Intravenous PRN Rai, Ripudeep K, MD 10 mL/hr at 11/30/18 1420 1,000 mL at 11/30/18 1420  . 0.9 %  sodium chloride infusion   Intravenous Once Rai, Ripudeep K, MD      . acetaminophen (TYLENOL) tablet 650 mg  650 mg Oral Q6H PRN Rai, Ripudeep K, MD   650 mg at 11/25/18 0811   Or  . acetaminophen (TYLENOL) suppository 650 mg  650 mg Rectal Q6H PRN Rai, Ripudeep K, MD   650 mg at 11/25/18 1509  . cefTRIAXone (ROCEPHIN) 2 g in sodium chloride 0.9 % 100 mL IVPB  2 g Intravenous QHS Flora Lipps, MD   Stopped at 12/03/18 2124  . Chlorhexidine Gluconate Cloth 2 % PADS 6 each  6 each Topical Daily Rai, Ripudeep K, MD   6 each at 12/03/18 1340  . feeding supplement (BOOST / RESOURCE BREEZE) liquid 1 Container  1 Container Oral Daily PRN Rai, Ripudeep K, MD      . HYDROmorphone (DILAUDID) injection 0.5-1 mg  0.5-1 mg Intravenous Q4H PRN Rai, Ripudeep K, MD   1 mg at 11/26/18 1058  . ibuprofen (ADVIL,MOTRIN) tablet 400 mg  400 mg Oral Q8H PRN Rai, Ripudeep K, MD   400 mg at 11/18/18 2334  . iohexol (OMNIPAQUE) 300 MG/ML solution 15 mL  15 mL Oral Once PRN Rai, Ripudeep K, MD   30 mL at 11/18/18  1740  . ipratropium-albuterol (DUONEB) 0.5-2.5 (3) MG/3ML nebulizer solution 3 mL  3 mL Nebulization Q4H PRN Amin, Ankit Chirag, MD      . levothyroxine (SYNTHROID, LEVOTHROID) injection 37.5 mcg  37.5 mcg Intravenous Daily Rai, Ripudeep K, MD   37.5 mcg at 12/03/18 1004  . lip balm (CARMEX) ointment   Topical PRN Rai, Ripudeep K, MD      . MEDLINE mouth rinse  15 mL Mouth Rinse BID Rai, Ripudeep K, MD   15 mL at 12/03/18 2055  . multivitamin with minerals tablet 1 tablet  1 tablet Oral Daily Amin, Ankit Chirag, MD   1 tablet at 12/02/18 1015  . nebivolol (BYSTOLIC) tablet 10 mg  10 mg Oral Daily Amin, Ankit Chirag, MD   10 mg at 12/03/18 1008  . nystatin (MYCOSTATIN) 100000 UNIT/ML suspension 500,000 Units  5 mL Oral QID Rai, Ripudeep K, MD   500,000 Units at 12/02/18 2151  . ondansetron (ZOFRAN) tablet 4 mg  4 mg Oral Q6H PRN Rai, Ripudeep K, MD       Or  . ondansetron (ZOFRAN) injection 4 mg  4 mg Intravenous Q6H PRN Rai, Ripudeep K, MD   4 mg at 11/28/18 1740  . oxyCODONE (Oxy IR/ROXICODONE) immediate release tablet 5-10 mg  5-10 mg  Oral Q4H PRN Rai, Ripudeep K, MD   5 mg at 11/28/18 1739  . phenol (CHLORASEPTIC) mouth spray 1 spray  1 spray Mouth/Throat PRN Rai, Ripudeep K, MD      . potassium chloride 10 mEq in 100 mL IVPB  10 mEq Intravenous Q1 Hr x 3 Pokhrel, Laxman, MD      . protein supplement (UNJURY CHICKEN SOUP) powder 8 oz  8 oz Oral BID PRN Rai, Ripudeep K, MD      . scopolamine (TRANSDERM-SCOP) 1 MG/3DAYS 1.5 mg  1 patch Transdermal Q72H Rai, Ripudeep K, MD   1.5 mg at 12/03/18 1813  . sodium chloride flush (NS) 0.9 % injection 10-40 mL  10-40 mL Intracatheter PRN Rai, Ripudeep K, MD      . vancomycin (VANCOCIN) 50 mg/mL oral solution 125 mg  125 mg Oral QID Amin, Ankit Chirag, MD   125 mg at 12/03/18 2054    REVIEW OF SYSTEMS:   Review of Systems  Constitutional: Positive for malaise/fatigue. Negative for chills and fever.  HENT: Negative.   Eyes: Negative.   Respiratory:  Negative.   Cardiovascular: Negative.   Gastrointestinal: Negative.   Genitourinary: Negative.   Musculoskeletal: Negative.   Skin: Negative.   Neurological: Negative.   Endo/Heme/Allergies: Negative.   Psychiatric/Behavioral: Negative.   Breast: Left breast with some tenderness and swelling.  PHYSICAL EXAMINATION:  Vital signs in last 24 hours: Temp:  [97.6 F (36.4 C)-98.5 F (36.9 C)] 97.9 F (36.6 C) (03/11 0536) Pulse Rate:  [91-94] 92 (03/11 0536) Resp:  [18-20] 20 (03/11 0536) BP: (96-120)/(61-79) 119/61 (03/11 0536) SpO2:  [93 %-96 %] 93 % (03/11 0536) Weight:  [143 lb 4.8 oz (65 kg)] 143 lb 4.8 oz (65 kg) (03/11 0421) Weight change:  Last BM Date: 12/02/18  ECOG PERFORMANCE STATUS: 1 - Symptomatic but completely ambulatory  Intake/Output from previous day: 03/10 0701 - 03/11 0700 In: 1001.6 [P.O.:660; I.V.:240; IV Piggyback:101.6] Out: 1950 [Urine:1950] General: Alert, awake without distress. Head: Normocephalic atraumatic. Mouth: mucous membranes moist, pharynx normal without lesions Eyes: No scleral icterus.  Pupils are equal and round reactive to light. Resp: clear to auscultation bilaterally without rhonchi or wheezes or dullness to percussion. Cardio: regular rate and rhythm, S1, S2 normal, no murmur, click, rub or gallop GI: soft, non-tender; bowel sounds normal; no masses,  no organomegaly Musculoskeletal: No joint deformity or effusion. Neurological: No motor, sensory deficits. Skin: No rashes or lesions. Breast: Right breast without any redness or masses.  Left breast is firm with redness.   LABORATORY DATA: Lab Results  Component Value Date   WBC 4.1 12/04/2018   HGB 8.5 (L) 12/04/2018   HCT 27.5 (L) 12/04/2018   MCV 91.1 12/04/2018   PLT 135 (L) 12/04/2018    CMP Latest Ref Rng & Units 12/04/2018 12/03/2018 12/03/2018  Glucose 70 - 99 mg/dL 80 79 94  BUN 6 - 20 mg/dL '12 10 11  ' Creatinine 0.44 - 1.00 mg/dL 0.56 0.54 0.57  Sodium 135 - 145  mmol/L 147(H) 145 147(H)  Potassium 3.5 - 5.1 mmol/L 3.1(L) 3.1(L) 2.9(L)  Chloride 98 - 111 mmol/L 108 111 111  CO2 22 - 32 mmol/L '27 25 26  ' Calcium 8.9 - 10.3 mg/dL 9.0 8.0(L) 8.6(L)  Total Protein 6.5 - 8.1 g/dL 4.8(L) - 4.8(L)  Total Bilirubin 0.3 - 1.2 mg/dL 3.8(H) - 4.2(H)  Alkaline Phos 38 - 126 U/L 363(H) - 132(H)  AST 15 - 41 U/L 45(H) - 41  ALT 0 -  44 U/L 62(H) - 66(H)    RADIOGRAPHIC STUDIES:  Dg Chest 1 View  Result Date: 11/26/2018 CLINICAL DATA:  Tachypnea.  Stage IV Hodgkin's lymphoma. EXAM: CHEST  1 VIEW COMPARISON:  Chest radiograph November 25, 2018 FINDINGS: Cardiac silhouette is mildly enlarged and unchanged. Pulmonary vascular congestion with small LEFT pleural effusion. No pneumothorax. Strandy densities LEFT lung base. Surgical clips LEFT axilla. RIGHT single-lumen chest Port-A-Cath distal tip projecting mid superior vena cava. Osseous structures are unchanged. IMPRESSION: Stable cardiomegaly and pulmonary vascular congestion. Small LEFT pleural effusion and LEFT lung base atelectasis. Electronically Signed   By: Elon Alas M.D.   On: 11/26/2018 20:32   Dg Chest 2 View  Result Date: 11/21/2018 CLINICAL DATA:  History of lymphoma.  Patient weak and flushed. EXAM: CHEST - 2 VIEW COMPARISON:  Chest CT, 11/18/2018.  Chest radiographs, 11/11/2018. FINDINGS: Mild enlargement of the cardiopericardial silhouette. No mediastinal or hilar masses. No convincing adenopathy. Small bilateral pleural effusions. Mild dependent lower lobe atelectasis. Lungs otherwise clear. No pneumothorax. Right anterior chest wall, internal jugular, Port-A-Cath is stable from the prior chest CT. Skeletal structures are unremarkable. IMPRESSION: 1. Findings are similar to the recent prior chest CT. Enlargement of the cardiopericardial silhouette is consistent with the pericardial effusion noted on CT. There are small bilateral pleural effusions with associated lower lobe atelectasis. No convincing  pneumonia and no pulmonary edema. Electronically Signed   By: Lajean Manes M.D.   On: 11/21/2018 15:05   Ct Head Wo Contrast  Result Date: 11/25/2018 CLINICAL DATA:  Hodgkin's lymphoma, fevers, headache EXAM: CT HEAD WITHOUT CONTRAST TECHNIQUE: Contiguous axial images were obtained from the base of the skull through the vertex without intravenous contrast. COMPARISON:  11/25/2018 FINDINGS: Brain: No evidence of acute infarction, hemorrhage, hydrocephalus, extra-axial collection or mass lesion/mass effect. Mild periventricular white matter hypodensity. Vascular: No hyperdense vessel or unexpected calcification. Skull: Normal. Negative for fracture or focal lesion. Sinuses/Orbits: No acute finding. Other: None. IMPRESSION: No acute intracranial pathology. No non-contrast CT findings to explain headache. Mild small-vessel white matter disease. Electronically Signed   By: Eddie Candle M.D.   On: 11/25/2018 12:37   Ct Soft Tissue Neck W Contrast  Result Date: 11/18/2018 CLINICAL DATA:  Initial evaluation for Hodgkin's lymphoma, initial workup. EXAM: CT NECK WITH CONTRAST CT CHEST, ABDOMEN, AND PELVIS WITH CONTRAST TECHNIQUE: Multidetector CT imaging of the chest, abdomen and pelvis was performed following the standard protocol during bolus administration of intravenous contrast. CONTRAST:  153m OMNIPAQUE IOHEXOL 300 MG/ML SOLN, 353mOMNIPAQUE IOHEXOL 300 MG/ML SOLN COMPARISON:  None. FINDINGS: CT NECK FINDINGS: Pharynx and larynx: Oral cavity within normal limits without mass lesion or loculated collection. Patient is largely edentulous. Calcified tonsilliths noted within the right palatine tonsil. Tonsils themselves symmetric and within normal limits bilaterally. Parapharyngeal fat maintained. Nasopharynx normal. No retropharyngeal collection. Epiglottis normal. Vallecula clear. Remainder of the hypopharynx and supraglottic larynx normal. Glottis within normal limits. Subglottic airway clear. Salivary glands:  Parotid and submandibular glands within normal limits. Thyroid: Thyroid diffusely enlarged without discrete nodule or mass. Lymph nodes: Mildly prominent level II lymph nodes measure up to 9 mm in short axis bilaterally. Left level III nodes measure up to 9 mm as well. Increased number of shotty subcentimeter nodes within the neck bilaterally, left slightly worse than right. No other pathologically enlarged lymph nodes identified within the neck. Vascular: Normal intravascular enhancement seen throughout the neck. Right IJ approach central venous catheter in place. Soft tissue swelling with stranding and scattered  foci of soft tissue emphysema within the right neck likely related to central line placement. Limited intracranial: Unremarkable Visualized orbits: Partially visualized inferior globes and orbits unremarkable. Mastoids and visualized paranasal sinuses: Mucosal thickening noted within the visualized maxillary and sphenoid sinuses. Visualized paranasal sinuses are otherwise clear. Visualize mastoids and middle ear cavities are clear. Skeleton: No acute osseous abnormality. No discrete lytic or blastic osseous lesions. Mild cervical spondylolysis present at C5-6. Other: None. CT CHEST FINDINGS Cardiovascular: Normal intravascular enhancement seen throughout the intra-abdominal aorta without aneurysm or other acute abnormality. Minimal atheromatous plaque within the aortic arch. Visualized great vessels within normal limits. Heart size normal. Small moderate pericardial effusion, simple fluid density. Mediastinum/Nodes: Enlarged nodes within the upper mediastinum measure up to 15 mm in short axis (series 3, image 11). No appreciable supraclavicular adenopathy. No other pathologically enlarged mediastinal lymph nodes. Mild soft tissue density within the right hilum without frank adenopathy. Mildly enlarged 11 mm left hilar node (series 3, image 28). Enlarged axillary adenopathy seen bilaterally, with the  largest node on the left measuring 2.1 cm in short axis (series 3, image 16). Largest node on the right measures 1.7 cm in short axis (series 3, image 8). Scattered soft tissue stranding with emphysema and fluid density overlying the left axilla likely related to recent lymph node biopsy, partially visualized. Esophagus within normal limits. Lungs/Pleura: Tracheobronchial tree intact and patent. Moderate layering bilateral pleural effusions with associated atelectasis. No other airspace consolidation. No pulmonary edema. No pneumothorax. 4 mm subpleural ground-glass nodule present at the anterior right upper lobe (series 4, image 57), indeterminate. No other worrisome pulmonary nodule or mass. Musculoskeletal: No acute osseous finding. No discrete lytic or blastic osseous lesions. CT ABDOMEN PELVIS FINDINGS Hepatobiliary: Liver demonstrates a normal contrast enhanced appearance. Gallbladder within normal limits. No biliary dilatation. Pancreas: Pancreas within normal limits. Spleen: Spleen enlarged measuring 14.3 cm in craniocaudad dimension. Few subtle hypodensities noted within the spleen, largest of which measures approximately 12 mm (series 3, image 57), indeterminate. Adrenals/Urinary Tract: Adrenal glands are normal. Kidneys equal in size with symmetric enhancement. No nephrolithiasis, hydronephrosis, or focal enhancing renal mass. No hydroureter. Bladder within normal limits. Stomach/Bowel: Stomach within normal limits. No evidence for bowel obstruction. Normal appendix. No acute inflammatory changes seen about the bowels. Vascular/Lymphatic: Normal intravascular enhancement seen throughout the intra-abdominal aorta. Mild aorto bi-iliac atherosclerotic disease. No aneurysm. Mesenteric vessels patent proximally. Enlarged 15 mm aortocaval lymph node (series 3, image 7). Additional multifocal shotty subcentimeter aortocaval and periaortic lymph nodes noted. No other pathologically enlarged intra-abdominal lymph  nodes identified. Mildly prominent 12 mm left inguinal lymph nodes noted. No other adenopathy within the pelvis. Reproductive: Uterus within normal limits.  Ovaries normal. Other: Moderate volume free fluid within the pelvis, measuring simple fluid density. No free intraperitoneal air. Musculoskeletal: Scattered anasarca noted within the external soft tissues. No acute osseous finding. No discrete lytic or blastic osseous lesions. IMPRESSION: CT NECK IMPRESSION 1. Increased number of shoddy subcentimeter lymph nodes throughout the neck bilaterally, left slightly worse than right. No pathologically enlarged lymph nodes identified. 2. Mild diffuse thyromegaly without discrete thyroid nodule or mass. 3. Mild soft tissue stranding with emphysema within the right lateral neck related to recent right-sided Port-A-Cath placement. CT CHEST IMPRESSION 1. Enlarged bilateral axillary and upper mediastinal adenopathy as above, likely related to provided history of lymphoma. Additional borderline enlarged left hilar node may be related to lymphoma or possibly reactive in nature. Attention at follow-up recommended. 2. Moderate layering bilateral pleural effusions  with associated atelectasis. 3. Small to moderate pericardial effusion. 4. 4 mm right upper lobe ground-glass nodule, indeterminate. Attention at follow-up recommended. CT ABDOMEN AND PELVIS IMPRESSION 1. Enlarged 15 mm aortocaval lymph node with mildly enlarged 12 mm left inguinal lymph nodes. Additional increased number of shotty subcentimeter retroperitoneal lymph nodes. Findings most likely related to history of lymphoma. 2. Splenomegaly. 3. Moderate volume free fluid within the pelvis, of uncertain etiology, but could be physiologic and/or related overall volume status. 4. Mild diffuse anasarca. Electronically Signed   By: Jeannine Boga M.D.   On: 11/18/2018 19:56   Ct Chest W Contrast  Result Date: 11/18/2018 CLINICAL DATA:  Initial evaluation for  Hodgkin's lymphoma, initial workup. EXAM: CT NECK WITH CONTRAST CT CHEST, ABDOMEN, AND PELVIS WITH CONTRAST TECHNIQUE: Multidetector CT imaging of the chest, abdomen and pelvis was performed following the standard protocol during bolus administration of intravenous contrast. CONTRAST:  158m OMNIPAQUE IOHEXOL 300 MG/ML SOLN, 351mOMNIPAQUE IOHEXOL 300 MG/ML SOLN COMPARISON:  None. FINDINGS: CT NECK FINDINGS: Pharynx and larynx: Oral cavity within normal limits without mass lesion or loculated collection. Patient is largely edentulous. Calcified tonsilliths noted within the right palatine tonsil. Tonsils themselves symmetric and within normal limits bilaterally. Parapharyngeal fat maintained. Nasopharynx normal. No retropharyngeal collection. Epiglottis normal. Vallecula clear. Remainder of the hypopharynx and supraglottic larynx normal. Glottis within normal limits. Subglottic airway clear. Salivary glands: Parotid and submandibular glands within normal limits. Thyroid: Thyroid diffusely enlarged without discrete nodule or mass. Lymph nodes: Mildly prominent level II lymph nodes measure up to 9 mm in short axis bilaterally. Left level III nodes measure up to 9 mm as well. Increased number of shotty subcentimeter nodes within the neck bilaterally, left slightly worse than right. No other pathologically enlarged lymph nodes identified within the neck. Vascular: Normal intravascular enhancement seen throughout the neck. Right IJ approach central venous catheter in place. Soft tissue swelling with stranding and scattered foci of soft tissue emphysema within the right neck likely related to central line placement. Limited intracranial: Unremarkable Visualized orbits: Partially visualized inferior globes and orbits unremarkable. Mastoids and visualized paranasal sinuses: Mucosal thickening noted within the visualized maxillary and sphenoid sinuses. Visualized paranasal sinuses are otherwise clear. Visualize mastoids and  middle ear cavities are clear. Skeleton: No acute osseous abnormality. No discrete lytic or blastic osseous lesions. Mild cervical spondylolysis present at C5-6. Other: None. CT CHEST FINDINGS Cardiovascular: Normal intravascular enhancement seen throughout the intra-abdominal aorta without aneurysm or other acute abnormality. Minimal atheromatous plaque within the aortic arch. Visualized great vessels within normal limits. Heart size normal. Small moderate pericardial effusion, simple fluid density. Mediastinum/Nodes: Enlarged nodes within the upper mediastinum measure up to 15 mm in short axis (series 3, image 11). No appreciable supraclavicular adenopathy. No other pathologically enlarged mediastinal lymph nodes. Mild soft tissue density within the right hilum without frank adenopathy. Mildly enlarged 11 mm left hilar node (series 3, image 28). Enlarged axillary adenopathy seen bilaterally, with the largest node on the left measuring 2.1 cm in short axis (series 3, image 16). Largest node on the right measures 1.7 cm in short axis (series 3, image 8). Scattered soft tissue stranding with emphysema and fluid density overlying the left axilla likely related to recent lymph node biopsy, partially visualized. Esophagus within normal limits. Lungs/Pleura: Tracheobronchial tree intact and patent. Moderate layering bilateral pleural effusions with associated atelectasis. No other airspace consolidation. No pulmonary edema. No pneumothorax. 4 mm subpleural ground-glass nodule present at the anterior right upper lobe (series  4, image 57), indeterminate. No other worrisome pulmonary nodule or mass. Musculoskeletal: No acute osseous finding. No discrete lytic or blastic osseous lesions. CT ABDOMEN PELVIS FINDINGS Hepatobiliary: Liver demonstrates a normal contrast enhanced appearance. Gallbladder within normal limits. No biliary dilatation. Pancreas: Pancreas within normal limits. Spleen: Spleen enlarged measuring 14.3 cm  in craniocaudad dimension. Few subtle hypodensities noted within the spleen, largest of which measures approximately 12 mm (series 3, image 57), indeterminate. Adrenals/Urinary Tract: Adrenal glands are normal. Kidneys equal in size with symmetric enhancement. No nephrolithiasis, hydronephrosis, or focal enhancing renal mass. No hydroureter. Bladder within normal limits. Stomach/Bowel: Stomach within normal limits. No evidence for bowel obstruction. Normal appendix. No acute inflammatory changes seen about the bowels. Vascular/Lymphatic: Normal intravascular enhancement seen throughout the intra-abdominal aorta. Mild aorto bi-iliac atherosclerotic disease. No aneurysm. Mesenteric vessels patent proximally. Enlarged 15 mm aortocaval lymph node (series 3, image 7). Additional multifocal shotty subcentimeter aortocaval and periaortic lymph nodes noted. No other pathologically enlarged intra-abdominal lymph nodes identified. Mildly prominent 12 mm left inguinal lymph nodes noted. No other adenopathy within the pelvis. Reproductive: Uterus within normal limits.  Ovaries normal. Other: Moderate volume free fluid within the pelvis, measuring simple fluid density. No free intraperitoneal air. Musculoskeletal: Scattered anasarca noted within the external soft tissues. No acute osseous finding. No discrete lytic or blastic osseous lesions. IMPRESSION: CT NECK IMPRESSION 1. Increased number of shoddy subcentimeter lymph nodes throughout the neck bilaterally, left slightly worse than right. No pathologically enlarged lymph nodes identified. 2. Mild diffuse thyromegaly without discrete thyroid nodule or mass. 3. Mild soft tissue stranding with emphysema within the right lateral neck related to recent right-sided Port-A-Cath placement. CT CHEST IMPRESSION 1. Enlarged bilateral axillary and upper mediastinal adenopathy as above, likely related to provided history of lymphoma. Additional borderline enlarged left hilar node may be  related to lymphoma or possibly reactive in nature. Attention at follow-up recommended. 2. Moderate layering bilateral pleural effusions with associated atelectasis. 3. Small to moderate pericardial effusion. 4. 4 mm right upper lobe ground-glass nodule, indeterminate. Attention at follow-up recommended. CT ABDOMEN AND PELVIS IMPRESSION 1. Enlarged 15 mm aortocaval lymph node with mildly enlarged 12 mm left inguinal lymph nodes. Additional increased number of shotty subcentimeter retroperitoneal lymph nodes. Findings most likely related to history of lymphoma. 2. Splenomegaly. 3. Moderate volume free fluid within the pelvis, of uncertain etiology, but could be physiologic and/or related overall volume status. 4. Mild diffuse anasarca. Electronically Signed   By: Jeannine Boga M.D.   On: 11/18/2018 19:56   Ct Abdomen Pelvis W Contrast  Result Date: 11/27/2018 CLINICAL DATA:  Inpatient. Non-Hodgkin lymphoma. Ongoing chemotherapy. Abdominal pain. Rising bilirubin. EXAM: CT ABDOMEN AND PELVIS WITH CONTRAST TECHNIQUE: Multidetector CT imaging of the abdomen and pelvis was performed using the standard protocol following bolus administration of intravenous contrast. CONTRAST:  124m OMNIPAQUE IOHEXOL 300 MG/ML  SOLN COMPARISON:  11/18/2018 CT chest, abdomen and pelvis. FINDINGS: Lower chest: Small dependent bilateral pleural effusions with passive dependent bibasilar atelectasis, unchanged. Small pericardial effusion appears stable. Hepatobiliary: Normal liver size. No liver mass. Normal gallbladder with no radiopaque cholelithiasis. No biliary ductal dilatation. Pancreas: Normal, with no mass or duct dilation. Spleen: Spleen has decreased and is now normal in size. Craniocaudal splenic length 11.4 cm, previously 13.3 cm. No splenic mass. Adrenals/Urinary Tract: Normal adrenals. Mild bilateral hydroureteronephrosis to the level of the ureterovesical junction. Symmetric mildly delayed contrast nephrograms  bilaterally. New vague small low-attenuation 1.6 cm renal cortical focus in the lateral upper left  kidney (series 7/image 12). Otherwise no renal lesions. Markedly distended and otherwise normal bladder. Stomach/Bowel: Normal non-distended stomach. Normal caliber small bowel with no small bowel wall thickening. Normal appendix. Normal large bowel with no diverticulosis, large bowel wall thickening or pericolonic fat stranding. Vascular/Lymphatic: Atherosclerotic nonaneurysmal abdominal aorta. Patent portal, splenic, hepatic and renal veins. Left inguinal lymphadenopathy is decreased, for example previously 1.2 cm, now 0.9 cm (series 2/image 84). Retroperitoneal adenopathy in aortocaval and left para-aortic chains has decreased. Representative 0.8 cm aortocaval node (series 2/image 31), previously 1.2 cm. Representative 0.8 cm left para-aortic node (series 2/image 30), previously 1.2 cm. No pathologically enlarged abdominopelvic nodes. Reproductive: Grossly normal uterus.  No adnexal mass. Other: Trace free fluid in pelvic cul-de-sac. No focal fluid collection. No pneumoperitoneum. Mild anasarca, similar. Musculoskeletal: No aggressive appearing focal osseous lesions. Mild lumbar spondylosis. IMPRESSION: 1. Acute urinary retention with massively distended urinary bladder and mild bilateral hydroureteronephrosis. Recommend urgent bladder catheterization. 2. Evidence of treatment response. Spleen is decreased and now normal in size. Retroperitoneal and left inguinal lymphadenopathy is decreased, with no residual pathologically enlarged abdominopelvic nodes. 3. Small dependent bilateral pleural effusions with passive dependent bibasilar atelectasis, stable. 4. Stable small pericardial effusion. 5. Aortic Atherosclerosis (ICD10-I70.0). These results were called by telephone at the time of interpretation on 11/27/2018 at 11:07 am to [place], who verbally acknowledged these results. Per the RN, a bladder  catheterization was just completed with 2075 cc of urine drained. Electronically Signed   By: Ilona Sorrel M.D.   On: 11/27/2018 11:31   Ct Abdomen Pelvis W Contrast  Result Date: 11/18/2018 CLINICAL DATA:  Initial evaluation for Hodgkin's lymphoma, initial workup. EXAM: CT NECK WITH CONTRAST CT CHEST, ABDOMEN, AND PELVIS WITH CONTRAST TECHNIQUE: Multidetector CT imaging of the chest, abdomen and pelvis was performed following the standard protocol during bolus administration of intravenous contrast. CONTRAST:  114m OMNIPAQUE IOHEXOL 300 MG/ML SOLN, 310mOMNIPAQUE IOHEXOL 300 MG/ML SOLN COMPARISON:  None. FINDINGS: CT NECK FINDINGS: Pharynx and larynx: Oral cavity within normal limits without mass lesion or loculated collection. Patient is largely edentulous. Calcified tonsilliths noted within the right palatine tonsil. Tonsils themselves symmetric and within normal limits bilaterally. Parapharyngeal fat maintained. Nasopharynx normal. No retropharyngeal collection. Epiglottis normal. Vallecula clear. Remainder of the hypopharynx and supraglottic larynx normal. Glottis within normal limits. Subglottic airway clear. Salivary glands: Parotid and submandibular glands within normal limits. Thyroid: Thyroid diffusely enlarged without discrete nodule or mass. Lymph nodes: Mildly prominent level II lymph nodes measure up to 9 mm in short axis bilaterally. Left level III nodes measure up to 9 mm as well. Increased number of shotty subcentimeter nodes within the neck bilaterally, left slightly worse than right. No other pathologically enlarged lymph nodes identified within the neck. Vascular: Normal intravascular enhancement seen throughout the neck. Right IJ approach central venous catheter in place. Soft tissue swelling with stranding and scattered foci of soft tissue emphysema within the right neck likely related to central line placement. Limited intracranial: Unremarkable Visualized orbits: Partially visualized  inferior globes and orbits unremarkable. Mastoids and visualized paranasal sinuses: Mucosal thickening noted within the visualized maxillary and sphenoid sinuses. Visualized paranasal sinuses are otherwise clear. Visualize mastoids and middle ear cavities are clear. Skeleton: No acute osseous abnormality. No discrete lytic or blastic osseous lesions. Mild cervical spondylolysis present at C5-6. Other: None. CT CHEST FINDINGS Cardiovascular: Normal intravascular enhancement seen throughout the intra-abdominal aorta without aneurysm or other acute abnormality. Minimal atheromatous plaque within the aortic arch. Visualized great  vessels within normal limits. Heart size normal. Small moderate pericardial effusion, simple fluid density. Mediastinum/Nodes: Enlarged nodes within the upper mediastinum measure up to 15 mm in short axis (series 3, image 11). No appreciable supraclavicular adenopathy. No other pathologically enlarged mediastinal lymph nodes. Mild soft tissue density within the right hilum without frank adenopathy. Mildly enlarged 11 mm left hilar node (series 3, image 28). Enlarged axillary adenopathy seen bilaterally, with the largest node on the left measuring 2.1 cm in short axis (series 3, image 16). Largest node on the right measures 1.7 cm in short axis (series 3, image 8). Scattered soft tissue stranding with emphysema and fluid density overlying the left axilla likely related to recent lymph node biopsy, partially visualized. Esophagus within normal limits. Lungs/Pleura: Tracheobronchial tree intact and patent. Moderate layering bilateral pleural effusions with associated atelectasis. No other airspace consolidation. No pulmonary edema. No pneumothorax. 4 mm subpleural ground-glass nodule present at the anterior right upper lobe (series 4, image 57), indeterminate. No other worrisome pulmonary nodule or mass. Musculoskeletal: No acute osseous finding. No discrete lytic or blastic osseous lesions. CT  ABDOMEN PELVIS FINDINGS Hepatobiliary: Liver demonstrates a normal contrast enhanced appearance. Gallbladder within normal limits. No biliary dilatation. Pancreas: Pancreas within normal limits. Spleen: Spleen enlarged measuring 14.3 cm in craniocaudad dimension. Few subtle hypodensities noted within the spleen, largest of which measures approximately 12 mm (series 3, image 57), indeterminate. Adrenals/Urinary Tract: Adrenal glands are normal. Kidneys equal in size with symmetric enhancement. No nephrolithiasis, hydronephrosis, or focal enhancing renal mass. No hydroureter. Bladder within normal limits. Stomach/Bowel: Stomach within normal limits. No evidence for bowel obstruction. Normal appendix. No acute inflammatory changes seen about the bowels. Vascular/Lymphatic: Normal intravascular enhancement seen throughout the intra-abdominal aorta. Mild aorto bi-iliac atherosclerotic disease. No aneurysm. Mesenteric vessels patent proximally. Enlarged 15 mm aortocaval lymph node (series 3, image 7). Additional multifocal shotty subcentimeter aortocaval and periaortic lymph nodes noted. No other pathologically enlarged intra-abdominal lymph nodes identified. Mildly prominent 12 mm left inguinal lymph nodes noted. No other adenopathy within the pelvis. Reproductive: Uterus within normal limits.  Ovaries normal. Other: Moderate volume free fluid within the pelvis, measuring simple fluid density. No free intraperitoneal air. Musculoskeletal: Scattered anasarca noted within the external soft tissues. No acute osseous finding. No discrete lytic or blastic osseous lesions. IMPRESSION: CT NECK IMPRESSION 1. Increased number of shoddy subcentimeter lymph nodes throughout the neck bilaterally, left slightly worse than right. No pathologically enlarged lymph nodes identified. 2. Mild diffuse thyromegaly without discrete thyroid nodule or mass. 3. Mild soft tissue stranding with emphysema within the right lateral neck related to  recent right-sided Port-A-Cath placement. CT CHEST IMPRESSION 1. Enlarged bilateral axillary and upper mediastinal adenopathy as above, likely related to provided history of lymphoma. Additional borderline enlarged left hilar node may be related to lymphoma or possibly reactive in nature. Attention at follow-up recommended. 2. Moderate layering bilateral pleural effusions with associated atelectasis. 3. Small to moderate pericardial effusion. 4. 4 mm right upper lobe ground-glass nodule, indeterminate. Attention at follow-up recommended. CT ABDOMEN AND PELVIS IMPRESSION 1. Enlarged 15 mm aortocaval lymph node with mildly enlarged 12 mm left inguinal lymph nodes. Additional increased number of shotty subcentimeter retroperitoneal lymph nodes. Findings most likely related to history of lymphoma. 2. Splenomegaly. 3. Moderate volume free fluid within the pelvis, of uncertain etiology, but could be physiologic and/or related overall volume status. 4. Mild diffuse anasarca. Electronically Signed   By: Jeannine Boga M.D.   On: 11/18/2018 19:56   Ct  Biopsy  Result Date: 11/15/2018 INDICATION: Pancytopenia, concern for lymphoproliferative process EXAM: CT GUIDED RIGHT ILIAC BONE MARROW ASPIRATION AND CORE BIOPSY Date:  11/15/2018 11/15/2018 10:13 am Radiologist:  M. Daryll Brod, MD Guidance:  CT FLUOROSCOPY TIME:  Fluoroscopy Time: None. MEDICATIONS: 1% lidocaine local ANESTHESIA/SEDATION: 2.0 mg IV Versed; 50 mcg IV Fentanyl Moderate Sedation Time:  10 minutes The patient was continuously monitored during the procedure by the interventional radiology nurse under my direct supervision. CONTRAST:  None. COMPLICATIONS: None PROCEDURE: Informed consent was obtained from the patient following explanation of the procedure, risks, benefits and alternatives. The patient understands, agrees and consents for the procedure. All questions were addressed. A time out was performed. The patient was positioned prone and  non-contrast localization CT was performed of the pelvis to demonstrate the iliac marrow spaces. Maximal barrier sterile technique utilized including caps, mask, sterile gowns, sterile gloves, large sterile drape, hand hygiene, and Betadine prep. Under sterile conditions and local anesthesia, an 11 gauge coaxial bone biopsy needle was advanced into the right iliac marrow space. Needle position was confirmed with CT imaging. Initially, bone marrow aspiration was performed. Next, the 11 gauge outer cannula was utilized to obtain a right iliac bone marrow core biopsy. Needle was removed. Hemostasis was obtained with compression. The patient tolerated the procedure well. Samples were prepared with the cytotechnologist. No immediate complications. IMPRESSION: CT guided right iliac bone marrow aspiration and core biopsy. Electronically Signed   By: Jerilynn Mages.  Shick M.D.   On: 11/15/2018 11:25   Dg Chest Port 1 View  Result Date: 11/25/2018 CLINICAL DATA:  Dyspnea. EXAM: PORTABLE CHEST 1 VIEW COMPARISON:  November 21, 2018 FINDINGS: The right Port-A-Cath is stable. Small effusion and left basilar opacity remain. Mild pulmonary venous congestion without overt edema. The cardiomediastinal silhouette is stable with cardiomegaly. IMPRESSION: Mild pulmonary venous congestion. Tiny left effusion with underlying opacity in left base, stable. No other change. Electronically Signed   By: Dorise Bullion III M.D   On: 11/25/2018 13:02   Dg Chest Port 1 View  Result Date: 11/11/2018 CLINICAL DATA:  Fever EXAM: PORTABLE CHEST 1 VIEW COMPARISON:  None. FINDINGS: Mild cardiomegaly. No focal consolidation or effusion. No pneumothorax. IMPRESSION: No active disease. Electronically Signed   By: Donavan Foil M.D.   On: 11/11/2018 21:59   Ct Bone Marrow Biopsy & Aspiration  Result Date: 11/15/2018 INDICATION: Pancytopenia, concern for lymphoproliferative process EXAM: CT GUIDED RIGHT ILIAC BONE MARROW ASPIRATION AND CORE BIOPSY Date:   11/15/2018 11/15/2018 10:13 am Radiologist:  M. Daryll Brod, MD Guidance:  CT FLUOROSCOPY TIME:  Fluoroscopy Time: None. MEDICATIONS: 1% lidocaine local ANESTHESIA/SEDATION: 2.0 mg IV Versed; 50 mcg IV Fentanyl Moderate Sedation Time:  10 minutes The patient was continuously monitored during the procedure by the interventional radiology nurse under my direct supervision. CONTRAST:  None. COMPLICATIONS: None PROCEDURE: Informed consent was obtained from the patient following explanation of the procedure, risks, benefits and alternatives. The patient understands, agrees and consents for the procedure. All questions were addressed. A time out was performed. The patient was positioned prone and non-contrast localization CT was performed of the pelvis to demonstrate the iliac marrow spaces. Maximal barrier sterile technique utilized including caps, mask, sterile gowns, sterile gloves, large sterile drape, hand hygiene, and Betadine prep. Under sterile conditions and local anesthesia, an 11 gauge coaxial bone biopsy needle was advanced into the right iliac marrow space. Needle position was confirmed with CT imaging. Initially, bone marrow aspiration was performed. Next, the 11 gauge  outer cannula was utilized to obtain a right iliac bone marrow core biopsy. Needle was removed. Hemostasis was obtained with compression. The patient tolerated the procedure well. Samples were prepared with the cytotechnologist. No immediate complications. IMPRESSION: CT guided right iliac bone marrow aspiration and core biopsy. Electronically Signed   By: Jerilynn Mages.  Shick M.D.   On: 11/15/2018 11:25   Ir Imaging Guided Port Insertion  Result Date: 11/18/2018 INDICATION: 53 year old female with history lymphoma EXAM: IMPLANTED PORT A CATH PLACEMENT WITH ULTRASOUND AND FLUOROSCOPIC GUIDANCE MEDICATIONS: 2.0 g Ancef; The antibiotic was administered within an appropriate time interval prior to skin puncture. ANESTHESIA/SEDATION: Moderate (conscious)  sedation was employed during this procedure. A total of Versed 2.0 mg and Fentanyl 100 mcg was administered intravenously. Moderate Sedation Time: 18 minutes. The patient's level of consciousness and vital signs were monitored continuously by radiology nursing throughout the procedure under my direct supervision. FLUOROSCOPY TIME:  0 minutes, 12 seconds (1.0 mGy) COMPLICATIONS: None PROCEDURE: The procedure, risks, benefits, and alternatives were explained to the patient. Questions regarding the procedure were encouraged and answered. The patient understands and consents to the procedure. Ultrasound survey was performed with images stored and sent to PACs. The right neck and chest was prepped with chlorhexidine, and draped in the usual sterile fashion using maximum barrier technique (cap and mask, sterile gown, sterile gloves, large sterile sheet, hand hygiene and cutaneous antiseptic). Antibiotic prophylaxis was provided with 2.0g Ancef administered IV one hour prior to skin incision. Local anesthesia was attained by infiltration with 1% lidocaine without epinephrine. Ultrasound demonstrated patency of the right internal jugular vein, and this was documented with an image. Under real-time ultrasound guidance, this vein was accessed with a 21 gauge micropuncture needle and image documentation was performed. A small dermatotomy was made at the access site with an 11 scalpel. A 0.018" wire was advanced into the SVC and used to estimate the length of the internal catheter. The access needle exchanged for a 14F micropuncture vascular sheath. The 0.018" wire was then removed and a 0.035" wire advanced into the IVC. An appropriate location for the subcutaneous reservoir was selected below the clavicle and an incision was made through the skin and underlying soft tissues. The subcutaneous tissues were then dissected using a combination of blunt and sharp surgical technique and a pocket was formed. A single lumen power  injectable portacatheter was then tunneled through the subcutaneous tissues from the pocket to the dermatotomy and the port reservoir placed within the subcutaneous pocket. The venous access site was then serially dilated and a peel away vascular sheath placed over the wire. The wire was removed and the port catheter advanced into position under fluoroscopic guidance. The catheter tip is positioned in the cavoatrial junction. This was documented with a spot image. The portacatheter was then tested and found to flush and aspirate well. The port was flushed with saline followed by 100 units/mL heparinized saline. The pocket was then closed in two layers using first subdermal inverted interrupted absorbable sutures followed by a running subcuticular suture. The epidermis was then sealed with Dermabond. The dermatotomy at the venous access site was also seal with Dermabond. Patient tolerated the procedure well and remained hemodynamically stable throughout. No complications encountered and no significant blood loss encountered IMPRESSION: Status post right IJ port catheter placement. Catheter ready for use. Signed, Dulcy Fanny. Dellia Nims, RPVI [facility] Radiology Electronically Signed   By: Corrie Mckusick D.O.   On: 11/18/2018 11:23  US Abdomen Limited Ruq  Result Date: 11/29/2018 CLINICAL DATA:  Elevated total bilirubin = 5.2 EXAM: ULTRASOUND ABDOMEN LIMITED RIGHT UPPER QUADRANT COMPARISON:  CT abdomen and pelvis 11/27/2018 FINDINGS: Gallbladder: Normally distended without stones or wall thickening. No pericholecystic fluid or sonographic Murphy sign. Common bile duct: Diameter: 3 mm diameter, normal Liver: Normal hepatic echogenicity. No mass or nodularity. No intrahepatic biliary dilatation. Portal vein is patent on color Doppler imaging with normal direction of blood flow towards the liver. No RIGHT upper quadrant free fluid. IMPRESSION: Normal exam. Electronically  Signed   By: Lavonia Dana M.D.   On: 11/29/2018 14:34     ASSESSMENT/PLAN:  Stage IV Hodgkin lymphoma -Port placed in 10/2018; TTE showed normal LVEF -The patient received day 1 of cycle 1 ofAVD + Adcetris on 11/19/2018.   Has been on Granix since 11/20/2018.   ANC is now 2.4 and will discontinue Granix. -Recommend PRN anti-emetics, including Zofran and Compazine -CT scan of the abdomen pelvis showed the intra-abdominal lymph nodes appear to be improving.  Spleen is decreased in size. -Next dose of chemo due 12/03/2018 but we will delay the treatment until clinical improvement. Date TBD.  Normocytic anemia -Likely secondary to anemia of chronic disease and underlying lymphoma -Hemoglobinis  8.5.  -Supportive transfusion to keep Hgb > 7. Blood products to be irradiated.   Thrombocytopenia -Likely secondary to underlying lymphomaand recent chemotherapy. -Platelets 135,000. No transfusion indicated today.  -Daily CBC -Goal plts >10k in the absence of bleeding. Blood products to be irradiated.    Neutropenic fever - Resolved -Secondary to underlying lymphoma, bacteremia and C. diff colitis  -Patient had + blood cultures (E Coli and Group B Strep). Repeat cultures negative to date.  -On Ceftriaxone.  To complete 10 days worth of ceftriaxone per ID. -Fungal culture negative. -Appreciate assistance from infectious disease.  Severe protein malnutrition -Patient has lost > 20 lbs since the onset of her symptoms, and her most recent albumin was 2.0 with anasarca, consistent with severe protein malnutrition -Dietitian is following. -If her nutrition remains very poor and she is unable to increase her PO intake, we will have to consider enteral nutrition, such as PEG tube, to help improve her nutritional intake  Diarrhea with abdominal discomfort - Resolved -Stool for C. diff positive.  -Continue oral vancomycin for 14 days per ID. -CT the abdomen pelvis did not show any concerning  findings in the left lower quadrant of her abdomen. -Abdominal pain has resolved.  Stools are now formed.   -Continue pain meds as needed.  Electrolyte abnormalities -Electrolyte abnormalities  overall improved. Replete per primary team.  Hyperbilirubinemia -Unclear etiology.  CT scan and abdominal U/S did not show any focal liver abnormality or obstruction. Possibly reactive.  -T Bili down to 3.8 this morning.  -GI following, appreciate their assistance   Urinary retention -Nursing is checking a bladder scan periodically. -Foley catheter is being placed today.  Urology consult has been recommended.  Deconditioning -Working with PT.  -Will likely need SNF upon D/C.  Recommend social work consult for insurance issues.  Vaginal bleeding -Patient is postmenopausal.  Etiology unclear.  Her platelet count is 135,000 today. -Await results of pelvic ultrasound.  Left breast redness and swelling -Unclear etiology.  Afebrile. -Await results of left breast ultrasound.  Patient's daughter, Anderson Malta, [phone], requests periodic updates.  Updated on 12/02/2018.  Will attempt to call the daughter later today.   LOS: 23 days   Mikey Bussing, DNP, AGPCNP-BC, AOCNP 12/04/18

## 2018-12-04 NOTE — Plan of Care (Addendum)
Pt stable though remains weak and tired overall. No needs at this time. Pt able to drink fluids well this pm when offered by rn. She was also able to void on her on on the bsc. Bladder scan only showed 296.Rn also noticed that pt left breast is swollen and red. Rn will continue to monitor.

## 2018-12-04 NOTE — Progress Notes (Signed)
   12/04/18 1518  Clinical Encounter Type  Visited With Patient  Visit Type Initial  Referral From Chaplain  Consult/Referral To Chaplain  The chaplain attempted initial spiritual care visit for PMT Pt. The chaplain called the Pt.'s name with no response from the Pt. because she was sleeping.  The chaplain will F/U with spiritual care as needed.

## 2018-12-04 NOTE — Progress Notes (Addendum)
PROGRESS NOTE  Cindy Ward GYF:[id number] DOB: Apr 16, 1966 DOA: 11/11/2018 PCP: [name], MD   LOS: 23 days   Brief narrative:  53 year old female with history of essential hypertension, hypothyroidism initially presented to [facility] with complaints of recurrent nausea, vomiting, fever and night sweats.  Upon extensive work-up she was diagnosed with Hodgkin's lymphoma, stage IV.  Oncology team was consulted and patient was transferred here for further care and management.  Lymph node biopsy was consistent with classic Hodgkin's lymphoma.  Hospital course has been complicated by bacteremia, intermittent encephalopathy, severe protein calorie malnutrition and electrolyte imbalance.  She is also been pancytopenic since she is on chemotherapy receiving Granix.  Pulmonary and oncology team has been following.  Subjective: Nursing staff reported some vaginal bleeding with the left breast erythema.  Patient denies any diarrhea over the last 2 days but has poor appetite and difficulty swallowing.  She complains of generalized weakness.  Nursing staff also reported urinary retention intermittently and needed in and out catheterization.  Assessment/Plan:  Principal Problem:   Hodgkin lymphoma (HCC) Active Problems:   Lymphadenopathy   Fever   Splenomegaly   Liver enzyme elevation   Thrombocytopenia (HCC)   Unintentional weight loss   Hashimoto's thyroiditis   Goiter   Hypertension   Aortic atherosclerosis (HCC)   Pancytopenia (HCC)   Malnutrition of moderate degree   Normocytic anemia   Neutropenic fever (HCC)   Oral thrush   Enteritis due to Clostridium difficile   Streptococcal bacteremia   Palliative care by specialist   Goals of care, counseling/discussion   Total bilirubin, elevated  Stage IV Hodgkin's lymphoma, new diagnosis -  Scan of the abdomen and pelvis reviewed.  Patient underwent port placement on 2/24 and chemotherapy on 2/25.    Plans for second round of  chemotherapy outpatient when patient is much stronger and total bilirubin is at least below 2.0 per Dr Maylon Peppers, oncology. Appreciate Palliative care input.   Patient will benefit from palliative care consultation as outpatient.  Left breast erythema without induration.  No nipple drainage.  Unsure etiology.  Will closely monitor.  Possible lymphatic obstruction.. Obtain limited ultrasound of the left breast to rule out abscess.  Hypokalemia Continue replacement.  Continue IV potassium runs.  Check BMP in a.m.  Pancytopenia with mild rectal/vaginal bleeding. Anemia of chronic disease, Hb 8.8 yesterday and hemoglobin of 8.5 today.  Will closely monitor. Platelet count is 135 today.  We will continue to monitor closely.  Check ultrasound of the pelvis to rule out vaginal bleeding.  WBC of 4.1 today.  Sinus tachycardia; greatly improved with hydration and beta-blockers.  Bacteremia with E. coli and group B strep with neutropenia C. difficile colitis, slowly improving Continue Rocephin for total of 10 days.  Today is day 9/10.  Oral vancomycin 7/14 as per infectious disease recommendation.  Repeat blood culture from 11/28/2018 is negative so far.  Elevated Total Bilirubin; possible due to hepatotoxic meds -CT abdomen pelvis was negative for any acute pathology including right upper quadrant ultrasound.  GI on board.  Will trend CMP.  Acute metabolic encephalopathy/delirium; improved.  Visual hallucination; resolved.   Severe protein calorie malnutrition -Encourage oral diet as tolerated.  Nutrition following.  Acute urinary retention with bilateral hydroureteronephrosis -Foley catheter removed but patient is having intermittent urinary retention.  If patient continues to retain may consider Foley catheter placement but due to history of neutropenia will try to avoid..   Mild dysphagia with oral thrush - Encourage p.o. intake.  Nutrition consultation. - Nystatin to continue.  Patient  recently received Diflucan which has been discontinued.  Hypothyroidism -Continue Synthroid  Generalized debility - PT on board.  Recommended skilled nursing facility placement.  Patient does not have payor source for rehabilitation so will likely need to be discharged home with home health.  I have encouraged the patient to ambulate.  Physical therapy to continue to ambulate the patient.  Spoke with the nursing staff and case management.  VTE Prophylaxis: SCD  Code Status: Full code  Family Communication: No one is available at bedside  Disposition Plan: PT recommending skilled nursing facility.  Likely disposition home in 1 to 2 days.  Patient will need more ambulation.  Spoke with the nursing staff about it.  Check ultrasound of the breast and uterus.  Try to avoid indwelling catheter.  If continues to retain urine, will likely need foley catheter  again.   Consultants:  Pulmonary, oncology, GI  Procedures:  Bx 2/21  Port cath 2/24  Antibiotics: Anti-infectives (From admission, onward)   Start     Dose/Rate Route Frequency Ordered Stop   11/27/18 1130  vancomycin (VANCOCIN) 50 mg/mL oral solution 125 mg     125 mg Oral 4 times daily 11/27/18 1051 12/10/18 2359   11/27/18 0915  metroNIDAZOLE (FLAGYL) IVPB 500 mg  Status:  Discontinued     500 mg 100 mL/hr over 60 Minutes Intravenous Every 8 hours 11/27/18 0912 11/27/18 1051   11/26/18 0200  cefTRIAXone (ROCEPHIN) 2 g in sodium chloride 0.9 % 100 mL IVPB     2 g 200 mL/hr over 30 Minutes Intravenous Daily at bedtime 11/26/18 0059 12/05/18 2359   11/25/18 1200  fluconazole (DIFLUCAN) IVPB 200 mg  Status:  Discontinued     200 mg 100 mL/hr over 60 Minutes Intravenous Every 24 hours 11/25/18 0836 12/02/18 1255   11/25/18 1000  vancomycin (VANCOCIN) IVPB 1000 mg/200 mL premix  Status:  Discontinued     1,000 mg 200 mL/hr over 60 Minutes Intravenous Every 12 hours 11/25/18 0848 11/26/18 0827   11/25/18 1000  ceFEPIme  (MAXIPIME) 2 g in sodium chloride 0.9 % 100 mL IVPB  Status:  Discontinued     2 g 200 mL/hr over 30 Minutes Intravenous Every 8 hours 11/25/18 0848 11/26/18 0058   11/25/18 0900  vancomycin (VANCOCIN) 1,500 mg in sodium chloride 0.9 % 500 mL IVPB     1,500 mg 250 mL/hr over 120 Minutes Intravenous  Once 11/25/18 0848 11/25/18 1135   11/24/18 1500  fluconazole (DIFLUCAN) tablet 200 mg  Status:  Discontinued     200 mg Oral Daily 11/24/18 1450 11/25/18 0836   11/18/18 0600  ceFAZolin (ANCEF) IVPB 2g/100 mL premix     2 g 200 mL/hr over 30 Minutes Intravenous To Radiology 11/17/18 1243 11/18/18 1130   11/12/18 0800  cefTRIAXone (ROCEPHIN) 2 g in sodium chloride 0.9 % 100 mL IVPB  Status:  Discontinued     2 g 200 mL/hr over 30 Minutes Intravenous Every 24 hours 11/11/18 1711 11/12/18 1057       Objective: Vitals:   12/04/18 0043 12/04/18 0536  BP: 96/79 119/61  Pulse: 91 92  Resp: 20 20  Temp: 98.5 F (36.9 C) 97.9 F (36.6 C)  SpO2: 95% 93%    Intake/Output Summary (Last 24 hours) at 12/04/2018 1335 Last data filed at 12/04/2018 1218 Gross per 24 hour  Intake 961.61 ml  Output 2150 ml  Net -1188.39 ml   Danley Danker  Weights   12/01/18 0343 12/02/18 0513 12/04/18 0421  Weight: 65.8 kg 64.9 kg 65 kg   Body mass index is 21.79 kg/m.   Physical Exam: GENERAL: Patient is alert awake and communicative.  Not in obvious distress.  Thinly built. HENT: No scleral pallor or icterus. Pupils equally reactive to light. Oral mucosa is moist NECK: is supple, no palpable thyroid enlargement. CHEST: Coarse breath sounds bilaterally.  Left breast with mild erythema without induration. CVS: S1 and S2 heard, no murmur. Regular rate and rhythm. No pericardial rub. ABDOMEN: Soft, non-tender, bowel sounds are present. No palpable hepato-splenomegaly.  Vaginal/rectal bleed which has dried up. EXTREMITIES: No edema. CNS: Cranial nerves are intact. No focal motor or sensory deficits. SKIN: warm and  dry without rashes.  Data Review: I have personally reviewed the following laboratory data and studies,  CBC: Recent Labs  Lab 11/30/18 0345 11/30/18 2356 12/01/18 0404 12/02/18 0324 12/03/18 0343 12/04/18 0411  WBC 0.4*  --  0.6* 0.9* 2.0* 4.1  NEUTROABS  --   --  0.2* 0.4* 0.6* 2.4  HGB 7.2* 7.8* 8.1* 7.3* 8.8* 8.5*  HCT 23.3* 25.4* 26.0* 24.1* 27.5* 27.5*  MCV 90.3  --  90.3 90.9 89.3 91.1  PLT 38*  --  61* 91* 107* 185*   Basic Metabolic Panel: Recent Labs  Lab 11/28/18 0440  11/29/18 0500 11/30/18 0345 12/01/18 0404 12/02/18 0324 12/03/18 0343 12/03/18 1501 12/04/18 0411  NA 144   < > 143 141 143 147* 147* 145 147*  K 3.3*   < > 3.5 3.3* 3.5 3.4* 2.9* 3.1* 3.1*  CL 110   < > 109 112* 113* 116* 111 111 108  CO2 25   < > 25 22 23 25 26 25 27   GLUCOSE 141*   < > 159* 120* 98 95 94 79 80  BUN 7   < > 9 8 8 10 11 10 12   CREATININE 0.58   < > 0.61 0.49 0.47 0.50 0.57 0.54 0.56  CALCIUM 4.5*   < > 5.6* 6.4* 7.5* 8.0* 8.6* 8.0* 9.0  MG 2.5*  --  2.2 2.2 1.9 1.8 1.7  --  1.8  PHOS 2.0*  --  1.7* 2.9 2.9 3.2  --   --   --    < > = values in this interval not displayed.   Liver Function Tests: Recent Labs  Lab 11/30/18 0345 12/01/18 0404 12/02/18 0324 12/03/18 0343 12/04/18 0411  AST 39 50* 48* 41 45*  ALT 56* 64* 65* 66* 62*  ALKPHOS 91 97 99 132* 363*  BILITOT 5.6* 6.7* 5.9* 4.2* 3.8*  PROT 4.6* 4.7* 4.6* 4.8* 4.8*  ALBUMIN 1.7* 1.6* 1.6* 1.8* 1.7*   No results for input(s): LIPASE, AMYLASE in the last 168 hours. No results for input(s): AMMONIA in the last 168 hours. Cardiac Enzymes: No results for input(s): CKTOTAL, CKMB, CKMBINDEX, TROPONINI in the last 168 hours. BNP (last 3 results) Recent Labs    11/21/18 1519  BNP 1,731.6*    ProBNP (last 3 results) No results for input(s): PROBNP in the last 8760 hours.  CBG: No results for input(s): GLUCAP in the last 168 hours. Recent Results (from the past 240 hour(s))  Culture, blood (routine x 2)      Status: Abnormal   Collection Time: 11/25/18  9:08 AM  Result Value Ref Range Status   Specimen Description   Final    BLOOD RIGHT HAND Performed at [facility]  [address], [place], [place] [address]    Special Requests   Final    BAA BCAV Performed at [facility] [address], [place], [place] [address]    Culture  Setup Time   Final    GRAM POSITIVE COCCI GRAM NEGATIVE RODS IN BOTH AEROBIC AND ANAEROBIC BOTTLES CRITICAL VALUE NOTED.  VALUE IS CONSISTENT WITH PREVIOUSLY REPORTED AND CALLED VALUE.    Culture (A)  Final    ESCHERICHIA COLI GROUP B STREP(S.AGALACTIAE)ISOLATED SUSCEPTIBILITIES PERFORMED ON PREVIOUS CULTURE WITHIN THE LAST 5 DAYS. Performed at [facility], [place] [address], [place], [place] [address]    Report Status 11/28/2018 FINAL  Final  Culture, blood (routine x 2)     Status: Abnormal   Collection Time: 11/25/18  9:08 AM  Result Value Ref Range Status   Specimen Description   Final    BLOOD LEFT ARM Performed at [facility] [address], [place], [place] [address]    Special Requests   Final    BAA BCAV Performed at [facility] [address], [place], [place] [address]    Culture  Setup Time   Final    GRAM POSITIVE COCCI GRAM NEGATIVE RODS IN BOTH AEROBIC AND ANAEROBIC BOTTLES CRITICAL RESULT CALLED TO, READ BACK BY AND VERIFIED WITH: E GREEN PHARMD 11/26/18 0025 JDW Performed at [facility], [place] [address], [place], [place] [address]    Culture (A)  Final    ESCHERICHIA COLI GROUP B STREP(S.AGALACTIAE)ISOLATED    Report Status 11/28/2018 FINAL  Final   Organism ID, Bacteria ESCHERICHIA COLI  Final   Organism ID, Bacteria GROUP B STREP(S.AGALACTIAE)ISOLATED  Final      Susceptibility   Escherichia coli - MIC*    AMPICILLIN >=32 RESISTANT Resistant     CEFAZOLIN <=4 SENSITIVE Sensitive     CEFEPIME <=1 SENSITIVE Sensitive     CEFTAZIDIME <=1  SENSITIVE Sensitive     CEFTRIAXONE <=1 SENSITIVE Sensitive     CIPROFLOXACIN >=4 RESISTANT Resistant     GENTAMICIN <=1 SENSITIVE Sensitive     IMIPENEM <=0.25 SENSITIVE Sensitive     TRIMETH/SULFA <=20 SENSITIVE Sensitive     AMPICILLIN/SULBACTAM >=32 RESISTANT Resistant     PIP/TAZO <=4 SENSITIVE Sensitive     Extended ESBL NEGATIVE Sensitive     * ESCHERICHIA COLI   Group b strep(s.agalactiae)isolated - MIC*    CLINDAMYCIN >=1 RESISTANT Resistant     AMPICILLIN <=0.25 SENSITIVE Sensitive     ERYTHROMYCIN >=8 RESISTANT Resistant     VANCOMYCIN 0.5 SENSITIVE Sensitive     CEFTRIAXONE <=0.12 SENSITIVE Sensitive     LEVOFLOXACIN 1 SENSITIVE Sensitive     PENICILLIN SENSITIVE Sensitive     * GROUP B STREP(S.AGALACTIAE)ISOLATED  Blood Culture ID Panel (Reflexed)     Status: Abnormal   Collection Time: 11/25/18  9:08 AM  Result Value Ref Range Status   Enterococcus species NOT DETECTED NOT DETECTED Final   Listeria monocytogenes NOT DETECTED NOT DETECTED Final   Staphylococcus species NOT DETECTED NOT DETECTED Final   Staphylococcus aureus (BCID) NOT DETECTED NOT DETECTED Final   Streptococcus species DETECTED (A) NOT DETECTED Final    Comment: CRITICAL RESULT CALLED TO, READ BACK BY AND VERIFIED WITH: E GREEN PHARMD 11/26/18 0025 JDW    Streptococcus agalactiae DETECTED (A) NOT DETECTED Final    Comment: CRITICAL RESULT CALLED TO, READ BACK BY AND VERIFIED WITH: E GREEN PHARMD 11/26/18 0025 JDW  Streptococcus pneumoniae NOT DETECTED NOT DETECTED Final   Streptococcus pyogenes NOT DETECTED NOT DETECTED Final   Acinetobacter baumannii NOT DETECTED NOT DETECTED Final   Enterobacteriaceae species DETECTED (A) NOT DETECTED Final    Comment: Enterobacteriaceae represent a large family of gram-negative bacteria, not a single organism. CRITICAL RESULT CALLED TO, READ BACK BY AND VERIFIED WITH: E GREEN PHARMD 11/26/18 0025 JDW    Enterobacter cloacae complex NOT DETECTED NOT DETECTED Final    Escherichia coli DETECTED (A) NOT DETECTED Final    Comment: CRITICAL RESULT CALLED TO, READ BACK BY AND VERIFIED WITH: E GREEN PHARMD 11/26/18 0025 JDW    Klebsiella oxytoca NOT DETECTED NOT DETECTED Final   Klebsiella pneumoniae NOT DETECTED NOT DETECTED Final   Proteus species NOT DETECTED NOT DETECTED Final   Serratia marcescens NOT DETECTED NOT DETECTED Final   Carbapenem resistance NOT DETECTED NOT DETECTED Final   Haemophilus influenzae NOT DETECTED NOT DETECTED Final   Neisseria meningitidis NOT DETECTED NOT DETECTED Final   Pseudomonas aeruginosa NOT DETECTED NOT DETECTED Final   Candida albicans NOT DETECTED NOT DETECTED Final   Candida glabrata NOT DETECTED NOT DETECTED Final   Candida krusei NOT DETECTED NOT DETECTED Final   Candida parapsilosis NOT DETECTED NOT DETECTED Final   Candida tropicalis NOT DETECTED NOT DETECTED Final    Comment: Performed at [facility], [place] [address] [place], [place] [address]  Fungus culture, blood     Status: None   Collection Time: 11/25/18  1:26 PM  Result Value Ref Range Status   Specimen Description   Final    BLOOD Performed at [facility], [place] [address] [place], [place] [address]    Special Requests   Final    NONE Performed at [facility], [place] [address] [place], [place] [address]    Culture   Final    NO GROWTH 7 DAYS NO FUNGUS ISOLATED Performed at [facility], [place] [address], [place], [place] [address]    Report Status 12/02/2018 FINAL  Final  C difficile quick scan w PCR reflex     Status: Abnormal   Collection Time: 11/27/18  8:00 AM  Result Value Ref Range Status   C Diff antigen POSITIVE (A) NEGATIVE Final   C Diff toxin NEGATIVE NEGATIVE Final   C Diff interpretation Results are indeterminate. See PCR results.  Final    Comment: Performed at [facility], [place] [address] [place], [place] [address]  C. Diff by PCR, Reflexed     Status: Abnormal    Collection Time: 11/27/18  8:00 AM  Result Value Ref Range Status   Toxigenic C. Difficile by PCR POSITIVE (A) NEGATIVE Final    Comment: Positive for toxigenic C. difficile with little to no toxin production. Only treat if clinical presentation suggests symptomatic illness. Performed at [facility], [place] [address] [place], [place] [address]   Culture, blood (Routine X 2) w Reflex to ID Panel     Status: None   Collection Time: 11/28/18 12:19 AM  Result Value Ref Range Status   Specimen Description   Final    BLOOD RIGHT HAND Performed at [facility] [address], [place], [place] [address]    Special Requests   Final    BOTTLES DRAWN AEROBIC AND ANAEROBIC Blood Culture adequate volume Performed at [facility] [address] [place], [place] [address]    Culture   Final  NO GROWTH 5 DAYS Performed at [facility], [place] [address], [place], [place] [address]    Report Status 12/03/2018 FINAL  Final  Culture, blood (Routine X 2) w Reflex to ID Panel     Status: None   Collection Time: 11/28/18 12:19 AM  Result Value Ref Range Status   Specimen Description   Final    BLOOD LEFT HAND Performed at [facility] [address] [place], [place] [address]    Special Requests   Final    BOTTLES DRAWN AEROBIC AND ANAEROBIC Blood Culture adequate volume Performed at [facility] [address] [place], [place] [address]    Culture   Final    NO GROWTH 5 DAYS Performed at [facility], [place] [address], [place], [place] [address]    Report Status 12/03/2018 FINAL  Final     Studies: No results found.  Scheduled Meds: . Chlorhexidine Gluconate Cloth  6 each Topical Daily  . levothyroxine  37.5 mcg Intravenous Daily  . mouth rinse  15 mL Mouth Rinse BID  . multivitamin with minerals  1 tablet Oral Daily  . nebivolol  10 mg Oral Daily  . nystatin  5 mL Oral QID  . scopolamine  1  patch Transdermal Q72H  . vancomycin  125 mg Oral QID    Continuous Infusions: . sodium chloride 1,000 mL (11/30/18 1420)  . sodium chloride    . cefTRIAXone (ROCEPHIN)  IV Stopped (12/03/18 2124)  . potassium chloride 10 mEq (12/04/18 1158)     Flora Lipps, MD  [facility] 12/04/2018

## 2018-12-04 NOTE — Plan of Care (Signed)
  Problem: Health Behavior/Discharge Planning: Goal: Ability to manage health-related needs will improve Outcome: Progressing   Problem: Education: Goal: Knowledge of General Education information will improve Description: Including pain rating scale, medication(s)/side effects and non-pharmacologic comfort measures Outcome: Progressing   Problem: Activity: Goal: Risk for activity intolerance will decrease Outcome: Progressing   Problem: Safety: Goal: Ability to remain free from injury will improve Outcome: Progressing   

## 2018-12-05 ENCOUNTER — Inpatient Hospital Stay (HOSPITAL_COMMUNITY): Payer: Medicaid Other

## 2018-12-05 LAB — CBC WITH DIFFERENTIAL/PLATELET
Abs Immature Granulocytes: 0.1 10*3/uL — ABNORMAL HIGH (ref 0.00–0.07)
BASOS ABS: 0 10*3/uL (ref 0.0–0.1)
Band Neutrophils: 12 %
Basophils Relative: 0 %
Eosinophils Absolute: 0 10*3/uL (ref 0.0–0.5)
Eosinophils Relative: 0 %
HCT: 25.9 % — ABNORMAL LOW (ref 36.0–46.0)
Hemoglobin: 8.3 g/dL — ABNORMAL LOW (ref 12.0–15.0)
Lymphocytes Relative: 12 %
Lymphs Abs: 0.9 10*3/uL (ref 0.7–4.0)
MCH: 29 pg (ref 26.0–34.0)
MCHC: 32 g/dL (ref 30.0–36.0)
MCV: 90.6 fL (ref 80.0–100.0)
Metamyelocytes Relative: 1 %
Monocytes Absolute: 1 10*3/uL (ref 0.1–1.0)
Monocytes Relative: 13 %
NEUTROS PCT: 62 %
Neutro Abs: 5.6 10*3/uL (ref 1.7–7.7)
PLATELETS: 181 10*3/uL (ref 150–400)
RBC: 2.86 MIL/uL — AB (ref 3.87–5.11)
RDW: 17 % — ABNORMAL HIGH (ref 11.5–15.5)
WBC: 7.5 10*3/uL (ref 4.0–10.5)
nRBC: 1.3 % — ABNORMAL HIGH (ref 0.0–0.2)

## 2018-12-05 LAB — COMPREHENSIVE METABOLIC PANEL
ALT: 63 U/L — ABNORMAL HIGH (ref 0–44)
AST: 60 U/L — ABNORMAL HIGH (ref 15–41)
Albumin: 1.8 g/dL — ABNORMAL LOW (ref 3.5–5.0)
Alkaline Phosphatase: 710 U/L — ABNORMAL HIGH (ref 38–126)
Anion gap: 14 (ref 5–15)
BUN: 11 mg/dL (ref 6–20)
CO2: 29 mmol/L (ref 22–32)
Calcium: 9.5 mg/dL (ref 8.9–10.3)
Chloride: 102 mmol/L (ref 98–111)
Creatinine, Ser: 0.6 mg/dL (ref 0.44–1.00)
GFR calc non Af Amer: >60 mL/min (ref >60)
Glucose, Bld: 81 mg/dL (ref 70–99)
Potassium: 3 mmol/L — ABNORMAL LOW (ref 3.5–5.1)
Sodium: 145 mmol/L (ref 135–145)
Total Bilirubin: 3.5 mg/dL — ABNORMAL HIGH (ref 0.3–1.2)
Total Protein: 5 g/dL — ABNORMAL LOW (ref 6.5–8.1)

## 2018-12-05 LAB — AMMONIA: Ammonia: 10 umol/L (ref 9–35)

## 2018-12-05 MED ORDER — POTASSIUM CHLORIDE 10 MEQ/100ML IV SOLN
10.0000 meq | INTRAVENOUS | Status: AC
  Administered 2018-12-05: 10 meq via INTRAVENOUS
  Filled 2018-12-05: qty 100

## 2018-12-05 MED ORDER — OXYCODONE HCL 5 MG PO TABS: 5.0000 mg | ORAL_TABLET | ORAL | Status: DC | PRN

## 2018-12-05 MED ORDER — HYDROMORPHONE HCL 1 MG/ML IJ SOLN: 0.5000 mg | Freq: Four times a day (QID) | INTRAMUSCULAR | Status: DC | PRN

## 2018-12-05 MED ORDER — POTASSIUM CHLORIDE 10 MEQ/100ML IV SOLN
10.0000 meq | INTRAVENOUS | Status: AC
  Administered 2018-12-05 (×4): 10 meq via INTRAVENOUS
  Filled 2018-12-05 (×4): qty 100

## 2018-12-05 MED ORDER — POTASSIUM CHLORIDE 10 MEQ/100ML IV SOLN: 10.0000 meq | INTRAVENOUS | Status: DC

## 2018-12-05 NOTE — Progress Notes (Signed)
Patient has refused all meds/nutrition today. L.Pokhrel notified.

## 2018-12-05 NOTE — Progress Notes (Signed)
Attempted to insert 30f foley catheter into patient, urethra is well visualized however, could not get 30f insert. 95f catheter was inserted via sterile technique, patient tolerated procedure well, however did not follow any commands and NT assisting had to continue to redirect patient and hold legs in place.

## 2018-12-05 NOTE — Progress Notes (Signed)
PROGRESS NOTE  Cindy Ward ASN:[id number] DOB: 1966/07/27 DOA: 11/11/2018 PCP: [name], MD   LOS: 24 days   Brief narrative:  53 year old female with history of essential hypertension, hypothyroidism initially presented to [facility] with complaints of recurrent nausea, vomiting, fever and night sweats.  Upon extensive work-up she was diagnosed with Hodgkin's lymphoma, stage IV.  Oncology team was consulted and patient was transferred here for further care and management.  Lymph node biopsy was consistent with classic Hodgkin's lymphoma.  Hospital course has been complicated by bacteremia, intermittent encephalopathy, severe protein calorie malnutrition and electrolyte imbalance.  She is also been pancytopenic since she is on chemotherapy received Granix.  Pulmonary and oncology team has been following.  Subjective: Patient staff reports poor appetite and difficulty swallowing.  Patient is little more confused today and not following commands. No report of loose bowel movements today. Assessment/Plan:  Principal Problem:   Hodgkin lymphoma (HCC) Active Problems:   Lymphadenopathy   Fever   Splenomegaly   Liver enzyme elevation   Thrombocytopenia (HCC)   Unintentional weight loss   Hashimoto's thyroiditis   Goiter   Hypertension   Aortic atherosclerosis (HCC)   Pancytopenia (HCC)   Malnutrition of moderate degree   Normocytic anemia   Neutropenic fever (HCC)   Oral thrush   Enteritis due to Clostridium difficile   Streptococcal bacteremia   Palliative care by specialist   Goals of care, counseling/discussion   Total bilirubin, elevated  Stage IV Hodgkin's lymphoma, new diagnosis -  CT Scan of the abdomen and pelvis reviewed.  Patient underwent port placement on 2/24 and chemotherapy on 2/25.    Plans for second round of chemotherapy outpatient when patient is much stronger and total bilirubin is at least below 2.0 per Dr Maylon Peppers, oncology.   Only her bilirubin is  high and her overall general condition has not improved.  Family has made efforts to discuss with the patient regarding goals of care but she has not participated in the discussion.  Patient will benefit from palliative care consultation as outpatient.  Spoke with oncology at bedside today.  Left breast erythema without induration.  No nipple drainage.  Unsure etiology.  Will closely monitor.  Possible lymphatic obstruction, vasculitis.Marland Kitchen Pending ultrasound of the left breast to rule out abscess.  Hypokalemia Continue replacement.  Continue IV potassium runs.  Check BMP in a.m.  Pancytopenia with mild rectal/vaginal bleeding.  Further bleeding. Anemia of chronic disease, hemoglobin 8.3 today.  Will closely monitor. Platelet count is 185 today.  We will continue to monitor closely.  Pending ultrasound of the pelvis to rule out uterine pathology.  Sinus tachycardia; greatly improved with hydration and beta-blockers.  Bacteremia with E. coli and group B strep with neutropenia C. difficile colitis, slowly improving Continue Rocephin for total of 10 days.  Today is day 10/10.  Oral vancomycin 8/14 as per infectious disease recommendation.  Repeat blood culture from 11/28/2018 is negative so far.  Elevated Total Bilirubin; possible due to hepatotoxic meds -CT abdomen pelvis was negative for any acute pathology including right upper quadrant ultrasound.  GI on board.  We will continue to trend CMP.  Slightly improved bilirubin today.  Acute metabolic encephalopathy/delirium;  Likely hypoactive delirium today.  Will decrease the dose of narcotics closely monitor.  Severe protein calorie malnutrition -Encourage oral diet as tolerated.  Nutrition following.  Patient does have overall poor appetite.  Acute urinary retention with bilateral hydroureteronephrosis Intermittent catheterization.  Urology has been consulted due to risk  of infections from neutropenia.   Mild dysphagia with oral thrush -  Encourage p.o. intake. - Nystatin to continue.  Patient recently received Diflucan which has been discontinued.  Hypothyroidism -Continue Synthroid  Generalized debility - PT on board.  Recommended skilled nursing facility placement but patient does not have payor source for rehabilitation so will likely need to be discharged home with home health.  Medicaid application pending.  I have encouraged the patient to ambulate and increase oral intake.Marland Kitchen  Physical therapy to continue to ambulate the patient.  VTE Prophylaxis: SCD  Code Status: Full code  Family Communication: I had a prolonged discussion with patient's daughter Ms. Garlan Fillers on the phone and updated her about the clinical condition of the patient. I have encouraged her to discuss with her other sister who currently lives in [place], regarding the potential plan for disposition home in the future.  Ms. [name] has a full-time job currently..  Currently, patient lives by herself at home.  Disposition Plan: Difficult disposition at this time.  Social worker is on board.  We will continue to discuss with the family.  Consultants:  Pulmonary, oncology, GI  Procedures:  Bx 2/21  Port cath 2/24  Antibiotics: Anti-infectives (From admission, onward)   Start     Dose/Rate Route Frequency Ordered Stop   11/27/18 1130  vancomycin (VANCOCIN) 50 mg/mL oral solution 125 mg     125 mg Oral 4 times daily 11/27/18 1051 12/10/18 2359   11/27/18 0915  metroNIDAZOLE (FLAGYL) IVPB 500 mg  Status:  Discontinued     500 mg 100 mL/hr over 60 Minutes Intravenous Every 8 hours 11/27/18 0912 11/27/18 1051   11/26/18 0200  cefTRIAXone (ROCEPHIN) 2 g in sodium chloride 0.9 % 100 mL IVPB     2 g 200 mL/hr over 30 Minutes Intravenous Daily at bedtime 11/26/18 0059 12/05/18 2359   11/25/18 1200  fluconazole (DIFLUCAN) IVPB 200 mg  Status:  Discontinued     200 mg 100 mL/hr over 60 Minutes Intravenous Every 24 hours 11/25/18 0836 12/02/18 1255    11/25/18 1000  vancomycin (VANCOCIN) IVPB 1000 mg/200 mL premix  Status:  Discontinued     1,000 mg 200 mL/hr over 60 Minutes Intravenous Every 12 hours 11/25/18 0848 11/26/18 0827   11/25/18 1000  ceFEPIme (MAXIPIME) 2 g in sodium chloride 0.9 % 100 mL IVPB  Status:  Discontinued     2 g 200 mL/hr over 30 Minutes Intravenous Every 8 hours 11/25/18 0848 11/26/18 0058   11/25/18 0900  vancomycin (VANCOCIN) 1,500 mg in sodium chloride 0.9 % 500 mL IVPB     1,500 mg 250 mL/hr over 120 Minutes Intravenous  Once 11/25/18 0848 11/25/18 1135   11/24/18 1500  fluconazole (DIFLUCAN) tablet 200 mg  Status:  Discontinued     200 mg Oral Daily 11/24/18 1450 11/25/18 0836   11/18/18 0600  ceFAZolin (ANCEF) IVPB 2g/100 mL premix     2 g 200 mL/hr over 30 Minutes Intravenous To Radiology 11/17/18 1243 11/18/18 1130   11/12/18 0800  cefTRIAXone (ROCEPHIN) 2 g in sodium chloride 0.9 % 100 mL IVPB  Status:  Discontinued     2 g 200 mL/hr over 30 Minutes Intravenous Every 24 hours 11/11/18 1711 11/12/18 1057       Objective: Vitals:   12/05/18 0840 12/05/18 1122  BP: 128/90 118/71  Pulse: 99 100  Resp: 20 18  Temp: 98.9 F (37.2 C) (!) 97.5 F (36.4 C)  SpO2: 95% 95%  Intake/Output Summary (Last 24 hours) at 12/05/2018 1455 Last data filed at 12/05/2018 1000 Gross per 24 hour  Intake 520 ml  Output 800 ml  Net -280 ml   Filed Weights   12/02/18 0513 12/04/18 0421 12/05/18 0550  Weight: 64.9 kg 65 kg 62 kg   Body mass index is 20.77 kg/m.   Physical Exam: General: Alert awake communicative but slightly confused.  Thinly built.  Not in obvious distress. HENT: Normocephalic, pupils equally reacting to light and accommodation.  Mild pallor noted.  Chest:  Diminished breath sounds bilaterally. No crackles or wheezes.  Left breast erythema. CVS: S1 &S2 heard. No murmur.  Regular rate and rhythm. Abdomen: Soft, nontender, nondistended.  Bowel sounds are heard.  Liver is not palpable, no  abdominal mass palpated Extremities: No cyanosis, clubbing or edema.  Peripheral pulses are palpable. Psych: Alert, awake and oriented, normal mood CNS:  No cranial nerve deficits.  Moves all extremities.  Disoriented.   Skin: Warm and dry.  Left breast with mild erythema.   Data Review: I have personally reviewed the following laboratory data and studies,  CBC: Recent Labs  Lab 12/01/18 0404 12/02/18 0324 12/03/18 0343 12/04/18 0411 12/05/18 0425  WBC 0.6* 0.9* 2.0* 4.1 7.5  NEUTROABS 0.2* 0.4* 0.6* 2.4 5.6  HGB 8.1* 7.3* 8.8* 8.5* 8.3*  HCT 26.0* 24.1* 27.5* 27.5* 25.9*  MCV 90.3 90.9 89.3 91.1 90.6  PLT 61* 91* 107* 135* 950   Basic Metabolic Panel: Recent Labs  Lab 11/29/18 0500 11/30/18 0345 12/01/18 0404 12/02/18 0324 12/03/18 0343 12/03/18 1501 12/04/18 0411 12/05/18 0425  NA 143 141 143 147* 147* 145 147* 145  K 3.5 3.3* 3.5 3.4* 2.9* 3.1* 3.1* 3.0*  CL 109 112* 113* 116* 111 111 108 102  CO2 25 22 23 25 26 25 27 29   GLUCOSE 159* 120* 98 95 94 79 80 81  BUN 9 8 8 10 11 10 12 11   CREATININE 0.61 0.49 0.47 0.50 0.57 0.54 0.56 0.60  CALCIUM 5.6* 6.4* 7.5* 8.0* 8.6* 8.0* 9.0 9.5  MG 2.2 2.2 1.9 1.8 1.7  --  1.8  --   PHOS 1.7* 2.9 2.9 3.2  --   --   --   --    Liver Function Tests: Recent Labs  Lab 12/01/18 0404 12/02/18 0324 12/03/18 0343 12/04/18 0411 12/05/18 0425  AST 50* 48* 41 45* 60*  ALT 64* 65* 66* 62* 63*  ALKPHOS 97 99 132* 363* 710*  BILITOT 6.7* 5.9* 4.2* 3.8* 3.5*  PROT 4.7* 4.6* 4.8* 4.8* 5.0*  ALBUMIN 1.6* 1.6* 1.8* 1.7* 1.8*   No results for input(s): LIPASE, AMYLASE in the last 168 hours. Recent Labs  Lab 12/05/18 1355  AMMONIA 10   Cardiac Enzymes: No results for input(s): CKTOTAL, CKMB, CKMBINDEX, TROPONINI in the last 168 hours. BNP (last 3 results) Recent Labs    11/21/18 1519  BNP 1,731.6*    ProBNP (last 3 results) No results for input(s): PROBNP in the last 8760 hours.  CBG: No results for input(s): GLUCAP in  the last 168 hours. Recent Results (from the past 240 hour(s))  C difficile quick scan w PCR reflex     Status: Abnormal   Collection Time: 11/27/18  8:00 AM  Result Value Ref Range Status   C Diff antigen POSITIVE (A) NEGATIVE Final   C Diff toxin NEGATIVE NEGATIVE Final   C Diff interpretation Results are indeterminate. See PCR results.  Final    Comment: Performed  at [facility], [place] [address], [place], [place] [address]  C. Diff by PCR, Reflexed     Status: Abnormal   Collection Time: 11/27/18  8:00 AM  Result Value Ref Range Status   Toxigenic C. Difficile by PCR POSITIVE (A) NEGATIVE Final    Comment: Positive for toxigenic C. difficile with little to no toxin production. Only treat if clinical presentation suggests symptomatic illness. Performed at [facility], [place] [address] [place], [place] [address]   Culture, blood (Routine X 2) w Reflex to ID Panel     Status: None   Collection Time: 11/28/18 12:19 AM  Result Value Ref Range Status   Specimen Description   Final    BLOOD RIGHT HAND Performed at [facility] [address], [place], [place] [address]    Special Requests   Final    BOTTLES DRAWN AEROBIC AND ANAEROBIC Blood Culture adequate volume Performed at [facility] [address] [place], [place] [address]    Culture   Final    NO GROWTH 5 DAYS Performed at [facility], [place] [address], [place], [place] [address]    Report Status 12/03/2018 FINAL  Final  Culture, blood (Routine X 2) w Reflex to ID Panel     Status: None   Collection Time: 11/28/18 12:19 AM  Result Value Ref Range Status   Specimen Description   Final    BLOOD LEFT HAND Performed at [facility] [address], [place], [place] [address]    Special Requests   Final    BOTTLES DRAWN AEROBIC AND ANAEROBIC Blood Culture adequate volume Performed at [facility] [address],  [place], [place] [address]    Culture   Final    NO GROWTH 5 DAYS Performed at [facility], [place] [address] [place], [place] [address]    Report Status 12/03/2018 FINAL  Final     Studies: No results found.  Scheduled Meds: . Chlorhexidine Gluconate Cloth  6 each Topical Daily  . levothyroxine  75 mcg Oral Daily  . mouth rinse  15 mL Mouth Rinse BID  . multivitamin with minerals  1 tablet Oral Daily  . nebivolol  10 mg Oral Daily  . nystatin  5 mL Oral QID  . scopolamine  1 patch Transdermal Q72H  . vancomycin  125 mg Oral QID    Continuous Infusions: . sodium chloride 1,000 mL (11/30/18 1420)  . sodium chloride    . cefTRIAXone (ROCEPHIN)  IV 2 g (12/04/18 2207)  . potassium chloride 10 mEq (12/05/18 1409)     Flora Lipps, MD  [facility] 12/05/2018

## 2018-12-05 NOTE — Progress Notes (Signed)
Nutrition Follow-up  DOCUMENTATION CODES:   Non-severe (moderate) malnutrition in context of chronic illness  INTERVENTION:   Recommend placement of feeding tube with initiation of nutrition support if within [place].   - Continue Magic Cup BID.  - Continue Boost Breeze once/day PRN and Unjury BID PRN. - Monitor for diet advancement/toleration  NUTRITION DIAGNOSIS:   Moderate Malnutrition related to chronic illness, nausea, vomiting, other (see comment)(night sweats) as evidenced by mild fat depletion, mild muscle depletion, moderate muscle depletion.  Ongoing  GOAL:   Patient will meet greater than or equal to 90% of their needs  Not meeting  MONITOR:   PO intake, Supplement acceptance, Diet advancement, Weight trends, Labs, Skin  REASON FOR ASSESSMENT:   Consult Assessment of nutrition requirement/status  ASSESSMENT:   53 y.o. female with medical history significant of HTN and hypothyroidism. She presented at [facility] with recurrent N/V, fevers, and night sweats of 9 months duration. She notes that the symptoms occur about every 2 weeks. She has nightly fevers and night sweats.  The symptoms occur for days at a time, but have been taking longer and longer to resolve. She presented to the ED after getting dizzy and passing out at the [facility]. She was noted to have a fever of 102.7. Multiple enlarged lymph nodes noted on CT abdomen and pelvis.   Ritta Slot continues.   Pt now confused and not eating anything. She takes minimal sips of Coke throughout the day. Meal completions charted as 0% for pt's last 8 meals.Tech tried to provide pt with Ensure/Magic Cups but she refuses. Spoke with MD about tube placement for inititaion of nutrition support. MD to have further goals of care discussion with new decline. Recommend placement of long term feeding tube sooner rather than later as pt has not had adequate nutrition since admission (if within goals of care).   Weight  noted to decrease from 146 lb on 3/5 and 137 lb this admission. Will continue to monitor trends.   Medications reviewed and include: MVI with minerals, 10 mEq KCl  Labs reviewed: K 3.0 (L)   Diet Order:   Diet Order            Diet full liquid Room service appropriate? Yes; Fluid consistency: Thin  Diet effective now              EDUCATION NEEDS:   Education needs have been addressed  Skin:  Skin Assessment: Skin Integrity Issues: Skin Integrity Issues:: Incisions Incisions: L axilla (2/18)  Last BM:  3/11  Height:   Ht Readings from Last 1 Encounters:  11/11/18 5\' 8"  (1.727 m)    Weight:   Wt Readings from Last 1 Encounters:  12/05/18 62 kg    Ideal Body Weight:  63.64 kg  BMI:  Body mass index is 20.77 kg/m.  Estimated Nutritional Needs:   Kcal:  2100-2300 kcal  Protein:  100-115 grams  Fluid:  >/= 2.1 L/day   Mariana Single RD, LDN Clinical Nutrition Pager # - [phone]

## 2018-12-05 NOTE — Progress Notes (Signed)
Patient has not been up to void since 6am, encouraged patient to get on [facility], positioned the bed for exit, and attempted to help her ambulate. Patient covered herself back up, and rolled the opposite way. Will obtain bladder scan.

## 2018-12-05 NOTE — Progress Notes (Signed)
SLP Cancellation Note  Patient Details Name: Cindy Ward MRN: [id number] DOB: 08/06/1966   Cancelled treatment:       Reason Eval/Treat Not Completed: Patient declined, no reason specified. Pt sleeping upon arrival. Pt opened eyes to verbal and tactile stim, but closed them immediately and did not respond to questions. SLP asked about documented difficulty swallowing, but pt offered no response. Nursing reports minimal intake - pt sips on Cokes all day, but no other significant intake reported. SLP will continue to follow for diet tolerance. Recommend continuing with full liquid diet given thrush.  Rielly Corlett B. Quentin O[facility], [place] Speech Language Pathologist [phone]  Shonna Chock 12/05/2018, 1:40 PM

## 2018-12-05 NOTE — Progress Notes (Signed)
Urology consult called to Dr. Alyson Ingles for urinary retention and repeated I&O cath. Pt will receive additional chemo as an outpatient and will likely develop pancytopenia making her at risk for recurrent UTI/bacteremia and hospital readmission. Urology will see the patient.   Mikey Bussing, DNP, AGPCNP-BC, AOCNP

## 2018-12-06 ENCOUNTER — Inpatient Hospital Stay (HOSPITAL_COMMUNITY): Payer: Medicaid Other

## 2018-12-06 ENCOUNTER — Other Ambulatory Visit: Payer: Self-pay

## 2018-12-06 LAB — COMPREHENSIVE METABOLIC PANEL
ALBUMIN: 2 g/dL — AB (ref 3.5–5.0)
ALT: 57 U/L — ABNORMAL HIGH (ref 0–44)
AST: 59 U/L — ABNORMAL HIGH (ref 15–41)
Alkaline Phosphatase: 613 U/L — ABNORMAL HIGH (ref 38–126)
Anion gap: 13 (ref 5–15)
BUN: 10 mg/dL (ref 6–20)
CO2: 31 mmol/L (ref 22–32)
Calcium: 9.9 mg/dL (ref 8.9–10.3)
Chloride: 100 mmol/L (ref 98–111)
Creatinine, Ser: 0.79 mg/dL (ref 0.44–1.00)
GFR calc Af Amer: >60 mL/min (ref >60)
GFR calc non Af Amer: >60 mL/min (ref >60)
GLUCOSE: 81 mg/dL (ref 70–99)
POTASSIUM: 3.1 mmol/L — AB (ref 3.5–5.1)
Sodium: 144 mmol/L (ref 135–145)
Total Bilirubin: 2.7 mg/dL — ABNORMAL HIGH (ref 0.3–1.2)
Total Protein: 5.2 g/dL — ABNORMAL LOW (ref 6.5–8.1)

## 2018-12-06 LAB — BASIC METABOLIC PANEL
Anion gap: 10 (ref 5–15)
BUN: 11 mg/dL (ref 6–20)
CO2: 32 mmol/L (ref 22–32)
Calcium: 9.5 mg/dL (ref 8.9–10.3)
Chloride: 98 mmol/L (ref 98–111)
Creatinine, Ser: 0.63 mg/dL (ref 0.44–1.00)
GFR calc Af Amer: >60 mL/min (ref >60)
GFR calc non Af Amer: >60 mL/min (ref >60)
Glucose, Bld: 103 mg/dL — ABNORMAL HIGH (ref 70–99)
POTASSIUM: 3.7 mmol/L (ref 3.5–5.1)
Sodium: 140 mmol/L (ref 135–145)

## 2018-12-06 LAB — CBC
HCT: 26.5 % — ABNORMAL LOW (ref 36.0–46.0)
Hemoglobin: 8.1 g/dL — ABNORMAL LOW (ref 12.0–15.0)
MCH: 28.5 pg (ref 26.0–34.0)
MCHC: 30.6 g/dL (ref 30.0–36.0)
MCV: 93.3 fL (ref 80.0–100.0)
NRBC: 0.8 % — AB (ref 0.0–0.2)
Platelets: 211 10*3/uL (ref 150–400)
RBC: 2.84 MIL/uL — ABNORMAL LOW (ref 3.87–5.11)
RDW: 16.7 % — ABNORMAL HIGH (ref 11.5–15.5)
WBC: 7.8 10*3/uL (ref 4.0–10.5)

## 2018-12-06 LAB — URINALYSIS, ROUTINE W REFLEX MICROSCOPIC
BILIRUBIN URINE: NEGATIVE
Glucose, UA: NEGATIVE mg/dL
Ketones, ur: 5 mg/dL — AB
LEUKOCYTE UA: NEGATIVE
NITRITE: NEGATIVE
Protein, ur: NEGATIVE mg/dL
SPECIFIC GRAVITY, URINE: 1.011 (ref 1.005–1.030)
pH: 5 (ref 5.0–8.0)

## 2018-12-06 LAB — MAGNESIUM
Magnesium: 1.7 mg/dL (ref 1.7–2.4)
Magnesium: 2.3 mg/dL (ref 1.7–2.4)

## 2018-12-06 MED ORDER — MAGNESIUM SULFATE 2 GM/50ML IV SOLN
2.0000 g | Freq: Once | INTRAVENOUS | Status: AC
  Administered 2018-12-06: 2 g via INTRAVENOUS
  Filled 2018-12-06: qty 50

## 2018-12-06 MED ORDER — DEXTROSE-NACL 5-0.45 % IV SOLN
INTRAVENOUS | Status: AC
  Administered 2018-12-06: 11:00:00 via INTRAVENOUS

## 2018-12-06 MED ORDER — POTASSIUM CHLORIDE 10 MEQ/100ML IV SOLN
10.0000 meq | INTRAVENOUS | Status: AC
  Administered 2018-12-06 (×5): 10 meq via INTRAVENOUS
  Filled 2018-12-06 (×4): qty 100

## 2018-12-06 NOTE — Progress Notes (Signed)
Patient off unit to CT. Scopolamine patch removed prior to transport, per order.

## 2018-12-06 NOTE — Progress Notes (Signed)
Pt responds yes or no to some questions, other times refuses to answer. Pt refused all medications as well.  Rosine Beat, RN

## 2018-12-06 NOTE — Progress Notes (Signed)
HEMATOLOGY-ONCOLOGY PROGRESS NOTE  SUBJECTIVE: Patient unwilling to answer questions this morning.  Chart reviewed and spoke with nursing.  Foley was placed yesterday due to urinary retention.  Has not had any diarrhea.  The patient has been refusing meds and nutrition.  Refusing to get out of bed.  Note that she had a fever up to 101 last night.    Hodgkin lymphoma ([place])   11/18/2018 Initial Diagnosis    Hodgkin lymphoma ([place])    11/19/2018 -  Chemotherapy    The patient had DOXOrubicin (ADRIAMYCIN) chemo injection 48 mg, 25 mg/m2 = 48 mg, Intravenous,  Once, 1 of 6 cycles palonosetron (ALOXI) injection 0.25 mg, 0.25 mg, Intravenous,  Once, 1 of 6 cycles pegfilgrastim-cbqv (UDENYCA) injection 6 mg, 6 mg, Subcutaneous, Once, 1 of 6 cycles dacarbazine (DTIC) 710 mg in sodium chloride 0.9 % 250 mL chemo infusion, 375 mg/m2 = 710 mg, Intravenous,  Once, 1 of 6 cycles brentuximab vedotin (ADCETRIS) 90 mg in sodium chloride 0.9 % 100 mL chemo infusion, 1.2 mg/kg = 90 mg, Intravenous,  Once, 1 of 6 cycles vinBLAStine (VELBAN) 11.3 mg in sodium chloride 0.9 % 50 mL chemo infusion, 6 mg/m2 = 11.3 mg, Intravenous, Once, 1 of 6 cycles fosaprepitant (EMEND) 150 mg, dexamethasone (DECADRON) 12 mg in sodium chloride 0.9 % 145 mL IVPB, , Intravenous,  Once, 1 of 6 cycles  for chemotherapy treatment.      REVIEW OF SYSTEMS:   Full review of systems could not be obtained because patient would not answer my questions.  I have reviewed the past medical history, past surgical history, social history and family history with the patient and they are unchanged from previous note.   PHYSICAL EXAMINATION: ECOG PERFORMANCE STATUS: 3 - Symptomatic, >50% confined to bed  Vitals:   12/06/18 0600 12/06/18 0700  BP:  132/68  Pulse:  (!) 105  Resp:  20  Temp: 99.5 F (37.5 C) 98.3 F (36.8 C)  SpO2:  96%   Filed Weights   12/04/18 0421 12/05/18 0550 12/06/18 0500  Weight: 143 lb 4.8 oz (65 kg) 136 lb 9.6 oz  (62 kg) 130 lb 1.1 oz (59 kg)    GENERAL: Sleeping in bed.  No acute distress.  Refuses to answer questions. SKIN: skin color, texture, turgor are normal, no rashes or significant lesions.  Left breast is no longer reddened and swollen. OROPHARYNX:no exudate, no erythema and lips, buccal mucosa, and tongue normal  NECK: supple, thyroid normal size, non-tender, without nodularity LYMPH:  no palpable lymphadenopathy in the cervical, axillary or inguinal LUNGS: clear to auscultation and percussion with normal breathing effort HEART: Cardiac.  No edema. ABDOMEN:abdomen soft, non-tender and normal bowel sounds Musculoskeletal:no cyanosis of digits and no clubbing  NEURO: no focal motor/sensory deficits  LABORATORY DATA:  I have reviewed the data as listed CMP Latest Ref Rng & Units 12/06/2018 12/05/2018 12/04/2018  Glucose 70 - 99 mg/dL 81 81 80  BUN 6 - 20 mg/dL 10 11 12   Creatinine 0.44 - 1.00 mg/dL 0.79 0.60 0.56  Sodium 135 - 145 mmol/L 144 145 147(H)  Potassium 3.5 - 5.1 mmol/L 3.1(L) 3.0(L) 3.1(L)  Chloride 98 - 111 mmol/L 100 102 108  CO2 22 - 32 mmol/L 31 29 27   Calcium 8.9 - 10.3 mg/dL 9.9 9.5 9.0  Total Protein 6.5 - 8.1 g/dL 5.2(L) 5.0(L) 4.8(L)  Total Bilirubin 0.3 - 1.2 mg/dL 2.7(H) 3.5(H) 3.8(H)  Alkaline Phos 38 - 126 U/L 613(H) 710(H) 363(H)  AST  15 - 41 U/L 59(H) 60(H) 45(H)  ALT 0 - 44 U/L 57(H) 63(H) 62(H)    Lab Results  Component Value Date   WBC 7.8 12/06/2018   HGB 8.1 (L) 12/06/2018   HCT 26.5 (L) 12/06/2018   MCV 93.3 12/06/2018   PLT 211 12/06/2018   NEUTROABS 5.6 12/05/2018    ASSESSMENT AND PLAN: Stage IV Hodgkin lymphoma -Port placed in 10/2018; TTE showed normal LVEF -The patient received day 1 of cycle 1 ofAVD + Adcetris on 11/19/2018.   -Status post Granix. -White blood cell count and platelet count have now recovered. -Recommend PRN anti-emetics, including Zofran and Compazine -CT scan of the abdomen pelvis showed the intra-abdominal lymph  nodes appear to be improving. Spleen is decreased in size. -Next dose of chemo due 12/03/2018 butwe will delay the treatment until clinical improvement. Date TBD.  Normocytic anemia -Likely secondary to anemia of chronic disease and underlying lymphoma -Hemoglobinis 8.1. -Supportive transfusion to keep Hgb >7. Blood products to be irradiated.   Thrombocytopenia -resolved -Daily CBC -Goal plts >10k in the absence of bleeding. Blood products to be irradiated.  Neutropenic fever  -Secondary to underlying lymphoma,bacteremiaand C. diff colitis -Patient had+ blood cultures (E Coli and Group B Strep). Repeat cultures negative to date. -Completed ceftriaxone -The patient again had a fever last evening.  I discussed this with the hospitalist and will obtain blood cultures and urine culture. -Consider reconsulting infectious disease.  Severe protein malnutrition -Patient has lost > 20 lbs since the onset of her symptoms, and her most recent albumin was 2.0 with anasarca, consistent with severe protein malnutrition -Dietitian is following. -The patient is now refusing to eat. -If her nutrition remains very poor and she is unable to increase her PO intake, we will have to consider enteral nutrition, such as PEG tube, to help improve her nutritional intake.  Diarrhea with abdominal discomfort- Resolved -Stool forC.diffpositive.  -Continue oral vancomycin for 14 days per ID. -CT the abdomen pelvis did not show any concerning findings in the left lower quadrant of her abdomen. -Abdominal pain has resolved. Stools are now formed.   -Continue pain medsas needed.  Electrolyte abnormalities -Electrolyte abnormalities overall improved. Replete per primary team.  Hyperbilirubinemia -Unclear etiology. CT scan and abdominal U/Sdid not show any focal liver abnormality or obstruction. Possibly reactive.  -Total bilirubin is trending down. -GI signed off.  Urinary  retention -Foley catheter was placed yesterday. -Urology has been consulted. -Check urinalysis due to fever last evening. -Hospitalist has discontinued the scopolamine patch.  Deconditioning -The patient is refusing to work with physical therapy and get out of bed. -She has had continued functional decline. -I have talked with the patient told her that she needs to get out of bed, eat her meals, and take her medications.  If her functional status continues to decline, I have discussed that she may not be a candidate for additional chemotherapy. -If care refusal continues, consider consulting palliative care for goals of care..  Patient's daughter, Anderson Malta, [phone], requests periodic updates. Will attempt to call the daughter later today.  Mikey Bussing, DNP, AGPCNP-BC, AOCNP

## 2018-12-06 NOTE — Progress Notes (Signed)
Patient refused morning medicine and refuse to drink water. No oral input this morning.  Will continue to monitor.

## 2018-12-06 NOTE — Consult Note (Signed)
Consultant Final Sign-Off Note    Assessment/Final recommendations  Cindy Ward is a 53 y.o. female followed by me for urinary retention   Wound care (if applicable):    Diet at discharge: per primary team   Activity at discharge: per primary team   Follow-up appointment:     Pending results:  Unresulted Labs (From admission, onward)    Start     Ordered   12/07/18 0500  Magnesium  Tomorrow morning,   R    Question:  Specimen collection method  Answer:  IV Team=IV Team collect   12/06/18 1005   12/07/18 0500  Comprehensive metabolic panel  Tomorrow morning,   R    Question:  Specimen collection method  Answer:  IV Team=IV Team collect   12/06/18 1005   12/07/18 0500  CBC  Tomorrow morning,   R    Question:  Specimen collection method  Answer:  IV Team=IV Team collect   12/06/18 1005   12/07/18 0500  Phosphorus  Tomorrow morning,   R    Question:  Specimen collection method  Answer:  IV Team=IV Team collect   12/06/18 1005   12/06/18 0928  Urinalysis, Routine w reflex microscopic  Once,   R     12/06/18 0927   12/06/18 0926  Culture, blood (routine x 2)  BLOOD CULTURE X 2,   R     12/06/18 0927   12/06/18 5784  Basic metabolic panel  Once,   R    Comments:  Obtain POST POTASSIUM RUN   Question:  Specimen collection method  Answer:  IV Team=IV Team collect   12/06/18 0653   12/06/18 0654  Magnesium  Once,   R    Comments:  Obtain POST POTASSIUM RUN   Question:  Specimen collection method  Answer:  IV Team=IV Team collect   12/06/18 0653   11/26/18 0500  Creatinine, serum  Daily,   R    Question:  Specimen collection method  Answer:  Unit=Unit collect   11/25/18 0848           Medication recommendations:   Other recommendations:    Thank you for allowing [place] to participate in the care of your patient!  Please consult [place] again if you have further needs for your patient.  Nicolette Bang 12/06/2018 1:31 PM    Subjective   The patient has persistent  difficulty emptying her bladder and had a foley placed yesterday. The foley will remain in place and she will follwoup with [facility] in 1 week after discharge for a voiding trial   Objective  Vital signs in last 24 hours: Temp:  [98.3 F (36.8 C)-101 F (38.3 C)] 98.3 F (36.8 C) (03/13 0700) Pulse Rate:  [105-110] 105 (03/13 0700) Resp:  [19-20] 20 (03/13 0700) BP: (115-132)/(58-68) 132/68 (03/13 0700) SpO2:  [95 %-99 %] 96 % (03/13 0700) Weight:  [59 kg] 59 kg (03/13 0500)    Pertinent labs and Studies: Recent Labs    12/04/18 0411 12/05/18 0425 12/06/18 0440  WBC 4.1 7.5 7.8  HGB 8.5* 8.3* 8.1*  HCT 27.5* 25.9* 26.5*   BMET Recent Labs    12/05/18 0425 12/06/18 0440  NA 145 144  K 3.0* 3.1*  CL 102 100  CO2 29 31  GLUCOSE 81 81  BUN 11 10  CREATININE 0.60 0.79  CALCIUM 9.5 9.9   No results for input(s): LABURIN in the last 72 hours. Results for orders placed or performed during the  hospital encounter of 11/11/18  MRSA PCR Screening     Status: None   Collection Time: 11/11/18  4:21 PM  Result Value Ref Range Status   MRSA by PCR NEGATIVE NEGATIVE Final    Comment:        The GeneXpert MRSA Assay (FDA approved for NASAL specimens only), is one component of a comprehensive MRSA colonization surveillance program. It is not intended to diagnose MRSA infection nor to guide or monitor treatment for MRSA infections. Performed at [facility], [place] [address], [place], [place] [address]   Culture, blood (routine x 2)     Status: None   Collection Time: 11/13/18  3:54 PM  Result Value Ref Range Status   Specimen Description   Final    BLOOD RIGHT ANTECUBITAL Performed at [place] [address] [place], [place] [address]    Special Requests   Final    BOTTLES DRAWN AEROBIC AND ANAEROBIC Blood Culture adequate volume Performed at [place] [address], [place], [place] [address]     Culture   Final    NO GROWTH 5 DAYS Performed at [facility], [place] [address] [place], [place] [address]    Report Status 11/18/2018 FINAL  Final  Culture, blood (routine x 2)     Status: None   Collection Time: 11/13/18  4:02 PM  Result Value Ref Range Status   Specimen Description   Final    BLOOD RIGHT FOREARM Performed at [facility], [place] [address] [place], [place] [address]    Special Requests   Final    BOTTLES DRAWN AEROBIC AND ANAEROBIC Blood Culture adequate volume Performed at [place] [address] [place], [place] [address]    Culture   Final    NO GROWTH 5 DAYS Performed at [facility], [place] [address] [place], [place] [address]    Report Status 11/18/2018 FINAL  Final  Culture, Urine     Status: None   Collection Time: 11/14/18  4:20 PM  Result Value Ref Range Status   Specimen Description   Final    URINE, CLEAN CATCH Performed at [facility], [place] [address], [place], [place] [address]    Special Requests   Final    NONE Performed at [facility], [place] [address] [place], [place] [address]    Culture   Final    NO GROWTH Performed at [facility], [place] [address] [place], [place] [address]    Report Status 11/15/2018 FINAL  Final  Culture, blood (routine x 2)     Status: None   Collection Time: 11/21/18  2:51 PM  Result Value Ref Range Status   Specimen Description   Final    BLOOD LEFT ANTECUBITAL Performed at [place] [address], [place], [place] [address]    Special Requests   Final    BOTTLES DRAWN AEROBIC AND ANAEROBIC Blood Culture adequate volume Performed at [place] [address] [place], [place] [address]    Culture   Final    NO GROWTH 5 DAYS Performed at [facility], [place] [address] [place], [place] [address]    Report Status 11/26/2018 FINAL  Final  Culture, blood (routine x 2)      Status: None   Collection Time: 11/21/18  2:59 PM  Result Value Ref Range Status   Specimen  Description   Final    BLOOD RIGHT ANTECUBITAL Performed at [place] [address] [place], [place] [address]    Special Requests   Final    BOTTLES DRAWN AEROBIC ONLY Blood Culture adequate volume Performed at [place] [address], [place], [place] [address]    Culture   Final    NO GROWTH 5 DAYS Performed at [facility], [place] [address] [place], [place] [address]    Report Status 11/26/2018 FINAL  Final  Culture, blood (routine x 2)     Status: Abnormal   Collection Time: 11/25/18  9:08 AM  Result Value Ref Range Status   Specimen Description   Final    BLOOD RIGHT HAND Performed at [place] [address], [place], [place] [address]    Special Requests   Final    BAA BCAV Performed at [place] [address] [place], [place] [address]    Culture  Setup Time   Final    GRAM POSITIVE COCCI GRAM NEGATIVE RODS IN BOTH AEROBIC AND ANAEROBIC BOTTLES CRITICAL VALUE NOTED.  VALUE IS CONSISTENT WITH PREVIOUSLY REPORTED AND CALLED VALUE.    Culture (A)  Final    ESCHERICHIA COLI GROUP B STREP(S.AGALACTIAE)ISOLATED SUSCEPTIBILITIES PERFORMED ON PREVIOUS CULTURE WITHIN THE LAST 5 DAYS. Performed at [facility], [place] [address], [place], [place] [address]    Report Status 11/28/2018 FINAL  Final  Culture, blood (routine x 2)     Status: Abnormal   Collection Time: 11/25/18  9:08 AM  Result Value Ref Range Status   Specimen Description   Final    BLOOD LEFT ARM Performed at [place] [address], [place], [place] [address]    Special Requests   Final    BAA BCAV Performed at [place] [address], [place], [place] [address]    Culture  Setup Time   Final    GRAM POSITIVE COCCI GRAM NEGATIVE RODS IN BOTH AEROBIC AND  ANAEROBIC BOTTLES CRITICAL RESULT CALLED TO, READ BACK BY AND VERIFIED WITH: E GREEN PHARMD 11/26/18 0025 JDW Performed at [facility], [place] [address] [place], [place] [address]    Culture (A)  Final    ESCHERICHIA COLI GROUP B STREP(S.AGALACTIAE)ISOLATED    Report Status 11/28/2018 FINAL  Final   Organism ID, Bacteria ESCHERICHIA COLI  Final   Organism ID, Bacteria GROUP B STREP(S.AGALACTIAE)ISOLATED  Final      Susceptibility   Escherichia coli - MIC*    AMPICILLIN >=32 RESISTANT Resistant     CEFAZOLIN <=4 SENSITIVE Sensitive     CEFEPIME <=1 SENSITIVE Sensitive     CEFTAZIDIME <=1 SENSITIVE Sensitive     CEFTRIAXONE <=1 SENSITIVE Sensitive     CIPROFLOXACIN >=4 RESISTANT Resistant     GENTAMICIN <=1 SENSITIVE Sensitive     IMIPENEM <=0.25 SENSITIVE Sensitive     TRIMETH/SULFA <=20 SENSITIVE Sensitive     AMPICILLIN/SULBACTAM >=32 RESISTANT Resistant     PIP/TAZO <=4 SENSITIVE Sensitive     Extended ESBL NEGATIVE Sensitive     * ESCHERICHIA COLI   Group b strep(s.agalactiae)isolated - MIC*    CLINDAMYCIN >=1 RESISTANT Resistant     AMPICILLIN <=0.25 SENSITIVE Sensitive     ERYTHROMYCIN >=8 RESISTANT Resistant     VANCOMYCIN 0.5 SENSITIVE Sensitive     CEFTRIAXONE <=0.12 SENSITIVE Sensitive     LEVOFLOXACIN 1 SENSITIVE Sensitive  PENICILLIN SENSITIVE Sensitive     * GROUP B STREP(S.AGALACTIAE)ISOLATED  Blood Culture ID Panel (Reflexed)     Status: Abnormal   Collection Time: 11/25/18  9:08 AM  Result Value Ref Range Status   Enterococcus species NOT DETECTED NOT DETECTED Final   Listeria monocytogenes NOT DETECTED NOT DETECTED Final   Staphylococcus species NOT DETECTED NOT DETECTED Final   Staphylococcus aureus (BCID) NOT DETECTED NOT DETECTED Final   Streptococcus species DETECTED (A) NOT DETECTED Final    Comment: CRITICAL RESULT CALLED TO, READ BACK BY AND VERIFIED WITH: E GREEN PHARMD 11/26/18 0025 JDW    Streptococcus agalactiae DETECTED (A) NOT DETECTED  Final    Comment: CRITICAL RESULT CALLED TO, READ BACK BY AND VERIFIED WITH: E GREEN PHARMD 11/26/18 0025 JDW    Streptococcus pneumoniae NOT DETECTED NOT DETECTED Final   Streptococcus pyogenes NOT DETECTED NOT DETECTED Final   Acinetobacter baumannii NOT DETECTED NOT DETECTED Final   Enterobacteriaceae species DETECTED (A) NOT DETECTED Final    Comment: Enterobacteriaceae represent a large family of gram-negative bacteria, not a single organism. CRITICAL RESULT CALLED TO, READ BACK BY AND VERIFIED WITH: E GREEN PHARMD 11/26/18 0025 JDW    Enterobacter cloacae complex NOT DETECTED NOT DETECTED Final   Escherichia coli DETECTED (A) NOT DETECTED Final    Comment: CRITICAL RESULT CALLED TO, READ BACK BY AND VERIFIED WITH: E GREEN PHARMD 11/26/18 0025 JDW    Klebsiella oxytoca NOT DETECTED NOT DETECTED Final   Klebsiella pneumoniae NOT DETECTED NOT DETECTED Final   Proteus species NOT DETECTED NOT DETECTED Final   Serratia marcescens NOT DETECTED NOT DETECTED Final   Carbapenem resistance NOT DETECTED NOT DETECTED Final   Haemophilus influenzae NOT DETECTED NOT DETECTED Final   Neisseria meningitidis NOT DETECTED NOT DETECTED Final   Pseudomonas aeruginosa NOT DETECTED NOT DETECTED Final   Candida albicans NOT DETECTED NOT DETECTED Final   Candida glabrata NOT DETECTED NOT DETECTED Final   Candida krusei NOT DETECTED NOT DETECTED Final   Candida parapsilosis NOT DETECTED NOT DETECTED Final   Candida tropicalis NOT DETECTED NOT DETECTED Final    Comment: Performed at [facility], [place] [address], [place], [place] [address]  Fungus culture, blood     Status: None   Collection Time: 11/25/18  1:26 PM  Result Value Ref Range Status   Specimen Description   Final    BLOOD Performed at [facility], [place] [address] [place], [place] [address]    Special Requests   Final    NONE Performed at [facility], [place] [address] [place], [place] [address]    Culture    Final    NO GROWTH 7 DAYS NO FUNGUS ISOLATED Performed at [facility], [place] [address], [place], [place] [address]    Report Status 12/02/2018 FINAL  Final  C difficile quick scan w PCR reflex     Status: Abnormal   Collection Time: 11/27/18  8:00 AM  Result Value Ref Range Status   C Diff antigen POSITIVE (A) NEGATIVE Final   C Diff toxin NEGATIVE NEGATIVE Final   C Diff interpretation Results are indeterminate. See PCR results.  Final    Comment: Performed at [facility], [place] [address] [place],  [address]  C. Diff by PCR, Reflexed     Status: Abnormal   Collection Time: 11/27/18  8:00 AM  Result Value Ref Range Status   Toxigenic C. Difficile by PCR POSITIVE (  A) NEGATIVE Final    Comment: Positive for toxigenic C. difficile with little to no toxin production. Only treat if clinical presentation suggests symptomatic illness. Performed at [facility], [place] [address] [place], [place] [address]   Culture, blood (Routine X 2) w Reflex to ID Panel     Status: None   Collection Time: 11/28/18 12:19 AM  Result Value Ref Range Status   Specimen Description   Final    BLOOD RIGHT HAND Performed at [place] [address] [place], [place] [address]    Special Requests   Final    BOTTLES DRAWN AEROBIC AND ANAEROBIC Blood Culture adequate volume Performed at [place] [address] [place], [place] [address]    Culture   Final    NO GROWTH 5 DAYS Performed at [facility], [place] [address] [place], [place] [address]    Report Status 12/03/2018 FINAL  Final  Culture, blood (Routine X 2) w Reflex to ID Panel     Status: None   Collection Time: 11/28/18 12:19 AM  Result Value Ref Range Status   Specimen Description   Final    BLOOD LEFT HAND Performed at [place] [address] [place], [place] [address]    Special Requests   Final    BOTTLES DRAWN AEROBIC AND  ANAEROBIC Blood Culture adequate volume Performed at [place] [address], [place], [place] [address]    Culture   Final    NO GROWTH 5 DAYS Performed at [facility], [place] [address] [place], [place] [address]    Report Status 12/03/2018 FINAL  Final    Imaging: Ct Head Wo Contrast  Result Date: 12/06/2018 CLINICAL DATA:  Confusion, altered mental status. EXAM: CT HEAD WITHOUT CONTRAST TECHNIQUE: Contiguous axial images were obtained from the base of the skull through the vertex without intravenous contrast. COMPARISON:  Head CT scan 11/25/2018. FINDINGS: Brain: No evidence of acute infarction, hemorrhage, hydrocephalus, extra-axial collection or mass lesion/mass effect. Vascular: No hyperdense vessel or unexpected calcification. Skull: No fracture or focal lesion. Sinuses/Orbits: Negative. Other: None. IMPRESSION: Normal head CT. Electronically Signed   By: Inge Rise M.D.   On: 12/06/2018 10:36

## 2018-12-06 NOTE — Progress Notes (Signed)
PROGRESS NOTE  Cindy Ward AST:[id number] DOB: September 17, 1966 DOA: 11/11/2018 PCP: [name], MD   LOS: 25 days   Brief narrative:  53 year old female with history of essential hypertension, hypothyroidism initially presented to [facility] with complaints of recurrent nausea, vomiting, fever and night sweats.  Upon extensive work-up she was diagnosed with Hodgkin's lymphoma, stage IV.  Oncology team was consulted and patient was transferred here for further care and management.  Lymph node biopsy was consistent with classic Hodgkin's lymphoma.  Hospital course has been complicated by bacteremia, intermittent encephalopathy, severe protein calorie malnutrition and electrolyte imbalance.  She is also been pancytopenic since she is on chemotherapy received Granix.  Pulmonary and oncology team has been following.  Subjective:  Patient has not improved.  She spiked fever overnight.  Her mentation has worsened.  She does have a poor appetite and has refused all medications.  No mention of further loose bowel movements.    Assessment/Plan:  Principal Problem:   Hodgkin lymphoma (HCC) Active Problems:   Lymphadenopathy   Fever   Splenomegaly   Liver enzyme elevation   Thrombocytopenia (HCC)   Unintentional weight loss   Hashimoto's thyroiditis   Goiter   Hypertension   Aortic atherosclerosis (HCC)   Pancytopenia (HCC)   Malnutrition of moderate degree   Normocytic anemia   Neutropenic fever (HCC)   Oral thrush   Enteritis due to Clostridium difficile   Streptococcal bacteremia   Palliative care by specialist   Goals of care, counseling/discussion   Total bilirubin, elevated  Stage IV Hodgkin's lymphoma, new diagnosis  Patient underwent port placement on 2/24 and chemotherapy on 2/25.    Plans for second round of chemotherapy outpatient when patient is much stronger and total bilirubin is at least below 2.0 per Dr Maylon Peppers, oncology but her functional capacity continues to  decline with poor nutrition.  Patient might not be a candidate for further chemotherapy in the future due to functional decline.  Intermittent encephalopathy likely metabolic in nature.  Previous CT head scan was negative.  Will repeat CT head scan today.  Does not have respiratory symptoms oxygen issues.  Doses of medications have been decreased.  Will discontinue scopolamine patch.  Fever despite being on treatment for C. difficile with p.o. vancomycin and IV Rocephin for E. coli bacteremia.  We will repeat blood cultures, chest x-ray was negative for acute infection.  Check a urinalysis.  Patient received a Foley catheter yesterday for urinary retention.  Left breast erythema without induration.  No nipple drainage.  Unsure etiology.  Will closely monitor.  Possible lymphatic obstruction.proved erythema.  No signs of localized induration or abscess.  Hypokalemia Patient has remained persistently hypokalemic likely secondary to poor oral intake and recent C. difficile.  Will continue to replenish potassium through IV due to poor oral intake.  Magnesium level on 12/06/2018 was 1.7.  Pancytopenia with mild rectal/vaginal bleeding.  No further bleeding. Anemia of chronic disease. Hemoglobin 8.1 today.  Will closely monitor. Platelet count is 144 today.  We will continue to monitor closely.   ultrasound of the pelvis not reveal any uterine pathology.  Sinus tachycardia; greatly improved with hydration and beta-blockers.  Bacteremia with E. coli and group B strep with neutropenia C. difficile colitis, slowly improving Continue Rocephin for total of 10 days.  Completed Rocephin 10/10.  Oral vancomycin 9/14 as per infectious disease recommendation.  Repeat blood culture from 11/28/2018 is negative so far.  Repeat blood cultures today check urinalysis chest x-ray.  Elevated  Total Bilirubin; possible due to hepatotoxic meds -CT abdomen pelvis was negative for any acute pathology including right upper  quadrant ultrasound.    Trending down bilirubin.  Severe protein calorie malnutrition -Poor oral intake.  I have explained the patient's daughter regarding this.  Continue to encourage oral intake.  We will put the patient on D5 normal saline for now.  Acute urinary retention with bilateral hydroureteronephrosis Failed intermittent catheterization.  Urology was consulted.  Patient has received a Foley catheter.  Will discontinue scopolamine patch which might help.    Mild dysphagia with oral thrush - Encourage p.o. intake. - Nystatin to continue.  Patient recently received Diflucan which has been discontinued.  Hypothyroidism -Continue Synthroid  Generalized debility - PT on board.  Recommended skilled nursing facility placement but patient does not have payor source for rehabilitation. Medicaid application pending.   VTE Prophylaxis: SCD  Code Status: Full code  Family Communication: I had a prolonged discussion with patient's daughter Ms Anderson Malta on the phone and updated her about the clinical condition of the patient. I have encouraged her to discuss with her other sister, regarding the poor prognosis and involvement of palliative care and possible hospice if her clinical condition continues to deteriorate.  .  Disposition Plan: Difficult disposition at this time.   Consultants:  Pulmonary, oncology, GI  Procedures:  Bx 2/21  Port cath 2/24  Antibiotics: Anti-infectives (From admission, onward)   Start     Dose/Rate Route Frequency Ordered Stop   11/27/18 1130  vancomycin (VANCOCIN) 50 mg/mL oral solution 125 mg     125 mg Oral 4 times daily 11/27/18 1051 12/10/18 2359   11/27/18 0915  metroNIDAZOLE (FLAGYL) IVPB 500 mg  Status:  Discontinued     500 mg 100 mL/hr over 60 Minutes Intravenous Every 8 hours 11/27/18 0912 11/27/18 1051   11/26/18 0200  cefTRIAXone (ROCEPHIN) 2 g in sodium chloride 0.9 % 100 mL IVPB     2 g 200 mL/hr over 30 Minutes Intravenous Daily at  bedtime 11/26/18 0059 12/06/18 0700   11/25/18 1200  fluconazole (DIFLUCAN) IVPB 200 mg  Status:  Discontinued     200 mg 100 mL/hr over 60 Minutes Intravenous Every 24 hours 11/25/18 0836 12/02/18 1255   11/25/18 1000  vancomycin (VANCOCIN) IVPB 1000 mg/200 mL premix  Status:  Discontinued     1,000 mg 200 mL/hr over 60 Minutes Intravenous Every 12 hours 11/25/18 0848 11/26/18 0827   11/25/18 1000  ceFEPIme (MAXIPIME) 2 g in sodium chloride 0.9 % 100 mL IVPB  Status:  Discontinued     2 g 200 mL/hr over 30 Minutes Intravenous Every 8 hours 11/25/18 0848 11/26/18 0058   11/25/18 0900  vancomycin (VANCOCIN) 1,500 mg in sodium chloride 0.9 % 500 mL IVPB     1,500 mg 250 mL/hr over 120 Minutes Intravenous  Once 11/25/18 0848 11/25/18 1135   11/24/18 1500  fluconazole (DIFLUCAN) tablet 200 mg  Status:  Discontinued     200 mg Oral Daily 11/24/18 1450 11/25/18 0836   11/18/18 0600  ceFAZolin (ANCEF) IVPB 2g/100 mL premix     2 g 200 mL/hr over 30 Minutes Intravenous To Radiology 11/17/18 1243 11/18/18 1130   11/12/18 0800  cefTRIAXone (ROCEPHIN) 2 g in sodium chloride 0.9 % 100 mL IVPB  Status:  Discontinued     2 g 200 mL/hr over 30 Minutes Intravenous Every 24 hours 11/11/18 1711 11/12/18 1057     Objective: Vitals:   12/06/18 0600  12/06/18 0700  BP:  132/68  Pulse:  (!) 105  Resp:  20  Temp: 99.5 F (37.5 C) 98.3 F (36.8 C)  SpO2:  96%    Intake/Output Summary (Last 24 hours) at 12/06/2018 1344 Last data filed at 12/06/2018 0600 Gross per 24 hour  Intake 650 ml  Output 2750 ml  Net -2100 ml   Filed Weights   12/04/18 0421 12/05/18 0550 12/06/18 0500  Weight: 65 kg 62 kg 59 kg   Body mass index is 19.78 kg/m.   Physical Exam: General: Patient is more somnolent and confused today.  Thinly built.  HENT: Normocephalic, pupils equally reacting to light pupil dilated reacting.  Dry mucosa.   Mild pallor noted.   Chest:  Diminished breath sounds bilaterally. No crackles or  wheezes.  Left breast erythema-slightly improved CVS: S1 &S2 heard. No murmur.  Regular rate and rhythm. Abdomen: Soft, nontender, nondistended.  Bowel sounds are heard.   Extremities: No cyanosis, clubbing or edema.  Peripheral pulses are palpable. Psych: Somnolent, confused and mildly communicative CNS:  No cranial nerve deficits.  Somnolent moves all extremities Skin: Warm and dry.  Left breast with mild erythema.   Data Review: I have personally reviewed the following laboratory data and studies,  CBC: Recent Labs  Lab 12/01/18 0404 12/02/18 0324 12/03/18 0343 12/04/18 0411 12/05/18 0425 12/06/18 0440  WBC 0.6* 0.9* 2.0* 4.1 7.5 7.8  NEUTROABS 0.2* 0.4* 0.6* 2.4 5.6  --   HGB 8.1* 7.3* 8.8* 8.5* 8.3* 8.1*  HCT 26.0* 24.1* 27.5* 27.5* 25.9* 26.5*  MCV 90.3 90.9 89.3 91.1 90.6 93.3  PLT 61* 91* 107* 135* 181 062   Basic Metabolic Panel: Recent Labs  Lab 11/30/18 0345 12/01/18 0404 12/02/18 0324 12/03/18 0343 12/03/18 1501 12/04/18 0411 12/05/18 0425 12/06/18 0440  NA 141 143 147* 147* 145 147* 145 144  K 3.3* 3.5 3.4* 2.9* 3.1* 3.1* 3.0* 3.1*  CL 112* 113* 116* 111 111 108 102 100  CO2 22 23 25 26 25 27 29 31   GLUCOSE 120* 98 95 94 79 80 81 81  BUN 8 8 10 11 10 12 11 10   CREATININE 0.49 0.47 0.50 0.57 0.54 0.56 0.60 0.79  CALCIUM 6.4* 7.5* 8.0* 8.6* 8.0* 9.0 9.5 9.9  MG 2.2 1.9 1.8 1.7  --  1.8  --  1.7  PHOS 2.9 2.9 3.2  --   --   --   --   --    Liver Function Tests: Recent Labs  Lab 12/02/18 0324 12/03/18 0343 12/04/18 0411 12/05/18 0425 12/06/18 0440  AST 48* 41 45* 60* 59*  ALT 65* 66* 62* 63* 57*  ALKPHOS 99 132* 363* 710* 613*  BILITOT 5.9* 4.2* 3.8* 3.5* 2.7*  PROT 4.6* 4.8* 4.8* 5.0* 5.2*  ALBUMIN 1.6* 1.8* 1.7* 1.8* 2.0*   No results for input(s): LIPASE, AMYLASE in the last 168 hours. Recent Labs  Lab 12/05/18 1355  AMMONIA 10   Cardiac Enzymes: No results for input(s): CKTOTAL, CKMB, CKMBINDEX, TROPONINI in the last 168 hours. BNP  (last 3 results) Recent Labs    11/21/18 1519  BNP 1,731.6*    ProBNP (last 3 results) No results for input(s): PROBNP in the last 8760 hours.  CBG: No results for input(s): GLUCAP in the last 168 hours. Recent Results (from the past 240 hour(s))  C difficile quick scan w PCR reflex     Status: Abnormal   Collection Time: 11/27/18  8:00 AM  Result Value Ref  Range Status   C Diff antigen POSITIVE (A) NEGATIVE Final   C Diff toxin NEGATIVE NEGATIVE Final   C Diff interpretation Results are indeterminate. See PCR results.  Final    Comment: Performed at [facility], [place] [address] [place], [place] [address]  C. Diff by PCR, Reflexed     Status: Abnormal   Collection Time: 11/27/18  8:00 AM  Result Value Ref Range Status   Toxigenic C. Difficile by PCR POSITIVE (A) NEGATIVE Final    Comment: Positive for toxigenic C. difficile with little to no toxin production. Only treat if clinical presentation suggests symptomatic illness. Performed at [facility], [place] [address], [place], [place] [address]   Culture, blood (Routine X 2) w Reflex to ID Panel     Status: None   Collection Time: 11/28/18 12:19 AM  Result Value Ref Range Status   Specimen Description   Final    BLOOD RIGHT HAND Performed at [facility] [address] [place], [place] [address]    Special Requests   Final    BOTTLES DRAWN AEROBIC AND ANAEROBIC Blood Culture adequate volume Performed at [facility] [address] [place], [place] [address]    Culture   Final    NO GROWTH 5 DAYS Performed at [facility], [place] [address] [place], [place] [address]    Report Status 12/03/2018 FINAL  Final  Culture, blood (Routine X 2) w Reflex to ID Panel     Status: None   Collection Time: 11/28/18 12:19 AM  Result Value Ref Range Status   Specimen Description   Final    BLOOD LEFT HAND Performed at [facility] [address] [place], [place] [address]    Special Requests   Final    BOTTLES DRAWN AEROBIC AND ANAEROBIC Blood Culture adequate volume Performed at [facility] [address] [place], [place] [address]    Culture   Final    NO GROWTH 5 DAYS Performed at [facility], [place] [address], [place],  [address]    Report Status 12/03/2018 FINAL  Final     Studies: Ct Head Wo Contrast  Result Date: 12/06/2018 CLINICAL DATA:  Confusion, altered mental status. EXAM: CT HEAD WITHOUT CONTRAST TECHNIQUE: Contiguous axial images were obtained from the base of the skull through the vertex without intravenous contrast. COMPARISON:  Head CT scan 11/25/2018. FINDINGS: Brain: No evidence of acute infarction, hemorrhage, hydrocephalus, extra-axial collection or mass lesion/mass effect. Vascular: No hyperdense vessel or unexpected calcification. Skull: No fracture or focal lesion. Sinuses/Orbits: Negative. Other: None. IMPRESSION: Normal head CT. Electronically Signed   By: Inge Rise M.D.   On: 12/06/2018 10:36   US Pelvis (transabdominal Only)  Result Date: 12/05/2018 CLINICAL DATA:  Vaginal bleeding. EXAM: TRANSABDOMINAL ULTRASOUND OF PELVIS TECHNIQUE: Transabdominal ultrasound examination of the pelvis was performed including evaluation of the uterus, ovaries, adnexal regions, and pelvic cul-de-sac. COMPARISON:  CT, 11/27/2018 FINDINGS: Uterus Measurements: 9.4 x 3.9 x 6.2 cm = volume: 117 mL. No fibroids or other mass visualized. Endometrium Thickness: 13 mm.  No focal abnormality visualized. Right ovary Measurements: 4.5 x 2.6 x 3.4 cm = volume: 29 mL. Prominent, but otherwise unremarkable. Normal vascular flow noted on Doppler analysis. No adnexal masses. Left ovary Not definitively seen.  No left adnexal masses. Other findings:  No abnormal free fluid. IMPRESSION: 1. No acute findings. 2. No endometrial masses or thickening.  3. Left ovary not visualized.  No adnexal masses.  Electronically Signed   By: Lajean Manes M.D.   On: 12/05/2018 19:12    Scheduled Meds: . Chlorhexidine Gluconate Cloth  6 each Topical Daily  . levothyroxine  75 mcg Oral Daily  . mouth rinse  15 mL Mouth Rinse BID  . multivitamin with minerals  1 tablet Oral Daily  . nebivolol  10 mg Oral Daily  . nystatin  5 mL Oral QID  . vancomycin  125 mg Oral QID    Continuous Infusions: . sodium chloride 1,000 mL (11/30/18 1420)  . sodium chloride    . dextrose 5 % and 0.45% NaCl 100 mL/hr at 12/06/18 1112     Flora Lipps, MD  [facility] 12/06/2018

## 2018-12-07 LAB — COMPREHENSIVE METABOLIC PANEL
ALT: 50 U/L — ABNORMAL HIGH (ref 0–44)
AST: 51 U/L — AB (ref 15–41)
Albumin: 1.9 g/dL — ABNORMAL LOW (ref 3.5–5.0)
Alkaline Phosphatase: 421 U/L — ABNORMAL HIGH (ref 38–126)
Anion gap: 9 (ref 5–15)
BUN: 10 mg/dL (ref 6–20)
CHLORIDE: 97 mmol/L — AB (ref 98–111)
CO2: 32 mmol/L (ref 22–32)
Calcium: 9 mg/dL (ref 8.9–10.3)
Creatinine, Ser: 0.64 mg/dL (ref 0.44–1.00)
GFR calc Af Amer: >60 mL/min (ref >60)
GFR calc non Af Amer: >60 mL/min (ref >60)
Glucose, Bld: 110 mg/dL — ABNORMAL HIGH (ref 70–99)
Potassium: 3 mmol/L — ABNORMAL LOW (ref 3.5–5.1)
Sodium: 138 mmol/L (ref 135–145)
Total Bilirubin: 2.1 mg/dL — ABNORMAL HIGH (ref 0.3–1.2)
Total Protein: 5 g/dL — ABNORMAL LOW (ref 6.5–8.1)

## 2018-12-07 LAB — CBC
HEMATOCRIT: 25.3 % — AB (ref 36.0–46.0)
Hemoglobin: 7.7 g/dL — ABNORMAL LOW (ref 12.0–15.0)
MCH: 28.3 pg (ref 26.0–34.0)
MCHC: 30.4 g/dL (ref 30.0–36.0)
MCV: 93 fL (ref 80.0–100.0)
Platelets: 224 10*3/uL (ref 150–400)
RBC: 2.72 MIL/uL — ABNORMAL LOW (ref 3.87–5.11)
RDW: 16.8 % — ABNORMAL HIGH (ref 11.5–15.5)
WBC: 5.4 10*3/uL (ref 4.0–10.5)
nRBC: 0.4 % — ABNORMAL HIGH (ref 0.0–0.2)

## 2018-12-07 LAB — PHOSPHORUS: Phosphorus: 2.9 mg/dL (ref 2.5–4.6)

## 2018-12-07 LAB — MAGNESIUM: MAGNESIUM: 1.8 mg/dL (ref 1.7–2.4)

## 2018-12-07 MED ORDER — POTASSIUM CHLORIDE 10 MEQ/100ML IV SOLN
10.0000 meq | INTRAVENOUS | Status: AC
  Administered 2018-12-07 (×5): 10 meq via INTRAVENOUS
  Filled 2018-12-07 (×3): qty 100

## 2018-12-07 MED ORDER — DEXTROSE-NACL 5-0.9 % IV SOLN
INTRAVENOUS | Status: DC
  Administered 2018-12-07 – 2018-12-12 (×7): via INTRAVENOUS

## 2018-12-07 NOTE — Progress Notes (Signed)
Clarified with MD about bladder scan order. Pt with a foley. MD questioned same. Order given to d/c "bladder scan" order.  VWilliams,RN.

## 2018-12-07 NOTE — Progress Notes (Signed)
PROGRESS NOTE  Cindy Ward FBP:[id number] DOB: [birth year] DOA: 11/11/2018 PCP: [name], MD   LOS: 26 days   Brief narrative:  53 year old female with history of essential hypertension, hypothyroidism initially presented to [facility] with complaints of recurrent nausea, vomiting, fever and night sweats.  Upon extensive work-up she was diagnosed with Hodgkin's lymphoma, stage IV.  Oncology team was consulted and patient was transferred here for further care and management.  Lymph node biopsy was consistent with classic Hodgkin's lymphoma.  Hospital course has been complicated by bacteremia, intermittent encephalopathy, severe protein calorie malnutrition and electrolyte imbalance.  She is also been pancytopenic since she is on chemotherapy received Granix.    Subjective:  Patient little more alert and awake today.  She is willing to try to eat something today.  Spoke with the nursing staff about it.    Assessment/Plan:  Principal Problem:   Hodgkin lymphoma (HCC) Active Problems:   Lymphadenopathy   Fever   Splenomegaly   Liver enzyme elevation   Thrombocytopenia (HCC)   Unintentional weight loss   Hashimoto's thyroiditis   Goiter   Hypertension   Aortic atherosclerosis (HCC)   Pancytopenia (HCC)   Malnutrition of moderate degree   Normocytic anemia   Neutropenic fever (HCC)   Oral thrush   Enteritis due to Clostridium difficile   Streptococcal bacteremia   Palliative care by specialist   Goals of care, counseling/discussion   Total bilirubin, elevated  Stage IV Hodgkin's lymphoma, new diagnosis  Patient underwent port placement on 2/24 and chemotherapy on 2/25.  Plans for second round of chemotherapy outpatient when patient is much stronger and total bilirubin is at least below 2.0 per Dr Maylon Peppers, oncology but her functional capacity continues to decline with poor nutrition.  Patient might not be a candidate for further chemotherapy in the future due to  functional decline.  Intermittent encephalopathy likely metabolic in nature. Off scopolamine patch.  Patient is more alert awake today.  Fever despite being on treatment for C. difficile with p.o. vancomycin and IV Rocephin for E. coli bacteremia.  Repeat urine analysis was negative for infection.  Currently on Foley catheter.  Chest x-ray was negative for any acute infiltrate.  Left breast erythema without induration.  No signs of localized induration or abscess.  Closely monitor.  Hypokalemia Patient is persistently hypokalemic.  We will continue with IV runs.  Magnesium supplements.    Pancytopenia with mild rectal/vaginal bleeding.  Resolved.  Hemoglobin 7.7.  Platelet count is 144 today.  We will continue to monitor closely.   ultrasound of the pelvis not reveal any uterine pathology.  Sinus tachycardia; greatly improved with hydration and beta-blockers.  Bacteremia with E. coli and group B strep with neutropenia C. difficile colitis, improved. Completed Rocephin 10/10.  Oral vancomycin 10/14 as per infectious disease recommendation.  Repeat blood culture from 12/06/2018 is pending.  T-max of 103.1 F yesterday.  Elevated Total Bilirubin; possible due to hepatotoxic meds -CT abdomen pelvis was negative for any acute pathology including right upper quadrant ultrasound.   Trending down bilirubin.  Severe protein calorie malnutrition -Poor oral intake.   encouraged oral intake.  continue D5 normal saline for now.  Acute urinary retention with bilateral hydroureteronephrosis Failed intermittent catheterization.  Urology was consulted.  On foley. Off scopolamine patch  Mild dysphagia with oral thrush -  Continue statin  Hypothyroidism -Continue Synthroid  Generalized debility - PT on board.  Recommended skilled nursing facility placement but patient does not have payor source for  rehabilitation. Medicaid application pending.   VTE Prophylaxis: SCD  Code Status: Full code   Family Communication: None today  Disposition Plan: Difficult disposition at this time.  Continue to monitor blood progress.  Encourage oral nutrition and ambulation.  Spoke with the nursing staff.  Poor prognosis due to rapid functional decline lack of ambulation poor oral intake and progressive Hodgkin's disease.  Consultants:  Pulmonary, oncology, GI  Procedures:  Bx 2/21  Port cath 2/24  Antibiotics: Anti-infectives (From admission, onward)   Start     Dose/Rate Route Frequency Ordered Stop   11/27/18 1130  vancomycin (VANCOCIN) 50 mg/mL oral solution 125 mg     125 mg Oral 4 times daily 11/27/18 1051 12/10/18 2359   11/27/18 0915  metroNIDAZOLE (FLAGYL) IVPB 500 mg  Status:  Discontinued     500 mg 100 mL/hr over 60 Minutes Intravenous Every 8 hours 11/27/18 0912 11/27/18 1051   11/26/18 0200  cefTRIAXone (ROCEPHIN) 2 g in sodium chloride 0.9 % 100 mL IVPB     2 g 200 mL/hr over 30 Minutes Intravenous Daily at bedtime 11/26/18 0059 12/06/18 0700   11/25/18 1200  fluconazole (DIFLUCAN) IVPB 200 mg  Status:  Discontinued     200 mg 100 mL/hr over 60 Minutes Intravenous Every 24 hours 11/25/18 0836 12/02/18 1255   11/25/18 1000  vancomycin (VANCOCIN) IVPB 1000 mg/200 mL premix  Status:  Discontinued     1,000 mg 200 mL/hr over 60 Minutes Intravenous Every 12 hours 11/25/18 0848 11/26/18 0827   11/25/18 1000  ceFEPIme (MAXIPIME) 2 g in sodium chloride 0.9 % 100 mL IVPB  Status:  Discontinued     2 g 200 mL/hr over 30 Minutes Intravenous Every 8 hours 11/25/18 0848 11/26/18 0058   11/25/18 0900  vancomycin (VANCOCIN) 1,500 mg in sodium chloride 0.9 % 500 mL IVPB     1,500 mg 250 mL/hr over 120 Minutes Intravenous  Once 11/25/18 0848 11/25/18 1135   11/24/18 1500  fluconazole (DIFLUCAN) tablet 200 mg  Status:  Discontinued     200 mg Oral Daily 11/24/18 1450 11/25/18 0836   11/18/18 0600  ceFAZolin (ANCEF) IVPB 2g/100 mL premix     2 g 200 mL/hr over 30 Minutes Intravenous To  Radiology 11/17/18 1243 11/18/18 1130   11/12/18 0800  cefTRIAXone (ROCEPHIN) 2 g in sodium chloride 0.9 % 100 mL IVPB  Status:  Discontinued     2 g 200 mL/hr over 30 Minutes Intravenous Every 24 hours 11/11/18 1711 11/12/18 1057     Objective: Vitals:   12/07/18 0513 12/07/18 0721  BP: 136/81 114/61  Pulse: 98 99  Resp: 16 16  Temp: 98.4 F (36.9 C) 98.8 F (37.1 C)  SpO2: 96% 98%    Intake/Output Summary (Last 24 hours) at 12/07/2018 1335 Last data filed at 12/07/2018 0629 Gross per 24 hour  Intake 668.23 ml  Output 2150 ml  Net -1481.77 ml   Filed Weights   12/05/18 0550 12/06/18 0500 12/07/18 0513  Weight: 62 kg 59 kg 59.5 kg   Body mass index is 19.94 kg/m.   Physical Exam: General: Thinly built cachectic t, not in obvious distress, more alert awake and communicative today. HENT: Normocephalic,.  Dilated pupils equally reacting to light and accommodation.  Mild pallor noted.  Oral mucosa is moist.  Chest:  Clear breath sounds.  Diminished breath sounds bilaterally. No crackles or wheezes.  Left breast mild erythema. CVS: S1 &S2 heard. No murmur.  Regular rate and  rhythm. Abdomen: Soft, nontender, nondistended.  Bowel sounds are heard.  Liver is not palpable, no abdominal mass palpated Extremities: No cyanosis, clubbing or edema.  Peripheral pulses are palpable. Psych: Alert, awake and communicative today. CNS:  No cranial nerve deficits.  Power equal in all extremities.  Generalized weakness noted.   Skin: Warm and dry.  Left breast erythema..   Data Review: I have personally reviewed the following laboratory data and studies,  CBC: Recent Labs  Lab 12/01/18 0404 12/02/18 0324 12/03/18 0343 12/04/18 0411 12/05/18 0425 12/06/18 0440 12/07/18 0309  WBC 0.6* 0.9* 2.0* 4.1 7.5 7.8 5.4  NEUTROABS 0.2* 0.4* 0.6* 2.4 5.6  --   --   HGB 8.1* 7.3* 8.8* 8.5* 8.3* 8.1* 7.7*  HCT 26.0* 24.1* 27.5* 27.5* 25.9* 26.5* 25.3*  MCV 90.3 90.9 89.3 91.1 90.6 93.3 93.0   PLT 61* 91* 107* 135* 181 211 027   Basic Metabolic Panel: Recent Labs  Lab 12/01/18 0404 12/02/18 0324 12/03/18 0343  12/04/18 0411 12/05/18 0425 12/06/18 0440 12/06/18 1416 12/07/18 0309  NA 143 147* 147*   < > 147* 145 144 140 138  K 3.5 3.4* 2.9*   < > 3.1* 3.0* 3.1* 3.7 3.0*  CL 113* 116* 111   < > 108 102 100 98 97*  CO2 23 25 26    < > 27 29 31  32 32  GLUCOSE 98 95 94   < > 80 81 81 103* 110*  BUN 8 10 11    < > 12 11 10 11 10   CREATININE 0.47 0.50 0.57   < > 0.56 0.60 0.79 0.63 0.64  CALCIUM 7.5* 8.0* 8.6*   < > 9.0 9.5 9.9 9.5 9.0  MG 1.9 1.8 1.7  --  1.8  --  1.7 2.3 1.8  PHOS 2.9 3.2  --   --   --   --   --   --  2.9   < > = values in this interval not displayed.   Liver Function Tests: Recent Labs  Lab 12/03/18 0343 12/04/18 0411 12/05/18 0425 12/06/18 0440 12/07/18 0309  AST 41 45* 60* 59* 51*  ALT 66* 62* 63* 57* 50*  ALKPHOS 132* 363* 710* 613* 421*  BILITOT 4.2* 3.8* 3.5* 2.7* 2.1*  PROT 4.8* 4.8* 5.0* 5.2* 5.0*  ALBUMIN 1.8* 1.7* 1.8* 2.0* 1.9*   No results for input(s): LIPASE, AMYLASE in the last 168 hours. Recent Labs  Lab 12/05/18 1355  AMMONIA 10   Cardiac Enzymes: No results for input(s): CKTOTAL, CKMB, CKMBINDEX, TROPONINI in the last 168 hours. BNP (last 3 results) Recent Labs    11/21/18 1519  BNP 1,731.6*    ProBNP (last 3 results) No results for input(s): PROBNP in the last 8760 hours.  CBG: No results for input(s): GLUCAP in the last 168 hours. Recent Results (from the past 240 hour(s))  Culture, blood (Routine X 2) w Reflex to ID Panel     Status: None   Collection Time: 11/28/18 12:19 AM  Result Value Ref Range Status   Specimen Description   Final    BLOOD RIGHT HAND Performed at [facility] [address] [place], [place] [address]    Special Requests   Final    BOTTLES DRAWN AEROBIC AND ANAEROBIC Blood Culture adequate volume Performed at [facility] [address]  [place], [place] [address]    Culture   Final    NO GROWTH 5 DAYS Performed at [facility], [place] [address], [place], [place] [address]    Report Status 12/03/2018 FINAL  Final  Culture, blood (Routine X 2) w Reflex to ID Panel     Status: None   Collection Time: 11/28/18 12:19 AM  Result Value Ref Range Status   Specimen Description   Final    BLOOD LEFT HAND Performed at [facility] [address] [place], [place] [address]    Special Requests   Final    BOTTLES DRAWN AEROBIC AND ANAEROBIC Blood Culture adequate volume Performed at [facility] [address] [place], [place] [address]    Culture   Final    NO GROWTH 5 DAYS Performed at [facility], [place] [address], [place], [place] [address]    Report Status 12/03/2018 FINAL  Final     Studies: Ct Head Wo Contrast  Result Date: 12/06/2018 CLINICAL DATA:  Confusion, altered mental status. EXAM: CT HEAD WITHOUT CONTRAST TECHNIQUE: Contiguous axial images were obtained from the base of the skull through the vertex without intravenous contrast. COMPARISON:  Head CT scan 11/25/2018. FINDINGS: Brain: No evidence of acute infarction, hemorrhage, hydrocephalus, extra-axial collection or mass lesion/mass effect. Vascular: No hyperdense vessel or unexpected calcification. Skull: No fracture or focal lesion. Sinuses/Orbits: Negative. Other: None. IMPRESSION: Normal head CT. Electronically Signed   By: Inge Rise M.D.   On: 12/06/2018 10:36   Dg Chest Port 1 View  Result Date: 12/06/2018 CLINICAL DATA:  53 year old female with fever, not responding to questions EXAM: PORTABLE CHEST 1 VIEW COMPARISON:  Prior chest x-ray 11/26/2018 FINDINGS: Right IJ approach single-lumen power injectable port catheter. The catheter tip overlies the mid SVC. The lungs are clear and negative for focal airspace consolidation, pulmonary edema or suspicious pulmonary nodule. No pleural effusion or pneumothorax.  Cardiac and mediastinal contours are within normal limits. No acute fracture or lytic or blastic osseous lesions. The visualized upper abdominal bowel gas pattern is unremarkable. IMPRESSION: No active disease. Electronically Signed   By: Jacqulynn Cadet M.D.   On: 12/06/2018 13:42    Scheduled Meds: . Chlorhexidine Gluconate Cloth  6 each Topical Daily  . levothyroxine  75 mcg Oral Daily  . mouth rinse  15 mL Mouth Rinse BID  . multivitamin with minerals  1 tablet Oral Daily  . nebivolol  10 mg Oral Daily  . nystatin  5 mL Oral QID  . vancomycin  125 mg Oral QID    Continuous Infusions: . sodium chloride 1,000 mL (11/30/18 1420)  . sodium chloride    . potassium chloride 10 mEq (12/07/18 1242)     Flora Lipps, MD  [facility] 12/07/2018

## 2018-12-08 ENCOUNTER — Other Ambulatory Visit: Payer: Self-pay

## 2018-12-08 LAB — BASIC METABOLIC PANEL
Anion gap: 9 (ref 5–15)
BUN: 9 mg/dL (ref 6–20)
CO2: 28 mmol/L (ref 22–32)
Calcium: 8.1 mg/dL — ABNORMAL LOW (ref 8.9–10.3)
Chloride: 100 mmol/L (ref 98–111)
Creatinine, Ser: 0.58 mg/dL (ref 0.44–1.00)
GFR calc non Af Amer: >60 mL/min (ref >60)
Glucose, Bld: 95 mg/dL (ref 70–99)
Potassium: 3.1 mmol/L — ABNORMAL LOW (ref 3.5–5.1)
Sodium: 137 mmol/L (ref 135–145)

## 2018-12-08 LAB — CBC
HCT: 25.5 % — ABNORMAL LOW (ref 36.0–46.0)
Hemoglobin: 7.7 g/dL — ABNORMAL LOW (ref 12.0–15.0)
MCH: 28.4 pg (ref 26.0–34.0)
MCHC: 30.2 g/dL (ref 30.0–36.0)
MCV: 94.1 fL (ref 80.0–100.0)
Platelets: 236 10*3/uL (ref 150–400)
RBC: 2.71 MIL/uL — ABNORMAL LOW (ref 3.87–5.11)
RDW: 16.8 % — ABNORMAL HIGH (ref 11.5–15.5)
WBC: 4.5 10*3/uL (ref 4.0–10.5)
nRBC: 0 % (ref 0.0–0.2)

## 2018-12-08 LAB — MAGNESIUM: Magnesium: 1.7 mg/dL (ref 1.7–2.4)

## 2018-12-08 MED ORDER — MAGNESIUM SULFATE 2 GM/50ML IV SOLN
2.0000 g | Freq: Once | INTRAVENOUS | Status: AC
  Administered 2018-12-08: 2 g via INTRAVENOUS
  Filled 2018-12-08: qty 50

## 2018-12-08 MED ORDER — POTASSIUM CHLORIDE 10 MEQ/100ML IV SOLN
10.0000 meq | INTRAVENOUS | Status: AC
  Administered 2018-12-08 (×4): 10 meq via INTRAVENOUS
  Filled 2018-12-08 (×4): qty 100

## 2018-12-08 NOTE — Progress Notes (Addendum)
PROGRESS NOTE  Cindy Ward VQM:[id number] DOB: 1966/04/19 DOA: 11/11/2018 PCP: [name], MD   LOS: 27 days   Brief narrative:  53 year old female with history of essential hypertension, hypothyroidism initially presented to [facility] with complaints of recurrent nausea, vomiting, fever and night sweats.  Upon extensive work-up, she was diagnosed with Hodgkin's lymphoma, stage IV.  Oncology team was consulted and patient was transferred here for further care and management.  Lymph node biopsy was consistent with classic Hodgkin's lymphoma.  Hospital course has been complicated by bacteremia, intermittent encephalopathy, severe protein calorie malnutrition and electrolyte imbalance.    Subjective:  Patient is much more alert awake and communicative today.  She stated that she had something to eat this morning.  She is willing to work with physical therapy.  Assessment/Plan:  Principal Problem:   Hodgkin lymphoma (HCC) Active Problems:   Lymphadenopathy   Fever   Splenomegaly   Liver enzyme elevation   Thrombocytopenia (HCC)   Unintentional weight loss   Hashimoto's thyroiditis   Goiter   Hypertension   Aortic atherosclerosis (HCC)   Pancytopenia (HCC)   Malnutrition of moderate degree   Normocytic anemia   Neutropenic fever (HCC)   Oral thrush   Enteritis due to Clostridium difficile   Streptococcal bacteremia   Palliative care by specialist   Goals of care, counseling/discussion   Total bilirubin, elevated  Stage IV Hodgkin's lymphoma, new diagnosis  Patient underwent port placement on 2/24 and chemotherapy on 2/25.  Plans for second round of chemotherapy outpatient when patient is much stronger and total bilirubin is at least below 2.0 per Dr Maylon Peppers, oncology.  She has shown some improvement over the last 2 days.  We will continue to focus on nutrition and ambulation per the patient.  Intermittent encephalopathy likely metabolic in nature. Off scopolamine  patch.  She is alert awake oriented today.  Fever despite being on treatment for C. difficile with p.o. vancomycin and IV Rocephin for E. coli bacteremia.  Further episodes of diarrhea.  Repeat blood cultures are negative in 1 day.  T-max of 99.6 F.  Will not escalate with antibiotic especially with the context of current C. difficile.  Repeat urine analysis was negative for infection.  Currently on Foley catheter.  Chest x-ray was negative for any acute infiltrate.  Will try to discontinue Foley catheter when possible.  Left breast erythema without induration.  No signs of localized induration or abscess.  Closely monitor.  She does have large lymph node on the left axillary region status post biopsy.  No evidence of infection at this time.  Hypokalemia Patient is persistently hypokalemic secondary to poor oral intake..  We will continue with IV runs.  Magnesium supplements.  Pancytopenia with mild rectal/vaginal bleeding.  Resolved.  Hemoglobin 7.7.  Platelet count is 236  today.  We will continue to monitor closely.   ultrasound of the pelvis not reveal any uterine pathology.  Sinus tachycardia; greatly improved with hydration and beta-blockers.  Bacteremia with E. coli and group B strep with neutropenia C. difficile colitis, improved. Completed Rocephin IV as per infectious disease recommendation.  Oral vancomycin 11/14 as per infectious disease recommendation.  Repeat blood culture from 12/06/2018 with no growth in 1 day.   Elevated Total Bilirubin; possible due to hepatotoxic meds -CT abdomen pelvis was negative for any acute pathology including right upper quadrant ultrasound.   Trending down bilirubin.  Check CMP in a.m.  Severe protein calorie malnutrition Has had better appetite yesterday and  today.  We will continue to encourage oral nutrition.  Will decrease the rate of D5 normal saline today.  Continue nutritional supplements.  Acute urinary retention with bilateral  hydroureteronephrosis Failed intermittent catheterization.  Urology was consulted.  On foley. Off scopolamine patch  Mild dysphagia with oral thrush -  Continue statin  Hypothyroidism -Continue Synthroid  Generalized debility - PT on board.  Recommended skilled nursing facility placement but patient does not have payor source for rehabilitation. Medicaid application pending.  Continue to ambulate the patient.  Patient will likely go home on discharge.  VTE Prophylaxis: SCD  Code Status: Full code  Family Communication: I spoke with the patient's her daughter with Ms Anderson Malta on the phone and updated her about the clinical condition of the patient.  Disposition Plan: Denies some improvement in her mental capacity nutrition.  We will continue to encourage oral nutrition.  We will ambulate the patient as possible.  If the patient continues to improve, plan is disposition home.  Consultants:  Pulmonary, oncology, GI  Procedures:  Bx 2/21  Port cath 2/24  Antibiotics: Anti-infectives (From admission, onward)   Start     Dose/Rate Route Frequency Ordered Stop   11/27/18 1130  vancomycin (VANCOCIN) 50 mg/mL oral solution 125 mg     125 mg Oral 4 times daily 11/27/18 1051 12/10/18 2359   11/27/18 0915  metroNIDAZOLE (FLAGYL) IVPB 500 mg  Status:  Discontinued     500 mg 100 mL/hr over 60 Minutes Intravenous Every 8 hours 11/27/18 0912 11/27/18 1051   11/26/18 0200  cefTRIAXone (ROCEPHIN) 2 g in sodium chloride 0.9 % 100 mL IVPB     2 g 200 mL/hr over 30 Minutes Intravenous Daily at bedtime 11/26/18 0059 12/06/18 0700   11/25/18 1200  fluconazole (DIFLUCAN) IVPB 200 mg  Status:  Discontinued     200 mg 100 mL/hr over 60 Minutes Intravenous Every 24 hours 11/25/18 0836 12/02/18 1255   11/25/18 1000  vancomycin (VANCOCIN) IVPB 1000 mg/200 mL premix  Status:  Discontinued     1,000 mg 200 mL/hr over 60 Minutes Intravenous Every 12 hours 11/25/18 0848 11/26/18 0827   11/25/18 1000   ceFEPIme (MAXIPIME) 2 g in sodium chloride 0.9 % 100 mL IVPB  Status:  Discontinued     2 g 200 mL/hr over 30 Minutes Intravenous Every 8 hours 11/25/18 0848 11/26/18 0058   11/25/18 0900  vancomycin (VANCOCIN) 1,500 mg in sodium chloride 0.9 % 500 mL IVPB     1,500 mg 250 mL/hr over 120 Minutes Intravenous  Once 11/25/18 0848 11/25/18 1135   11/24/18 1500  fluconazole (DIFLUCAN) tablet 200 mg  Status:  Discontinued     200 mg Oral Daily 11/24/18 1450 11/25/18 0836   11/18/18 0600  ceFAZolin (ANCEF) IVPB 2g/100 mL premix     2 g 200 mL/hr over 30 Minutes Intravenous To Radiology 11/17/18 1243 11/18/18 1130   11/12/18 0800  cefTRIAXone (ROCEPHIN) 2 g in sodium chloride 0.9 % 100 mL IVPB  Status:  Discontinued     2 g 200 mL/hr over 30 Minutes Intravenous Every 24 hours 11/11/18 1711 11/12/18 1057     Objective: Vitals:   12/07/18 2315 12/08/18 0532  BP: 110/66 127/67  Pulse: 92 90  Resp: 20 20  Temp: 99.2 F (37.3 C) 99.6 F (37.6 C)  SpO2: 99% 99%    Intake/Output Summary (Last 24 hours) at 12/08/2018 0900 Last data filed at 12/08/2018 9381 Gross per 24 hour  Intake 830.05  ml  Output 2150 ml  Net -1319.95 ml   Filed Weights   12/06/18 0500 12/07/18 0513 12/08/18 0500  Weight: 59 kg 59.5 kg 62.5 kg   Body mass index is 20.95 kg/m.   Physical Exam: General:  Average built, not in obvious distress, thinly built.  Alert awake and communicative. HENT: Normocephalic, pupils equally reacting to light and accommodation.  Mild pallor noted.. Oral mucosa is moist.  Chest:  Clear breath sounds.  Diminished breath sounds bilaterally. No crackles or wheezes.  Axillary area with a lymph node.  Left breast erythema improved CVS: S1 &S2 heard. No murmur.  Regular rate and rhythm. Abdomen: Soft, nontender, nondistended.  Bowel sounds are heard.  Liver is not palpable, no abdominal mass palpated Extremities: No cyanosis, clubbing or edema.  Peripheral pulses are palpable. Psych: Alert,  awake and oriented, normal mood CNS:  No cranial nerve deficits.  Power equal in all extremities.  No sensory deficits noted.  No cerebellar signs.   Skin: Warm and dry.     Data Review: I have personally reviewed the following laboratory data and studies,  CBC: Recent Labs  Lab 12/02/18 0324 12/03/18 0343 12/04/18 0411 12/05/18 0425 12/06/18 0440 12/07/18 0309 12/08/18 0413  WBC 0.9* 2.0* 4.1 7.5 7.8 5.4 4.5  NEUTROABS 0.4* 0.6* 2.4 5.6  --   --   --   HGB 7.3* 8.8* 8.5* 8.3* 8.1* 7.7* 7.7*  HCT 24.1* 27.5* 27.5* 25.9* 26.5* 25.3* 25.5*  MCV 90.9 89.3 91.1 90.6 93.3 93.0 94.1  PLT 91* 107* 135* 181 211 224 867   Basic Metabolic Panel: Recent Labs  Lab 12/02/18 0324  12/04/18 0411 12/05/18 0425 12/06/18 0440 12/06/18 1416 12/07/18 0309 12/08/18 0413  NA 147*   < > 147* 145 144 140 138 137  K 3.4*   < > 3.1* 3.0* 3.1* 3.7 3.0* 3.1*  CL 116*   < > 108 102 100 98 97* 100  CO2 25   < > 27 29 31  32 32 28  GLUCOSE 95   < > 80 81 81 103* 110* 95  BUN 10   < > 12 11 10 11 10 9   CREATININE 0.50   < > 0.56 0.60 0.79 0.63 0.64 0.58  CALCIUM 8.0*   < > 9.0 9.5 9.9 9.5 9.0 8.1*  MG 1.8   < > 1.8  --  1.7 2.3 1.8 1.7  PHOS 3.2  --   --   --   --   --  2.9  --    < > = values in this interval not displayed.   Liver Function Tests: Recent Labs  Lab 12/03/18 0343 12/04/18 0411 12/05/18 0425 12/06/18 0440 12/07/18 0309  AST 41 45* 60* 59* 51*  ALT 66* 62* 63* 57* 50*  ALKPHOS 132* 363* 710* 613* 421*  BILITOT 4.2* 3.8* 3.5* 2.7* 2.1*  PROT 4.8* 4.8* 5.0* 5.2* 5.0*  ALBUMIN 1.8* 1.7* 1.8* 2.0* 1.9*   No results for input(s): LIPASE, AMYLASE in the last 168 hours. Recent Labs  Lab 12/05/18 1355  AMMONIA 10   Cardiac Enzymes: No results for input(s): CKTOTAL, CKMB, CKMBINDEX, TROPONINI in the last 168 hours. BNP (last 3 results) Recent Labs    11/21/18 1519  BNP 1,731.6*    ProBNP (last 3 results) No results for input(s): PROBNP in the last 8760  hours.  CBG: No results for input(s): GLUCAP in the last 168 hours. Recent Results (from the past 240 hour(s))  Culture,  blood (routine x 2)     Status: None (Preliminary result)   Collection Time: 12/06/18  1:00 PM  Result Value Ref Range Status   Specimen Description   Final    BLOOD RIGHT ARM Performed at [facility], [place] [address], [place], [place] [address]    Special Requests   Final    BOTTLES DRAWN AEROBIC ONLY Blood Culture results may not be optimal due to an inadequate volume of blood received in culture bottles Performed at [place] [address], [place], [place] [address]    Culture   Final    NO GROWTH 1 DAY Performed at [facility], [place] [address] [place], [place] [address]    Report Status PENDING  Incomplete  Culture, blood (routine x 2)     Status: None (Preliminary result)   Collection Time: 12/06/18  1:00 PM  Result Value Ref Range Status   Specimen Description   Final    BLOOD LEFT ARM Performed at [facility], [place] [address], [place], [place] [address]    Special Requests   Final    BOTTLES DRAWN AEROBIC ONLY Blood Culture results may not be optimal due to an inadequate volume of blood received in culture bottles Performed at [facility] [address] [place], [place] [address]    Culture   Final    NO GROWTH 1 DAY Performed at [facility], [place] [address] [place], [place] [address]    Report Status PENDING  Incomplete     Studies: Ct Head Wo Contrast  Result Date: 12/06/2018 CLINICAL DATA:  Confusion, altered mental status. EXAM: CT HEAD WITHOUT CONTRAST TECHNIQUE: Contiguous axial images were obtained from the base of the skull through the vertex without intravenous contrast. COMPARISON:  Head CT scan 11/25/2018. FINDINGS: Brain: No evidence of acute infarction, hemorrhage, hydrocephalus, extra-axial collection or mass lesion/mass effect. Vascular: No hyperdense vessel or  unexpected calcification. Skull: No fracture or focal lesion. Sinuses/Orbits: Negative. Other: None. IMPRESSION: Normal head CT. Electronically Signed   By: Inge Rise M.D.   On: 12/06/2018 10:36   Dg Chest Port 1 View  Result Date: 12/06/2018 CLINICAL DATA:  53 year old female with fever, not responding to questions EXAM: PORTABLE CHEST 1 VIEW COMPARISON:  Prior chest x-ray 11/26/2018 FINDINGS: Right IJ approach single-lumen power injectable port catheter. The catheter tip overlies the mid SVC. The lungs are clear and negative for focal airspace consolidation, pulmonary edema or suspicious pulmonary nodule. No pleural effusion or pneumothorax. Cardiac and mediastinal contours are within normal limits. No acute fracture or lytic or blastic osseous lesions. The visualized upper abdominal bowel gas pattern is unremarkable. IMPRESSION: No active disease. Electronically Signed   By: Jacqulynn Cadet M.D.   On: 12/06/2018 13:42    Scheduled Meds:  Chlorhexidine Gluconate Cloth  6 each Topical Daily   levothyroxine  75 mcg Oral Daily   mouth rinse  15 mL Mouth Rinse BID   multivitamin with minerals  1 tablet Oral Daily   nebivolol  10 mg Oral Daily   nystatin  5 mL Oral QID   vancomycin  125 mg Oral QID    Continuous Infusions:  sodium chloride 1,000 mL (11/30/18 1420)   sodium chloride     dextrose 5 % and 0.9% NaCl 75 mL/hr at 12/08/18 0327   magnesium sulfate 1 - 4 g bolus IVPB     potassium chloride  Flora Lipps, MD  [facility] 12/08/2018

## 2018-12-09 LAB — COMPREHENSIVE METABOLIC PANEL
ALT: 43 U/L (ref 0–44)
AST: 58 U/L — AB (ref 15–41)
Albumin: 2.1 g/dL — ABNORMAL LOW (ref 3.5–5.0)
Alkaline Phosphatase: 386 U/L — ABNORMAL HIGH (ref 38–126)
Anion gap: 7 (ref 5–15)
BILIRUBIN TOTAL: 1.8 mg/dL — AB (ref 0.3–1.2)
BUN: 7 mg/dL (ref 6–20)
CO2: 27 mmol/L (ref 22–32)
Calcium: 8 mg/dL — ABNORMAL LOW (ref 8.9–10.3)
Chloride: 105 mmol/L (ref 98–111)
Creatinine, Ser: 0.62 mg/dL (ref 0.44–1.00)
GFR calc Af Amer: >60 mL/min (ref >60)
GFR calc non Af Amer: >60 mL/min (ref >60)
GLUCOSE: 94 mg/dL (ref 70–99)
Potassium: 3.3 mmol/L — ABNORMAL LOW (ref 3.5–5.1)
Sodium: 139 mmol/L (ref 135–145)
TOTAL PROTEIN: 5.3 g/dL — AB (ref 6.5–8.1)

## 2018-12-09 LAB — DIC (DISSEMINATED INTRAVASCULAR COAGULATION)PANEL
D-Dimer, Quant: 2.34 ug/mL-FEU — ABNORMAL HIGH (ref 0.00–0.50)
Fibrinogen: 549 mg/dL — ABNORMAL HIGH (ref 210–475)
INR: 1 (ref 0.8–1.2)
Platelets: 285 10*3/uL (ref 150–400)
Prothrombin Time: 13.1 seconds (ref 11.4–15.2)
aPTT: 32 seconds (ref 24–36)

## 2018-12-09 LAB — CBC
HEMATOCRIT: 26 % — AB (ref 36.0–46.0)
Hemoglobin: 7.9 g/dL — ABNORMAL LOW (ref 12.0–15.0)
MCH: 28.6 pg (ref 26.0–34.0)
MCHC: 30.4 g/dL (ref 30.0–36.0)
MCV: 94.2 fL (ref 80.0–100.0)
Platelets: 269 10*3/uL (ref 150–400)
RBC: 2.76 MIL/uL — ABNORMAL LOW (ref 3.87–5.11)
RDW: 16.9 % — AB (ref 11.5–15.5)
WBC: 5.3 10*3/uL (ref 4.0–10.5)
nRBC: 0 % (ref 0.0–0.2)

## 2018-12-09 LAB — C-REACTIVE PROTEIN: CRP: 12.1 mg/dL — ABNORMAL HIGH (ref <1.0)

## 2018-12-09 LAB — MAGNESIUM: Magnesium: 2.4 mg/dL (ref 1.7–2.4)

## 2018-12-09 LAB — DIC (DISSEMINATED INTRAVASCULAR COAGULATION) PANEL: SMEAR REVIEW: NONE SEEN

## 2018-12-09 LAB — LACTATE DEHYDROGENASE: LDH: 364 U/L — ABNORMAL HIGH (ref 98–192)

## 2018-12-09 MED ORDER — POTASSIUM CHLORIDE 10 MEQ/100ML IV SOLN
10.0000 meq | INTRAVENOUS | Status: DC
  Filled 2018-12-09 (×4): qty 100

## 2018-12-09 MED ORDER — POTASSIUM CHLORIDE 10 MEQ/100ML IV SOLN
10.0000 meq | INTRAVENOUS | Status: AC
  Administered 2018-12-09 (×4): 10 meq via INTRAVENOUS

## 2018-12-09 NOTE — Plan of Care (Signed)
Pt taking in very little PO.

## 2018-12-09 NOTE — Progress Notes (Signed)
PROGRESS NOTE  Cindy Ward [id number]:[id number] DOB: 1966/08/25 DOA: 11/11/2018 PCP: [name], MD   LOS: 28 days   Brief narrative:  53 year old female with history of essential hypertension, hypothyroidism initially presented to [facility] with complaints of recurrent nausea, vomiting, fever and night sweats.  Upon extensive work-up, she was diagnosed with Hodgkin's lymphoma, stage IV.  Oncology team was consulted and patient was transferred here for further care and management.  Lymph node biopsy was consistent with classic Hodgkin's lymphoma.  Hospital course has been complicated by bacteremia, intermittent encephalopathy, severe protein calorie malnutrition and electrolyte imbalance.    Subjective:  Patient continues to be more alert awake and communicative.  She still does not have good appetite is improving.  She felt little dizzy when she walked inside the room.  Assessment/Plan:  Principal Problem:   Hodgkin lymphoma (HCC) Active Problems:   Lymphadenopathy   Fever   Splenomegaly   Liver enzyme elevation   Thrombocytopenia (HCC)   Unintentional weight loss   Hashimoto's thyroiditis   Goiter   Hypertension   Aortic atherosclerosis (HCC)   Pancytopenia (HCC)   Malnutrition of moderate degree   Normocytic anemia   Neutropenic fever (HCC)   Oral thrush   Enteritis due to Clostridium difficile   Streptococcal bacteremia   Palliative care by specialist   Goals of care, counseling/discussion   Total bilirubin, elevated  Stage IV Hodgkin's lymphoma, new diagnosis  Patient underwent port placement on 2/24 and chemotherapy on 2/25.  Plans for second round of chemotherapy outpatient when patient is much stronger and total bilirubin is at least below 2.0 per Dr Maylon Peppers, oncology.      Intermittent metabolic encephalopathy . Off scopolamine patch.  Much improved at this time.  Fever.  T Max of 98.7 F.  continue p.o. vancomycin.  Completed IV Rocephin for E. coli  bacteremia.  Blood cultures from 12/06/2018 are negative so far.   Repeat urine analysis was negative for infection.  Currently on Foley catheter, will consider voiding trial in a.m.[place] Kitchen  Chest x-ray was negative for any acute infiltrate.    Left breast erythema without induration.  No signs of localized induration or abscess. She does have large lymph node on the left axillary region status post biopsy.  No evidence of infection at this time.  Hypokalemia Continue to replenish potassium.  Pancytopenia with mild rectal/vaginal bleeding.  Resolved.  Hemoglobin 7.9.   ultrasound of the pelvis not reveal any uterine pathology.  Transfuse 1 unit of packed RBC if hemoglobin less than 8 as per hemato-oncology recommendation.  Sinus tachycardia; greatly improved with hydration and beta-blockers.  Bacteremia with E. coli and group B strep with neutropenia C. difficile colitis, improved. Completed Rocephin IV,  Oral vancomycin 12/14 as per infectious disease recommendation.  Repeat blood culture from 12/06/2018 with no growth in 1 day.   Elevated Total Bilirubin; possible due to hepatotoxic meds -CT abdomen pelvis was negative for any acute pathology including right upper quadrant ultrasound.   Bilirubin has trended down to 1.8 today.  Severe protein calorie malnutrition onf D5 normal saline encourage oral nutrition  Acute urinary retention with bilateral hydroureteronephrosis Failed intermittent catheterization.  Urology was consulted.  On foley. Off scopolamine patch, consider voiding trial in a.m.  Mild dysphagia with oral thrush -  Continue statin  Hypothyroidism -Continue Synthroid  Generalized debility - PT on board.  Recommended skilled nursing facility placement but patient does not have payor source for rehabilitation. Medicaid application pending.  Continue to  ambulate the patient.  Patient will likely go home on discharge likely tomorrow.  VTE Prophylaxis: SCD  Code Status: Full  code  Family Communication: I been spoke with the patient's her daughter with Ms Anderson Malta on the phone today and updated her about the clinical condition of the patient.  Disposition Plan: If the patient continues to improve, plan is disposition home.  Consultants:  Pulmonary, oncology, GI  Procedures:  Bx 2/21  Port cath 2/24  Antibiotics: Anti-infectives (From admission, onward)   Start     Dose/Rate Route Frequency Ordered Stop   11/27/18 1130  vancomycin (VANCOCIN) 50 mg/mL oral solution 125 mg     125 mg Oral 4 times daily 11/27/18 1051 12/10/18 2359   11/27/18 0915  metroNIDAZOLE (FLAGYL) IVPB 500 mg  Status:  Discontinued     500 mg 100 mL/hr over 60 Minutes Intravenous Every 8 hours 11/27/18 0912 11/27/18 1051   11/26/18 0200  cefTRIAXone (ROCEPHIN) 2 g in sodium chloride 0.9 % 100 mL IVPB     2 g 200 mL/hr over 30 Minutes Intravenous Daily at bedtime 11/26/18 0059 12/06/18 0700   11/25/18 1200  fluconazole (DIFLUCAN) IVPB 200 mg  Status:  Discontinued     200 mg 100 mL/hr over 60 Minutes Intravenous Every 24 hours 11/25/18 0836 12/02/18 1255   11/25/18 1000  vancomycin (VANCOCIN) IVPB 1000 mg/200 mL premix  Status:  Discontinued     1,000 mg 200 mL/hr over 60 Minutes Intravenous Every 12 hours 11/25/18 0848 11/26/18 0827   11/25/18 1000  ceFEPIme (MAXIPIME) 2 g in sodium chloride 0.9 % 100 mL IVPB  Status:  Discontinued     2 g 200 mL/hr over 30 Minutes Intravenous Every 8 hours 11/25/18 0848 11/26/18 0058   11/25/18 0900  vancomycin (VANCOCIN) 1,500 mg in sodium chloride 0.9 % 500 mL IVPB     1,500 mg 250 mL/hr over 120 Minutes Intravenous  Once 11/25/18 0848 11/25/18 1135   11/24/18 1500  fluconazole (DIFLUCAN) tablet 200 mg  Status:  Discontinued     200 mg Oral Daily 11/24/18 1450 11/25/18 0836   11/18/18 0600  ceFAZolin (ANCEF) IVPB 2g/100 mL premix     2 g 200 mL/hr over 30 Minutes Intravenous To Radiology 11/17/18 1243 11/18/18 1130   11/12/18 0800   cefTRIAXone (ROCEPHIN) 2 g in sodium chloride 0.9 % 100 mL IVPB  Status:  Discontinued     2 g 200 mL/hr over 30 Minutes Intravenous Every 24 hours 11/11/18 1711 11/12/18 1057     Objective: Vitals:   12/09/18 0544 12/09/18 1032  BP: 133/79 95/63  Pulse: 88 94  Resp: 18 16  Temp: 98.3 F (36.8 C) 98 F (36.7 C)  SpO2: 99% 99%    Intake/Output Summary (Last 24 hours) at 12/09/2018 1401 Last data filed at 12/09/2018 1025 Gross per 24 hour  Intake 1163.45 ml  Output 400 ml  Net 763.45 ml   Filed Weights   12/06/18 0500 12/07/18 0513 12/08/18 0500  Weight: 59 kg 59.5 kg 62.5 kg   Body mass index is 20.95 kg/m.   Physical Exam: General:   not in obvious distress, thinly built.  Alert awake and communicative. HENT: Normocephalic, pupils equally reacting to light and accommodation.  Mild pallor noted. Oral mucosa is moist.  Chest:  Clear breath sounds.  Diminished breath sounds bilaterally. No crackles or wheezes.  Axillary area with a lymph node.  Left breast erythema improved CVS: S1 &S2 heard. No murmur.  Regular rate and rhythm. Abdomen: Soft, nontender, nondistended.  Bowel sounds are heard.  Liver is not palpable, no abdominal mass palpated Extremities: No cyanosis, clubbing or edema.  Peripheral pulses are palpable. Psych: Alert, awake and communicative. CNS:  No cranial nerve deficits.  Power equal in all extremities.  No sensory deficits noted.  No cerebellar signs.     Data Review: I have personally reviewed the following laboratory data and studies,  CBC: Recent Labs  Lab 12/03/18 0343 12/04/18 0411 12/05/18 0425 12/06/18 0440 12/07/18 0309 12/08/18 0413 12/09/18 0428  WBC 2.0* 4.1 7.5 7.8 5.4 4.5 5.3  NEUTROABS 0.6* 2.4 5.6  --   --   --   --   HGB 8.8* 8.5* 8.3* 8.1* 7.7* 7.7* 7.9*  HCT 27.5* 27.5* 25.9* 26.5* 25.3* 25.5* 26.0*  MCV 89.3 91.1 90.6 93.3 93.0 94.1 94.2  PLT 107* 135* 181 211 224 236 657   Basic Metabolic Panel: Recent Labs  Lab  12/06/18 0440 12/06/18 1416 12/07/18 0309 12/08/18 0413 12/09/18 0428  NA 144 140 138 137 139  K 3.1* 3.7 3.0* 3.1* 3.3*  CL 100 98 97* 100 105  CO2 31 32 32 28 27  GLUCOSE 81 103* 110* 95 94  BUN 10 11 10 9 7   CREATININE 0.79 0.63 0.64 0.58 0.62  CALCIUM 9.9 9.5 9.0 8.1* 8.0*  MG 1.7 2.3 1.8 1.7 2.4  PHOS  --   --  2.9  --   --    Liver Function Tests: Recent Labs  Lab 12/04/18 0411 12/05/18 0425 12/06/18 0440 12/07/18 0309 12/09/18 0428  AST 45* 60* 59* 51* 58*  ALT 62* 63* 57* 50* 43  ALKPHOS 363* 710* 613* 421* 386*  BILITOT 3.8* 3.5* 2.7* 2.1* 1.8*  PROT 4.8* 5.0* 5.2* 5.0* 5.3*  ALBUMIN 1.7* 1.8* 2.0* 1.9* 2.1*   No results for input(s): LIPASE, AMYLASE in the last 168 hours. Recent Labs  Lab 12/05/18 1355  AMMONIA 10   Cardiac Enzymes: No results for input(s): CKTOTAL, CKMB, CKMBINDEX, TROPONINI in the last 168 hours. BNP (last 3 results) Recent Labs    11/21/18 1519  BNP 1,731.6*    ProBNP (last 3 results) No results for input(s): PROBNP in the last 8760 hours.  CBG: No results for input(s): GLUCAP in the last 168 hours. Recent Results (from the past 240 hour(s))  Culture, blood (routine x 2)     Status: None (Preliminary result)   Collection Time: 12/06/18  1:00 PM  Result Value Ref Range Status   Specimen Description   Final    BLOOD RIGHT ARM Performed at [facility], [address], [place], [place] [address]    Special Requests   Final    BOTTLES DRAWN AEROBIC ONLY Blood Culture results may not be optimal due to an inadequate volume of blood received in culture bottles Performed at [facility] [address], [place], [place] [address]    Culture   Final    NO GROWTH 3 DAYS Performed at [facility], [place] [address] [place], [place] [address]    Report Status PENDING  Incomplete  Culture, blood (routine x 2)     Status: None (Preliminary result)   Collection Time: 12/06/18  1:00 PM  Result Value Ref  Range Status   Specimen Description   Final    BLOOD LEFT ARM Performed at [facility], [place] [address], [place], [place] [address]    Special Requests  Final    BOTTLES DRAWN AEROBIC ONLY Blood Culture results may not be optimal due to an inadequate volume of blood received in culture bottles Performed at [facility], [place] [address] [place], [place] [address]    Culture   Final    NO GROWTH 3 DAYS Performed at [facility], [place] [address] [place],  [address]    Report Status PENDING  Incomplete     Studies: No results found.  Scheduled Meds: . Chlorhexidine Gluconate Cloth  6 each Topical Daily  . levothyroxine  75 mcg Oral Daily  . mouth rinse  15 mL Mouth Rinse BID  . multivitamin with minerals  1 tablet Oral Daily  . nebivolol  10 mg Oral Daily  . nystatin  5 mL Oral QID  . vancomycin  125 mg Oral QID    Continuous Infusions: . sodium chloride 1,000 mL (11/30/18 1420)  . sodium chloride    . dextrose 5 % and 0.9% NaCl 50 mL/hr at 12/08/18 1849  . potassium chloride 10 mEq (12/09/18 1311)     Flora Lipps, MD  [facility] 12/09/2018

## 2018-12-09 NOTE — Progress Notes (Signed)
Physical Therapy Treatment Patient Details Name: Cindy Ward MRN: [id number] DOB: Oct 14, 1965 Today's Date: 12/09/2018    History of Present Illness 53 yo female admitted to ED on 11/11/18 for 9 month history of N/V, fatigue, and weight loss. Pt previously followed in [place], oncology reports inconclusive findings. 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 Pt with R iliac BM aspiration and core, awaiting results. Other PMH includes HTN, hypothyroidism.  Course complicated by sepsis and trasnferred to ICU on 11/25/18.    PT Comments    Pt assisted to standing however reported dizziness.  Pt able to march in place however dizziness did not resolve and pt requested return to bed.  BP upon return to supine 117/71 mmHg.  If unable to d/c to SNF, pt may need increased home care.    Follow Up Recommendations  SNF     Equipment Recommendations  None recommended by PT    Recommendations for Other Services       Precautions / Restrictions Precautions Precautions: Fall Precaution Comments: monitor HR Restrictions Other Position/Activity Restrictions: L UE-no lifting >10# for 2-3 weeks 2* biopsy  (s/p biopsy 11/12/18)      Mobility  Bed Mobility Overal bed mobility: Needs Assistance Bed Mobility: Supine to Sit;Sit to Supine     Supine to sit: HOB elevated;Supervision Sit to supine: HOB elevated;Supervision      Transfers Overall transfer level: Needs assistance Equipment used: Rolling walker (2 wheeled) Transfers: Sit to/from Stand Sit to Stand: Min assist         General transfer comment: assist to steady with rise, cues for hand placement, pt reports dizziness which did not resolve with marching in place (approx 15 secs); pt requested return to bed  Ambulation/Gait                 Stairs             Wheelchair Mobility    Modified Rankin (Stroke Patients Only)       Balance                                             Cognition Arousal/Alertness: Awake/alert Behavior During Therapy: Flat affect Overall Cognitive Status: No family/caregiver present to determine baseline cognitive functioning Area of Impairment: Safety/judgement                       Following Commands: Follows one step commands with increased time Safety/Judgement: Decreased awareness of deficits;Decreased awareness of safety   Problem Solving: Slow processing        Exercises      General Comments        Pertinent Vitals/Pain Pain Assessment: No/denies pain    Home Living                      Prior Function            PT Goals (current goals can now be found in the care plan section) Progress towards PT goals: Progressing toward goals    Frequency    Min 2X/week      PT Plan Current plan remains appropriate    Co-evaluation              AM-PAC PT "6 Clicks" Mobility   Outcome Measure  Help needed  turning from your back to your side while in a flat bed without using bedrails?: A Little Help needed moving from lying on your back to sitting on the side of a flat bed without using bedrails?: A Little Help needed moving to and from a bed to a chair (including a wheelchair)?: A Little Help needed standing up from a chair using your arms (e.g., wheelchair or bedside chair)?: A Little Help needed to walk in hospital room?: A Little Help needed climbing 3-5 steps with a railing? : A Lot 6 Click Score: 17    End of Session   Activity Tolerance: Patient limited by fatigue Patient left: in bed;with call bell/phone within reach;with bed alarm set Nurse Communication: Mobility status PT Visit Diagnosis: Difficulty in walking, not elsewhere classified (R26.2);Muscle weakness (generalized) (M62.81)     Time: 3254-9826 PT Time Calculation (min) (ACUTE ONLY): 14 min  Charges:  $Therapeutic Activity: 8-22 mins                     Carmelia Bake, PT, DPT Acute Rehabilitation Services Office: [phone] Pager: [phone]  Trena Platt 12/09/2018, 3:36 PM

## 2018-12-09 NOTE — Significant Event (Signed)
Nursing staff reported that the patient developed bilateral lower extremity multiple petechial rash which was new.  Patient denies any fever, chills pain over the lower extremity.  On examination :bilateral lower extremity multiple petechial rashes (none palpable/non blanchable)  Initially had a low thrombocytes which have been gradually improving.  Low suspicion for ITP, TTP at this time.  Other differentials include medication related, vitamin C, vitamin K deficiency due to poor nutrition.  Possibility of small vessel vasculitis as well.  Will obtain hepatitis panel, DIC work-up, HIV antibody, INR.  Closely monitor overnight.  Also consider voiding trial at this time.

## 2018-12-09 NOTE — Progress Notes (Signed)
HEMATOLOGY-ONCOLOGY PROGRESS NOTE  SUBJECTIVE: Patient more awake and alert today.  Answers questions.  Hoping to go home tomorrow and have her brother stay with her.  Has remained afebrile over the past 24 hours.  Foley catheter remains in place secondary to urinary retention.  Denies diarrhea.    Hodgkin lymphoma ([place])   11/18/2018 Initial Diagnosis    Hodgkin lymphoma ([place])    11/19/2018 -  Chemotherapy    The patient had DOXOrubicin (ADRIAMYCIN) chemo injection 48 mg, 25 mg/m2 = 48 mg, Intravenous,  Once, 1 of 6 cycles palonosetron (ALOXI) injection 0.25 mg, 0.25 mg, Intravenous,  Once, 1 of 6 cycles pegfilgrastim-cbqv (UDENYCA) injection 6 mg, 6 mg, Subcutaneous, Once, 1 of 6 cycles dacarbazine (DTIC) 710 mg in sodium chloride 0.9 % 250 mL chemo infusion, 375 mg/m2 = 710 mg, Intravenous,  Once, 1 of 6 cycles brentuximab vedotin (ADCETRIS) 90 mg in sodium chloride 0.9 % 100 mL chemo infusion, 1.2 mg/kg = 90 mg, Intravenous,  Once, 1 of 6 cycles vinBLAStine (VELBAN) 11.3 mg in sodium chloride 0.9 % 50 mL chemo infusion, 6 mg/m2 = 11.3 mg, Intravenous, Once, 1 of 6 cycles fosaprepitant (EMEND) 150 mg, dexamethasone (DECADRON) 12 mg in sodium chloride 0.9 % 145 mL IVPB, , Intravenous,  Once, 1 of 6 cycles  for chemotherapy treatment.      REVIEW OF SYSTEMS:   Constitutional: Denies fevers, chills Eyes: Denies blurriness of vision Ears, nose, mouth, throat, and face: Denies mucositis or sore throat Respiratory: Denies cough, dyspnea or wheezes Cardiovascular: Denies palpitation, chest discomfort Gastrointestinal:  Denies nausea, heartburn or change in bowel habits Skin: Denies abnormal skin rashes Lymphatics: Denies new lymphadenopathy or easy bruising Neurological:Denies numbness, tingling or new weaknesses Behavioral/Psych: Mood is stable, no new changes  Extremities: No lower extremity edema All other systems were reviewed with the patient and are negative.  I have reviewed the  past medical history, past surgical history, social history and family history with the patient and they are unchanged from previous note.   PHYSICAL EXAMINATION: ECOG PERFORMANCE STATUS: 3 - Symptomatic, >50% confined to bed  Vitals:   12/08/18 2247 12/09/18 0544  BP: 118/66 133/79  Pulse: 86 88  Resp: 18 18  Temp: 98.7 F (37.1 C) 98.3 F (36.8 C)  SpO2: 97% 99%   Filed Weights   12/06/18 0500 12/07/18 0513 12/08/18 0500  Weight: 130 lb 1.1 oz (59 kg) 131 lb 2.8 oz (59.5 kg) 137 lb 12.6 oz (62.5 kg)    GENERAL:alert, no distress and comfortable SKIN: skin color, texture, turgor are normal, no rashes or significant lesions EYES: normal, Conjunctiva are pink and non-injected, sclera clear OROPHARYNX:no exudate, no erythema and lips, buccal mucosa, and tongue normal  NECK: supple, thyroid normal size, non-tender, without nodularity LYMPH:  no palpable lymphadenopathy in the cervical, axillary or inguinal LUNGS: clear to auscultation and percussion with normal breathing effort HEART: regular rate & rhythm and no murmurs and no lower extremity edema ABDOMEN:abdomen soft, non-tender and normal bowel sounds Musculoskeletal:no cyanosis of digits and no clubbing  NEURO: alert & oriented x 3  LABORATORY DATA:  I have reviewed the data as listed CMP Latest Ref Rng & Units 12/09/2018 12/08/2018 12/07/2018  Glucose 70 - 99 mg/dL 94 95 110(H)  BUN 6 - 20 mg/dL 7 9 10   Creatinine 0.44 - 1.00 mg/dL 0.62 0.58 0.64  Sodium 135 - 145 mmol/L 139 137 138  Potassium 3.5 - 5.1 mmol/L 3.3(L) 3.1(L) 3.0(L)  Chloride 98 -  111 mmol/L 105 100 97(L)  CO2 22 - 32 mmol/L 27 28 32  Calcium 8.9 - 10.3 mg/dL 8.0(L) 8.1(L) 9.0  Total Protein 6.5 - 8.1 g/dL 5.3(L) - 5.0(L)  Total Bilirubin 0.3 - 1.2 mg/dL 1.8(H) - 2.1(H)  Alkaline Phos 38 - 126 U/L 386(H) - 421(H)  AST 15 - 41 U/L 58(H) - 51(H)  ALT 0 - 44 U/L 43 - 50(H)    Lab Results  Component Value Date   WBC 5.3 12/09/2018   HGB 7.9 (L)  12/09/2018   HCT 26.0 (L) 12/09/2018   MCV 94.2 12/09/2018   PLT 269 12/09/2018   NEUTROABS 5.6 12/05/2018    ASSESSMENT AND PLAN: 1.  Stage IV Hodgkin lymphoma -Port placed in 10/2018; TTE showed normal LVEF -The patient received day 1 of cycle 1 ofAVD + Adcetris on 11/19/2018. -Status post Granix. -White blood cell count and platelet count have now recovered. -Recommend PRN anti-emetics, including Zofran and Compazine -CT scan of the abdomen pelvis showed the intra-abdominal lymph nodes appear to be improving. Spleen is decreased in size. -Next dose of chemo will be given as an outpatient.  Date to be determined.  Normocytic anemia -Likely secondary to anemia of chronic disease and underlying lymphoma -Hemoglobinis7.9. -Supportive transfusion to keep Hgb >7. Blood products to be irradiated.  If the patient will be discharged tomorrow, recommend giving 1 unit of packed red blood cells if her hemoglobin is less than 8.0.  Thrombocytopenia -resolved -Daily CBC -Goal plts >10k in the absence of bleeding. Blood products to be irradiated.  Neutropenic fever  -Secondary to underlying lymphoma,bacteremiaand C. diff colitis -Patient had+ blood cultures (E Coli and Group B Strep). Repeat cultures negative to date. -Completed ceftriaxone -Afebrile for the past 24 hours.  Severe protein malnutrition -Patient has lost > 20 lbs since the onset of her symptoms, and her most recent albumin was 2.0 with anasarca, consistent with severe protein malnutrition -Dietitian is following. -The patient is now refusing to eat. -If her nutrition remains very poor and she is unable to increase her PO intake, we will have to consider enteral nutrition, such as PEG tube, to help improve her nutritional intake.  Diarrhea with abdominal discomfort- Resolved -Stool forC.diffpositive.  -Continue oral vancomycin for 14 days per ID. -CT the abdomen pelvis did not show any concerning  findings in the left lower quadrant of her abdomen. -Abdominal pain has resolved. Stools are now formed.  -Continue pain medsas needed.  Electrolyte abnormalities -Electrolyte abnormalitiesoverall improved. Replete per primary team.  Hyperbilirubinemia -Unclear etiology. CT scan and abdominal U/Sdid not show any focal liver abnormality or obstruction. Possibly reactive.  -Total bilirubin is trending down. -GI has signed off.  Urinary retention -Foley catheter in place. -Urology has been consulted.  Will follow up with urology as an outpatient.  Deconditioning -The patient is more alert today.  Seems to be willing to work with therapy. -The patient is overall deconditioned and functional status is marginal.  May be challenging to give her additional chemotherapy if functional status does not continue to improve.  We will reevaluate the patient as an outpatient to determine when the next dose of chemotherapy will be administered. -I have spoken with the palliative care team who will see the patient later today or tomorrow to readdress goals of care.  Mikey Bussing, DNP, AGPCNP-BC, AOCNP

## 2018-12-10 DIAGNOSIS — Z681 Body mass index (BMI) 19 or less, adult: Secondary | ICD-10-CM

## 2018-12-10 DIAGNOSIS — E43 Unspecified severe protein-calorie malnutrition: Secondary | ICD-10-CM

## 2018-12-10 DIAGNOSIS — R233 Spontaneous ecchymoses: Secondary | ICD-10-CM

## 2018-12-10 LAB — ANCA TITERS
Atypical P-ANCA titer: <1:20 {titer}
P-ANCA: <1:20 {titer}

## 2018-12-10 LAB — BASIC METABOLIC PANEL
Anion gap: 6 (ref 5–15)
BUN: 5 mg/dL — AB (ref 6–20)
CHLORIDE: 111 mmol/L (ref 98–111)
CO2: 24 mmol/L (ref 22–32)
Calcium: 7 mg/dL — ABNORMAL LOW (ref 8.9–10.3)
Creatinine, Ser: 0.39 mg/dL — ABNORMAL LOW (ref 0.44–1.00)
GFR calc Af Amer: >60 mL/min (ref >60)
GFR calc non Af Amer: >60 mL/min (ref >60)
Glucose, Bld: 157 mg/dL — ABNORMAL HIGH (ref 70–99)
POTASSIUM: 2.9 mmol/L — AB (ref 3.5–5.1)
Sodium: 141 mmol/L (ref 135–145)

## 2018-12-10 LAB — CBC
HCT: 25.4 % — ABNORMAL LOW (ref 36.0–46.0)
HEMOGLOBIN: 7.8 g/dL — AB (ref 12.0–15.0)
MCH: 29.1 pg (ref 26.0–34.0)
MCHC: 30.7 g/dL (ref 30.0–36.0)
MCV: 94.8 fL (ref 80.0–100.0)
Platelets: 321 10*3/uL (ref 150–400)
RBC: 2.68 MIL/uL — ABNORMAL LOW (ref 3.87–5.11)
RDW: 16.6 % — ABNORMAL HIGH (ref 11.5–15.5)
WBC: 6.5 10*3/uL (ref 4.0–10.5)
nRBC: 0 % (ref 0.0–0.2)

## 2018-12-10 LAB — HEPATITIS PANEL, ACUTE
HCV Ab: <0.1 s/co ratio (ref 0.0–0.9)
HEP B C IGM: NEGATIVE
HEP B S AG: NEGATIVE
Hep A IgM: NEGATIVE

## 2018-12-10 LAB — MPO/PR-3 (ANCA) ANTIBODIES
ANCA Proteinase 3: <3.5 U/mL (ref 0.0–3.5)
Myeloperoxidase Abs: <9 U/mL (ref 0.0–9.0)

## 2018-12-10 LAB — RHEUMATOID FACTOR: Rheumatoid fact SerPl-aCnc: <10 IU/mL (ref 0.0–13.9)

## 2018-12-10 LAB — MISC LABCORP TEST (SEND OUT): Labcorp test code: 9985

## 2018-12-10 LAB — SAMPLE TO BLOOD BANK

## 2018-12-10 LAB — ANTINUCLEAR ANTIBODIES, IFA: ANA Ab, IFA: NEGATIVE

## 2018-12-10 LAB — C4 COMPLEMENT: Complement C4, Body Fluid: 27 mg/dL (ref 14–44)

## 2018-12-10 LAB — HIV ANTIBODY (ROUTINE TESTING W REFLEX): HIV Screen 4th Generation wRfx: NONREACTIVE

## 2018-12-10 MED ORDER — POTASSIUM CHLORIDE 20 MEQ PO PACK
40.0000 meq | PACK | Freq: Three times a day (TID) | ORAL | Status: DC
  Administered 2018-12-10 – 2018-12-12 (×5): 40 meq via ORAL
  Filled 2018-12-10 (×7): qty 2

## 2018-12-10 MED ORDER — POTASSIUM CHLORIDE 10 MEQ/100ML IV SOLN
10.0000 meq | INTRAVENOUS | Status: AC
  Administered 2018-12-10 (×4): 10 meq via INTRAVENOUS
  Filled 2018-12-10 (×4): qty 100

## 2018-12-10 MED ORDER — MAGNESIUM OXIDE 400 (241.3 MG) MG PO TABS
400.0000 mg | ORAL_TABLET | Freq: Two times a day (BID) | ORAL | Status: DC
  Administered 2018-12-10 – 2018-12-12 (×4): 400 mg via ORAL
  Filled 2018-12-10 (×4): qty 1

## 2018-12-10 MED ORDER — MAGNESIUM SULFATE 2 GM/50ML IV SOLN
2.0000 g | Freq: Once | INTRAVENOUS | Status: AC
  Administered 2018-12-10: 2 g via INTRAVENOUS
  Filled 2018-12-10: qty 50

## 2018-12-10 NOTE — Progress Notes (Signed)
Physical Therapy Treatment Patient Details Name: Cindy Ward MRN: [id number] DOB: [birth year] Today's Date: 12/10/2018    History of Present Illness 53 yo female admitted to ED on 11/11/18 for 9 month history of N/V, fatigue, and weight loss. Pt previously followed in [place], oncology reports inconclusive findings. 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 Pt with R iliac BM aspiration and core, awaiting results. Other PMH includes HTN, hypothyroidism.  Course complicated by sepsis and trasnferred to ICU on 11/25/18.    PT Comments    Significant mobility decline. Pt required + 2 assist and was only able to progress to 18 feet.  General Gait Details: very limited distance due to MAX c/o weakness and increased c/o dizziness    standing BP 82/52   Recliner brought to pt then assisted back to bed.  Pt completely wore out.  Positioned to comfort.    Follow Up Recommendations  SNF     Equipment Recommendations  None recommended by PT    Recommendations for Other Services       Precautions / Restrictions Precautions Precautions: Fall Restrictions Weight Bearing Restrictions: No Other Position/Activity Restrictions: L UE-no lifting >10# for 2-3 weeks 2* biopsy  (s/p biopsy 11/12/18)      Mobility  Bed Mobility Overal bed mobility: Needs Assistance Bed Mobility: Supine to Sit;Sit to Supine     Supine to sit: Min assist Sit to supine: Mod assist;Max assist   General bed mobility comments: required increased assist  Transfers Overall transfer level: Needs assistance Equipment used: Rolling walker (2 wheeled) Transfers: Sit to/from Stand Sit to Stand: Min assist;Mod assist Stand pivot transfers: Mod assist       General transfer comment: assist to steady with rise, cues for hand placement, pt reports dizziness     VERY WEAK  Ambulation/Gait Ambulation/Gait assistance: Min assist;+2 physical assistance;+2  safety/equipment Gait Distance (Feet): 18 Feet Assistive device: Rolling walker (2 wheeled) Gait Pattern/deviations: Trunk flexed;Step-to pattern;Decreased step length - right;Decreased step length - left Gait velocity: decreased    General Gait Details: very limited distance due to MAX c/o weakness and increased c/o dizziness    standing BP 82/52   Stairs             Wheelchair Mobility    Modified Rankin (Stroke Patients Only)       Balance                                            Cognition Arousal/Alertness: Awake/alert;Lethargic Behavior During Therapy: Flat affect Overall Cognitive Status: No family/caregiver present to determine baseline cognitive functioning                                 General Comments: weak, fagile      Exercises      General Comments        Pertinent Vitals/Pain Pain Assessment: No/denies pain    Home Living                      Prior Function            PT Goals (current goals can now be found in the care plan section) Progress towards PT goals: Progressing toward goals    Frequency  Min 2X/week      PT Plan Current plan remains appropriate    Co-evaluation              AM-PAC PT "6 Clicks" Mobility   Outcome Measure                   End of Session Equipment Utilized During Treatment: Gait belt Activity Tolerance: Patient limited by fatigue Patient left: in bed;with call bell/phone within reach;with bed alarm set Nurse Communication: Mobility status PT Visit Diagnosis: Difficulty in walking, not elsewhere classified (R26.2);Muscle weakness (generalized) (M62.81)     Time: 2395-3202 PT Time Calculation (min) (ACUTE ONLY): 16 min  Charges:  $Gait Training: 8-22 mins                     Rica Koyanagi  PTA Acute  Rehabilitation Services Pager      [phone] Office      [phone]

## 2018-12-10 NOTE — Progress Notes (Addendum)
PROGRESS NOTE  Cindy Ward SHF:[id number] DOB: [birth year] DOA: 11/11/2018 PCP: [name], MD   LOS: 29 days   Brief narrative:  53 year old female with history of essential hypertension, hypothyroidism initially presented to [facility] with complaints of recurrent nausea, vomiting, fever and night sweats.  Upon extensive work-up, she was diagnosed with Hodgkin's lymphoma, stage IV.  Oncology team was consulted and patient was transferred here for further care and management.  Lymph node biopsy was consistent with classic Hodgkin's lymphoma.  Hospital course has been complicated by bacteremia, intermittent encephalopathy, severe protein calorie malnutrition and electrolyte imbalance.    Subjective:  Patient continues to be more alert awake and communicative.  She has been starting to eat better.  She still feels dizzy lightheaded on ambulation but is willing to continue to ambulate.  Patient reported multiple petechial rash over the lower extremity yesterday.    Assessment/Plan:  Principal Problem:   Hodgkin lymphoma (HCC) Active Problems:   Lymphadenopathy   Fever   Splenomegaly   Liver enzyme elevation   Thrombocytopenia (HCC)   Unintentional weight loss   Hashimoto's thyroiditis   Goiter   Hypertension   Aortic atherosclerosis (HCC)   Pancytopenia (HCC)   Malnutrition of moderate degree   Normocytic anemia   Neutropenic fever (HCC)   Oral thrush   Enteritis due to Clostridium difficile   Streptococcal bacteremia   Palliative care by specialist   Goals of care, counseling/discussion   Total bilirubin, elevated  Stage IV Hodgkin's lymphoma, new diagnosis  Patient underwent port placement on 2/24 and chemotherapy on 2/25.  Plans for second round of chemotherapy outpatient when patient is much stronger and total bilirubin is at least below 2.0 per Dr Maylon Peppers, oncology.      Lower extremity multiple petechial rash.  New diagnosis since yesterday. Undefined etiology  at this time.  No mention of any bleeding anywhere.  Platelets have been improving.   I spoke with oncology and oncology do not believe that lymphoma could cause this.  Unlikely to be ITP TTP DIC.  Could be small vessel vasculitis.  Dermatology is not available in the hospital.    Will need to closely monitor.  C4 complement level is within normal limits.  HIV nonreactive.   ANCA negative.  Hepatitis panel negative.  RA factor negative.  At this time, patient might just need observation.  Intermittent metabolic encephalopathy .  Resolved at this time.  Fever.  T Max of 98.8 F.  continue p.o. vancomycin.  Completed IV Rocephin for E. coli bacteremia.  Blood cultures from 12/06/2018 are negative so far.   Repeat urine analysis was negative for infection.  And was on a Foley catheter which has been removed since yesterday and patient was able to void today.Chest x-ray was negative for any acute infiltrate.    Left breast erythema without induration.  Resolved.  Hypokalemia She has had significant hypokalemia despite multiple runs of potassium and magnesium supplements over the course of the hospitalization.  Patient is currently on D5 water will add iv potassium as well.  Continue on oral potassium as well.    Pancytopenia with mild rectal/vaginal bleeding.  Resolved.    Sinus tachycardia; greatly improved with hydration and beta-blockers.  Bacteremia with E. coli and group B strep with neutropenia C. difficile colitis, improved. Completed Rocephin IV,  Oral vancomycin 12/14 as per infectious disease recommendation.  No further diarrheal episodes.  Repeat blood culture from 12/06/2018 with no growth    Elevated Total Bilirubin;  possible due to hepatotoxic meds -CT abdomen pelvis was negative for any acute pathology including right upper quadrant ultrasound.   Bilirubin has trended down to 1.8 today.  Severe protein calorie malnutrition on D5 normal saline, encourage oral nutrition.  Patient has  been more alert awake over the last 2 to 3 days and has been starting to eat better than before.   I had a prolonged discussion with the patient's daughter [name] on the phone regarding the alternative modes of nutrition.  Palliative care has seen the patient today and I saw the recommendation of a feeding tube.  Patient's daughter Anderson Malta does not wish her to have a feeding tube unless is necessary especially in the context of recent neutropenia, fever, immunosuppressed status.  If the patient continues to improve her oral intake it might be reasonable not to place a feeding tube but if her nutrition continues to be poor, may need to consider.  This was what was discussed with [name].  Acute urinary retention with bilateral hydroureteronephrosis  was on a Foley catheter which has been removed.  Will closely monitor.  Mild dysphagia with oral thrush -  Continue nystatin  Hypothyroidism -Continue Synthroid  Generalized debility - PT on board.  Recommended skilled nursing facility placement but patient does not have payor source for rehabilitation. Medicaid application pending.  Continue to ambulate the patient.    We will continue to replenish electrolytes aggressively.  Plan is to discharge patient home continues to eat better and ambulate.  Patient will likely not have physical therapy and occupational therapy at home.  We will continue to ambulate while in hospital.  VTE Prophylaxis: SCD  Code Status: Full code  Family Communication: I spoke with the patient's  daughter on the phone at length today.  Please see patient above.  Disposition Plan: If the patient continues to improve, plan is disposition home likely in the next 1-2 days.  Patient states that she wishes to be with her brother.  Consultants:  Pulmonary, oncology, GI, palliative care  Procedures:  Bx 2/21  Port cath 2/24  Antibiotics: Anti-infectives (From admission, onward)   Start     Dose/Rate Route  Frequency Ordered Stop   11/27/18 1130  vancomycin (VANCOCIN) 50 mg/mL oral solution 125 mg     125 mg Oral 4 times daily 11/27/18 1051 12/10/18 2359   11/27/18 0915  metroNIDAZOLE (FLAGYL) IVPB 500 mg  Status:  Discontinued     500 mg 100 mL/hr over 60 Minutes Intravenous Every 8 hours 11/27/18 0912 11/27/18 1051   11/26/18 0200  cefTRIAXone (ROCEPHIN) 2 g in sodium chloride 0.9 % 100 mL IVPB     2 g 200 mL/hr over 30 Minutes Intravenous Daily at bedtime 11/26/18 0059 12/06/18 0700   11/25/18 1200  fluconazole (DIFLUCAN) IVPB 200 mg  Status:  Discontinued     200 mg 100 mL/hr over 60 Minutes Intravenous Every 24 hours 11/25/18 0836 12/02/18 1255   11/25/18 1000  vancomycin (VANCOCIN) IVPB 1000 mg/200 mL premix  Status:  Discontinued     1,000 mg 200 mL/hr over 60 Minutes Intravenous Every 12 hours 11/25/18 0848 11/26/18 0827   11/25/18 1000  ceFEPIme (MAXIPIME) 2 g in sodium chloride 0.9 % 100 mL IVPB  Status:  Discontinued     2 g 200 mL/hr over 30 Minutes Intravenous Every 8 hours 11/25/18 0848 11/26/18 0058   11/25/18 0900  vancomycin (VANCOCIN) 1,500 mg in sodium chloride 0.9 % 500 mL IVPB  1,500 mg 250 mL/hr over 120 Minutes Intravenous  Once 11/25/18 0848 11/25/18 1135   11/24/18 1500  fluconazole (DIFLUCAN) tablet 200 mg  Status:  Discontinued     200 mg Oral Daily 11/24/18 1450 11/25/18 0836   11/18/18 0600  ceFAZolin (ANCEF) IVPB 2g/100 mL premix     2 g 200 mL/hr over 30 Minutes Intravenous To Radiology 11/17/18 1243 11/18/18 1130   11/12/18 0800  cefTRIAXone (ROCEPHIN) 2 g in sodium chloride 0.9 % 100 mL IVPB  Status:  Discontinued     2 g 200 mL/hr over 30 Minutes Intravenous Every 24 hours 11/11/18 1711 11/12/18 1057     Objective: Vitals:   12/10/18 0502 12/10/18 1421  BP: 111/76 136/77  Pulse: (!) 111 90  Resp: 18 (!) 21  Temp: 98.1 F (36.7 C) 98.8 F (37.1 C)  SpO2: 100% 99%    Intake/Output Summary (Last 24 hours) at 12/10/2018 1556 Last data filed at  12/10/2018 1500 Gross per 24 hour  Intake 1691.76 ml  Output 750 ml  Net 941.76 ml   Filed Weights   12/07/18 0513 12/08/18 0500 12/09/18 0830  Weight: 59.5 kg 62.5 kg 59.4 kg   Body mass index is 19.92 kg/m.   Physical Exam: General:   not in obvious distress, thinly built.  Alert awake and communicative. HENT: Normocephalic, pupils equally reacting to light and accommodation.  Mild pallor noted. Oral mucosa is moist.  Chest:  Clear breath sounds.  Diminished breath sounds bilaterally. No crackles or wheezes.  Axillary area with a lymph node.  Left breast erythema improved CVS: S1 &S2 heard. No murmur.  Regular rate and rhythm. Abdomen: Soft, nontender, nondistended.  Bowel sounds are heard.  Liver is not palpable, no abdominal mass palpated Extremities: No cyanosis, clubbing or edema.  Peripheral pulses are palpable.  Multiple petechial rashes noted which is not blanchable. Psych: Alert, awake and communicative. CNS:  No cranial nerve deficits.  Power equal in all extremities.  No sensory deficits noted.  No cerebellar signs.  SKIN: Multiple lower extremity petechial rash noted.  Mild erythematous rash noted on the upper extremities.   Data Review: I have personally reviewed the following laboratory data and studies,  CBC: Recent Labs  Lab 12/04/18 0411 12/05/18 0425 12/06/18 0440 12/07/18 0309 12/08/18 0413 12/09/18 0428 12/09/18 1756 12/10/18 0840  WBC 4.1 7.5 7.8 5.4 4.5 5.3  --  6.5  NEUTROABS 2.4 5.6  --   --   --   --   --   --   HGB 8.5* 8.3* 8.1* 7.7* 7.7* 7.9*  --  7.8*  HCT 27.5* 25.9* 26.5* 25.3* 25.5* 26.0*  --  25.4*  MCV 91.1 90.6 93.3 93.0 94.1 94.2  --  94.8  PLT 135* 181 211 224 236 269 285 195   Basic Metabolic Panel: Recent Labs  Lab 12/06/18 0440 12/06/18 1416 12/07/18 0309 12/08/18 0413 12/09/18 0428 12/10/18 0405  NA 144 140 138 137 139 141  K 3.1* 3.7 3.0* 3.1* 3.3* 2.9*  CL 100 98 97* 100 105 111  CO2 31 32 32 28 27 24   GLUCOSE 81  103* 110* 95 94 157*  BUN 10 11 10 9 7  5*  CREATININE 0.79 0.63 0.64 0.58 0.62 0.39*  CALCIUM 9.9 9.5 9.0 8.1* 8.0* 7.0*  MG 1.7 2.3 1.8 1.7 2.4  --   PHOS  --   --  2.9  --   --   --    Liver Function Tests:  Recent Labs  Lab 12/04/18 0411 12/05/18 0425 12/06/18 0440 12/07/18 0309 12/09/18 0428  AST 45* 60* 59* 51* 58*  ALT 62* 63* 57* 50* 43  ALKPHOS 363* 710* 613* 421* 386*  BILITOT 3.8* 3.5* 2.7* 2.1* 1.8*  PROT 4.8* 5.0* 5.2* 5.0* 5.3*  ALBUMIN 1.7* 1.8* 2.0* 1.9* 2.1*   No results for input(s): LIPASE, AMYLASE in the last 168 hours. Recent Labs  Lab 12/05/18 1355  AMMONIA 10   Cardiac Enzymes: No results for input(s): CKTOTAL, CKMB, CKMBINDEX, TROPONINI in the last 168 hours. BNP (last 3 results) Recent Labs    11/21/18 1519  BNP 1,731.6*    ProBNP (last 3 results) No results for input(s): PROBNP in the last 8760 hours.  CBG: No results for input(s): GLUCAP in the last 168 hours. Recent Results (from the past 240 hour(s))  Culture, blood (routine x 2)     Status: None (Preliminary result)   Collection Time: 12/06/18  1:00 PM  Result Value Ref Range Status   Specimen Description   Final    BLOOD RIGHT ARM Performed at [facility], [address], [place], [place] [address]    Special Requests   Final    BOTTLES DRAWN AEROBIC ONLY Blood Culture results may not be optimal due to an inadequate volume of blood received in culture bottles Performed at [facility] [address], [place], [place] [address]    Culture   Final    NO GROWTH 4 DAYS Performed at [facility], [place] [address], [place], [place] [address]    Report Status PENDING  Incomplete  Culture, blood (routine x 2)     Status: None (Preliminary result)   Collection Time: 12/06/18  1:00 PM  Result Value Ref Range Status   Specimen Description   Final    BLOOD LEFT ARM Performed at [facility], [place] [address], [place], [place] [address]    Special  Requests   Final    BOTTLES DRAWN AEROBIC ONLY Blood Culture results may not be optimal due to an inadequate volume of blood received in culture bottles Performed at [facility] [address] [place], [place] [address]    Culture   Final    NO GROWTH 4 DAYS Performed at [facility], [place] [address] [place], [place] [address]    Report Status PENDING  Incomplete     Studies: No results found.  Scheduled Meds: . levothyroxine  75 mcg Oral Daily  . mouth rinse  15 mL Mouth Rinse BID  . multivitamin with minerals  1 tablet Oral Daily  . nebivolol  10 mg Oral Daily  . nystatin  5 mL Oral QID  . vancomycin  125 mg Oral QID    Continuous Infusions: . sodium chloride 1,000 mL (11/30/18 1420)  . sodium chloride    . dextrose 5 % and 0.9% NaCl 50 mL/hr at 12/10/18 1500     Flora Lipps, MD  [facility] 12/10/2018

## 2018-12-10 NOTE — Progress Notes (Signed)
Nutrition Follow-up  DOCUMENTATION CODES:   Non-severe (moderate) malnutrition in context of chronic illness  INTERVENTION:   **Recommend placement of feeding tube with initiation of nutrition support if within [place].   If tube placed recommend initiation of Osmolite 1.5 @ 20 ml/hr increase by 10 ml q8 hrs to goal rate of 55 ml/hr (1320 ml) + 30 ml Prostat BID. This provides 2180 kcal, 113 gram protein, and 1006 ml free water.   - Continue Magic Cup BID.  - Continue Boost Breeze once/day PRN and Unjury BID PRN.  NUTRITION DIAGNOSIS:   Moderate Malnutrition related to chronic illness, nausea, vomiting, other (see comment)(night sweats) as evidenced by mild fat depletion, mild muscle depletion, moderate muscle depletion.  Ongoing  GOAL:   Patient will meet greater than or equal to 90% of their needs  Not meeting  MONITOR:   PO intake, Supplement acceptance, Diet advancement, Weight trends, Labs, Skin  REASON FOR ASSESSMENT:   Consult Assessment of nutrition requirement/status  ASSESSMENT:   53 y.o. female with medical history significant of HTN and hypothyroidism. She presented at [facility] with recurrent N/V, fevers, and night sweats of 9 months duration. She notes that the symptoms occur about every 2 weeks. She has nightly fevers and night sweats.  The symptoms occur for days at a time, but have been taking longer and longer to resolve. She presented to the ED after getting dizzy and passing out at the [facility]. She was noted to have a fever of 102.7. Multiple enlarged lymph nodes noted on CT abdomen and pelvis.   Mentation began to clear a few days ago. RN reports pt started to become more lethargic last night and into this morning. RD attempted to wake pt, but pt unable to stay awake during conversation. Meal completions seem to be very minimal (0-25% for her last eight meals). RD was told that pt ate a small amount of chicken yesterday. Refusing supplements.    Palliative consulted yesterday for further [place] discussions. Recommend placement of permanent feeding tube for primary nutrition source as mentation seems to fluctuate frequently and pt is malnourished.   Weight noted to decrease from 143 lb on 3/11 to 131 lb this admission. Will continue to monitor trends.   Medications reviewed and include: MVI with minerals, D5 in NS @ 50 ml/hr Labs reviewed: K 2.9 (L)   Diet Order:   Diet Order            DIET DYS 3 Room service appropriate? Yes; Fluid consistency: Thin  Diet effective now              EDUCATION NEEDS:   Education needs have been addressed  Skin:  Skin Assessment: Skin Integrity Issues: Skin Integrity Issues:: Incisions Incisions: L axilla (2/18)  Last BM:  3/16  Height:   Ht Readings from Last 1 Encounters:  11/11/18 5\' 8"  (1.727 m)    Weight:   Wt Readings from Last 1 Encounters:  12/09/18 59.4 kg    Ideal Body Weight:  63.64 kg  BMI:  Body mass index is 19.92 kg/m.  Estimated Nutritional Needs:   Kcal:  2100-2300 kcal  Protein:  100-115 grams  Fluid:  >/= 2.1 L/day   Mariana Single RD, LDN Clinical Nutrition Pager # - [phone]

## 2018-12-10 NOTE — Progress Notes (Signed)
HEMATOLOGY-ONCOLOGY PROGRESS NOTE  SUBJECTIVE: Patient more awake and alert today.  Answers questions.  Has remained afebrile over the past 24 hours. Denies diarrhea.  Has developed petechial rash to her bilateral lower extremities.  Rash is not bothering her.  Likely not going home today due to electrolyte abnormalities.    Hodgkin lymphoma ([place])   11/18/2018 Initial Diagnosis    Hodgkin lymphoma ([place])    11/19/2018 -  Chemotherapy    The patient had DOXOrubicin (ADRIAMYCIN) chemo injection 48 mg, 25 mg/m2 = 48 mg, Intravenous,  Once, 1 of 6 cycles palonosetron (ALOXI) injection 0.25 mg, 0.25 mg, Intravenous,  Once, 1 of 6 cycles pegfilgrastim-cbqv (UDENYCA) injection 6 mg, 6 mg, Subcutaneous, Once, 1 of 6 cycles dacarbazine (DTIC) 710 mg in sodium chloride 0.9 % 250 mL chemo infusion, 375 mg/m2 = 710 mg, Intravenous,  Once, 1 of 6 cycles brentuximab vedotin (ADCETRIS) 90 mg in sodium chloride 0.9 % 100 mL chemo infusion, 1.2 mg/kg = 90 mg, Intravenous,  Once, 1 of 6 cycles vinBLAStine (VELBAN) 11.3 mg in sodium chloride 0.9 % 50 mL chemo infusion, 6 mg/m2 = 11.3 mg, Intravenous, Once, 1 of 6 cycles fosaprepitant (EMEND) 150 mg, dexamethasone (DECADRON) 12 mg in sodium chloride 0.9 % 145 mL IVPB, , Intravenous,  Once, 1 of 6 cycles  for chemotherapy treatment.      REVIEW OF SYSTEMS:   Constitutional: Denies fevers, chills Eyes: Denies blurriness of vision Ears, nose, mouth, throat, and face: Denies mucositis or sore throat Respiratory: Denies cough, dyspnea or wheezes Cardiovascular: Denies palpitation, chest discomfort Gastrointestinal:  Denies nausea, heartburn or change in bowel habits Skin: Rash noted to bilateral lower extremities and upper extremities.  Rash is not pruritic or painful. Lymphatics: Denies new lymphadenopathy or easy bruising Neurological:Denies numbness, tingling or new weaknesses Behavioral/Psych: Mood is stable, no new changes  Extremities: No lower extremity  edema All other systems were reviewed with the patient and are negative.  I have reviewed the past medical history, past surgical history, social history and family history with the patient and they are unchanged from previous note.   PHYSICAL EXAMINATION: ECOG PERFORMANCE STATUS: 3 - Symptomatic, >50% confined to bed  Vitals:   12/09/18 2202 12/10/18 0502  BP: 129/85 111/76  Pulse: 93 (!) 111  Resp: 18 18  Temp: 97.9 F (36.6 C) 98.1 F (36.7 C)  SpO2: 100% 100%   Filed Weights   12/07/18 0513 12/08/18 0500 12/09/18 0830  Weight: 131 lb 2.8 oz (59.5 kg) 137 lb 12.6 oz (62.5 kg) 131 lb (59.4 kg)    GENERAL:alert, no distress and comfortable SKIN: See photos below for rashes to bilateral lower extremities and upper arms. EYES: normal, Conjunctiva are pink and non-injected, sclera clear OROPHARYNX:no exudate, no erythema and lips, buccal mucosa, and tongue normal  NECK: supple, thyroid normal size, non-tender, without nodularity LYMPH:  no palpable lymphadenopathy in the cervical, axillary or inguinal LUNGS: clear to auscultation and percussion with normal breathing effort HEART: regular rate & rhythm and no murmurs and no lower extremity edema ABDOMEN:abdomen soft, non-tender and normal bowel sounds Musculoskeletal:no cyanosis of digits and no clubbing  NEURO: alert & oriented x 3   Lower extremities:   Left arm:   Right arm:     LABORATORY DATA:  I have reviewed the data as listed CMP Latest Ref Rng & Units 12/10/2018 12/09/2018 12/08/2018  Glucose 70 - 99 mg/dL 157(H) 94 95  BUN 6 - 20 mg/dL 5(L) 7 9  Creatinine  0.44 - 1.00 mg/dL 0.39(L) 0.62 0.58  Sodium 135 - 145 mmol/L 141 139 137  Potassium 3.5 - 5.1 mmol/L 2.9(L) 3.3(L) 3.1(L)  Chloride 98 - 111 mmol/L 111 105 100  CO2 22 - 32 mmol/L 24 27 28   Calcium 8.9 - 10.3 mg/dL 7.0(L) 8.0(L) 8.1(L)  Total Protein 6.5 - 8.1 g/dL - 5.3(L) -  Total Bilirubin 0.3 - 1.2 mg/dL - 1.8(H) -  Alkaline Phos 38 - 126 U/L -  386(H) -  AST 15 - 41 U/L - 58(H) -  ALT 0 - 44 U/L - 43 -    Lab Results  Component Value Date   WBC 6.5 12/10/2018   HGB 7.8 (L) 12/10/2018   HCT 25.4 (L) 12/10/2018   MCV 94.8 12/10/2018   PLT 321 12/10/2018   NEUTROABS 5.6 12/05/2018    ASSESSMENT AND PLAN: 1.  Stage IV Hodgkin lymphoma -Port placed in 10/2018; TTE showed normal LVEF -The patient received day 1 of cycle 1 ofAVD + Adcetris on 11/19/2018. -Status post Granix. -White blood cell count and platelet count have now recovered. -Recommend PRN anti-emetics, including Zofran and Compazine -CT scan of the abdomen pelvis showed the intra-abdominal lymph nodes appear to be improving. Spleen is decreased in size. -Next dose of chemo will be given as an outpatient.  Date to be determined.  Normocytic anemia -Likely secondary to anemia of chronic disease and underlying lymphoma -Hemoglobinis7.8. -Supportive transfusion to keep Hgb >7. Blood products to be irradiated.  If the patient will be discharged tomorrow, recommend giving 1 unit of packed red blood cells if her hemoglobin is less than 8.0.  Thrombocytopenia -resolved -Daily CBC -Goal plts >10k in the absence of bleeding. Blood products to be irradiated.  Neutropenic fever  -Secondary to underlying lymphoma,bacteremiaand C. diff colitis -Patient had+ blood cultures (E Coli and Group B Strep). Repeat cultures negative to date. -Completed ceftriaxone -Afebrile for the past 24 hours.  Severe protein malnutrition -Patient has lost > 20 lbs since the onset of her symptoms, and her most recent albumin was 2.0 with anasarca, consistent with severe protein malnutrition -Dietitian is following. -The patient is now refusing to eat. -If her nutrition remains very poor and she is unable to increase her PO intake, we will have to consider enteral nutrition, such as PEG tube, to help improve her nutritional intake.  Diarrhea with abdominal discomfort-  Resolved -Stool forC.diffpositive.  -Continue oral vancomycin for 14 days per ID. -CT the abdomen pelvis did not show any concerning findings in the left lower quadrant of her abdomen. -Abdominal pain has resolved. Stools are now formed.  -Continue pain medsas needed.  Electrolyte abnormalities -Electrolyte abnormalitiesoverall improved. Replete per primary team.  Hyperbilirubinemia -Unclear etiology. CT scan and abdominal U/Sdid not show any focal liver abnormality or obstruction. Possibly reactive.  -Total bilirubin is trending down. -GI has signed off.  Urinary retention -Urology has been consulted.  Will follow up with urology as an outpatient.  Deconditioning -The patient is more alert today.  Seems to be willing to work with therapy. -The patient is overall deconditioned and functional status is marginal.  May be challenging to give her additional chemotherapy if functional status does not continue to improve.  We will reevaluate the patient as an outpatient to determine when the next dose of chemotherapy will be administered. -I have spoken with the palliative care team who will see the patient soon to readdress goals of care.  Petechial rash -Unclear etiology.  Discussed with Dr. Maylon Peppers  and unlikely due to her underlying cancer.  Also not likely due to her chemotherapy as it has been over 3 weeks since she is received a dose. -Platelet count is completely normal.  No work-up that has already been completed.  She has a normal RF and elevated CRP.  Additional labs are pending. -Discussed with hospitalist.  Consider referral to dermatology.    Mikey Bussing, DNP, AGPCNP-BC, AOCNP

## 2018-12-10 NOTE — Progress Notes (Signed)
Daily Progress Note   Patient Name: Cindy Ward       Date: 12/10/2018 DOB: 06/09/1966  Age: 53 y.o. MRN#: [id number] Attending Physician: Flora Lipps, MD Primary Care Physician: [name], MD Admit Date: 11/11/2018  Reason for Consultation/Follow-up: Establishing goals of care  Subjective: Patient is asleep, resting in bed, appears to be breathing comfortably, there is no family at the bedside. She does not arouse to her name being called gently.   Call placed and discussed with daughter Garlan Fillers at [phone]. See below.   Length of Stay: 29  Current Medications: Scheduled Meds:  . levothyroxine  75 mcg Oral Daily  . mouth rinse  15 mL Mouth Rinse BID  . multivitamin with minerals  1 tablet Oral Daily  . nebivolol  10 mg Oral Daily  . nystatin  5 mL Oral QID  . vancomycin  125 mg Oral QID    Continuous Infusions: . sodium chloride 1,000 mL (11/30/18 1420)  . sodium chloride    . dextrose 5 % and 0.9% NaCl 50 mL/hr at 12/10/18 1101  . potassium chloride 10 mEq (12/10/18 1200)    PRN Meds: sodium chloride, acetaminophen **OR** acetaminophen, feeding supplement, HYDROmorphone (DILAUDID) injection, ibuprofen, iohexol, ipratropium-albuterol, lip balm, ondansetron **OR** ondansetron (ZOFRAN) IV, oxyCODONE, phenol, protein supplement, sodium chloride flush  Physical Exam         Resting in bed Has rash Work of breathing appears to be non labored Appears weak No edema  Vital Signs: BP 111/76 (BP Location: Left Arm)   Pulse (!) 111   Temp 98.1 F (36.7 C) (Oral)   Resp 18   Ht 5\' 8"  (1.727 m)   Wt 59.4 kg   LMP  (LMP Unknown) Comment: >10 years no period  SpO2 100%   BMI 19.92 kg/m  SpO2: SpO2: 100 % O2 Device: O2 Device: Room Air O2 Flow Rate: O2  Flow Rate (L/min): 2 L/min  Intake/output summary:   Intake/Output Summary (Last 24 hours) at 12/10/2018 1233 Last data filed at 12/10/2018 0842 Gross per 24 hour  Intake 1241.2 ml  Output 750 ml  Net 491.2 ml   LBM: Last BM Date: 12/09/18 Baseline Weight: Weight: 66.5 kg Most recent weight: Weight: 59.4 kg      PPS 40% Palliative Assessment/Data:  Flowsheet Rows     Most Recent Value  Intake Tab  Referral Department  Hospitalist  Unit at Time of Referral  ICU  Palliative Care Primary Diagnosis  Cancer  Date Notified  11/27/18  Palliative Care Type  New Palliative care  Reason for referral  Clarify Goals of Care  Date of Admission  11/11/18  # of days IP prior to Palliative referral  16  Clinical Assessment  Psychosocial & Spiritual Assessment  Palliative Care Outcomes      Patient Active Problem List   Diagnosis Date Noted  . Total bilirubin, elevated   . Palliative care by specialist   . Goals of care, counseling/discussion   . Enteritis due to Clostridium difficile   . Streptococcal bacteremia   . Neutropenic fever ([place])   . Oral thrush   . Normocytic anemia   . Hodgkin lymphoma ([place]) 11/18/2018  . Malnutrition of moderate degree 11/16/2018  . Pancytopenia ([place])   . Unintentional weight loss 11/12/2018  . Hashimoto's thyroiditis 11/12/2018  . Goiter 11/12/2018  . Hypertension 11/12/2018  . Aortic atherosclerosis ([place]) 11/12/2018  . Fever 11/11/2018  . Splenomegaly 11/11/2018  . Liver enzyme elevation 11/11/2018  . Thrombocytopenia ([place]) 11/11/2018  . Lymphadenopathy 05/14/2018    Palliative Care Assessment & Plan   Patient Profile:    Assessment:  stage IV Hodgkin lymphoma Severe protein calorie malnutrition Recent C diff Recent neutropenic fever Normocytic anemia, patient requiring PRBC.  Generalized weakness, deconditioning.  Petechial rash.   Recommendations/Plan: Ongoing efforts to encourage patient to work with therapy, clinical  nutrition services also following, their recommendations for consideration for tube feedings noted.   Call placed and discussed with daughter Garlan Fillers:  1. She states that the patient wants to come home when discharged, she will have her brother move in and take care of her. The patient's brother had a heart attack but not recently, according to [place]. Additionally, patient's other daughter lives in [place], patient plans on going to [place] and moving in with her other daughter, in case home with brother is not possible.   2. I discussed with [name] about [place]. There is none. Patient has 3 kids, son is some what estranged, as per [place]. The patient has consistently refused HCPOA discussions, both with her family, as well as with hospital chaplains. Remains full code for now.   3. We then reviewed about artificial nutrition and hydration, at this time, [name] is not opposed to tube feedings, if it can improve the patient's nutritional status and help her take further chemotherapy.   4. Chart was reviewed and all updates provided to daughter [name], over the phone, to the best of my ability. Discussed frankly and compassionately that prognosis remains guarded, due to the patient's overall condition.     Code Status:    Code Status Orders  (From admission, onward)         Start     Ordered   11/11/18 1700  Full code  Continuous     11/11/18 1706        Code Status History    This patient has a current code status but no historical code status.       Prognosis:   guarded   Discharge Planning:  To Be Determined  Care plan was discussed with  Patient's daughter Garlan Fillers over the phone at [phone].  Thank you for allowing the Palliative Medicine Team to assist in the care of this patient.   Time  In: 12 Time Out: 12.35 Total Time 35 Prolonged Time Billed  no       Greater than 50%  of this time was spent counseling and coordinating care related to the above  assessment and plan.  Loistine Chance, MD [phone] Please contact Palliative Medicine Team phone at [phone] for questions and concerns.

## 2018-12-11 DIAGNOSIS — R21 Rash and other nonspecific skin eruption: Secondary | ICD-10-CM | POA: Diagnosis present

## 2018-12-11 LAB — COMPREHENSIVE METABOLIC PANEL
ALBUMIN: 2.2 g/dL — AB (ref 3.5–5.0)
ALT: 43 U/L (ref 0–44)
AST: 59 U/L — ABNORMAL HIGH (ref 15–41)
Alkaline Phosphatase: 335 U/L — ABNORMAL HIGH (ref 38–126)
Anion gap: 6 (ref 5–15)
BUN: <5 mg/dL — ABNORMAL LOW (ref 6–20)
CO2: 25 mmol/L (ref 22–32)
CREATININE: 0.55 mg/dL (ref 0.44–1.00)
Calcium: 7.9 mg/dL — ABNORMAL LOW (ref 8.9–10.3)
Chloride: 108 mmol/L (ref 98–111)
GFR calc Af Amer: >60 mL/min (ref >60)
GFR calc non Af Amer: >60 mL/min (ref >60)
Glucose, Bld: 98 mg/dL (ref 70–99)
Potassium: 4.1 mmol/L (ref 3.5–5.1)
Sodium: 139 mmol/L (ref 135–145)
Total Bilirubin: 1.6 mg/dL — ABNORMAL HIGH (ref 0.3–1.2)
Total Protein: 5.4 g/dL — ABNORMAL LOW (ref 6.5–8.1)

## 2018-12-11 LAB — CULTURE, BLOOD (ROUTINE X 2)
Culture: NO GROWTH
Culture: NO GROWTH

## 2018-12-11 LAB — PHOSPHORUS: Phosphorus: 1.8 mg/dL — ABNORMAL LOW (ref 2.5–4.6)

## 2018-12-11 LAB — CBC
HCT: 27.2 % — ABNORMAL LOW (ref 36.0–46.0)
Hemoglobin: 8 g/dL — ABNORMAL LOW (ref 12.0–15.0)
MCH: 28.5 pg (ref 26.0–34.0)
MCHC: 29.4 g/dL — ABNORMAL LOW (ref 30.0–36.0)
MCV: 96.8 fL (ref 80.0–100.0)
NRBC: 0 % (ref 0.0–0.2)
Platelets: 368 10*3/uL (ref 150–400)
RBC: 2.81 MIL/uL — ABNORMAL LOW (ref 3.87–5.11)
RDW: 16.9 % — ABNORMAL HIGH (ref 11.5–15.5)
WBC: 10.2 10*3/uL (ref 4.0–10.5)

## 2018-12-11 LAB — MAGNESIUM: Magnesium: 2.5 mg/dL — ABNORMAL HIGH (ref 1.7–2.4)

## 2018-12-11 NOTE — Progress Notes (Signed)
Daily Progress Note   Patient Name: Cindy Ward       Date: 12/11/2018 DOB: June 03, 1966  Age: 53 y.o. MRN#: [id number] Attending Physician: [place] Eric J, DO Primary Care Physician: [name], MD Admit Date: 11/11/2018  Reason for Consultation/Follow-up: Establishing goals of care  Subjective: Patient is awake, resting in bed, appears to be with generalized weakness, in no distress. Appears with flat affect, is asking about going home.   Length of Stay: 30  Current Medications: Scheduled Meds:  . levothyroxine  75 mcg Oral Daily  . magnesium oxide  400 mg Oral BID  . mouth rinse  15 mL Mouth Rinse BID  . multivitamin with minerals  1 tablet Oral Daily  . nebivolol  10 mg Oral Daily  . nystatin  5 mL Oral QID  . potassium chloride  40 mEq Oral TID    Continuous Infusions: . sodium chloride 1,000 mL (11/30/18 1420)  . sodium chloride    . dextrose 5 % and 0.9% NaCl 50 mL/hr at 12/11/18 0945    PRN Meds: sodium chloride, acetaminophen **OR** acetaminophen, feeding supplement, iohexol, ipratropium-albuterol, lip balm, ondansetron **OR** ondansetron (ZOFRAN) IV, oxyCODONE, phenol, protein supplement, sodium chloride flush  Physical Exam         Resting in bed Has rash Work of breathing appears to be non labored Appears weak No edema  Vital Signs: BP 117/83 (BP Location: Left Arm)   Pulse 92   Temp 97.7 F (36.5 C) (Oral)   Resp 16   Ht 5\' 8"  (1.727 m)   Wt 57.6 kg   LMP  (LMP Unknown) Comment: >10 years no period  SpO2 100%   BMI 19.31 kg/m  SpO2: SpO2: 100 % O2 Device: O2 Device: Room Air O2 Flow Rate: O2 Flow Rate (L/min): 2 L/min  Intake/output summary:   Intake/Output Summary (Last 24 hours) at 12/11/2018 1207 Last data filed at 12/11/2018 0900 Gross  per 24 hour  Intake 2746.7 ml  Output 1850 ml  Net 896.7 ml   LBM: Last BM Date: 12/09/18 Baseline Weight: Weight: 66.5 kg Most recent weight: Weight: 57.6 kg      PPS 40% Palliative Assessment/Data:    Flowsheet Rows     Most Recent Value  Intake Tab  Referral Department  Hospitalist  Unit at Time of  Referral  ICU  Palliative Care Primary Diagnosis  Cancer  Date Notified  11/27/18  Palliative Care Type  New Palliative care  Reason for referral  Clarify Goals of Care  Date of Admission  11/11/18  # of days IP prior to Palliative referral  16  Clinical Assessment  Psychosocial & Spiritual Assessment  Palliative Care Outcomes      Patient Active Problem List   Diagnosis Date Noted  . Total bilirubin, elevated   . Palliative care by specialist   . Goals of care, counseling/discussion   . Enteritis due to Clostridium difficile   . Streptococcal bacteremia   . Neutropenic fever ([place])   . Oral thrush   . Normocytic anemia   . Hodgkin lymphoma ([place]) 11/18/2018  . Malnutrition of moderate degree 11/16/2018  . Pancytopenia ([place])   . Unintentional weight loss 11/12/2018  . Hashimoto's thyroiditis 11/12/2018  . Goiter 11/12/2018  . Hypertension 11/12/2018  . Aortic atherosclerosis ([place]) 11/12/2018  . Fever 11/11/2018  . Splenomegaly 11/11/2018  . Liver enzyme elevation 11/11/2018  . Thrombocytopenia ([place]) 11/11/2018  . Lymphadenopathy 05/14/2018    Palliative Care Assessment & Plan   Patient Profile:    Assessment:  stage IV Hodgkin lymphoma Severe protein calorie malnutrition Recent C diff Recent neutropenic fever Normocytic anemia, patient requiring PRBC.  Generalized weakness, deconditioning.  Petechial rash.   Recommendations/Plan: Ongoing efforts to encourage patient to work with therapy, clinical nutrition services also following, their recommendations for consideration for tube feedings noted.   I discussed with the patient today, regarding  clinical nutrition recommendations, code status, and her overall goals of care.   Patient seems focused on wanting to go home. She states that she lives in [place], [place] and that her brother and sister in law are going to come live with her and that she has more than enough help at home.   Patient does not want NGT or PEG, she states that she is building up on her PO intake, starting with trying to take in more liquids.   Patient endorses full code, wishes to get more treatment from her oncologists.   At this time, recommend home with home health and community based palliative care to follow the patient on discharge. Patient remains at high risk of ongoing decline, decompensation and lack of meaningful recovery.       Code Status:    Code Status Orders  (From admission, onward)         Start     Ordered   11/11/18 1700  Full code  Continuous     11/11/18 1706        Code Status History    This patient has a current code status but no historical code status.       Prognosis:   guarded   Discharge Planning:  To Be Determined  Care plan was discussed with  Patient and also with her [facility] MD Dr [place]     Thank you for allowing the Palliative Medicine Team to assist in the care of this patient.   Time In: 11 Time Out: 11.35 Total Time 35 Prolonged Time Billed  no       Greater than 50%  of this time was spent counseling and coordinating care related to the above assessment and plan.  Loistine Chance, MD [phone] Please contact Palliative Medicine Team phone at [phone] for questions and concerns.

## 2018-12-11 NOTE — Progress Notes (Signed)
HEMATOLOGY-ONCOLOGY PROGRESS NOTE  SUBJECTIVE: Patient more awake and alert today.  Answers questions.  Has remained afebrile over the past 24 hours. Denies diarrhea.  Has developed petechial rash to her bilateral lower extremities.  Rash is not bothering her.     Hodgkin lymphoma ([place])   11/18/2018 Initial Diagnosis    Hodgkin lymphoma ([place])    11/19/2018 -  Chemotherapy    The patient had DOXOrubicin (ADRIAMYCIN) chemo injection 48 mg, 25 mg/m2 = 48 mg, Intravenous,  Once, 1 of 6 cycles palonosetron (ALOXI) injection 0.25 mg, 0.25 mg, Intravenous,  Once, 1 of 6 cycles pegfilgrastim-cbqv (UDENYCA) injection 6 mg, 6 mg, Subcutaneous, Once, 1 of 6 cycles dacarbazine (DTIC) 710 mg in sodium chloride 0.9 % 250 mL chemo infusion, 375 mg/m2 = 710 mg, Intravenous,  Once, 1 of 6 cycles brentuximab vedotin (ADCETRIS) 90 mg in sodium chloride 0.9 % 100 mL chemo infusion, 1.2 mg/kg = 90 mg, Intravenous,  Once, 1 of 6 cycles vinBLAStine (VELBAN) 11.3 mg in sodium chloride 0.9 % 50 mL chemo infusion, 6 mg/m2 = 11.3 mg, Intravenous, Once, 1 of 6 cycles fosaprepitant (EMEND) 150 mg, dexamethasone (DECADRON) 12 mg in sodium chloride 0.9 % 145 mL IVPB, , Intravenous,  Once, 1 of 6 cycles  for chemotherapy treatment.      REVIEW OF SYSTEMS:   Constitutional: Denies fevers, chills Eyes: Denies blurriness of vision Ears, nose, mouth, throat, and face: Denies mucositis or sore throat Respiratory: Denies cough, dyspnea or wheezes Cardiovascular: Denies palpitation, chest discomfort Gastrointestinal:  Denies nausea, heartburn or change in bowel habits Skin: Rash noted to bilateral lower extremities and upper extremities.  Rash is not pruritic or painful. Lymphatics: Denies new lymphadenopathy or easy bruising Neurological:Denies numbness, tingling or new weaknesses Behavioral/Psych: Mood is stable, no new changes  Extremities: No lower extremity edema All other systems were reviewed with the patient and are  negative.  I have reviewed the past medical history, past surgical history, social history and family history with the patient and they are unchanged from previous note.   PHYSICAL EXAMINATION: ECOG PERFORMANCE STATUS: 3 - Symptomatic, >50% confined to bed  Vitals:   12/10/18 2132 12/11/18 0525  BP: 125/85 117/83  Pulse: 97 92  Resp: 18 16  Temp: 98 F (36.7 C) 97.7 F (36.5 C)  SpO2: 99% 100%   Filed Weights   12/08/18 0500 12/09/18 0830 12/11/18 0500  Weight: 137 lb 12.6 oz (62.5 kg) 131 lb (59.4 kg) 127 lb (57.6 kg)    GENERAL:alert, no distress and comfortable SKIN: See photos below for rashes to bilateral lower extremities and upper arms. EYES: normal, Conjunctiva are pink and non-injected, sclera clear OROPHARYNX:no exudate, no erythema and lips, buccal mucosa, and tongue normal  NECK: supple, thyroid normal size, non-tender, without nodularity LYMPH:  no palpable lymphadenopathy in the cervical, axillary or inguinal LUNGS: clear to auscultation and percussion with normal breathing effort HEART: regular rate & rhythm and no murmurs and no lower extremity edema ABDOMEN:abdomen soft, non-tender and normal bowel sounds Musculoskeletal:no cyanosis of digits and no clubbing  NEURO: alert & oriented x 3   Lower extremities:   Left arm:   Right arm:     LABORATORY DATA:  I have reviewed the data as listed CMP Latest Ref Rng & Units 12/11/2018 12/10/2018 12/09/2018  Glucose 70 - 99 mg/dL 98 157(H) 94  BUN 6 - 20 mg/dL <5(L) 5(L) 7  Creatinine 0.44 - 1.00 mg/dL 0.55 0.39(L) 0.62  Sodium 135 - 145  mmol/L 139 141 139  Potassium 3.5 - 5.1 mmol/L 4.1 2.9(L) 3.3(L)  Chloride 98 - 111 mmol/L 108 111 105  CO2 22 - 32 mmol/L 25 24 27   Calcium 8.9 - 10.3 mg/dL 7.9(L) 7.0(L) 8.0(L)  Total Protein 6.5 - 8.1 g/dL 5.4(L) - 5.3(L)  Total Bilirubin 0.3 - 1.2 mg/dL 1.6(H) - 1.8(H)  Alkaline Phos 38 - 126 U/L 335(H) - 386(H)  AST 15 - 41 U/L 59(H) - 58(H)  ALT 0 - 44 U/L 43 -  43    Lab Results  Component Value Date   WBC 10.2 12/11/2018   HGB 8.0 (L) 12/11/2018   HCT 27.2 (L) 12/11/2018   MCV 96.8 12/11/2018   PLT 368 12/11/2018   NEUTROABS 5.6 12/05/2018    ASSESSMENT AND PLAN: 1.  Stage IV Hodgkin lymphoma -Port placed in 10/2018; TTE showed normal LVEF -The patient received day 1 of cycle 1 ofAVD + Adcetris on 11/19/2018. -Status post Granix. -White blood cell count and platelet count have now recovered. -Recommend PRN anti-emetics, including Zofran and Compazine -CT scan of the abdomen pelvis showed the intra-abdominal lymph nodes appear to be improving. Spleen is decreased in size. -Next dose of chemo will be given as an outpatient.  Date to be determined.  Normocytic anemia -Likely secondary to anemia of chronic disease and underlying lymphoma -Hemoglobinis7.8. -Supportive transfusion to keep Hgb >7. Blood products to be irradiated.   Thrombocytopenia -resolved -Daily CBC -Goal plts >10k in the absence of bleeding. Blood products to be irradiated.  Neutropenic fever  -Secondary to underlying lymphoma,bacteremiaand C. diff colitis -Patient had+ blood cultures (E Coli and Group B Strep). Repeat cultures negative to date. -Completed ceftriaxone -Remains afebrile.  Severe protein malnutrition -Patient has lost > 20 lbs since the onset of her symptoms, and her most recent albumin was 2.0 with anasarca, consistent with severe protein malnutrition -Dietitian is following. -The patient is now refusing to eat. -If her nutrition remains very poor and she is unable to increase her PO intake, we will have to consider enteral nutrition, such as PEG tube, to help improve her nutritional intake.  Diarrhea with abdominal discomfort- Resolved -Stool forC.diffpositive.  -Continue oral vancomycin for 14 days per ID. Completed on 12/10/2018.  -CT the abdomen pelvis did not show any concerning findings in the left lower quadrant of  her abdomen. -Abdominal pain has resolved. Stools are now formed.  -Continue pain medsas needed.  Electrolyte abnormalities -Electrolyte abnormalities overall improved. Replete per primary team.  Hyperbilirubinemia -Unclear etiology. CT scan and abdominal U/Sdid not show any focal liver abnormality or obstruction. Possibly reactive.  -Total bilirubin is trending down. -GI has signed off.  Urinary retention -Urology has been consulted.  Will follow up with urology as an outpatient.  Deconditioning -The patient is more alert today.  Seems to be willing to work with therapy. -The patient is overall deconditioned and functional status is marginal.  May be challenging to give her additional chemotherapy if functional status does not continue to improve.  We will reevaluate the patient as an outpatient to determine when the next dose of chemotherapy will be administered. -Palliative care is following the patient to address goals of care.  Petechial rash -Unclear etiology -possibly drug related?  Discussed with Dr. Maylon Peppers and unlikely due to her underlying cancer.  Also not likely due to her chemotherapy as it has been over 3 weeks since she is received a dose. -Platelet count is completely normal.  Note work-up that has already  been completed.  Work-up unremarkable except for elevated CRP. -Dermatology referral recommended but they do not come to the hospital.  Consider dermatology follow-up as an outpatient if the rash persists.   Mikey Bussing, DNP, AGPCNP-BC, AOCNP

## 2018-12-11 NOTE — Progress Notes (Signed)
Pt is self pay, [facility] will only provide HHPT for home service.

## 2018-12-11 NOTE — Progress Notes (Signed)
PROGRESS NOTE  Cindy Ward AST:[id number] DOB: 12-30-1965 DOA: 11/11/2018 PCP: [name], MD   LOS: 30 days   Brief narrative:  53 year old female with history of essential hypertension, hypothyroidism initially presented to [facility] with complaints of recurrent nausea, vomiting, fever and night sweats.  Upon extensive work-up, she was diagnosed with Hodgkin's lymphoma, stage IV.  Oncology team was consulted and patient was transferred here for further care and management.  Lymph node biopsy was consistent with classic Hodgkin's lymphoma.  Hospital course has been complicated by bacteremia, intermittent encephalopathy, severe protein calorie malnutrition and electrolyte imbalance.    Subjective:  Patient seen and examined at bedside, resting comfortably in bed.  Currently eating breakfast.  Tolerating diet without issue.  States feels "slightly stronger today".  Requests when he can discharge home.  Continues with bilateral lower extremity rash, unchanged since yesterday not progressing.  No other complaints or concerns at this time.  No family present, updated daughter [name] by telephone this afternoon.  Fever/chills/night sweats, no visual changes, no chest pain, no palpitations, no shortness of breath, no abdominal pain, no issues with bowel/bladder function, no cough/congestion, no paresthesias.  No acute events overnight per nursing staff.  Assessment/Plan:  Principal Problem:   Hodgkin lymphoma (HCC) Active Problems:   Lymphadenopathy   Fever   Splenomegaly   Liver enzyme elevation   Thrombocytopenia (HCC)   Unintentional weight loss   Hashimoto's thyroiditis   Goiter   Hypertension   Aortic atherosclerosis (HCC)   Pancytopenia (HCC)   Malnutrition of moderate degree   Normocytic anemia   Neutropenic fever (HCC)   Oral thrush   Enteritis due to Clostridium difficile   Streptococcal bacteremia   Palliative care by specialist   Goals of care,  counseling/discussion   Total bilirubin, elevated  Stage IV Hodgkin's lymphoma, new diagnosis Patient underwent port placement on 2/24 and chemotherapy day 1 of cycle 1 ofAVD + Adcetris on 2/25.  Received Granix while inpatient due to pancytopenia with recovery of white blood cell count and platelet count.  Plans for second round of chemotherapy outpatient when patient is much stronger and total bilirubin is at least below 2.0 per Dr Maylon Peppers, oncology.      Lower extremity multiple petechial rash,  New diagnosis  Undefined etiology at this time.  Platelet count normal.  I spoke with oncology and oncology do not believe that lymphoma could cause this.  Unlikely to be ITP/TTP/ DIC.  Could be small vessel vasculitis.  Dermatology is not available in the hospital.    Will need to closely monitor.  C4 complement level is within normal limits.  HIV nonreactive.   ANCA negative.  Hepatitis panel negative.  RA factor negative.  At this time, patient might just need observation.  May need outpatient dermatology evaluation following discharge.  Intermittent metabolic encephalopathy .  Resolved at this time.  Fever.  T Max of 98.8 F. Completed IV Rocephin for E. coli bacteremia.  Blood cultures from 12/06/2018 are negative so far.   Repeat urine analysis was negative for infection.  Foley catheter which has been removed 12/09/2018. Chest x-ray was negative for any acute infiltrate.    Left breast erythema without induration.  Resolved.  Hypokalemia She has had significant hypokalemia despite multiple runs of potassium and magnesium supplements over the course of the hospitalization.  Continue on oral potassium as well.    Pancytopenia with mild rectal/vaginal bleeding.  Resolved.    Sinus tachycardia: improved with hydration and beta-blockers.  Bacteremia with E. coli and group B strep with neutropenia C. difficile colitis, resolved. Completed Rocephin IV, completed 14-day course of oral vancomycin as  per infectious disease recommendation.  No further diarrheal episodes.  Repeat blood culture from 12/06/2018 with no growth .   Elevated Total Bilirubin Etiology likely secondary to due to hepatotoxic meds. CT abdomen pelvis was negative for any acute pathology including right upper quadrant ultrasound.   Bilirubin has trended down to 1.6 today.  Severe protein calorie malnutrition Patient has been more alert awake over the last 2 to 3 days and has been starting to eat better than before.  Tolerating diet.  I had a prolonged discussion with the patient's daughter [name] on the phone regarding the alternative modes of nutrition.  Palliative care following.  Patient and daughter Anderson Malta does not wish her to have a feeding tube unless is necessary especially in the context of recent neutropenia, fever, immunosuppressed status.  If the patient continues to improve her oral intake it might be reasonable not to place a feeding tube but if her nutrition continues to be poor, may need to consider.    Acute urinary retention with bilateral hydroureteronephrosis Foley catheter removed 3/16.  Will closely monitor.  Mild dysphagia with oral thrush --Continue nystatin  Hypothyroidism -Continue Synthroid  Generalized debility PT on board.  Recommended skilled nursing facility placement but patient does not have payor source for rehabilitation. Medicaid application pending.  Continue to ambulate the patient.    We will continue to replenish electrolytes aggressively. Plan is to discharge patient home hopefully tomorrow as she continues to eat better and ambulate.  Patient will likely not have physical therapy and occupational therapy at home.  We will continue to ambulate while in hospital.  VTE Prophylaxis: SCD Code Status: Full code Family Communication: I spoke with the patient's  daughter [name] on the phone at length today.    Disposition Plan: If the patient continues to improve, plan is  disposition home likely tomorrow.  Patient states that she wishes to be with her brother.  Consultants:  Pulmonary, oncology, GI, palliative care  Procedures:  Bx 2/21  Port cath 2/24  Antibiotics: Anti-infectives (From admission, onward)   Start     Dose/Rate Route Frequency Ordered Stop   11/27/18 1130  vancomycin (VANCOCIN) 50 mg/mL oral solution 125 mg     125 mg Oral 4 times daily 11/27/18 1051 12/10/18 2359   11/27/18 0915  metroNIDAZOLE (FLAGYL) IVPB 500 mg  Status:  Discontinued     500 mg 100 mL/hr over 60 Minutes Intravenous Every 8 hours 11/27/18 0912 11/27/18 1051   11/26/18 0200  cefTRIAXone (ROCEPHIN) 2 g in sodium chloride 0.9 % 100 mL IVPB     2 g 200 mL/hr over 30 Minutes Intravenous Daily at bedtime 11/26/18 0059 12/06/18 0700   11/25/18 1200  fluconazole (DIFLUCAN) IVPB 200 mg  Status:  Discontinued     200 mg 100 mL/hr over 60 Minutes Intravenous Every 24 hours 11/25/18 0836 12/02/18 1255   11/25/18 1000  vancomycin (VANCOCIN) IVPB 1000 mg/200 mL premix  Status:  Discontinued     1,000 mg 200 mL/hr over 60 Minutes Intravenous Every 12 hours 11/25/18 0848 11/26/18 0827   11/25/18 1000  ceFEPIme (MAXIPIME) 2 g in sodium chloride 0.9 % 100 mL IVPB  Status:  Discontinued     2 g 200 mL/hr over 30 Minutes Intravenous Every 8 hours 11/25/18 0848 11/26/18 0058   11/25/18 0900  vancomycin (VANCOCIN)  1,500 mg in sodium chloride 0.9 % 500 mL IVPB     1,500 mg 250 mL/hr over 120 Minutes Intravenous  Once 11/25/18 0848 11/25/18 1135   11/24/18 1500  fluconazole (DIFLUCAN) tablet 200 mg  Status:  Discontinued     200 mg Oral Daily 11/24/18 1450 11/25/18 0836   11/18/18 0600  ceFAZolin (ANCEF) IVPB 2g/100 mL premix     2 g 200 mL/hr over 30 Minutes Intravenous To Radiology 11/17/18 1243 11/18/18 1130   11/12/18 0800  cefTRIAXone (ROCEPHIN) 2 g in sodium chloride 0.9 % 100 mL IVPB  Status:  Discontinued     2 g 200 mL/hr over 30 Minutes Intravenous Every 24 hours  11/11/18 1711 11/12/18 1057     Objective: Vitals:   12/11/18 0525 12/11/18 1341  BP: 117/83 135/72  Pulse: 92 80  Resp: 16 14  Temp: 97.7 F (36.5 C) 98.1 F (36.7 C)  SpO2: 100% 100%    Intake/Output Summary (Last 24 hours) at 12/11/2018 1507 Last data filed at 12/11/2018 0900 Gross per 24 hour  Intake 1578.74 ml  Output 1850 ml  Net -271.26 ml   Filed Weights   12/08/18 0500 12/09/18 0830 12/11/18 0500  Weight: 62.5 kg 59.4 kg 57.6 kg   Body mass index is 19.31 kg/m.   Physical Exam: General:   not in obvious distress, thinly built.  Alert awake and communicative. HENT: Normocephalic, pupils equally reacting to light and accommodation.  Mild pallor noted. Oral mucosa is moist.  Chest:  Clear breath sounds.  Diminished breath sounds bilaterally. No crackles or wheezes.  Axillary area with a lymph node.  Left breast erythema improved CVS: S1 &S2 heard. No murmur.  Regular rate and rhythm. Abdomen: Soft, nontender, nondistended.  Bowel sounds are heard.  Liver is not palpable, no abdominal mass palpated Extremities: No cyanosis, clubbing or edema.  Peripheral pulses are palpable.  Multiple petechial rashes noted which is not blanchable. Psych: Alert, awake and communicative. CNS:  No cranial nerve deficits.  Power equal in all extremities.  No sensory deficits noted.  No cerebellar signs.  SKIN: Multiple lower extremity petechial rash noted.  Mild erythematous rash noted on the upper extremities.   Data Review: I have personally reviewed the following laboratory data and studies,  CBC: Recent Labs  Lab 12/05/18 0425  12/07/18 0309 12/08/18 0413 12/09/18 0428 12/09/18 1756 12/10/18 0840 12/11/18 0443  WBC 7.5   < > 5.4 4.5 5.3  --  6.5 10.2  NEUTROABS 5.6  --   --   --   --   --   --   --   HGB 8.3*   < > 7.7* 7.7* 7.9*  --  7.8* 8.0*  HCT 25.9*   < > 25.3* 25.5* 26.0*  --  25.4* 27.2*  MCV 90.6   < > 93.0 94.1 94.2  --  94.8 96.8  PLT 181   < > 224 236 269 285  321 368   < > = values in this interval not displayed.   Basic Metabolic Panel: Recent Labs  Lab 12/06/18 1416 12/07/18 0309 12/08/18 0413 12/09/18 0428 12/10/18 0405 12/11/18 0443  NA 140 138 137 139 141 139  K 3.7 3.0* 3.1* 3.3* 2.9* 4.1  CL 98 97* 100 105 111 108  CO2 32 32 28 27 24 25   GLUCOSE 103* 110* 95 94 157* 98  BUN 11 10 9 7  5* <5*  CREATININE 0.63 0.64 0.58 0.62 0.39* 0.55  CALCIUM  9.5 9.0 8.1* 8.0* 7.0* 7.9*  MG 2.3 1.8 1.7 2.4  --  2.5*  PHOS  --  2.9  --   --   --  1.8*   Liver Function Tests: Recent Labs  Lab 12/05/18 0425 12/06/18 0440 12/07/18 0309 12/09/18 0428 12/11/18 0443  AST 60* 59* 51* 58* 59*  ALT 63* 57* 50* 43 43  ALKPHOS 710* 613* 421* 386* 335*  BILITOT 3.5* 2.7* 2.1* 1.8* 1.6*  PROT 5.0* 5.2* 5.0* 5.3* 5.4*  ALBUMIN 1.8* 2.0* 1.9* 2.1* 2.2*   No results for input(s): LIPASE, AMYLASE in the last 168 hours. Recent Labs  Lab 12/05/18 1355  AMMONIA 10   Cardiac Enzymes: No results for input(s): CKTOTAL, CKMB, CKMBINDEX, TROPONINI in the last 168 hours. BNP (last 3 results) Recent Labs    11/21/18 1519  BNP 1,731.6*    ProBNP (last 3 results) No results for input(s): PROBNP in the last 8760 hours.  CBG: No results for input(s): GLUCAP in the last 168 hours. Recent Results (from the past 240 hour(s))  Culture, blood (routine x 2)     Status: None   Collection Time: 12/06/18  1:00 PM  Result Value Ref Range Status   Specimen Description   Final    BLOOD RIGHT ARM Performed at [facility], [address] [place], [place] [address]    Special Requests   Final    BOTTLES DRAWN AEROBIC ONLY Blood Culture results may not be optimal due to an inadequate volume of blood received in culture bottles Performed at [facility] [address], [place], [place] [address]    Culture   Final    NO GROWTH 5 DAYS Performed at [facility], [place] [address], [place], [place] [address]    Report Status  12/11/2018 FINAL  Final  Culture, blood (routine x 2)     Status: None   Collection Time: 12/06/18  1:00 PM  Result Value Ref Range Status   Specimen Description   Final    BLOOD LEFT ARM Performed at [facility], [place] [address], [place], [place] [address]    Special Requests   Final    BOTTLES DRAWN AEROBIC ONLY Blood Culture results may not be optimal due to an inadequate volume of blood received in culture bottles Performed at [facility] [address] [place], [place] [address]    Culture   Final    NO GROWTH 5 DAYS Performed at [facility], [place] [address] [place], [place] [address]    Report Status 12/11/2018 FINAL  Final     Studies: No results found.  Scheduled Meds: . levothyroxine  75 mcg Oral Daily  . magnesium oxide  400 mg Oral BID  . mouth rinse  15 mL Mouth Rinse BID  . multivitamin with minerals  1 tablet Oral Daily  . nebivolol  10 mg Oral Daily  . nystatin  5 mL Oral QID  . potassium chloride  40 mEq Oral TID    Continuous Infusions: . sodium chloride 1,000 mL (11/30/18 1420)  . sodium chloride    . dextrose 5 % and 0.9% NaCl 50 mL/hr at 12/11/18 0945     Verneda Hollopeter J [place] DO  [facility] Pager [phone]   If 7PM-7AM, please contact night-coverage [email or web address] Password [facility] 12/11/2018

## 2018-12-12 LAB — COMPREHENSIVE METABOLIC PANEL
ALT: 41 U/L (ref 0–44)
AST: 52 U/L — ABNORMAL HIGH (ref 15–41)
Albumin: 2.3 g/dL — ABNORMAL LOW (ref 3.5–5.0)
Alkaline Phosphatase: 309 U/L — ABNORMAL HIGH (ref 38–126)
Anion gap: 7 (ref 5–15)
CO2: 25 mmol/L (ref 22–32)
Calcium: 8 mg/dL — ABNORMAL LOW (ref 8.9–10.3)
Chloride: 107 mmol/L (ref 98–111)
Creatinine, Ser: 0.55 mg/dL (ref 0.44–1.00)
GFR calc Af Amer: >60 mL/min (ref >60)
Glucose, Bld: 97 mg/dL (ref 70–99)
Potassium: 3.7 mmol/L (ref 3.5–5.1)
Sodium: 139 mmol/L (ref 135–145)
Total Bilirubin: 1.5 mg/dL — ABNORMAL HIGH (ref 0.3–1.2)
Total Protein: 5.6 g/dL — ABNORMAL LOW (ref 6.5–8.1)

## 2018-12-12 LAB — CBC
HCT: 27.3 % — ABNORMAL LOW (ref 36.0–46.0)
Hemoglobin: 8.2 g/dL — ABNORMAL LOW (ref 12.0–15.0)
MCH: 29 pg (ref 26.0–34.0)
MCHC: 30 g/dL (ref 30.0–36.0)
MCV: 96.5 fL (ref 80.0–100.0)
Platelets: 427 10*3/uL — ABNORMAL HIGH (ref 150–400)
RBC: 2.83 MIL/uL — ABNORMAL LOW (ref 3.87–5.11)
RDW: 16.9 % — ABNORMAL HIGH (ref 11.5–15.5)
WBC: 13.5 10*3/uL — ABNORMAL HIGH (ref 4.0–10.5)
nRBC: 0 % (ref 0.0–0.2)

## 2018-12-12 LAB — MAGNESIUM: MAGNESIUM: 2.3 mg/dL (ref 1.7–2.4)

## 2018-12-12 MED ORDER — MAGNESIUM OXIDE 400 (241.3 MG) MG PO TABS: 400.0000 mg | ORAL_TABLET | Freq: Two times a day (BID) | ORAL | 0 refills | Status: AC

## 2018-12-12 MED ORDER — HEPARIN SOD (PORK) LOCK FLUSH 100 UNIT/ML IV SOLN
500.0000 [IU] | INTRAVENOUS | Status: AC | PRN
  Administered 2018-12-12: 500 [IU]

## 2018-12-12 MED ORDER — OXYCODONE HCL 5 MG PO TABS: 5.0000 mg | ORAL_TABLET | ORAL | 0 refills | Status: DC | PRN

## 2018-12-12 MED ORDER — NYSTATIN 100000 UNIT/ML MT SUSP: 5.0000 mL | Freq: Four times a day (QID) | OROMUCOSAL | 0 refills | Status: AC

## 2018-12-12 MED ORDER — POTASSIUM CHLORIDE 20 MEQ PO PACK: 40.0000 meq | PACK | Freq: Three times a day (TID) | ORAL | 0 refills | Status: DC

## 2018-12-12 NOTE — TOC Transition Note (Signed)
Transition of Care [facility]) - CM/SW Discharge Note   Patient Details  Name: Cindy Ward MRN: [id number] Date of Birth: 01/09/1966  Transition of Care [facility]) CM/SW Contact:  Dessa Phi, RN Phone Number: 12/12/2018, 12:03 PM   Clinical Narrative:   D/c home w/[facility]-HHPT-rep [facility] Glad already following for chairty HHPT. Paitent has pcp.No further CM needs.    Final next level of care: [place] Barriers to Discharge: No Barriers Identified   Patient Goals and CMS Choice Patient states their goals for this hospitalization and ongoing recovery are:: figure out how to get home      Discharge Placement                       Discharge Plan and [place]   Discharge Planning Services: NA                      Social Determinants of Health (SDOH) Interventions     Readmission Risk Interventions No flowsheet data found.

## 2018-12-12 NOTE — Progress Notes (Signed)
HEMATOLOGY-ONCOLOGY PROGRESS NOTE  SUBJECTIVE: Patient resting quietly. Has remained afebrile over the past 24 hours. Has no diarrhea.  Has ongoing petechial rash to her bilateral lower extremities.  Rash is not bothering her.     Hodgkin lymphoma ([place])   11/18/2018 Initial Diagnosis    Hodgkin lymphoma ([place])    11/19/2018 -  Chemotherapy    The patient had DOXOrubicin (ADRIAMYCIN) chemo injection 48 mg, 25 mg/m2 = 48 mg, Intravenous,  Once, 1 of 6 cycles palonosetron (ALOXI) injection 0.25 mg, 0.25 mg, Intravenous,  Once, 1 of 6 cycles pegfilgrastim-cbqv (UDENYCA) injection 6 mg, 6 mg, Subcutaneous, Once, 1 of 6 cycles dacarbazine (DTIC) 710 mg in sodium chloride 0.9 % 250 mL chemo infusion, 375 mg/m2 = 710 mg, Intravenous,  Once, 1 of 6 cycles brentuximab vedotin (ADCETRIS) 90 mg in sodium chloride 0.9 % 100 mL chemo infusion, 1.2 mg/kg = 90 mg, Intravenous,  Once, 1 of 6 cycles vinBLAStine (VELBAN) 11.3 mg in sodium chloride 0.9 % 50 mL chemo infusion, 6 mg/m2 = 11.3 mg, Intravenous, Once, 1 of 6 cycles fosaprepitant (EMEND) 150 mg, dexamethasone (DECADRON) 12 mg in sodium chloride 0.9 % 145 mL IVPB, , Intravenous,  Once, 1 of 6 cycles  for chemotherapy treatment.      REVIEW OF SYSTEMS:   Constitutional: Denies fevers, chills Eyes: Denies blurriness of vision Ears, nose, mouth, throat, and face: Denies mucositis or sore throat Respiratory: Denies cough, dyspnea or wheezes Cardiovascular: Denies palpitation, chest discomfort Gastrointestinal:  Denies nausea, heartburn or change in bowel habits Skin: Rash noted to bilateral lower extremities and upper extremities.  Rash is not pruritic or painful. Lymphatics: Denies new lymphadenopathy or easy bruising Neurological:Denies numbness, tingling or new weaknesses Behavioral/Psych: Mood is stable, no new changes  Extremities: No lower extremity edema All other systems were reviewed with the patient and are negative.  I have reviewed the  past medical history, past surgical history, social history and family history with the patient and they are unchanged from previous note.   PHYSICAL EXAMINATION: ECOG PERFORMANCE STATUS: 3 - Symptomatic, >50% confined to bed  Vitals:   12/11/18 1341 12/12/18 0512  BP: 135/72 123/80  Pulse: 80 90  Resp: 14 16  Temp: 98.1 F (36.7 C) 97.8 F (36.6 C)  SpO2: 100% 100%   Filed Weights   12/09/18 0830 12/11/18 0500 12/12/18 0512  Weight: 131 lb (59.4 kg) 127 lb (57.6 kg) 126 lb 8 oz (57.4 kg)    GENERAL:alert, no distress and comfortable SKIN: Petechial rash to the bilateral lower extremities and bilateral upper extremities.  Unchanged. EYES: normal, Conjunctiva are pink and non-injected, sclera clear OROPHARYNX:no exudate, no erythema and lips, buccal mucosa, and tongue normal  NECK: supple, thyroid normal size, non-tender, without nodularity LYMPH:  no palpable lymphadenopathy in the cervical, axillary or inguinal LUNGS: clear to auscultation and percussion with normal breathing effort HEART: regular rate & rhythm and no murmurs and no lower extremity edema ABDOMEN:abdomen soft, non-tender and normal bowel sounds Musculoskeletal:no cyanosis of digits and no clubbing  NEURO: alert & oriented x 3  LABORATORY DATA:  I have reviewed the data as listed CMP Latest Ref Rng & Units 12/12/2018 12/11/2018 12/10/2018  Glucose 70 - 99 mg/dL 97 98 157(H)  BUN 6 - 20 mg/dL <5(L) <5(L) 5(L)  Creatinine 0.44 - 1.00 mg/dL 0.55 0.55 0.39(L)  Sodium 135 - 145 mmol/L 139 139 141  Potassium 3.5 - 5.1 mmol/L 3.7 4.1 2.9(L)  Chloride 98 - 111 mmol/L 107  108 111  CO2 22 - 32 mmol/L 25 25 24   Calcium 8.9 - 10.3 mg/dL 8.0(L) 7.9(L) 7.0(L)  Total Protein 6.5 - 8.1 g/dL 5.6(L) 5.4(L) -  Total Bilirubin 0.3 - 1.2 mg/dL 1.5(H) 1.6(H) -  Alkaline Phos 38 - 126 U/L 309(H) 335(H) -  AST 15 - 41 U/L 52(H) 59(H) -  ALT 0 - 44 U/L 41 43 -    Lab Results  Component Value Date   WBC 13.5 (H) 12/12/2018    HGB 8.2 (L) 12/12/2018   HCT 27.3 (L) 12/12/2018   MCV 96.5 12/12/2018   PLT 427 (H) 12/12/2018   NEUTROABS 5.6 12/05/2018    ASSESSMENT AND PLAN: 1.  Stage IV Hodgkin lymphoma -Port placed in 10/2018; TTE showed normal LVEF -The patient received day 1 of cycle 1 ofAVD + Adcetris on 11/19/2018. -Status post Granix. -White blood cell count and platelet count have now recovered. -Recommend PRN anti-emetics, including Zofran and Compazine -CT scan of the abdomen pelvis showed the intra-abdominal lymph nodes appear to be improving. Spleen is decreased in size. -Next dose of chemo will be given as an outpatient.  Date to be determined.  Normocytic anemia -Likely secondary to anemia of chronic disease and underlying lymphoma -Hemoglobinis8.2.  No transfusion is indicated today.Supportive transfusion to keep Hgb >7. Blood products to be irradiated.   Thrombocytopenia -resolved -Daily CBC -Goal plts >10k in the absence of bleeding. Blood products to be irradiated.  Neutropenic fever  -Secondary to underlying lymphoma,bacteremiaand C. diff colitis -Patient had+ blood cultures (E Coli and Group B Strep). Repeat cultures negative to date. -Completed ceftriaxone -Remains afebrile.  Severe protein malnutrition -Patient has lost > 20 lbs since the onset of her symptoms, and her most recent albumin was 2.0 with anasarca, consistent with severe protein malnutrition -Dietitian is following. -The patient is now refusing to eat. -If her nutrition remains very poor and she is unable to increase her PO intake, we will have to consider enteral nutrition, such as PEG tube, to help improve her nutritional intake.  Diarrhea with abdominal discomfort- Resolved -Stool forC.diffpositive.  -Continue oral vancomycin for 14 days per ID. Completed on 12/10/2018.  -CT the abdomen pelvis did not show any concerning findings in the left lower quadrant of her abdomen. -Abdominal pain has  resolved. Stools are now formed.  -Continue pain medsas needed.  Electrolyte abnormalities -Electrolyte abnormalities overall improved.  Potassium and magnesium are normal today.  Corrected calcium is 9.0.  Hyperbilirubinemia -Unclear etiology. CT scan and abdominal U/Sdid not show any focal liver abnormality or obstruction. Possibly reactive.  -Total bilirubin is trending down. -GI has signed off.  Urinary retention -Urology has been consulted.  Will follow up with urology as an outpatient.  Deconditioning -The patient is more alert today.  Seems to be willing to work with therapy. -The patient is overall deconditioned and functional status is marginal.  May be challenging to give her additional chemotherapy if functional status does not continue to improve.  We will reevaluate the patient as an outpatient to determine when the next dose of chemotherapy will be administered. -Palliative care is following the patient to address goals of care. -The patient is okay to discharge from an oncology standpoint. The patient will be discharged soon with home health physical therapy.  Will arrange for outpatient follow-up at our [facility] office with Dr. Maylon Peppers once discharge date has been determined.  Petechial rash -Unclear etiology -possibly drug related?  Discussed with Dr. Maylon Peppers and unlikely due  to her underlying cancer.  Also not likely due to her chemotherapy as it has been over 3 weeks since she is received a dose. -Platelet count is completely normal.  Note work-up that has already been completed.  Work-up unremarkable except for elevated CRP. -Dermatology referral recommended but they do not come to the hospital.  Consider dermatology follow-up as an outpatient if the rash persists.   Mikey Bussing, DNP, AGPCNP-BC, AOCNP

## 2018-12-12 NOTE — Discharge Instructions (Signed)
Axillary Lymph Node Dissection, Care After This sheet gives you information about how to care for yourself after your procedure. Your health care provider may also give you more specific instructions. If you have problems or questions, contact your health care provider. What can I expect after the procedure? Hodgkin Lymphoma, Adult Hodgkin lymphoma, also called Hodgkin disease, is a cancer that happens when the white blood cells that make up your lymphatic system turn into cancer cells. Hodgkin lymphoma often affects the lymph nodes. It can spread from lymph node to lymph node and to areas of the body where there is lymph tissue, including to the center of the bones (bone marrow). Advanced Hodgkin lymphoma can spread into blood vessels and be carried almost anywhere in your body. What are the causes? The cause is not known. What increases the risk? You may be at higher risk for Hodgkin lymphoma if:  You are 2?46 years old or older than 43 years.  You are female.  You have been infected with the virus that causes mononucleosis (Epstein-Barr virus).  You have a brother or sister with Hodgkin lymphoma.  You have HIV. What are the signs or symptoms? The first sign of Hodgkin lymphoma is often a painless swelling in a lymph node. The swelling may be felt in the neck, under the arm, or in the groin. Other signs and symptoms are:  Fever.  Drenching night sweats.  Feeling tired all the time.  Cough.  Shortness of breath.  Itchy skin.  Loss of appetite.  Weight loss.  Pain in the lymph nodes after drinking alcohol. How is this diagnosed? To make a diagnosis, your health care provider will ask about your symptoms and perform a physical exam. If you have an enlarged lymph node, your health care provider may order a test in which the lymph node or a piece of it is removed and studied under a microscope (biopsy). Your health care provider may also order tests to find out how advanced the  cancer is and whether it has spread. You may have:  Blood tests to see if you have abnormal numbers of red or white blood cells.  A chest X-ray to look for swollen nodes.  Imaging tests to see if and where the cancer has spread in your body. These may include CT, MRI, or PET scans.  A biopsy of bone marrow to see if disease has spread to the bone marrow. How is this treated? Your treatment will be depend on the type of cancer cells you have, the stage of your cancer, your age, and your overall health. Treatment usually starts with one of the following:  Chemotherapy. Chemotherapy is the use of medicines to stop or slow the growth of cancer cells.  Radiation therapy. Radiation therapy is the use of high-energy X-rays to kill cancer cells.  A combination of chemotherapy and radiation therapy. If chemotherapy and radiation therapy are not completely successful, you may have treatment with:  Man-made proteins called monoclonal antibodies. These proteins attack cancer cells.  Very high doses of chemotherapy followed by a stem cell transplant. Stem cells are cells that can develop into other types of cells. Follow these instructions at home:  Take medicines only as directed by your health care provider.  Make sure you are getting enough sleep on a regular basis. Most adults need 6-8 hours of sleep each night. During treatment you may need more sleep than that.  Maintain a healthy weight, eat a healthy diet, and exercise regularly.  Do not use any tobacco products, including cigarettes, chewing tobacco, or electronic cigarettes. If you need help quitting, ask your health care provider.  Consider joining a cancer support group, especially if you are struggling with the stress of dealing with cancer.  Cancer treatment may increase your risk for other cancers and for infections. After treatment: ? Make sure you get all vaccinations as recommended by your health care provider. ? Have regular  cancer screenings as recommended by your health care provider. Contact a health care provider if:  You have fever or other signs of infection.  You develop new symptoms or your old symptoms come back. Get help right away if:  You have chest pain.  You have trouble breathing. This information is not intended to replace advice given to you by your health care provider. Make sure you discuss any questions you have with your health care provider. Document Released: 12/19/2007 Document Revised: 02/17/2016 Document Reviewed: 07/22/2015 Elsevier Interactive Patient Education  [employer].  After the procedure, it is common to have:  Pain and soreness around your incision area.  Trouble moving your arm or shoulder.  A small amount of swelling in your arm.  Numbness on the upper and inside parts of your arm. Follow these instructions at home: Medicines  Take over-the-counter and prescription medicines only as told by your health care provider.  If you were prescribed an antibiotic medicine, take it as told by your health care provider. Do not stop taking the antibiotic even if you start to feel better. Incision care   Follow instructions from your health care provider about how to take care of your incision. Make sure you: ? Wash your hands with soap and water before you change your bandage (dressing). If soap and water are not available, use hand sanitizer. ? Change your dressing as told by your health care provider. ? Leave stitches (sutures), skin glue, or adhesive strips in place. These skin closures may need to stay in place for 2 weeks or longer. If adhesive strip edges start to loosen and curl up, you may trim the loose edges. Do not remove adhesive strips completely unless your health care provider tells you to do that.  Check your incision area every day for signs of infection. Check for: ? Redness, swelling, or pain. ? Fluid or blood. ? Warmth. ? Pus or a bad  smell. Activity  Do arm and shoulder exercises as told by your health care provider. This may prevent movement problems and stiffness.  Return to your normal activities as told by your health care provider. Ask your health care provider what activities are safe for you. Avoid any activities that cause pain. Driving  Do not drive for 24 hours if you were given a medicine to help you relax (sedative).  Do not drive or use heavy machinery while taking prescription pain medicine. General instructions  If a drainage tube was left in your breast, care for it as told by your health care provider. The drain may stay in place for up to 7-10 days.  Wear a compression garment on your arm as told by your health care provider. This may help to prevent blood clots and reduce swelling in your arm.  Do not use any products that contain nicotine or tobacco, such as cigarettes and e-cigarettes. If you need help quitting, ask your health care provider.  Do not take baths, swim, or use a hot tub until your health care provider approves. Ask your health  care provider if you may take showers. You may only be allowed to take sponge baths for bathing.  Do not have your blood pressure taken, have blood drawn, or get injections or IVs in the arm on the side where your lymph nodes were removed.  Follow instructions from your health care provider about how often you should be screened for extra fluid around your lymph nodes (lymphedema).  Keep all follow-up visits as told by your health care provider. This is important. Contact a health care provider if:  Your arm is swollen, tight, and painful.  You have redness, swelling, or pain around your incision.  You have fluid or blood coming from your incision.  Your incision feels warm to the touch.  You have pus or a bad smell coming from your incision. Get help right away if:  You have severe pain that does not get better with medicine.  You have a fever or  chills.  You are confused.  You have chest pain.  You have shortness of breath. Summary  After the procedure, it is common to have pain and soreness and trouble moving your arm or shoulder.  A small amount of arm swelling is normal after the procedure. However, you should contact your health care provider if your arm is swollen, tight, and painful.  Wear a compression garment on your arm as told by your health care provider. This may help to prevent blood clots and reduce swelling in your arm.  Do arm and shoulder exercises as directed to help prevent movement problems and stiffness.  If a drainage tube was left in your breast, care for it as told by your health care provider. This information is not intended to replace advice given to you by your health care provider. Make sure you discuss any questions you have with your health care provider. Document Released: 11/08/2016 Document Revised: 04/24/2017 Document Reviewed: 11/08/2016 Elsevier Interactive Patient Education  2019 [employer].

## 2018-12-12 NOTE — Discharge Summary (Signed)
Physician Discharge Summary  Cindy Ward XWR:[id number] DOB: May 22, 1966 DOA: 11/11/2018  PCP: [name], MD  Admit date: 11/11/2018 Discharge date: 12/12/2018  Admitted From: Home Disposition: Home  Recommendations for Outpatient Follow-up:  1. Follow up with PCP in 1 week 2. Follow-up medical oncology as scheduled 3. Please obtain CMP/CBC in one week 4. Follow-up rash, may need dermatology versus biopsy by PCP for further evaluation 5. Encouraged to continue discussions regarding palliative care and goals of care as patient is unlikely to do well given her underlying diagnosis of Hodgkin's lymphoma with significant debility.  Home Health: Yes, PT (only qualifies for compassionate PT with [facility] due to lack of insurance) Equipment/Devices: none  Discharge Condition: Stable CODE STATUS: Full code Diet recommendation: Regular diet  History of present illness:  Cindy Ward is a 53 year old female with history of essential hypertension, hypothyroidism initially presented to [facility] with complaints of recurrent nausea, vomiting, fever and night sweats. Upon extensive work-up, she was diagnosed with Hodgkin's lymphoma, stage IV. Oncology team was consulted and patient was transferred here for further care and management.   Lymph node biopsy was consistent with classic Hodgkin's lymphoma. Hospital course has been complicated by bacteremia, intermittent encephalopathy, severe protein calorie malnutrition and electrolyte imbalance.   Hospital course:  Stage IV Hodgkin's lymphoma, new diagnosis Patient was transferred to [facility] and underwent lymph node biopsy with pathology consistent with classic Hodgkin's lymphoma.  Patient underwent port placement on 2/24 and chemotherapy day 1 of cycle 1 ofAVD + Adcetris on 2/25.  Received Granix while inpatient due to pancytopenia with recovery of white blood cell count and platelet count.  Plans for second  round of chemotherapy outpatient when patient is much stronger and total bilirubin is at least below 2.0 per Dr Maylon Peppers, oncology.      Extremity multiple petechial rash Undefined etiology at this time.  Platelet count normal.  I spoke with oncology and oncology do not believe that lymphoma could cause this.  Unlikely to be ITP/TTP/ DIC.  Could be small vessel vasculitis.  Dermatology is not available in the hospital.    Will need to closely monitor.  C4 complement level is within normal limits.  HIV nonreactive.   ANCA negative.  Hepatitis panel negative.  RA factor negative.  At this time, patient might just need observation.  May need outpatient dermatology evaluation following discharge versus biopsy by PCP for further evaluation after discharge.  Intermittent metabolic encephalopathy .  Resolved at this time.  Bacteremia with E. coli and group B strep with neutropenia Completed IV Rocephin for E. coli bacteremia.  Blood cultures from 12/06/2018 negative.   Repeat urine analysis was negative for infection.  Foley catheter removed 12/09/2018. Chest x-ray was negative for any acute infiltrate.    Left breast erythema without induration.  Resolved.  Hypokalemia She has had significant hypokalemia despite multiple runs of potassium and magnesium supplements over the course of the hospitalization.  Continue on oral potassium as well.    Pancytopenia with mild rectal/vaginal bleeding.  Resolved.    Normocytic anemia Etiology likely secondary to anemia of chronic disease and underlying lymphoma.  Hemoglobin 8.2 today.  Transfuse for hemoglobin less than or equal to 7, recommend blood products to be irradiated.  Thrombocytopenia, resolved Platelets of time of discharge 427.  Platelet goal greater than 10,000 in the absence of bleeding.  Recommend if transfusion required that blood products be irradiated.  Sinus tachycardia: improved with hydration and beta-blockers.  C.  difficile colitis,  resolved. Completed Rocephin IV, completed 14-day course of oral vancomycin as per infectious disease recommendation.  No further diarrheal episodes.   Elevated Total Bilirubin, improving Etiology likely secondary to due to hepatotoxic meds. CT abdomen pelvis was negative for any acute pathology including right upper quadrant ultrasound. Bilirubin has trended down to 1.5 today.  Gastroenterology was consulted for evaluation with no further recommendations and subsequently had signed off.  Severe protein calorie malnutrition Patient has been more alert awake over the last 2 to 3 days and has been starting to eat better than before.  Tolerating diet.  I had a prolonged discussion with the patient's daughter [name] on the phone regarding the alternative modes of nutrition.  Palliative care following.  Patient and daughter Anderson Malta does not wish her to have a feeding tube unless is necessary especially in the context of recent neutropenia, fever, immunosuppressed status.  If the patient continues to improve her oral intake it might be reasonable not to place a feeding tube but if her nutrition continues to be poor, may need to re-consider.    Acute urinary retention with bilateral hydroureteronephrosis Foley catheter removed 3/16.    No recurrence of urinary retention since removal.  Mild dysphagia with oral thrush Continue nystatin  Hypothyroidism Continue Synthroid  Generalized debility PT on board.  Recommended skilled nursing facility placement but patient does not have payor source for rehabilitation. Medicaid application pending.  Continue to ambulate the patient.    We will continue to replenish electrolytes aggressively. Plan is to discharge patient home with compassionate care from advanced health care with continued home physical therapy.  Patient being discharged home after prolonged hospital course with new diagnosis of Hodgkin's lymphoma status post initial round of  chemotherapy complicated by septicemia, neutropenic fever/pancytopenia, petechial macular papular rash, protein calorie malnutrition and severe debility.  Patient is a high bounce back potential given her insistence of aggressive therapy despite all these new comorbidities.   Discharge Diagnoses:  Principal Problem:   Hodgkin lymphoma ([place]) Active Problems:   Lymphadenopathy   Splenomegaly   Liver enzyme elevation   Unintentional weight loss   Hashimoto's thyroiditis   Goiter   Hypertension   Aortic atherosclerosis ([facility])   Pancytopenia ([facility])   Malnutrition of moderate degree   Normocytic anemia   Oral thrush   Palliative care by specialist   Goals of care, counseling/discussion   Total bilirubin, elevated   Rash and nonspecific skin eruption    Discharge Instructions  Discharge Instructions    Call MD for:  difficulty breathing, headache or visual disturbances   Complete by:  As directed    Call MD for:  persistant dizziness or light-headedness   Complete by:  As directed    Call MD for:  persistant nausea and vomiting   Complete by:  As directed    Call MD for:  redness, tenderness, or signs of infection (pain, swelling, redness, odor or green/yellow discharge around incision site)   Complete by:  As directed    Call MD for:  severe uncontrolled pain   Complete by:  As directed    Call MD for:  temperature >100.4   Complete by:  As directed    Diet - low sodium heart healthy   Complete by:  As directed    Increase activity slowly   Complete by:  As directed      Allergies as of 12/12/2018   No Known Allergies     Medication List  TAKE these medications   Bystolic 10 MG tablet Generic drug:  nebivolol Take 1 tablet by mouth daily.   dexamethasone 4 MG tablet Commonly known as:  DECADRON Take 2 tablets by mouth once a day on the day after chemotherapy and then take 2 tablets two times a day for 2 days. Take with food.   levothyroxine 75 MCG  tablet Commonly known as:  SYNTHROID, LEVOTHROID Take 1 tablet by mouth daily.   lidocaine-prilocaine cream Commonly known as:  EMLA Apply to affected area once   LORazepam 0.5 MG tablet Commonly known as:  Ativan Take 1 tablet (0.5 mg total) by mouth every 6 (six) hours as needed (Nausea or vomiting).   magnesium oxide 400 (241.3 Mg) MG tablet Commonly known as:  MAG-OX Take 1 tablet (400 mg total) by mouth 2 (two) times daily.   multivitamin with minerals Tabs tablet Take 1 tablet by mouth daily.   nystatin 100000 UNIT/ML suspension Commonly known as:  MYCOSTATIN Take 5 mLs (500,000 Units total) by mouth 4 (four) times daily for 10 days.   ondansetron 8 MG disintegrating tablet Commonly known as:  ZOFRAN-ODT Take 1 tablet by mouth as needed for nausea/vomiting. PRN TID   ondansetron 8 MG tablet Commonly known as:  Zofran Take 1 tablet (8 mg total) by mouth 2 (two) times daily as needed. Start on the third day after chemotherapy.   oxyCODONE 5 MG immediate release tablet Commonly known as:  Oxy IR/ROXICODONE Take 1 tablet (5 mg total) by mouth every 4 (four) hours as needed for moderate pain.   potassium chloride 20 MEQ packet Commonly known as:  KLOR-CON Take 40 mEq by mouth 3 (three) times daily.   ProAir HFA 108 (90 Base) MCG/ACT inhaler Generic drug:  albuterol Take 2 puffs by mouth 4 (four) times daily as needed.   prochlorperazine 10 MG tablet Commonly known as:  COMPAZINE Take 1 tablet (10 mg total) by mouth every 6 (six) hours as needed (Nausea or vomiting).            Durable Medical Equipment  (From admission, onward)         Start     Ordered   12/11/18 1526  For home use only DME Tub bench  Once     12/11/18 1525   12/11/18 1523  For home use only DME 3 n 1  Once     12/11/18 1525   12/11/18 1522  For home use only DME 4 wheeled rolling walker with seat  Once    Question:  Patient needs a walker to treat with the following condition  Answer:   Weakness   12/11/18 1525         Follow-up Information    Coralie Keens, MD Follow up.   Specialty:  General Surgery Why:  Call to follow up as needed for questions or concerns regarding left axillary lymph node biopsy.  Contact information: [place] [address] [phone]        Cleon Gustin, MD. Call in 1 week(s).   Specialty:  Urology Contact information: [place] [address] [phone]        [name], MD. Schedule an appointment as soon as possible for a visit in 1 week(s).   Specialty:  Family Medicine Contact information: [place]        Tish Men, MD. Call in 1 week(s).   Specialties:  Hematology, Oncology Contact information: [place] [address] [phone]          No Known Allergies  Consultations:  Palliative care  Medical oncology  General surgery for lymph node biopsy  Infectious disease  [place] [facility] [facility]   Procedures/Studies: Dg Chest 1 View  Result Date: 11/26/2018 CLINICAL DATA:  Tachypnea.  Stage IV Hodgkin's lymphoma. EXAM: CHEST  1 VIEW COMPARISON:  Chest radiograph November 25, 2018 FINDINGS: Cardiac silhouette is mildly enlarged and unchanged. Pulmonary vascular congestion with small LEFT pleural effusion. No pneumothorax. Strandy densities LEFT lung base. Surgical clips LEFT axilla. RIGHT single-lumen chest Port-A-Cath distal tip projecting mid superior vena cava. Osseous structures are unchanged. IMPRESSION: Stable cardiomegaly and pulmonary vascular congestion. Small LEFT pleural effusion and LEFT lung base atelectasis. Electronically Signed   By: Elon Alas M.D.   On: 11/26/2018 20:32   Dg Chest 2 View  Result Date: 11/21/2018 CLINICAL DATA:  History of lymphoma.  Patient weak and flushed. EXAM: CHEST - 2 VIEW COMPARISON:  Chest CT, 11/18/2018.  Chest radiographs, 11/11/2018. FINDINGS: Mild  enlargement of the cardiopericardial silhouette. No mediastinal or hilar masses. No convincing adenopathy. Small bilateral pleural effusions. Mild dependent lower lobe atelectasis. Lungs otherwise clear. No pneumothorax. Right anterior chest wall, internal jugular, Port-A-Cath is stable from the prior chest CT. Skeletal structures are unremarkable. IMPRESSION: 1. Findings are similar to the recent prior chest CT. Enlargement of the cardiopericardial silhouette is consistent with the pericardial effusion noted on CT. There are small bilateral pleural effusions with associated lower lobe atelectasis. No convincing pneumonia and no pulmonary edema. Electronically Signed   By: Lajean Manes M.D.   On: 11/21/2018 15:05   Ct Head Wo Contrast  Result Date: 12/06/2018 CLINICAL DATA:  Confusion, altered mental status. EXAM: CT HEAD WITHOUT CONTRAST TECHNIQUE: Contiguous axial images were obtained from the base of the skull through the vertex without intravenous contrast. COMPARISON:  Head CT scan 11/25/2018. FINDINGS: Brain: No evidence of acute infarction, hemorrhage, hydrocephalus, extra-axial collection or mass lesion/mass effect. Vascular: No hyperdense vessel or unexpected calcification. Skull: No fracture or focal lesion. Sinuses/Orbits: Negative. Other: None. IMPRESSION: Normal head CT. Electronically Signed   By: Inge Rise M.D.   On: 12/06/2018 10:36   Ct Head Wo Contrast  Result Date: 11/25/2018 CLINICAL DATA:  Hodgkin's lymphoma, fevers, headache EXAM: CT HEAD WITHOUT CONTRAST TECHNIQUE: Contiguous axial images were obtained from the base of the skull through the vertex without intravenous contrast. COMPARISON:  11/25/2018 FINDINGS: Brain: No evidence of acute infarction, hemorrhage, hydrocephalus, extra-axial collection or mass lesion/mass effect. Mild periventricular white matter hypodensity. Vascular: No hyperdense vessel or unexpected calcification. Skull: Normal. Negative for fracture or focal  lesion. Sinuses/Orbits: No acute finding. Other: None. IMPRESSION: No acute intracranial pathology. No non-contrast CT findings to explain headache. Mild small-vessel white matter disease. Electronically Signed   By: Eddie Candle M.D.   On: 11/25/2018 12:37   Ct Soft Tissue Neck W Contrast  Result Date: 11/18/2018 CLINICAL DATA:  Initial evaluation for Hodgkin's lymphoma, initial workup. EXAM: CT NECK WITH CONTRAST CT CHEST, ABDOMEN, AND PELVIS WITH CONTRAST TECHNIQUE: Multidetector CT imaging of the chest, abdomen and pelvis was performed following the standard protocol during bolus administration of intravenous contrast. CONTRAST:  138m OMNIPAQUE IOHEXOL 300 MG/ML SOLN, 358mOMNIPAQUE IOHEXOL 300 MG/ML SOLN COMPARISON:  None. FINDINGS: CT NECK FINDINGS: Pharynx and larynx: Oral cavity within normal limits without mass lesion or loculated collection. Patient is largely edentulous. Calcified tonsilliths noted within  the right palatine tonsil. Tonsils themselves symmetric and within normal limits bilaterally. Parapharyngeal fat maintained. Nasopharynx normal. No retropharyngeal collection. Epiglottis normal. Vallecula clear. Remainder of the hypopharynx and supraglottic larynx normal. Glottis within normal limits. Subglottic airway clear. Salivary glands: Parotid and submandibular glands within normal limits. Thyroid: Thyroid diffusely enlarged without discrete nodule or mass. Lymph nodes: Mildly prominent level II lymph nodes measure up to 9 mm in short axis bilaterally. Left level III nodes measure up to 9 mm as well. Increased number of shotty subcentimeter nodes within the neck bilaterally, left slightly worse than right. No other pathologically enlarged lymph nodes identified within the neck. Vascular: Normal intravascular enhancement seen throughout the neck. Right IJ approach central venous catheter in place. Soft tissue swelling with stranding and scattered foci of soft tissue emphysema within the right  neck likely related to central line placement. Limited intracranial: Unremarkable Visualized orbits: Partially visualized inferior globes and orbits unremarkable. Mastoids and visualized paranasal sinuses: Mucosal thickening noted within the visualized maxillary and sphenoid sinuses. Visualized paranasal sinuses are otherwise clear. Visualize mastoids and middle ear cavities are clear. Skeleton: No acute osseous abnormality. No discrete lytic or blastic osseous lesions. Mild cervical spondylolysis present at C5-6. Other: None. CT CHEST FINDINGS Cardiovascular: Normal intravascular enhancement seen throughout the intra-abdominal aorta without aneurysm or other acute abnormality. Minimal atheromatous plaque within the aortic arch. Visualized great vessels within normal limits. Heart size normal. Small moderate pericardial effusion, simple fluid density. Mediastinum/Nodes: Enlarged nodes within the upper mediastinum measure up to 15 mm in short axis (series 3, image 11). No appreciable supraclavicular adenopathy. No other pathologically enlarged mediastinal lymph nodes. Mild soft tissue density within the right hilum without frank adenopathy. Mildly enlarged 11 mm left hilar node (series 3, image 28). Enlarged axillary adenopathy seen bilaterally, with the largest node on the left measuring 2.1 cm in short axis (series 3, image 16). Largest node on the right measures 1.7 cm in short axis (series 3, image 8). Scattered soft tissue stranding with emphysema and fluid density overlying the left axilla likely related to recent lymph node biopsy, partially visualized. Esophagus within normal limits. Lungs/Pleura: Tracheobronchial tree intact and patent. Moderate layering bilateral pleural effusions with associated atelectasis. No other airspace consolidation. No pulmonary edema. No pneumothorax. 4 mm subpleural ground-glass nodule present at the anterior right upper lobe (series 4, image 57), indeterminate. No other  worrisome pulmonary nodule or mass. Musculoskeletal: No acute osseous finding. No discrete lytic or blastic osseous lesions. CT ABDOMEN PELVIS FINDINGS Hepatobiliary: Liver demonstrates a normal contrast enhanced appearance. Gallbladder within normal limits. No biliary dilatation. Pancreas: Pancreas within normal limits. Spleen: Spleen enlarged measuring 14.3 cm in craniocaudad dimension. Few subtle hypodensities noted within the spleen, largest of which measures approximately 12 mm (series 3, image 57), indeterminate. Adrenals/Urinary Tract: Adrenal glands are normal. Kidneys equal in size with symmetric enhancement. No nephrolithiasis, hydronephrosis, or focal enhancing renal mass. No hydroureter. Bladder within normal limits. Stomach/Bowel: Stomach within normal limits. No evidence for bowel obstruction. Normal appendix. No acute inflammatory changes seen about the bowels. Vascular/Lymphatic: Normal intravascular enhancement seen throughout the intra-abdominal aorta. Mild aorto bi-iliac atherosclerotic disease. No aneurysm. Mesenteric vessels patent proximally. Enlarged 15 mm aortocaval lymph node (series 3, image 7). Additional multifocal shotty subcentimeter aortocaval and periaortic lymph nodes noted. No other pathologically enlarged intra-abdominal lymph nodes identified. Mildly prominent 12 mm left inguinal lymph nodes noted. No other adenopathy within the pelvis. Reproductive: Uterus within normal limits.  Ovaries normal. Other: Moderate volume free  fluid within the pelvis, measuring simple fluid density. No free intraperitoneal air. Musculoskeletal: Scattered anasarca noted within the external soft tissues. No acute osseous finding. No discrete lytic or blastic osseous lesions. IMPRESSION: CT NECK IMPRESSION 1. Increased number of shoddy subcentimeter lymph nodes throughout the neck bilaterally, left slightly worse than right. No pathologically enlarged lymph nodes identified. 2. Mild diffuse thyromegaly  without discrete thyroid nodule or mass. 3. Mild soft tissue stranding with emphysema within the right lateral neck related to recent right-sided Port-A-Cath placement. CT CHEST IMPRESSION 1. Enlarged bilateral axillary and upper mediastinal adenopathy as above, likely related to provided history of lymphoma. Additional borderline enlarged left hilar node may be related to lymphoma or possibly reactive in nature. Attention at follow-up recommended. 2. Moderate layering bilateral pleural effusions with associated atelectasis. 3. Small to moderate pericardial effusion. 4. 4 mm right upper lobe ground-glass nodule, indeterminate. Attention at follow-up recommended. CT ABDOMEN AND PELVIS IMPRESSION 1. Enlarged 15 mm aortocaval lymph node with mildly enlarged 12 mm left inguinal lymph nodes. Additional increased number of shotty subcentimeter retroperitoneal lymph nodes. Findings most likely related to history of lymphoma. 2. Splenomegaly. 3. Moderate volume free fluid within the pelvis, of uncertain etiology, but could be physiologic and/or related overall volume status. 4. Mild diffuse anasarca. Electronically Signed   By: Jeannine Boga M.D.   On: 11/18/2018 19:56   Ct Chest W Contrast  Result Date: 11/18/2018 CLINICAL DATA:  Initial evaluation for Hodgkin's lymphoma, initial workup. EXAM: CT NECK WITH CONTRAST CT CHEST, ABDOMEN, AND PELVIS WITH CONTRAST TECHNIQUE: Multidetector CT imaging of the chest, abdomen and pelvis was performed following the standard protocol during bolus administration of intravenous contrast. CONTRAST:  148m OMNIPAQUE IOHEXOL 300 MG/ML SOLN, 375mOMNIPAQUE IOHEXOL 300 MG/ML SOLN COMPARISON:  None. FINDINGS: CT NECK FINDINGS: Pharynx and larynx: Oral cavity within normal limits without mass lesion or loculated collection. Patient is largely edentulous. Calcified tonsilliths noted within the right palatine tonsil. Tonsils themselves symmetric and within normal limits bilaterally.  Parapharyngeal fat maintained. Nasopharynx normal. No retropharyngeal collection. Epiglottis normal. Vallecula clear. Remainder of the hypopharynx and supraglottic larynx normal. Glottis within normal limits. Subglottic airway clear. Salivary glands: Parotid and submandibular glands within normal limits. Thyroid: Thyroid diffusely enlarged without discrete nodule or mass. Lymph nodes: Mildly prominent level II lymph nodes measure up to 9 mm in short axis bilaterally. Left level III nodes measure up to 9 mm as well. Increased number of shotty subcentimeter nodes within the neck bilaterally, left slightly worse than right. No other pathologically enlarged lymph nodes identified within the neck. Vascular: Normal intravascular enhancement seen throughout the neck. Right IJ approach central venous catheter in place. Soft tissue swelling with stranding and scattered foci of soft tissue emphysema within the right neck likely related to central line placement. Limited intracranial: Unremarkable Visualized orbits: Partially visualized inferior globes and orbits unremarkable. Mastoids and visualized paranasal sinuses: Mucosal thickening noted within the visualized maxillary and sphenoid sinuses. Visualized paranasal sinuses are otherwise clear. Visualize mastoids and middle ear cavities are clear. Skeleton: No acute osseous abnormality. No discrete lytic or blastic osseous lesions. Mild cervical spondylolysis present at C5-6. Other: None. CT CHEST FINDINGS Cardiovascular: Normal intravascular enhancement seen throughout the intra-abdominal aorta without aneurysm or other acute abnormality. Minimal atheromatous plaque within the aortic arch. Visualized great vessels within normal limits. Heart size normal. Small moderate pericardial effusion, simple fluid density. Mediastinum/Nodes: Enlarged nodes within the upper mediastinum measure up to 15 mm in short axis (series 3, image  11). No appreciable supraclavicular adenopathy. No  other pathologically enlarged mediastinal lymph nodes. Mild soft tissue density within the right hilum without frank adenopathy. Mildly enlarged 11 mm left hilar node (series 3, image 28). Enlarged axillary adenopathy seen bilaterally, with the largest node on the left measuring 2.1 cm in short axis (series 3, image 16). Largest node on the right measures 1.7 cm in short axis (series 3, image 8). Scattered soft tissue stranding with emphysema and fluid density overlying the left axilla likely related to recent lymph node biopsy, partially visualized. Esophagus within normal limits. Lungs/Pleura: Tracheobronchial tree intact and patent. Moderate layering bilateral pleural effusions with associated atelectasis. No other airspace consolidation. No pulmonary edema. No pneumothorax. 4 mm subpleural ground-glass nodule present at the anterior right upper lobe (series 4, image 57), indeterminate. No other worrisome pulmonary nodule or mass. Musculoskeletal: No acute osseous finding. No discrete lytic or blastic osseous lesions. CT ABDOMEN PELVIS FINDINGS Hepatobiliary: Liver demonstrates a normal contrast enhanced appearance. Gallbladder within normal limits. No biliary dilatation. Pancreas: Pancreas within normal limits. Spleen: Spleen enlarged measuring 14.3 cm in craniocaudad dimension. Few subtle hypodensities noted within the spleen, largest of which measures approximately 12 mm (series 3, image 57), indeterminate. Adrenals/Urinary Tract: Adrenal glands are normal. Kidneys equal in size with symmetric enhancement. No nephrolithiasis, hydronephrosis, or focal enhancing renal mass. No hydroureter. Bladder within normal limits. Stomach/Bowel: Stomach within normal limits. No evidence for bowel obstruction. Normal appendix. No acute inflammatory changes seen about the bowels. Vascular/Lymphatic: Normal intravascular enhancement seen throughout the intra-abdominal aorta. Mild aorto bi-iliac atherosclerotic disease. No  aneurysm. Mesenteric vessels patent proximally. Enlarged 15 mm aortocaval lymph node (series 3, image 7). Additional multifocal shotty subcentimeter aortocaval and periaortic lymph nodes noted. No other pathologically enlarged intra-abdominal lymph nodes identified. Mildly prominent 12 mm left inguinal lymph nodes noted. No other adenopathy within the pelvis. Reproductive: Uterus within normal limits.  Ovaries normal. Other: Moderate volume free fluid within the pelvis, measuring simple fluid density. No free intraperitoneal air. Musculoskeletal: Scattered anasarca noted within the external soft tissues. No acute osseous finding. No discrete lytic or blastic osseous lesions. IMPRESSION: CT NECK IMPRESSION 1. Increased number of shoddy subcentimeter lymph nodes throughout the neck bilaterally, left slightly worse than right. No pathologically enlarged lymph nodes identified. 2. Mild diffuse thyromegaly without discrete thyroid nodule or mass. 3. Mild soft tissue stranding with emphysema within the right lateral neck related to recent right-sided Port-A-Cath placement. CT CHEST IMPRESSION 1. Enlarged bilateral axillary and upper mediastinal adenopathy as above, likely related to provided history of lymphoma. Additional borderline enlarged left hilar node may be related to lymphoma or possibly reactive in nature. Attention at follow-up recommended. 2. Moderate layering bilateral pleural effusions with associated atelectasis. 3. Small to moderate pericardial effusion. 4. 4 mm right upper lobe ground-glass nodule, indeterminate. Attention at follow-up recommended. CT ABDOMEN AND PELVIS IMPRESSION 1. Enlarged 15 mm aortocaval lymph node with mildly enlarged 12 mm left inguinal lymph nodes. Additional increased number of shotty subcentimeter retroperitoneal lymph nodes. Findings most likely related to history of lymphoma. 2. Splenomegaly. 3. Moderate volume free fluid within the pelvis, of uncertain etiology, but could be  physiologic and/or related overall volume status. 4. Mild diffuse anasarca. Electronically Signed   By: Jeannine Boga M.D.   On: 11/18/2018 19:56   US Pelvis (transabdominal Only)  Result Date: 12/05/2018 CLINICAL DATA:  Vaginal bleeding. EXAM: TRANSABDOMINAL ULTRASOUND OF PELVIS TECHNIQUE: Transabdominal ultrasound examination of the pelvis was performed including evaluation of the uterus,  ovaries, adnexal regions, and pelvic cul-de-sac. COMPARISON:  CT, 11/27/2018 FINDINGS: Uterus Measurements: 9.4 x 3.9 x 6.2 cm = volume: 117 mL. No fibroids or other mass visualized. Endometrium Thickness: 13 mm.  No focal abnormality visualized. Right ovary Measurements: 4.5 x 2.6 x 3.4 cm = volume: 29 mL. Prominent, but otherwise unremarkable. Normal vascular flow noted on Doppler analysis. No adnexal masses. Left ovary Not definitively seen.  No left adnexal masses. Other findings:  No abnormal free fluid. IMPRESSION: 1. No acute findings. 2. No endometrial masses or thickening. 3. Left ovary not visualized.  No adnexal masses. Electronically Signed   By: Lajean Manes M.D.   On: 12/05/2018 19:12   Ct Abdomen Pelvis W Contrast  Result Date: 11/27/2018 CLINICAL DATA:  Inpatient. Non-Hodgkin lymphoma. Ongoing chemotherapy. Abdominal pain. Rising bilirubin. EXAM: CT ABDOMEN AND PELVIS WITH CONTRAST TECHNIQUE: Multidetector CT imaging of the abdomen and pelvis was performed using the standard protocol following bolus administration of intravenous contrast. CONTRAST:  142m OMNIPAQUE IOHEXOL 300 MG/ML  SOLN COMPARISON:  11/18/2018 CT chest, abdomen and pelvis. FINDINGS: Lower chest: Small dependent bilateral pleural effusions with passive dependent bibasilar atelectasis, unchanged. Small pericardial effusion appears stable. Hepatobiliary: Normal liver size. No liver mass. Normal gallbladder with no radiopaque cholelithiasis. No biliary ductal dilatation. Pancreas: Normal, with no mass or duct dilation. Spleen: Spleen  has decreased and is now normal in size. Craniocaudal splenic length 11.4 cm, previously 13.3 cm. No splenic mass. Adrenals/Urinary Tract: Normal adrenals. Mild bilateral hydroureteronephrosis to the level of the ureterovesical junction. Symmetric mildly delayed contrast nephrograms bilaterally. New vague small low-attenuation 1.6 cm renal cortical focus in the lateral upper left kidney (series 7/image 12). Otherwise no renal lesions. Markedly distended and otherwise normal bladder. Stomach/Bowel: Normal non-distended stomach. Normal caliber small bowel with no small bowel wall thickening. Normal appendix. Normal large bowel with no diverticulosis, large bowel wall thickening or pericolonic fat stranding. Vascular/Lymphatic: Atherosclerotic nonaneurysmal abdominal aorta. Patent portal, splenic, hepatic and renal veins. Left inguinal lymphadenopathy is decreased, for example previously 1.2 cm, now 0.9 cm (series 2/image 84). Retroperitoneal adenopathy in aortocaval and left para-aortic chains has decreased. Representative 0.8 cm aortocaval node (series 2/image 31), previously 1.2 cm. Representative 0.8 cm left para-aortic node (series 2/image 30), previously 1.2 cm. No pathologically enlarged abdominopelvic nodes. Reproductive: Grossly normal uterus.  No adnexal mass. Other: Trace free fluid in pelvic cul-de-sac. No focal fluid collection. No pneumoperitoneum. Mild anasarca, similar. Musculoskeletal: No aggressive appearing focal osseous lesions. Mild lumbar spondylosis. IMPRESSION: 1. Acute urinary retention with massively distended urinary bladder and mild bilateral hydroureteronephrosis. Recommend urgent bladder catheterization. 2. Evidence of treatment response. Spleen is decreased and now normal in size. Retroperitoneal and left inguinal lymphadenopathy is decreased, with no residual pathologically enlarged abdominopelvic nodes. 3. Small dependent bilateral pleural effusions with passive dependent bibasilar  atelectasis, stable. 4. Stable small pericardial effusion. 5. Aortic Atherosclerosis (ICD10-I70.0). These results were called by telephone at the time of interpretation on 11/27/2018 at 11:07 am to RBoydton who verbally acknowledged these results. Per the RN, a bladder catheterization was just completed with 2075 cc of urine drained. Electronically Signed   By: JIlona SorrelM.D.   On: 11/27/2018 11:31   Ct Abdomen Pelvis W Contrast  Result Date: 11/18/2018 CLINICAL DATA:  Initial evaluation for Hodgkin's lymphoma, initial workup. EXAM: CT NECK WITH CONTRAST CT CHEST, ABDOMEN, AND PELVIS WITH CONTRAST TECHNIQUE: Multidetector CT imaging of the chest, abdomen and pelvis was performed following the standard protocol during bolus  administration of intravenous contrast. CONTRAST:  1108m OMNIPAQUE IOHEXOL 300 MG/ML SOLN, 323mOMNIPAQUE IOHEXOL 300 MG/ML SOLN COMPARISON:  None. FINDINGS: CT NECK FINDINGS: Pharynx and larynx: Oral cavity within normal limits without mass lesion or loculated collection. Patient is largely edentulous. Calcified tonsilliths noted within the right palatine tonsil. Tonsils themselves symmetric and within normal limits bilaterally. Parapharyngeal fat maintained. Nasopharynx normal. No retropharyngeal collection. Epiglottis normal. Vallecula clear. Remainder of the hypopharynx and supraglottic larynx normal. Glottis within normal limits. Subglottic airway clear. Salivary glands: Parotid and submandibular glands within normal limits. Thyroid: Thyroid diffusely enlarged without discrete nodule or mass. Lymph nodes: Mildly prominent level II lymph nodes measure up to 9 mm in short axis bilaterally. Left level III nodes measure up to 9 mm as well. Increased number of shotty subcentimeter nodes within the neck bilaterally, left slightly worse than right. No other pathologically enlarged lymph nodes identified within the neck. Vascular: Normal intravascular enhancement seen throughout the  neck. Right IJ approach central venous catheter in place. Soft tissue swelling with stranding and scattered foci of soft tissue emphysema within the right neck likely related to central line placement. Limited intracranial: Unremarkable Visualized orbits: Partially visualized inferior globes and orbits unremarkable. Mastoids and visualized paranasal sinuses: Mucosal thickening noted within the visualized maxillary and sphenoid sinuses. Visualized paranasal sinuses are otherwise clear. Visualize mastoids and middle ear cavities are clear. Skeleton: No acute osseous abnormality. No discrete lytic or blastic osseous lesions. Mild cervical spondylolysis present at C5-6. Other: None. CT CHEST FINDINGS Cardiovascular: Normal intravascular enhancement seen throughout the intra-abdominal aorta without aneurysm or other acute abnormality. Minimal atheromatous plaque within the aortic arch. Visualized great vessels within normal limits. Heart size normal. Small moderate pericardial effusion, simple fluid density. Mediastinum/Nodes: Enlarged nodes within the upper mediastinum measure up to 15 mm in short axis (series 3, image 11). No appreciable supraclavicular adenopathy. No other pathologically enlarged mediastinal lymph nodes. Mild soft tissue density within the right hilum without frank adenopathy. Mildly enlarged 11 mm left hilar node (series 3, image 28). Enlarged axillary adenopathy seen bilaterally, with the largest node on the left measuring 2.1 cm in short axis (series 3, image 16). Largest node on the right measures 1.7 cm in short axis (series 3, image 8). Scattered soft tissue stranding with emphysema and fluid density overlying the left axilla likely related to recent lymph node biopsy, partially visualized. Esophagus within normal limits. Lungs/Pleura: Tracheobronchial tree intact and patent. Moderate layering bilateral pleural effusions with associated atelectasis. No other airspace consolidation. No pulmonary  edema. No pneumothorax. 4 mm subpleural ground-glass nodule present at the anterior right upper lobe (series 4, image 57), indeterminate. No other worrisome pulmonary nodule or mass. Musculoskeletal: No acute osseous finding. No discrete lytic or blastic osseous lesions. CT ABDOMEN PELVIS FINDINGS Hepatobiliary: Liver demonstrates a normal contrast enhanced appearance. Gallbladder within normal limits. No biliary dilatation. Pancreas: Pancreas within normal limits. Spleen: Spleen enlarged measuring 14.3 cm in craniocaudad dimension. Few subtle hypodensities noted within the spleen, largest of which measures approximately 12 mm (series 3, image 57), indeterminate. Adrenals/Urinary Tract: Adrenal glands are normal. Kidneys equal in size with symmetric enhancement. No nephrolithiasis, hydronephrosis, or focal enhancing renal mass. No hydroureter. Bladder within normal limits. Stomach/Bowel: Stomach within normal limits. No evidence for bowel obstruction. Normal appendix. No acute inflammatory changes seen about the bowels. Vascular/Lymphatic: Normal intravascular enhancement seen throughout the intra-abdominal aorta. Mild aorto bi-iliac atherosclerotic disease. No aneurysm. Mesenteric vessels patent proximally. Enlarged 15 mm aortocaval lymph node (series 3,  image 7). Additional multifocal shotty subcentimeter aortocaval and periaortic lymph nodes noted. No other pathologically enlarged intra-abdominal lymph nodes identified. Mildly prominent 12 mm left inguinal lymph nodes noted. No other adenopathy within the pelvis. Reproductive: Uterus within normal limits.  Ovaries normal. Other: Moderate volume free fluid within the pelvis, measuring simple fluid density. No free intraperitoneal air. Musculoskeletal: Scattered anasarca noted within the external soft tissues. No acute osseous finding. No discrete lytic or blastic osseous lesions. IMPRESSION: CT NECK IMPRESSION 1. Increased number of shoddy subcentimeter lymph  nodes throughout the neck bilaterally, left slightly worse than right. No pathologically enlarged lymph nodes identified. 2. Mild diffuse thyromegaly without discrete thyroid nodule or mass. 3. Mild soft tissue stranding with emphysema within the right lateral neck related to recent right-sided Port-A-Cath placement. CT CHEST IMPRESSION 1. Enlarged bilateral axillary and upper mediastinal adenopathy as above, likely related to provided history of lymphoma. Additional borderline enlarged left hilar node may be related to lymphoma or possibly reactive in nature. Attention at follow-up recommended. 2. Moderate layering bilateral pleural effusions with associated atelectasis. 3. Small to moderate pericardial effusion. 4. 4 mm right upper lobe ground-glass nodule, indeterminate. Attention at follow-up recommended. CT ABDOMEN AND PELVIS IMPRESSION 1. Enlarged 15 mm aortocaval lymph node with mildly enlarged 12 mm left inguinal lymph nodes. Additional increased number of shotty subcentimeter retroperitoneal lymph nodes. Findings most likely related to history of lymphoma. 2. Splenomegaly. 3. Moderate volume free fluid within the pelvis, of uncertain etiology, but could be physiologic and/or related overall volume status. 4. Mild diffuse anasarca. Electronically Signed   By: Jeannine Boga M.D.   On: 11/18/2018 19:56   Ct Biopsy  Result Date: 11/15/2018 INDICATION: Pancytopenia, concern for lymphoproliferative process EXAM: CT GUIDED RIGHT ILIAC BONE MARROW ASPIRATION AND CORE BIOPSY Date:  11/15/2018 11/15/2018 10:13 am Radiologist:  M. Daryll Brod, MD Guidance:  CT FLUOROSCOPY TIME:  Fluoroscopy Time: None. MEDICATIONS: 1% lidocaine local ANESTHESIA/SEDATION: 2.0 mg IV Versed; 50 mcg IV Fentanyl Moderate Sedation Time:  10 minutes The patient was continuously monitored during the procedure by the interventional radiology nurse under my direct supervision. CONTRAST:  None. COMPLICATIONS: None PROCEDURE: Informed  consent was obtained from the patient following explanation of the procedure, risks, benefits and alternatives. The patient understands, agrees and consents for the procedure. All questions were addressed. A time out was performed. The patient was positioned prone and non-contrast localization CT was performed of the pelvis to demonstrate the iliac marrow spaces. Maximal barrier sterile technique utilized including caps, mask, sterile gowns, sterile gloves, large sterile drape, hand hygiene, and Betadine prep. Under sterile conditions and local anesthesia, an 11 gauge coaxial bone biopsy needle was advanced into the right iliac marrow space. Needle position was confirmed with CT imaging. Initially, bone marrow aspiration was performed. Next, the 11 gauge outer cannula was utilized to obtain a right iliac bone marrow core biopsy. Needle was removed. Hemostasis was obtained with compression. The patient tolerated the procedure well. Samples were prepared with the cytotechnologist. No immediate complications. IMPRESSION: CT guided right iliac bone marrow aspiration and core biopsy. Electronically Signed   By: Jerilynn Mages.  Shick M.D.   On: 11/15/2018 11:25   Dg Chest Port 1 View  Result Date: 12/06/2018 CLINICAL DATA:  53 year old female with fever, not responding to questions EXAM: PORTABLE CHEST 1 VIEW COMPARISON:  Prior chest x-ray 11/26/2018 FINDINGS: Right IJ approach single-lumen power injectable port catheter. The catheter tip overlies the mid SVC. The lungs are clear and negative for focal  airspace consolidation, pulmonary edema or suspicious pulmonary nodule. No pleural effusion or pneumothorax. Cardiac and mediastinal contours are within normal limits. No acute fracture or lytic or blastic osseous lesions. The visualized upper abdominal bowel gas pattern is unremarkable. IMPRESSION: No active disease. Electronically Signed   By: Jacqulynn Cadet M.D.   On: 12/06/2018 13:42   Dg Chest Port 1 View  Result Date:  11/25/2018 CLINICAL DATA:  Dyspnea. EXAM: PORTABLE CHEST 1 VIEW COMPARISON:  November 21, 2018 FINDINGS: The right Port-A-Cath is stable. Small effusion and left basilar opacity remain. Mild pulmonary venous congestion without overt edema. The cardiomediastinal silhouette is stable with cardiomegaly. IMPRESSION: Mild pulmonary venous congestion. Tiny left effusion with underlying opacity in left base, stable. No other change. Electronically Signed   By: Dorise Bullion III M.D   On: 11/25/2018 13:02   Ct Bone Marrow Biopsy & Aspiration  Result Date: 11/15/2018 INDICATION: Pancytopenia, concern for lymphoproliferative process EXAM: CT GUIDED RIGHT ILIAC BONE MARROW ASPIRATION AND CORE BIOPSY Date:  11/15/2018 11/15/2018 10:13 am Radiologist:  M. Daryll Brod, MD Guidance:  CT FLUOROSCOPY TIME:  Fluoroscopy Time: None. MEDICATIONS: 1% lidocaine local ANESTHESIA/SEDATION: 2.0 mg IV Versed; 50 mcg IV Fentanyl Moderate Sedation Time:  10 minutes The patient was continuously monitored during the procedure by the interventional radiology nurse under my direct supervision. CONTRAST:  None. COMPLICATIONS: None PROCEDURE: Informed consent was obtained from the patient following explanation of the procedure, risks, benefits and alternatives. The patient understands, agrees and consents for the procedure. All questions were addressed. A time out was performed. The patient was positioned prone and non-contrast localization CT was performed of the pelvis to demonstrate the iliac marrow spaces. Maximal barrier sterile technique utilized including caps, mask, sterile gowns, sterile gloves, large sterile drape, hand hygiene, and Betadine prep. Under sterile conditions and local anesthesia, an 11 gauge coaxial bone biopsy needle was advanced into the right iliac marrow space. Needle position was confirmed with CT imaging. Initially, bone marrow aspiration was performed. Next, the 11 gauge outer cannula was utilized to obtain a right  iliac bone marrow core biopsy. Needle was removed. Hemostasis was obtained with compression. The patient tolerated the procedure well. Samples were prepared with the cytotechnologist. No immediate complications. IMPRESSION: CT guided right iliac bone marrow aspiration and core biopsy. Electronically Signed   By: Jerilynn Mages.  Shick M.D.   On: 11/15/2018 11:25   Ir Imaging Guided Port Insertion  Result Date: 11/18/2018 INDICATION: 53 year old female with history lymphoma EXAM: IMPLANTED PORT A CATH PLACEMENT WITH ULTRASOUND AND FLUOROSCOPIC GUIDANCE MEDICATIONS: 2.0 g Ancef; The antibiotic was administered within an appropriate time interval prior to skin puncture. ANESTHESIA/SEDATION: Moderate (conscious) sedation was employed during this procedure. A total of Versed 2.0 mg and Fentanyl 100 mcg was administered intravenously. Moderate Sedation Time: 18 minutes. The patient's level of consciousness and vital signs were monitored continuously by radiology nursing throughout the procedure under my direct supervision. FLUOROSCOPY TIME:  0 minutes, 12 seconds (1.0 mGy) COMPLICATIONS: None PROCEDURE: The procedure, risks, benefits, and alternatives were explained to the patient. Questions regarding the procedure were encouraged and answered. The patient understands and consents to the procedure. Ultrasound survey was performed with images stored and sent to PACs. The right neck and chest was prepped with chlorhexidine, and draped in the usual sterile fashion using maximum barrier technique (cap and mask, sterile gown, sterile gloves, large sterile sheet, hand hygiene and cutaneous antiseptic). Antibiotic prophylaxis was provided with 2.0g Ancef administered IV one hour prior to  skin incision. Local anesthesia was attained by infiltration with 1% lidocaine without epinephrine. Ultrasound demonstrated patency of the right internal jugular vein, and this was documented with an image. Under real-time ultrasound guidance, this vein  was accessed with a 21 gauge micropuncture needle and image documentation was performed. A small dermatotomy was made at the access site with an 11 scalpel. A 0.018" wire was advanced into the SVC and used to estimate the length of the internal catheter. The access needle exchanged for a 5F micropuncture vascular sheath. The 0.018" wire was then removed and a 0.035" wire advanced into the IVC. An appropriate location for the subcutaneous reservoir was selected below the clavicle and an incision was made through the skin and underlying soft tissues. The subcutaneous tissues were then dissected using a combination of blunt and sharp surgical technique and a pocket was formed. A single lumen power injectable portacatheter was then tunneled through the subcutaneous tissues from the pocket to the dermatotomy and the port reservoir placed within the subcutaneous pocket. The venous access site was then serially dilated and a peel away vascular sheath placed over the wire. The wire was removed and the port catheter advanced into position under fluoroscopic guidance. The catheter tip is positioned in the cavoatrial junction. This was documented with a spot image. The portacatheter was then tested and found to flush and aspirate well. The port was flushed with saline followed by 100 units/mL heparinized saline. The pocket was then closed in two layers using first subdermal inverted interrupted absorbable sutures followed by a running subcuticular suture. The epidermis was then sealed with Dermabond. The dermatotomy at the venous access site was also seal with Dermabond. Patient tolerated the procedure well and remained hemodynamically stable throughout. No complications encountered and no significant blood loss encountered IMPRESSION: Status post right IJ port catheter placement. Catheter ready for use. Signed, Dulcy Fanny. Dellia Nims, RPVI [facility] Radiology Electronically  Signed   By: Corrie Mckusick D.O.   On: 11/18/2018 11:23   US Abdomen Limited Ruq  Result Date: 11/29/2018 CLINICAL DATA:  Elevated total bilirubin = 5.2 EXAM: ULTRASOUND ABDOMEN LIMITED RIGHT UPPER QUADRANT COMPARISON:  CT abdomen and pelvis 11/27/2018 FINDINGS: Gallbladder: Normally distended without stones or wall thickening. No pericholecystic fluid or sonographic Murphy sign. Common bile duct: Diameter: 3 mm diameter, normal Liver: Normal hepatic echogenicity. No mass or nodularity. No intrahepatic biliary dilatation. Portal vein is patent on color Doppler imaging with normal direction of blood flow towards the liver. No RIGHT upper quadrant free fluid. IMPRESSION: Normal exam. Electronically Signed   By: Lavonia Dana M.D.   On: 11/29/2018 14:34    (Echo, Carotid, EGD, Colonoscopy, ERCP)    Subjective:   Discharge Exam: Vitals:   12/11/18 1341 12/12/18 0512  BP: 135/72 123/80  Pulse: 80 90  Resp: 14 16  Temp: 98.1 F (36.7 C) 97.8 F (36.6 C)  SpO2: 100% 100%   Vitals:   12/11/18 0500 12/11/18 0525 12/11/18 1341 12/12/18 0512  BP:  117/83 135/72 123/80  Pulse:  92 80 90  Resp:  '16 14 16  ' Temp:  97.7 F (36.5 C) 98.1 F (36.7 C) 97.8 F (36.6 C)  TempSrc:  Oral Oral Oral  SpO2:  100% 100% 100%  Weight: 57.6 kg   57.4 kg  Height:        General: Pt is alert, awake, not in acute distress, thin in appearance Cardiovascular: RRR, S1/S2 +, no rubs, no gallops Respiratory:  CTA bilaterally, no wheezing, no rhonchi Abdominal: Soft, NT, ND, bowel sounds + Extremities: no edema, no cyanosis Integumentary: Maculopapular particular rash noted to bilateral lower extremities and bilateral upper extremities, unchanged    The results of significant diagnostics from this hospitalization (including imaging, microbiology, ancillary and laboratory) are listed below for reference.     Microbiology: Recent Results (from the past 240 hour(s))  Culture, blood (routine x 2)     Status:  None   Collection Time: 12/06/18  1:00 PM  Result Value Ref Range Status   Specimen Description   Final    BLOOD RIGHT ARM Performed at [facility], [address], [place], [place] [address]    Special Requests   Final    BOTTLES DRAWN AEROBIC ONLY Blood Culture results may not be optimal due to an inadequate volume of blood received in culture bottles Performed at [facility] [address] [place], [place] [address]    Culture   Final    NO GROWTH 5 DAYS Performed at [facility], [place] [address], [place], [place] [address]    Report Status 12/11/2018 FINAL  Final  Culture, blood (routine x 2)     Status: None   Collection Time: 12/06/18  1:00 PM  Result Value Ref Range Status   Specimen Description   Final    BLOOD LEFT ARM Performed at [facility], [place] [address], [place], [place] [address]    Special Requests   Final    BOTTLES DRAWN AEROBIC ONLY Blood Culture results may not be optimal due to an inadequate volume of blood received in culture bottles Performed at [facility] [address] [place], [place] [address]    Culture   Final    NO GROWTH 5 DAYS Performed at [facility], [place] [address], [place], [place] [address]    Report Status 12/11/2018 FINAL  Final     Labs: BNP (last 3 results) Recent Labs    11/21/18 1519  BNP 1,761.6*   Basic Metabolic Panel: Recent Labs  Lab 12/07/18 0309 12/08/18 0413 12/09/18 0428 12/10/18 0405 12/11/18 0443 12/12/18 0441  NA 138 137 139 141 139 139  K 3.0* 3.1* 3.3* 2.9* 4.1 3.7  CL 97* 100 105 111 108 107  CO2 32 '28 27 24 25 25  ' GLUCOSE 110* 95 94 157* 98 97  BUN '10 9 7 ' 5* <5* <5*  CREATININE 0.64 0.58 0.62 0.39* 0.55 0.55  CALCIUM 9.0 8.1* 8.0* 7.0* 7.9* 8.0*  MG 1.8 1.7 2.4  --  2.5* 2.3  PHOS 2.9  --   --   --  1.8*  --    Liver Function Tests: Recent Labs  Lab 12/06/18 0440 12/07/18 0309 12/09/18 0428 12/11/18 0443 12/12/18 0441   AST 59* 51* 58* 59* 52*  ALT 57* 50* 43 43 41  ALKPHOS 613* 421* 386* 335* 309*  BILITOT 2.7* 2.1* 1.8* 1.6* 1.5*  PROT 5.2* 5.0* 5.3* 5.4* 5.6*  ALBUMIN 2.0* 1.9* 2.1* 2.2* 2.3*   No results for input(s): LIPASE, AMYLASE in the last 168 hours. Recent Labs  Lab 12/05/18 1355  AMMONIA 10   CBC: Recent Labs  Lab 12/08/18 0413 12/09/18 0428 12/09/18 1756 12/10/18 0840 12/11/18 0443 12/12/18 0441  WBC 4.5 5.3  --  6.5 10.2 13.5*  HGB 7.7* 7.9*  --  7.8* 8.0* 8.2*  HCT 25.5* 26.0*  --  25.4* 27.2* 27.3*  MCV  94.1 94.2  --  94.8 96.8 96.5  PLT 236 269 285 321 368 427*   Cardiac Enzymes: No results for input(s): CKTOTAL, CKMB, CKMBINDEX, TROPONINI in the last 168 hours. BNP: Invalid input(s): POCBNP CBG: No results for input(s): GLUCAP in the last 168 hours. D-Dimer Recent Labs    12/09/18 1756  DDIMER 2.34*   Hgb A1c No results for input(s): HGBA1C in the last 72 hours. Lipid Profile No results for input(s): CHOL, HDL, LDLCALC, TRIG, CHOLHDL, LDLDIRECT in the last 72 hours. Thyroid function studies No results for input(s): TSH, T4TOTAL, T3FREE, THYROIDAB in the last 72 hours.  Invalid input(s): FREET3 Anemia work up No results for input(s): VITAMINB12, FOLATE, FERRITIN, TIBC, IRON, RETICCTPCT in the last 72 hours. Urinalysis    Component Value Date/Time   COLORURINE YELLOW 12/06/2018 1306   APPEARANCEUR CLEAR 12/06/2018 1306   LABSPEC 1.011 12/06/2018 1306   PHURINE 5.0 12/06/2018 1306   GLUCOSEU NEGATIVE 12/06/2018 1306   HGBUR SMALL (A) 12/06/2018 1306   BILIRUBINUR NEGATIVE 12/06/2018 1306   KETONESUR 5 (A) 12/06/2018 1306   PROTEINUR NEGATIVE 12/06/2018 1306   NITRITE NEGATIVE 12/06/2018 1306   LEUKOCYTESUR NEGATIVE 12/06/2018 1306   Sepsis Labs Invalid input(s): PROCALCITONIN,  WBC,  LACTICIDVEN Microbiology Recent Results (from the past 240 hour(s))  Culture, blood (routine x 2)     Status: None   Collection Time: 12/06/18  1:00 PM  Result  Value Ref Range Status   Specimen Description   Final    BLOOD RIGHT ARM Performed at [facility], [place] [address], [place], [place] [address]    Special Requests   Final    BOTTLES DRAWN AEROBIC ONLY Blood Culture results may not be optimal due to an inadequate volume of blood received in culture bottles Performed at [facility] [address], [place], [place] [address]    Culture   Final    NO GROWTH 5 DAYS Performed at [facility], [place] [address] [place], [place] [address]    Report Status 12/11/2018 FINAL  Final  Culture, blood (routine x 2)     Status: None   Collection Time: 12/06/18  1:00 PM  Result Value Ref Range Status   Specimen Description   Final    BLOOD LEFT ARM Performed at [facility], [place] [address], [place], [place] [address]    Special Requests   Final    BOTTLES DRAWN AEROBIC ONLY Blood Culture results may not be optimal due to an inadequate volume of blood received in culture bottles Performed at [facility] [address] [place], [place] [address]    Culture   Final    NO GROWTH 5 DAYS Performed at [facility], [place] [address], [place], [place] [address]    Report Status 12/11/2018 FINAL  Final     Time coordinating discharge: Over 30 minutes  SIGNED:    J [place] DO  [facility] 12/12/2018, 11:04 AM

## 2018-12-20 ENCOUNTER — Ambulatory Visit: Payer: Self-pay | Admitting: Hematology

## 2018-12-20 ENCOUNTER — Other Ambulatory Visit: Payer: Self-pay

## 2018-12-25 DIAGNOSIS — C8190 Hodgkin lymphoma, unspecified, unspecified site: Secondary | ICD-10-CM

## 2019-01-01 ENCOUNTER — Other Ambulatory Visit: Payer: Self-pay

## 2019-01-01 ENCOUNTER — Inpatient Hospital Stay (HOSPITAL_COMMUNITY)
Admission: EM | Admit: 2019-01-01 | Discharge: 2019-01-08 | DRG: 193 | Disposition: A | Discharge status: Hospice | Payer: Medicaid Other | Source: Other Acute Inpatient Hospital | Attending: Internal Medicine | Admitting: Internal Medicine

## 2019-01-01 ENCOUNTER — Encounter (HOSPITAL_COMMUNITY): Payer: Self-pay

## 2019-01-01 DIAGNOSIS — J189 Pneumonia, unspecified organism: Secondary | ICD-10-CM | POA: Diagnosis present

## 2019-01-01 DIAGNOSIS — D7589 Other specified diseases of blood and blood-forming organs: Secondary | ICD-10-CM | POA: Diagnosis present

## 2019-01-01 DIAGNOSIS — R627 Adult failure to thrive: Secondary | ICD-10-CM | POA: Diagnosis not present

## 2019-01-01 DIAGNOSIS — T451X5A Adverse effect of antineoplastic and immunosuppressive drugs, initial encounter: Secondary | ICD-10-CM | POA: Diagnosis present

## 2019-01-01 DIAGNOSIS — G8929 Other chronic pain: Secondary | ICD-10-CM | POA: Diagnosis not present

## 2019-01-01 DIAGNOSIS — Z1623 Resistance to quinolones and fluoroquinolones: Secondary | ICD-10-CM | POA: Diagnosis not present

## 2019-01-01 DIAGNOSIS — E43 Unspecified severe protein-calorie malnutrition: Secondary | ICD-10-CM | POA: Diagnosis present

## 2019-01-01 DIAGNOSIS — E039 Hypothyroidism, unspecified: Secondary | ICD-10-CM | POA: Diagnosis not present

## 2019-01-01 DIAGNOSIS — C8190 Hodgkin lymphoma, unspecified, unspecified site: Secondary | ICD-10-CM | POA: Diagnosis not present

## 2019-01-01 DIAGNOSIS — Z9221 Personal history of antineoplastic chemotherapy: Secondary | ICD-10-CM

## 2019-01-01 DIAGNOSIS — E860 Dehydration: Secondary | ICD-10-CM | POA: Diagnosis present

## 2019-01-01 DIAGNOSIS — Z20828 Contact with and (suspected) exposure to other viral communicable diseases: Secondary | ICD-10-CM | POA: Diagnosis present

## 2019-01-01 DIAGNOSIS — D6481 Anemia due to antineoplastic chemotherapy: Secondary | ICD-10-CM | POA: Diagnosis not present

## 2019-01-01 DIAGNOSIS — D6959 Other secondary thrombocytopenia: Secondary | ICD-10-CM | POA: Diagnosis not present

## 2019-01-01 DIAGNOSIS — Z515 Encounter for palliative care: Secondary | ICD-10-CM | POA: Diagnosis not present

## 2019-01-01 DIAGNOSIS — Z79890 Hormone replacement therapy (postmenopausal): Secondary | ICD-10-CM

## 2019-01-01 DIAGNOSIS — R531 Weakness: Secondary | ICD-10-CM | POA: Diagnosis present

## 2019-01-01 DIAGNOSIS — R339 Retention of urine, unspecified: Secondary | ICD-10-CM | POA: Diagnosis not present

## 2019-01-01 DIAGNOSIS — Z79899 Other long term (current) drug therapy: Secondary | ICD-10-CM | POA: Diagnosis not present

## 2019-01-01 DIAGNOSIS — R0600 Dyspnea, unspecified: Secondary | ICD-10-CM

## 2019-01-01 DIAGNOSIS — Z599 Problem related to housing and economic circumstances, unspecified: Secondary | ICD-10-CM | POA: Diagnosis not present

## 2019-01-01 DIAGNOSIS — R21 Rash and other nonspecific skin eruption: Secondary | ICD-10-CM | POA: Diagnosis present

## 2019-01-01 DIAGNOSIS — D61818 Other pancytopenia: Secondary | ICD-10-CM | POA: Diagnosis not present

## 2019-01-01 DIAGNOSIS — A419 Sepsis, unspecified organism: Secondary | ICD-10-CM

## 2019-01-01 DIAGNOSIS — Z79891 Long term (current) use of opiate analgesic: Secondary | ICD-10-CM | POA: Diagnosis not present

## 2019-01-01 DIAGNOSIS — R000 Tachycardia, unspecified: Secondary | ICD-10-CM | POA: Diagnosis not present

## 2019-01-01 DIAGNOSIS — J159 Unspecified bacterial pneumonia: Principal | ICD-10-CM | POA: Diagnosis present

## 2019-01-01 DIAGNOSIS — R1110 Vomiting, unspecified: Secondary | ICD-10-CM

## 2019-01-01 DIAGNOSIS — Z66 Do not resuscitate: Secondary | ICD-10-CM | POA: Diagnosis not present

## 2019-01-01 DIAGNOSIS — Z681 Body mass index (BMI) 19 or less, adult: Secondary | ICD-10-CM

## 2019-01-01 DIAGNOSIS — J181 Lobar pneumonia, unspecified organism: Secondary | ICD-10-CM

## 2019-01-01 DIAGNOSIS — Y95 Nosocomial condition: Secondary | ICD-10-CM | POA: Diagnosis not present

## 2019-01-01 DIAGNOSIS — J9601 Acute respiratory failure with hypoxia: Secondary | ICD-10-CM | POA: Diagnosis not present

## 2019-01-01 DIAGNOSIS — Z8719 Personal history of other diseases of the digestive system: Secondary | ICD-10-CM

## 2019-01-01 HISTORY — DX: Pneumonia, unspecified organism: J18.9

## 2019-01-01 HISTORY — DX: Malignant (primary) neoplasm, unspecified: C80.1

## 2019-01-01 LAB — COMPREHENSIVE METABOLIC PANEL
ALT: 45 U/L — ABNORMAL HIGH (ref 0–44)
AST: 99 U/L — ABNORMAL HIGH (ref 15–41)
Albumin: 1.4 g/dL — ABNORMAL LOW (ref 3.5–5.0)
Alkaline Phosphatase: 285 U/L — ABNORMAL HIGH (ref 38–126)
Anion gap: 9 (ref 5–15)
BUN: 13 mg/dL (ref 6–20)
CO2: 23 mmol/L (ref 22–32)
Calcium: 7.3 mg/dL — ABNORMAL LOW (ref 8.9–10.3)
Chloride: 98 mmol/L (ref 98–111)
Creatinine, Ser: 0.61 mg/dL (ref 0.44–1.00)
GFR calc Af Amer: >60 mL/min (ref >60)
GFR calc non Af Amer: >60 mL/min (ref >60)
Glucose, Bld: 85 mg/dL (ref 70–99)
Potassium: 3.4 mmol/L — ABNORMAL LOW (ref 3.5–5.1)
Sodium: 130 mmol/L — ABNORMAL LOW (ref 135–145)
Total Bilirubin: 1.4 mg/dL — ABNORMAL HIGH (ref 0.3–1.2)
Total Protein: 5.6 g/dL — ABNORMAL LOW (ref 6.5–8.1)

## 2019-01-01 LAB — RESPIRATORY PANEL BY PCR

## 2019-01-01 LAB — CBC
HCT: 21.4 % — ABNORMAL LOW (ref 36.0–46.0)
Hemoglobin: 7.2 g/dL — ABNORMAL LOW (ref 12.0–15.0)
MCH: 30.4 pg (ref 26.0–34.0)
MCHC: 33.6 g/dL (ref 30.0–36.0)
MCV: 90.3 fL (ref 80.0–100.0)
Platelets: 77 10*3/uL — ABNORMAL LOW (ref 150–400)
RBC: 2.37 MIL/uL — ABNORMAL LOW (ref 3.87–5.11)
RDW: 19.1 % — ABNORMAL HIGH (ref 11.5–15.5)
WBC: 5.2 10*3/uL (ref 4.0–10.5)
nRBC: 0 % (ref 0.0–0.2)

## 2019-01-01 LAB — MRSA PCR SCREENING: MRSA by PCR: NEGATIVE

## 2019-01-01 MED ORDER — MAGNESIUM OXIDE 400 (241.3 MG) MG PO TABS
400.0000 mg | ORAL_TABLET | Freq: Two times a day (BID) | ORAL | Status: DC
  Administered 2019-01-01 – 2019-01-08 (×15): 400 mg via ORAL
  Filled 2019-01-01 (×15): qty 1

## 2019-01-01 MED ORDER — ADULT MULTIVITAMIN W/MINERALS CH
1.0000 | ORAL_TABLET | Freq: Every day | ORAL | Status: DC
  Administered 2019-01-01 – 2019-01-08 (×8): 1 via ORAL
  Filled 2019-01-01 (×8): qty 1

## 2019-01-01 MED ORDER — LEVOTHYROXINE SODIUM 75 MCG PO TABS
75.0000 ug | ORAL_TABLET | Freq: Every day | ORAL | Status: DC
  Administered 2019-01-01 – 2019-01-05 (×5): 75 ug via ORAL
  Filled 2019-01-01 (×5): qty 1

## 2019-01-01 MED ORDER — ENOXAPARIN SODIUM 40 MG/0.4ML ~~LOC~~ SOLN
40.0000 mg | SUBCUTANEOUS | Status: DC
  Administered 2019-01-01: 40 mg via SUBCUTANEOUS
  Filled 2019-01-01 (×2): qty 0.4

## 2019-01-01 MED ORDER — ONDANSETRON HCL 4 MG/2ML IJ SOLN
4.0000 mg | Freq: Four times a day (QID) | INTRAMUSCULAR | Status: DC | PRN
  Filled 2019-01-01: qty 2

## 2019-01-01 MED ORDER — HYDROCOD POLST-CPM POLST ER 10-8 MG/5ML PO SUER
5.0000 mL | Freq: Two times a day (BID) | ORAL | Status: DC
  Administered 2019-01-01 – 2019-01-06 (×10): 5 mL via ORAL
  Filled 2019-01-01 (×11): qty 5

## 2019-01-01 MED ORDER — SODIUM CHLORIDE 0.9% FLUSH
10.0000 mL | INTRAVENOUS | Status: DC | PRN
  Administered 2019-01-08: 12:00:00 10 mL
  Filled 2019-01-01: qty 40

## 2019-01-01 MED ORDER — OXYCODONE HCL 5 MG PO TABS: 5.0000 mg | ORAL_TABLET | ORAL | Status: DC | PRN

## 2019-01-01 MED ORDER — SODIUM CHLORIDE 0.9 % IV SOLN
INTRAVENOUS | Status: DC
  Administered 2019-01-01: 04:00:00 via INTRAVENOUS

## 2019-01-01 MED ORDER — ACETAMINOPHEN 325 MG PO TABS
650.0000 mg | ORAL_TABLET | Freq: Four times a day (QID) | ORAL | Status: DC | PRN
  Administered 2019-01-01 – 2019-01-04 (×5): 650 mg via ORAL
  Filled 2019-01-01 (×5): qty 2

## 2019-01-01 MED ORDER — SODIUM CHLORIDE 0.9 % IV SOLN
500.0000 mg | INTRAVENOUS | Status: DC
  Administered 2019-01-01: 500 mg via INTRAVENOUS
  Filled 2019-01-01 (×3): qty 500

## 2019-01-01 MED ORDER — SODIUM CHLORIDE 0.9 % IV SOLN
1.0000 g | INTRAVENOUS | Status: DC
  Administered 2019-01-01: 1 g via INTRAVENOUS
  Filled 2019-01-01 (×3): qty 10

## 2019-01-01 NOTE — [place] Notes (Signed)
[facility] PLAN OF CARE NOTE Patient: Cindy Ward SHN:[id number]   PCP: [name], MD DOB: 08-20-1966   DOA: 01/01/2019   DOS: 01/01/2019    Patient was admitted by my colleague Dr. Marlowe Sax earlier on 01/01/2019. I have reviewed the H&P as well as assessment and plan and agree with the same. Important changes in the plan are listed below.  Plan of care: Principal Problem:   CAP (community acquired pneumonia) Active Problems:   Sepsis ([place])   Acute respiratory failure with hypoxia ([place])   Emesis   Anemia associated with chemotherapy continue Antibiotics. Low threshold to broden spectrum.   Pancytopenia Monitor Will get further labs tomorrow  Hypoxia Improved. Monitor.   Author: Berle Mull, MD [facility] 01/01/2019 9:42 AM   If 7PM-7AM, please contact night-coverage at [email or web address]

## 2019-01-01 NOTE — H&P (Signed)
History and Physical    Cindy Ward KGU:[id number] DOB: 1966/08/21 DOA: 01/01/2019  PCP: [name], MD Patient coming from: [name] ED  Chief Complaint: Generalized weakness  HPI: Cindy Ward is a 53 y.o. female with medical history significant of hypertension, stage IV Hodgkin's lymphoma with first cycle of chemotherapy in late February and admitted last month with E. coli and group B strep bacteremia for which she completed a course of IV Rocephin now presenting to [facility] complaining of cough and malaise.  In the ED, found to have left lower lobe pneumonia and a new 2 L supplemental oxygen requirement.  Transferred to [facility] for COVID-19 rule out.  Patient states she has been feeling tired since her last chemotherapy treatment.  She went to the cancer center today for routine checkup and was told she has a fever.  Does report having a slight cough.  Denies having any fevers or chills at home.  Denies having any shortness of breath.  Reports one episode of bilious emesis yesterday.  Denies any abdominal pain or diarrhea.  Denies any recent travel, sick contacts, or exposure to any individual with confirmed COVID-19.  ED Course:  Per [name] documentation, patient presented to the cancer center for routine physical checkup.  Found to have a temperature of 100.3 F and tachycardic with heart rate 124 bpm.  Blood pressure 80/47.  New 2 L supplemental oxygen requirement.  Labs: White count 6.4, hemoglobin 7.5 (received a unit of RBCs at the cancer center), platelet count 118, lactic acid 2.>  1.4 LDH 639, CRP 221, procalcitonin 1.99 CK less than 20, troponin negative Lipase normal.  UA not suggestive of infection.  Chest x-ray showing no edema or consolidation.  Chest CT without contrast showing patchy and tree-in-bud nodularity in the left lower lobe, compatible with infection.  Medications administered in the ED: Tylenol 975 mg, azithromycin 500 mg, ceftriaxone 1 g,  Zofran 4 mg, normal saline 1 L bolus  Review of Systems: As per HPI otherwise 10 point review of systems negative.  Past Medical History:  Diagnosis Date  . Cancer ([place])   . Hypertension   . Pneumonia   . Thyroid disease     Past Surgical History:  Procedure Laterality Date  . AXILLARY LYMPH NODE BIOPSY Left 11/12/2018   Procedure: AXILLARY LYMPH NODE BIOPSY LEFT;  Surgeon: Coralie Keens, MD;  Location: [facility] ORS;  Service: General;  Laterality: Left;  . IR IMAGING GUIDED PORT INSERTION  11/18/2018     reports that she has never smoked. She has never used smokeless tobacco. She reports that she does not drink alcohol or use drugs.  No Known Allergies  No family history on file.  Prior to Admission medications   Medication Sig Start Date End Date Taking? Authorizing Provider  dexamethasone (DECADRON) 4 MG tablet Take 2 tablets by mouth once a day on the day after chemotherapy and then take 2 tablets two times a day for 2 days. Take with food. 11/18/18  Yes Tish Men, MD  levothyroxine (SYNTHROID, LEVOTHROID) 75 MCG tablet Take 1 tablet by mouth daily. 11/23/17  Yes [provider]  magnesium oxide (MAG-OX) 400 (241.3 Mg) MG tablet Take 1 tablet (400 mg total) by mouth 2 (two) times daily. 12/12/18  Yes [place] Donnamarie Poag, DO  Multiple Vitamin (MULTIVITAMIN WITH MINERALS) TABS tablet Take 1 tablet by mouth daily.   Yes [provider]  ondansetron (ZOFRAN) 8 MG tablet Take 1 tablet (8 mg total) by mouth  2 (two) times daily as needed. Start on the third day after chemotherapy. 11/18/18  Yes Tish Men, MD  oxyCODONE (OXY IR/ROXICODONE) 5 MG immediate release tablet Take 1 tablet (5 mg total) by mouth every 4 (four) hours as needed for moderate pain. 12/12/18  Yes [place] Eric J, DO  potassium chloride (KLOR-CON) 20 MEQ packet Take 40 mEq by mouth 3 (three) times daily. 12/12/18  Yes [place] Donnamarie Poag, DO  lidocaine-prilocaine (EMLA) cream Apply to affected area once 11/18/18    Tish Men, MD  LORazepam (ATIVAN) 0.5 MG tablet Take 1 tablet (0.5 mg total) by mouth every 6 (six) hours as needed (Nausea or vomiting). 11/18/18   Tish Men, MD  nebivolol (BYSTOLIC) 10 MG tablet Take 1 tablet by mouth daily. 10/13/16   [provider]  ondansetron (ZOFRAN-ODT) 8 MG disintegrating tablet Take 1 tablet by mouth as needed for nausea/vomiting. PRN TID 02/27/18   [provider]  PROAIR HFA 108 (90 Base) MCG/ACT inhaler Take 2 puffs by mouth 4 (four) times daily as needed. 02/27/18   [provider]  prochlorperazine (COMPAZINE) 10 MG tablet Take 1 tablet (10 mg total) by mouth every 6 (six) hours as needed (Nausea or vomiting). 11/18/18   Tish Men, MD    Physical Exam: Vitals:   01/01/19 0046  BP: 138/75  Pulse: (!) 101  Resp: 20  Temp: 98.7 F (37.1 C)  TempSrc: Oral  SpO2: 100%    Physical Exam  Constitutional: She is oriented to person, place, and time. No distress.  HENT:  Head: Normocephalic.  Eyes: Right eye exhibits no discharge. Left eye exhibits no discharge.  Neck: Neck supple.  Cardiovascular: Regular rhythm and intact distal pulses.  Slightly tachycardic  Pulmonary/Chest: Effort normal. No respiratory distress. She has no wheezes.  On 2 L supplemental oxygen  Abdominal: Soft. Bowel sounds are normal. She exhibits no distension. There is no abdominal tenderness. There is no rebound and no guarding.  Musculoskeletal:        General: No edema.  Neurological: She is alert and oriented to person, place, and time.  Skin: Skin is warm and dry. She is not diaphoretic.     Labs on Admission: I have personally reviewed following labs and imaging studies  CBC: No results for input(s): WBC, NEUTROABS, HGB, HCT, MCV, PLT in the last 168 hours. Basic Metabolic Panel: No results for input(s): NA, K, CL, CO2, GLUCOSE, BUN, CREATININE, CALCIUM, MG, PHOS in the last 168 hours. GFR: CrCl cannot be calculated (Unknown ideal weight.). Liver  Function Tests: No results for input(s): AST, ALT, ALKPHOS, BILITOT, PROT, ALBUMIN in the last 168 hours. No results for input(s): LIPASE, AMYLASE in the last 168 hours. No results for input(s): AMMONIA in the last 168 hours. Coagulation Profile: No results for input(s): INR, PROTIME in the last 168 hours. Cardiac Enzymes: No results for input(s): CKTOTAL, CKMB, CKMBINDEX, TROPONINI in the last 168 hours. BNP (last 3 results) No results for input(s): PROBNP in the last 8760 hours. HbA1C: No results for input(s): HGBA1C in the last 72 hours. CBG: No results for input(s): GLUCAP in the last 168 hours. Lipid Profile: No results for input(s): CHOL, HDL, LDLCALC, TRIG, CHOLHDL, LDLDIRECT in the last 72 hours. Thyroid Function Tests: No results for input(s): TSH, T4TOTAL, FREET4, T3FREE, THYROIDAB in the last 72 hours. Anemia Panel: No results for input(s): VITAMINB12, FOLATE, FERRITIN, TIBC, IRON, RETICCTPCT in the last 72 hours. Urine analysis:    Component Value Date/Time  COLORURINE YELLOW 12/06/2018 [place] 12/06/2018 1306   LABSPEC 1.011 12/06/2018 1306   PHURINE 5.0 12/06/2018 1306   GLUCOSEU NEGATIVE 12/06/2018 1306   HGBUR SMALL (A) 12/06/2018 1306   BILIRUBINUR NEGATIVE 12/06/2018 1306   KETONESUR 5 (A) 12/06/2018 1306   PROTEINUR NEGATIVE 12/06/2018 1306   NITRITE NEGATIVE 12/06/2018 1306   LEUKOCYTESUR NEGATIVE 12/06/2018 1306    Radiological Exams on Admission: No results found.  Assessment/Plan Principal Problem:   CAP (community acquired pneumonia) Active Problems:   Sepsis ([place])   Acute respiratory failure with hypoxia ([facility])   Emesis   Anemia associated with chemotherapy   Sepsis secondary to community-acquired pneumonia; suspicion for COVID-19 -Patient is presenting with fever and cough.  Temperature 100.3 F at her cancer center.  Tachycardic and hypotensive.   -No leukocytosis.  Lactic acid 2.1 >1.4 with 1 L IV fluid at outside  hospital.  Currently normotensive and heart rate improved. -Labs at outside hospital showing LDH 639, CRP 221, procalcitonin 1.99. -Chest CT at outside hospital showing  patchy and tree-in-bud nodularity in the left lower lobe, compatible with infection. -Received a dose of ceftriaxone and azithromycin in the ED.  Will continue antibiotics. -Testing for SARS-CoV-2 -Respiratory viral panel -Droplet and contact precautions -Blood culture x2  Acute hypoxic respiratory failure secondary to community-acquired pneumonia -New 2 L supplemental oxygen requirement. -Continue management of pneumonia as mentioned above.  Emesis Patient reports one episode of bilious emesis yesterday.  Denies abdominal pain or diarrhea.  Lipase checked at outside hospital normal.  Abdominal exam benign. -Check LFTs  -Antiemetic -IV fluid hydration  Anemia and thrombocytopenia Likely related to bone marrow suppression from chemotherapy.  Hemoglobin 7.5 (received a unit of PRBCs at the cancer center) and platelet count 118. -Repeat CBC and continue to monitor  Stage IV Hodgkin's lymphoma -Recently diagnosed in February 2020 and received first cycle of chemotherapy. -Patient will need oncology follow-up  Hypothyroidism -Continue Synthroid  Chronic pain -Continue home oxycodone 5 mg every 4 hours as needed  DVT prophylaxis: Lovenox Code Status: Full code.  Discussed with the patient. Family Communication: No family available. Disposition Plan: Anticipate discharge after clinical improvement. Consults called: None Admission status: Observation, progressive care unit  This chart was dictated using voice recognition software.  Despite best efforts to proofread, errors can occur which can change the documentation meaning.  Shela Leff MD [facility] Pager [phone]  If 7PM-7AM, please contact night-coverage [email or web address] Password [email or web address]  01/01/2019, 3:03 AM

## 2019-01-01 NOTE — TOC Initial Note (Addendum)
Transition of Care [facility]) - Initial/Assessment Note    Patient Details  Name: Cindy Ward MRN: [id number] Date of Birth: [birth year]  Transition of Care [facility]) CM/SW Contact:    Zenon Mayo, RN Phone Number: 01/01/2019, 9:49 AM  Clinical Narrative:                 From home , her daughter lives with her, she has a walker and a bsc at home, she has PCP Dr. Helene Kelp with [facility] in [place], she has applied for medicaid 2 months ago, Preferred pharmacy [facility] in [place], she has money to afford her medications per patient. Per patient , her daughter will be her transportation at discharge.  She is active with [facility] for HHPT,will need resumption orders at discharge.  Expected Discharge Plan: Home/Self Care Barriers to Discharge: Inadequate or no insurance   Patient Goals and CMS Choice Patient states their goals for this hospitalization and ongoing recovery are:: get better and go home   Choice offered to / list presented to : NA  Expected Discharge Plan and Services Expected Discharge Plan: Home/Self Care   Discharge Planning Services: CM Consult Post Acute Care Choice: NA Living arrangements for the past 2 months: Single Family Home                 DME Arranged: N/A DME Agency: NA HH Arranged: NA HH Agency: NA  Prior Living Arrangements/Services Living arrangements for the past 2 months: Single Family Home Lives with:: Adult Children   Do you feel safe going back to the place where you live?: Yes               Activities of Daily Living Home Assistive Devices/Equipment: Contact lenses, Walker (specify type) ADL Screening (condition at time of admission) Patient's cognitive ability adequate to safely complete daily activities?: Yes Is the patient deaf or have difficulty hearing?: No Does the patient have difficulty seeing, even when wearing glasses/contacts?: Yes Does the patient have difficulty concentrating, remembering, or making  decisions?: No Patient able to express need for assistance with ADLs?: Yes Does the patient have difficulty dressing or bathing?: No Independently performs ADLs?: Yes (appropriate for developmental age) Does the patient have difficulty walking or climbing stairs?: Yes Weakness of Legs: Both Weakness of Arms/Hands: Both  Permission Sought/Granted                  Emotional Assessment   Attitude/Demeanor/Rapport: Engaged Affect (typically observed): Appropriate, Accepting Orientation: : Oriented to Self, Oriented to Place, Oriented to  Time, Oriented to Situation   Psych Involvement: No (comment)  Admission diagnosis:  PNEUMONIA FEVER LOW RISK COVID R O Patient Active Problem List   Diagnosis Date Noted  . CAP (community acquired pneumonia) 01/01/2019  . Sepsis ([place]) 01/01/2019  . Acute respiratory failure with hypoxia ([place]) 01/01/2019  . Emesis 01/01/2019  . Anemia associated with chemotherapy 01/01/2019  . Rash and nonspecific skin eruption   . Total bilirubin, elevated   . Palliative care by specialist   . Goals of care, counseling/discussion   . Oral thrush   . Normocytic anemia   . Hodgkin lymphoma ([place]) 11/18/2018  . Malnutrition of moderate degree 11/16/2018  . Pancytopenia ([place])   . Unintentional weight loss 11/12/2018  . Hashimoto's thyroiditis 11/12/2018  . Goiter 11/12/2018  . Hypertension 11/12/2018  . Aortic atherosclerosis ([place]) 11/12/2018  . Splenomegaly 11/11/2018  . Liver enzyme elevation 11/11/2018  . Lymphadenopathy 05/14/2018   PCP:  [name], MD Pharmacy:   [facility], [place] [address] Phone: [phone] Fax: [phone]     Social Determinants of Health (SDOH) Interventions    Readmission Risk Interventions Readmission Risk Prevention Plan 01/01/2019  Transportation Screening Complete  Home Care Screening Not Complete  Home Care Screening Not Completed Comments NA

## 2019-01-02 ENCOUNTER — Encounter (HOSPITAL_COMMUNITY): Payer: Self-pay | Admitting: Family

## 2019-01-02 DIAGNOSIS — Z66 Do not resuscitate: Secondary | ICD-10-CM | POA: Diagnosis present

## 2019-01-02 DIAGNOSIS — T451X5A Adverse effect of antineoplastic and immunosuppressive drugs, initial encounter: Secondary | ICD-10-CM | POA: Diagnosis present

## 2019-01-02 DIAGNOSIS — Z599 Problem related to housing and economic circumstances, unspecified: Secondary | ICD-10-CM | POA: Diagnosis not present

## 2019-01-02 DIAGNOSIS — Z79890 Hormone replacement therapy (postmenopausal): Secondary | ICD-10-CM | POA: Diagnosis not present

## 2019-01-02 DIAGNOSIS — D6481 Anemia due to antineoplastic chemotherapy: Secondary | ICD-10-CM | POA: Diagnosis present

## 2019-01-02 DIAGNOSIS — J159 Unspecified bacterial pneumonia: Secondary | ICD-10-CM | POA: Diagnosis present

## 2019-01-02 DIAGNOSIS — Z1623 Resistance to quinolones and fluoroquinolones: Secondary | ICD-10-CM | POA: Diagnosis present

## 2019-01-02 DIAGNOSIS — J189 Pneumonia, unspecified organism: Secondary | ICD-10-CM

## 2019-01-02 DIAGNOSIS — D61818 Other pancytopenia: Secondary | ICD-10-CM | POA: Diagnosis present

## 2019-01-02 DIAGNOSIS — Z79891 Long term (current) use of opiate analgesic: Secondary | ICD-10-CM | POA: Diagnosis not present

## 2019-01-02 DIAGNOSIS — Y95 Nosocomial condition: Secondary | ICD-10-CM | POA: Diagnosis present

## 2019-01-02 DIAGNOSIS — R531 Weakness: Secondary | ICD-10-CM | POA: Diagnosis present

## 2019-01-02 DIAGNOSIS — Z681 Body mass index (BMI) 19 or less, adult: Secondary | ICD-10-CM | POA: Diagnosis not present

## 2019-01-02 DIAGNOSIS — D6959 Other secondary thrombocytopenia: Secondary | ICD-10-CM | POA: Diagnosis present

## 2019-01-02 DIAGNOSIS — Z79899 Other long term (current) drug therapy: Secondary | ICD-10-CM | POA: Diagnosis not present

## 2019-01-02 DIAGNOSIS — E43 Unspecified severe protein-calorie malnutrition: Secondary | ICD-10-CM | POA: Diagnosis present

## 2019-01-02 DIAGNOSIS — G8929 Other chronic pain: Secondary | ICD-10-CM | POA: Diagnosis present

## 2019-01-02 DIAGNOSIS — C8190 Hodgkin lymphoma, unspecified, unspecified site: Secondary | ICD-10-CM | POA: Diagnosis present

## 2019-01-02 DIAGNOSIS — J9601 Acute respiratory failure with hypoxia: Secondary | ICD-10-CM | POA: Diagnosis present

## 2019-01-02 DIAGNOSIS — E860 Dehydration: Secondary | ICD-10-CM | POA: Diagnosis present

## 2019-01-02 DIAGNOSIS — D7589 Other specified diseases of blood and blood-forming organs: Secondary | ICD-10-CM | POA: Diagnosis present

## 2019-01-02 DIAGNOSIS — Z515 Encounter for palliative care: Secondary | ICD-10-CM | POA: Diagnosis present

## 2019-01-02 DIAGNOSIS — Z20828 Contact with and (suspected) exposure to other viral communicable diseases: Secondary | ICD-10-CM | POA: Diagnosis present

## 2019-01-02 DIAGNOSIS — R000 Tachycardia, unspecified: Secondary | ICD-10-CM | POA: Diagnosis present

## 2019-01-02 DIAGNOSIS — R627 Adult failure to thrive: Secondary | ICD-10-CM | POA: Diagnosis present

## 2019-01-02 DIAGNOSIS — E039 Hypothyroidism, unspecified: Secondary | ICD-10-CM | POA: Diagnosis present

## 2019-01-02 DIAGNOSIS — R21 Rash and other nonspecific skin eruption: Secondary | ICD-10-CM | POA: Diagnosis present

## 2019-01-02 LAB — CBC WITH DIFFERENTIAL/PLATELET
Abs Immature Granulocytes: 0.04 10*3/uL (ref 0.00–0.07)
Basophils Absolute: 0 10*3/uL (ref 0.0–0.1)
Basophils Relative: 1 %
Eosinophils Absolute: 0 10*3/uL (ref 0.0–0.5)
Eosinophils Relative: 0 %
HCT: 21.7 % — ABNORMAL LOW (ref 36.0–46.0)
Hemoglobin: 7.2 g/dL — ABNORMAL LOW (ref 12.0–15.0)
Immature Granulocytes: 1 %
Lymphocytes Relative: 11 %
Lymphs Abs: 0.5 10*3/uL — ABNORMAL LOW (ref 0.7–4.0)
MCH: 30.3 pg (ref 26.0–34.0)
MCHC: 33.2 g/dL (ref 30.0–36.0)
MCV: 91.2 fL (ref 80.0–100.0)
Monocytes Absolute: 0.3 10*3/uL (ref 0.1–1.0)
Monocytes Relative: 7 %
Neutro Abs: 3.5 10*3/uL (ref 1.7–7.7)
Neutrophils Relative %: 80 %
Platelets: UNDETERMINED 10*3/uL (ref 150–400)
RBC: 2.38 MIL/uL — ABNORMAL LOW (ref 3.87–5.11)
RDW: 18.9 % — ABNORMAL HIGH (ref 11.5–15.5)
WBC: 4.3 10*3/uL (ref 4.0–10.5)
nRBC: 0 % (ref 0.0–0.2)

## 2019-01-02 LAB — LACTATE DEHYDROGENASE: LDH: 183 U/L (ref 98–192)

## 2019-01-02 LAB — COMPREHENSIVE METABOLIC PANEL
ALT: 40 U/L (ref 0–44)
AST: 98 U/L — ABNORMAL HIGH (ref 15–41)
Albumin: 1.4 g/dL — ABNORMAL LOW (ref 3.5–5.0)
Alkaline Phosphatase: 450 U/L — ABNORMAL HIGH (ref 38–126)
Anion gap: 8 (ref 5–15)
BUN: 13 mg/dL (ref 6–20)
CO2: 27 mmol/L (ref 22–32)
Calcium: 7.5 mg/dL — ABNORMAL LOW (ref 8.9–10.3)
Chloride: 98 mmol/L (ref 98–111)
Creatinine, Ser: 0.68 mg/dL (ref 0.44–1.00)
GFR calc Af Amer: >60 mL/min (ref >60)
GFR calc non Af Amer: >60 mL/min (ref >60)
Glucose, Bld: 97 mg/dL (ref 70–99)
Potassium: 3.9 mmol/L (ref 3.5–5.1)
Sodium: 133 mmol/L — ABNORMAL LOW (ref 135–145)
Total Bilirubin: 1.3 mg/dL — ABNORMAL HIGH (ref 0.3–1.2)
Total Protein: 5.3 g/dL — ABNORMAL LOW (ref 6.5–8.1)

## 2019-01-02 LAB — MAGNESIUM: Magnesium: 1.7 mg/dL (ref 1.7–2.4)

## 2019-01-02 LAB — PROCALCITONIN: Procalcitonin: 2.12 ng/mL

## 2019-01-02 LAB — FERRITIN: Ferritin: >7500 ng/mL — ABNORMAL HIGH (ref 11–307)

## 2019-01-02 LAB — ABO/RH: ABO/RH(D): AB NEG

## 2019-01-02 LAB — C-REACTIVE PROTEIN: CRP: 21.7 mg/dL — ABNORMAL HIGH (ref <1.0)

## 2019-01-02 MED ORDER — LEVOFLOXACIN IN D5W 750 MG/150ML IV SOLN
750.0000 mg | INTRAVENOUS | Status: DC
  Administered 2019-01-02 – 2019-01-03 (×2): 750 mg via INTRAVENOUS
  Filled 2019-01-02 (×3): qty 150

## 2019-01-02 MED ORDER — FAMOTIDINE 20 MG PO TABS
20.0000 mg | ORAL_TABLET | Freq: Two times a day (BID) | ORAL | Status: DC
  Administered 2019-01-02 – 2019-01-05 (×8): 20 mg via ORAL
  Filled 2019-01-02 (×8): qty 1

## 2019-01-02 MED ORDER — DIPHENHYDRAMINE HCL 25 MG PO CAPS
25.0000 mg | ORAL_CAPSULE | Freq: Two times a day (BID) | ORAL | Status: DC
  Administered 2019-01-02 – 2019-01-05 (×8): 25 mg via ORAL
  Filled 2019-01-02 (×8): qty 1

## 2019-01-02 MED ORDER — DOXYCYCLINE HYCLATE 100 MG PO TABS: 100.0000 mg | ORAL_TABLET | Freq: Two times a day (BID) | ORAL | Status: DC

## 2019-01-02 NOTE — [place] Notes (Signed)
Errythmatic rash to bilateral upper extremities and face; warm to touch. Pt denies itching, exudate. Pt states this has happened the past two times she has gotten "high dose antibiotics" and denies complaints.   Loel Dubonnet, RN

## 2019-01-02 NOTE — Progress Notes (Signed)
[facility] Progress Note  Patient: Cindy Ward DJT:[id number]   PCP: [name], MD DOB: [birth year]   DOA: 01/01/2019   DOS: 01/02/2019   Date of Service: the patient was seen and examined on 01/02/2019  Brief hospital course: Pt. with PMH of HTN, stage IV Hodgkin's lymphoma S/P 1 cycle of chemotherapy and 11/14/2018, recent admission for E. coli and group B strep bacteremia; admitted on 01/01/2019, presented with complaint of generalized weakness and fever and cough, was found to have healthcare associated pneumonia. Currently further plan is continue IV antibiotics.  Subjective: Feeling better although continues to have shortness of breath as well as cough.  No further fever since admission.  No nausea no vomiting.  No diarrhea no constipation.  Oral intake adequate.  Assessment and Plan: 1.  Healthcare associated pneumonia. Presents with cough and shortness of breath.  Chest x-ray shows no consolidation.  CT chest in outside facility shows patchy tree-in-bud opacities in the left lower lobe. Initially was hypoxic now on room air. No leukocytosis but actually developing pancytopenia. Initially treated with IV ceftriaxone and azithromycin. Currently changed to IV Levaquin given her allergic reaction. Continue to monitor blood cultures.  2.  Rule out COVID. With cough and shortness of breath as well as fever with CT findings patient is currently awaiting the results of the COVID RT-PCR. Oxygenation has remained stable since admission. We will continue with current isolation. Currently no change in therapy recommended for now.  3.  Stage IV Hodgkin's lymphoma. Pancytopenia S/P 1 cycle in 11/14/2018. No further chemotherapy after that due to developing of severe sepsis last admission requiring a prolonged stay on discharge on 12/13/2018. Discussed with patient's primary oncologist Dr. Talitha Givens.  Patient's current pancytopenia is most likely secondary to inadequate treatment for Hodgkin's  lymphoma. Received Granix during last admission for her neutropenia. Recommend supportive measures for now. Patient did not follow-up to oncology after recent discharge. Likely secondary to financial issues as he does not have any insurance. Recommend urgent outpatient follow-up in the clinic once she is either ruled out for COVID or has completed her isolation.  Office will try to contact her as well. Transfuse for hemoglobin less than 7.  4.  Hypothyroidism. Continue Synthroid 75 MCG.  5.  Upper extremity bilateral redness. Patient mentions that she gets this redness every time she gets antibiotics. She mentions that this was not present yesterday and came up this morning when she woke up. This is burning as well as itching. No petechiae confluent plaque-like redness. Had a similar presentation last admission.,  Work-up including complement levels, HIV, ANCA, hepatitis, rheumatoid factor were all negative. We will discontinue IV ceftriaxone and erythromycin and consider that as an allergic reaction for now and transition to Levaquin. Also add Benadryl and Pepcid for now.  6.Recent C. difficile colitis. Patient was treated with IV antibiotics last admission and developed diarrhea and was positive for C. difficile colitis. Treated with oral vancomycin. Currently there is a risk for giving her IV Levaquin but given her allergic reaction no other good option for now.    7.  Severe protein calorie malnutrition. Continue nutrition. Most likely secondary to Hodgkin's lymphoma. Palliative care visit last admission  Diet: Regular diet DVT Prophylaxis: subcutaneous Heparin  Advance goals of care discussion: full code  Family Communication: no family was present at bedside, at the time of interview.   Date: 01/02/2019  4:37 PM  Patient Isolation: Droplet+Contact HCP PPE: Wearing all recommended PPE PAPR + Gown + Gloves +  Surgical Cap Patient PPE: None   Disposition:  Discharge to .   Consultants: none phone discussion with oncology Procedures: none  Scheduled Meds: . chlorpheniramine-HYDROcodone  5 mL Oral Q12H  . diphenhydrAMINE  25 mg Oral BID  . famotidine  20 mg Oral BID  . levothyroxine  75 mcg Oral Q0600  . magnesium oxide  400 mg Oral BID  . multivitamin with minerals  1 tablet Oral Daily   Continuous Infusions: . levofloxacin (LEVAQUIN) IV Stopped (01/02/19 1225)   PRN Meds: acetaminophen, ondansetron (ZOFRAN) IV, oxyCODONE, sodium chloride flush Antibiotics: Anti-infectives (From admission, onward)   Start     Dose/Rate Route Frequency Ordered Stop   01/02/19 1200  levofloxacin (LEVAQUIN) IVPB 750 mg     750 mg 100 mL/hr over 90 Minutes Intravenous Every 24 hours 01/02/19 1008     01/02/19 1000  doxycycline (VIBRA-TABS) tablet 100 mg  Status:  Discontinued     100 mg Oral Every 12 hours 01/02/19 0956 01/02/19 1001   01/01/19 1200  cefTRIAXone (ROCEPHIN) 1 g in sodium chloride 0.9 % 100 mL IVPB  Status:  Discontinued     1 g 200 mL/hr over 30 Minutes Intravenous Every 24 hours 01/01/19 0239 01/02/19 0956   01/01/19 1200  azithromycin (ZITHROMAX) 500 mg in sodium chloride 0.9 % 250 mL IVPB  Status:  Discontinued     500 mg 250 mL/hr over 60 Minutes Intravenous Every 24 hours 01/01/19 0239 01/02/19 0956       Objective: Physical Exam: Vitals:   01/01/19 1400 01/01/19 1702 01/02/19 0026 01/02/19 0800  BP:  106/67 111/65 117/65  Pulse: (!) 107 88 (!) 109 (!) 114  Resp: 18 (!) 23 20   Temp: 99.3 F (37.4 C)  99.6 F (37.6 C)   TempSrc: Axillary  Oral   SpO2: 100% 100% 97% 99%  Weight:      Height:        Intake/Output Summary (Last 24 hours) at 01/02/2019 1610 Last data filed at 01/02/2019 1225 Gross per 24 hour  Intake 386 ml  Output -  Net 386 ml   Filed Weights   01/01/19 0639  Weight: 54.8 kg   General: Alert, Awake and Oriented to Time, Place and Person. Appear in moderate distress, affect appropriate Eyes: PERRL, Conjunctiva  normal ENT: Oral Mucosa clear moist. Neck: no JVD, no Abnormal Mass Or lumps Cardiovascular: S1 and S2 Present, no Murmur, Peripheral Pulses Present Respiratory: normal respiratory effort, Bilateral Air entry equal and Decreased, no use of accessory muscle, Clear to Auscultation, no Crackles, no wheezes Abdomen: Bowel Sound present, Soft and no tenderness, no hernia Skin: bilateral upper extremity redness, no Rash, no induration Extremities: no Pedal edema, no calf tenderness Neurologic: Grossly no focal neuro deficit. Bilaterally Equal motor strength  Data Reviewed: CBC: Recent Labs  Lab 01/01/19 0454 01/02/19 0805  WBC 5.2 4.3  NEUTROABS  --  3.5  HGB 7.2* 7.2*  HCT 21.4* 21.7*  MCV 90.3 91.2  PLT 77* PLATELET CLUMPS NOTED ON SMEAR, UNABLE TO ESTIMATE   Basic Metabolic Panel: Recent Labs  Lab 01/01/19 0454 01/02/19 0805  NA 130* 133*  K 3.4* 3.9  CL 98 98  CO2 23 27  GLUCOSE 85 97  BUN 13 13  CREATININE 0.61 0.68  CALCIUM 7.3* 7.5*  MG  --  1.7    Liver Function Tests: Recent Labs  Lab 01/01/19 0454 01/02/19 0805  AST 99* 98*  ALT 45* 40  ALKPHOS 285* 450*  BILITOT 1.4* 1.3*  PROT 5.6* 5.3*  ALBUMIN 1.4* 1.4*   No results for input(s): LIPASE, AMYLASE in the last 168 hours. No results for input(s): AMMONIA in the last 168 hours. Coagulation Profile: No results for input(s): INR, PROTIME in the last 168 hours. Cardiac Enzymes: No results for input(s): CKTOTAL, CKMB, CKMBINDEX, TROPONINI in the last 168 hours. BNP (last 3 results) No results for input(s): PROBNP in the last 8760 hours. CBG: No results for input(s): GLUCAP in the last 168 hours. Studies: No results found.   Time spent: 35 minutes  Author: Berle Mull, MD [facility] 01/02/2019 4:10 PM  To reach On-call, see care teams to locate the attending and reach out to them via [email or web address]. If 7PM-7AM, please contact night-coverage If you still have difficulty reaching the attending  provider, please page the [facility] (Director on Call) for [facility] on amion for assistance.

## 2019-01-03 LAB — CBC WITH DIFFERENTIAL/PLATELET
Abs Immature Granulocytes: 0.05 10*3/uL (ref 0.00–0.07)
Basophils Absolute: 0 10*3/uL (ref 0.0–0.1)
Basophils Relative: 0 %
Eosinophils Absolute: 0 10*3/uL (ref 0.0–0.5)
Eosinophils Relative: 0 %
HCT: 19.7 % — ABNORMAL LOW (ref 36.0–46.0)
Hemoglobin: 6.4 g/dL — CL (ref 12.0–15.0)
Immature Granulocytes: 1 %
Lymphocytes Relative: 15 %
Lymphs Abs: 0.6 10*3/uL — ABNORMAL LOW (ref 0.7–4.0)
MCH: 29.5 pg (ref 26.0–34.0)
MCHC: 32.5 g/dL (ref 30.0–36.0)
MCV: 90.8 fL (ref 80.0–100.0)
Monocytes Absolute: 0.3 10*3/uL (ref 0.1–1.0)
Monocytes Relative: 8 %
Neutro Abs: 2.9 10*3/uL (ref 1.7–7.7)
Neutrophils Relative %: 76 %
Platelets: 61 10*3/uL — ABNORMAL LOW (ref 150–400)
RBC: 2.17 MIL/uL — ABNORMAL LOW (ref 3.87–5.11)
RDW: 18 % — ABNORMAL HIGH (ref 11.5–15.5)
WBC: 3.9 10*3/uL — ABNORMAL LOW (ref 4.0–10.5)
nRBC: 0 % (ref 0.0–0.2)

## 2019-01-03 LAB — COMPREHENSIVE METABOLIC PANEL
ALT: 48 U/L — ABNORMAL HIGH (ref 0–44)
AST: 152 U/L — ABNORMAL HIGH (ref 15–41)
Albumin: 1.3 g/dL — ABNORMAL LOW (ref 3.5–5.0)
Alkaline Phosphatase: 513 U/L — ABNORMAL HIGH (ref 38–126)
Anion gap: 7 (ref 5–15)
BUN: 11 mg/dL (ref 6–20)
CO2: 26 mmol/L (ref 22–32)
Calcium: 7.6 mg/dL — ABNORMAL LOW (ref 8.9–10.3)
Chloride: 97 mmol/L — ABNORMAL LOW (ref 98–111)
Creatinine, Ser: 0.72 mg/dL (ref 0.44–1.00)
GFR calc Af Amer: >60 mL/min (ref >60)
GFR calc non Af Amer: >60 mL/min (ref >60)
Glucose, Bld: 100 mg/dL — ABNORMAL HIGH (ref 70–99)
Potassium: 3.7 mmol/L (ref 3.5–5.1)
Sodium: 130 mmol/L — ABNORMAL LOW (ref 135–145)
Total Bilirubin: 1.6 mg/dL — ABNORMAL HIGH (ref 0.3–1.2)
Total Protein: 5.3 g/dL — ABNORMAL LOW (ref 6.5–8.1)

## 2019-01-03 LAB — CBC
HCT: 22 % — ABNORMAL LOW (ref 36.0–46.0)
Hemoglobin: 7.4 g/dL — ABNORMAL LOW (ref 12.0–15.0)
MCH: 29.6 pg (ref 26.0–34.0)
MCHC: 33.6 g/dL (ref 30.0–36.0)
MCV: 88 fL (ref 80.0–100.0)
Platelets: 55 10*3/uL — ABNORMAL LOW (ref 150–400)
RBC: 2.5 MIL/uL — ABNORMAL LOW (ref 3.87–5.11)
RDW: 17.8 % — ABNORMAL HIGH (ref 11.5–15.5)
WBC: 3.3 10*3/uL — ABNORMAL LOW (ref 4.0–10.5)
nRBC: 0 % (ref 0.0–0.2)

## 2019-01-03 LAB — PREPARE RBC (CROSSMATCH)

## 2019-01-03 LAB — MAGNESIUM: Magnesium: 1.7 mg/dL (ref 1.7–2.4)

## 2019-01-03 LAB — NOVEL CORONAVIRUS, NAA (HOSP ORDER, SEND-OUT TO REF LAB; TAT 18-24 HRS): SARS-CoV-2, NAA: NOT DETECTED

## 2019-01-03 MED ORDER — FLUCONAZOLE 100 MG PO TABS
200.0000 mg | ORAL_TABLET | Freq: Once | ORAL | Status: AC
  Administered 2019-01-03: 200 mg via ORAL
  Filled 2019-01-03: qty 2

## 2019-01-03 MED ORDER — SODIUM CHLORIDE 0.9% IV SOLUTION: Freq: Once | INTRAVENOUS | Status: DC

## 2019-01-03 NOTE — Progress Notes (Addendum)
Oncology was called regarding the patient's pancytopenia.  Patient was diagnosed with Hodgkin lymphoma during the previous hospitalization after she had undergone extensive testing for generalized lymphadenopathy since 2019 by her local hematologist [name] in [place].  She received 1 dose of inpatient chemotherapy (AVD + Adcetris), and her hospitalization was complicated by severe sepsis, including bacteremia and C. difficile colitis, as well as severe mucositis, hyperbilirubinemia, and deconditioning.  Patient was discharged with oncology follow-up at [facility], but she canceled her appt  Review of the patient' chart showed that she had gone to "cancer center" in [place] for routine follow-up after her recent hospitalization, indicating that she is being followed locally for her Hodgkin lymphoma. She was readmitted for presumed community-acquired pneumonia, and her COVID-19 testing is currently pending.  In light of the patient's Stage IV Hodgkin lymphoma, I suspect that her pancytopenia is most likely secondary to underlying Hodgkin lymphoma, exacerbated by ongoing infection.  She is currently being treated for pneumonia and her testing for COVID-19 is pending, we would not recommend any additional chemotherapy at this time until resolution of the infection.  Due to the risk of possible exposure to COVID-19 and the risk of spread to other immunocompromised patients, oncology has not yet evaluated the patient in person.  From the Hodgkin lymphoma and the pancytopenia standpoint, I recommend supportive care, including PRN transfusions for Hgb less than 7 (please transfuse IRRADIATED blood products only).  Once her COVID-19 test returns (hopefully negative) and her pneumonia improves, she can continue follow-up with her local hematologist [name] for the management of the Hodgkin lymphoma.  Please do not hesitate to contact us if there are any questions.

## 2019-01-03 NOTE — Progress Notes (Addendum)
[facility] Progress Note  Patient: Cindy Ward CZY:[id number]   PCP: [name], MD DOB: 1966/01/11   DOA: 01/01/2019   DOS: 01/03/2019   Date of Service: the patient was seen and examined on 01/03/2019  Brief hospital course: Pt. with PMH of HTN, stage IV Hodgkin's lymphoma S/P 1 cycle of chemotherapy and 11/14/2018, recent admission for E. coli and group B strep bacteremia; admitted on 01/01/2019, presented with complaint of generalized weakness and fever and cough, was found to have healthcare associated pneumonia. Currently further plan is continue IV antibiotics.  Subjective: The rash is better.  No nausea no vomiting no abdominal pain no chills.  Did have fever overnight.  Assessment and Plan: 1.  Healthcare associated pneumonia. Presents with cough and shortness of breath.  Chest x-ray shows no consolidation.  CT chest in outside facility shows patchy tree-in-bud opacities in the left lower lobe. Initially was hypoxic now on room air. No leukocytosis but actually developing pancytopenia. Initially treated with IV ceftriaxone and azithromycin. Currently changed to IV Levaquin given her allergic reaction. Continue to monitor blood cultures.  2.  Rule out COVID. With cough and shortness of breath as well as fever with CT findings patient is currently awaiting the results of the COVID RT-PCR. Still has fevers despite being on IV antibiotics but that could be also secondary to lymphoma Oxygenation has remained stable since admission. We will continue with current isolation. Currently no change in therapy recommended for now.  Addendum: covid test negative, since low pretest probability will d/c precaution and transfer to med tele.  Sutton Hirsch 11:13 PM 01/03/2019    3.  Stage IV Hodgkin's lymphoma. Pancytopenia S/P 1 cycle in 11/14/2018. No further chemotherapy after that due to developing of severe sepsis last admission requiring a prolonged stay on discharge on  12/13/2018. Discussed with patient's primary oncologist Dr. Talitha Givens.  Patient's current pancytopenia is most likely secondary to inadequate treatment for Hodgkin's lymphoma. Received Granix during last admission for her neutropenia. Recommend supportive measures for now. Patient did not follow-up to oncology after recent discharge. Likely secondary to financial issues as he does not have any insurance. Recommend urgent outpatient follow-up in the clinic once she is either ruled out for COVID or has completed her isolation.  Patient should follow-up with her oncologist of choice which would be [name] in [place]. Transfuse for hemoglobin less than 7.  Please use irradiated blood products only.  4.  Hypothyroidism. Continue Synthroid 75 MCG.  5.  Upper extremity bilateral redness. Patient mentions that she gets this redness every time she gets antibiotics. She mentions that this was not present yesterday and came up this morning when she woke up. This is burning as well as itching. No petechiae confluent plaque-like redness. Had a similar presentation last admission.,  Work-up including complement levels, HIV, ANCA, hepatitis, rheumatoid factor were all negative. We will discontinue IV ceftriaxone and erythromycin and consider that as an allergic reaction for now and transition to Levaquin. Also add Benadryl and Pepcid for now.  6.Recent C. difficile colitis. Patient was treated with IV antibiotics last admission and developed diarrhea and was positive for C. difficile colitis. Treated with oral vancomycin. Currently there is a risk for giving her IV Levaquin but given her allergic reaction no other good option for now.    7.  Severe protein calorie malnutrition. Continue nutrition. Most likely secondary to Hodgkin's lymphoma. Palliative care visit last admission  Diet: Regular diet DVT Prophylaxis: subcutaneous Heparin  Advance goals  of care discussion: full code  Family  Communication: no family was present at bedside, at the time of interview.   Date: 01/03/2019  3:33 PM  Patient Isolation: Droplet+Contact HCP PPE: Wearing all recommended PPE PAPR + Gown + Gloves + Surgical Cap Patient PPE: None   Disposition:  Discharge to home in 1 to 2 days. .  Consultants: none phone discussion with oncology Procedures: none  Scheduled Meds: . sodium chloride   Intravenous Once  . chlorpheniramine-HYDROcodone  5 mL Oral Q12H  . diphenhydrAMINE  25 mg Oral BID  . famotidine  20 mg Oral BID  . levothyroxine  75 mcg Oral Q0600  . magnesium oxide  400 mg Oral BID  . multivitamin with minerals  1 tablet Oral Daily   Continuous Infusions: . levofloxacin (LEVAQUIN) IV 750 mg (01/03/19 1408)   PRN Meds: acetaminophen, ondansetron (ZOFRAN) IV, oxyCODONE, sodium chloride flush Antibiotics: Anti-infectives (From admission, onward)   Start     Dose/Rate Route Frequency Ordered Stop   01/03/19 1015  fluconazole (DIFLUCAN) tablet 200 mg     200 mg Oral  Once 01/03/19 1014 01/03/19 1108   01/02/19 1200  levofloxacin (LEVAQUIN) IVPB 750 mg     750 mg 100 mL/hr over 90 Minutes Intravenous Every 24 hours 01/02/19 1008     01/02/19 1000  doxycycline (VIBRA-TABS) tablet 100 mg  Status:  Discontinued     100 mg Oral Every 12 hours 01/02/19 0956 01/02/19 1001   01/01/19 1200  cefTRIAXone (ROCEPHIN) 1 g in sodium chloride 0.9 % 100 mL IVPB  Status:  Discontinued     1 g 200 mL/hr over 30 Minutes Intravenous Every 24 hours 01/01/19 0239 01/02/19 0956   01/01/19 1200  azithromycin (ZITHROMAX) 500 mg in sodium chloride 0.9 % 250 mL IVPB  Status:  Discontinued     500 mg 250 mL/hr over 60 Minutes Intravenous Every 24 hours 01/01/19 0239 01/02/19 0956       Objective: Physical Exam: Vitals:   01/03/19 1047 01/03/19 1115 01/03/19 1230 01/03/19 1400  BP: (!) 116/43 109/63  102/66  Pulse: (!) 114 (!) 113  86  Resp: (!) 21 15  (!) 22  Temp: 98.1 F (36.7 C) (!) 100.5 F  (38.1 C) 98.7 F (37.1 C) 98.2 F (36.8 C)  TempSrc: Oral Oral Oral Oral  SpO2: 98% 97%  98%  Weight:    54.5 kg  Height:        Intake/Output Summary (Last 24 hours) at 01/03/2019 1533 Last data filed at 01/03/2019 0800 Gross per 24 hour  Intake 710 ml  Output -  Net 710 ml   Filed Weights   01/01/19 0639 01/03/19 1400  Weight: 54.8 kg 54.5 kg   General: Alert, Awake and Oriented to Time, Place and Person. Appear in moderate distress, affect appropriate Eyes: PERRL, Conjunctiva normal ENT: Oral Mucosa clear moist. Neck: no JVD, no Abnormal Mass Or lumps Cardiovascular: S1 and S2 Present, no Murmur, Peripheral Pulses Present Respiratory: normal respiratory effort, Bilateral Air entry equal and Decreased, no use of accessory muscle, Clear to Auscultation, no Crackles, no wheezes Abdomen: Bowel Sound present, Soft and no tenderness, no hernia Skin: bilateral upper extremity redness, no Rash, no induration Extremities: no Pedal edema, no calf tenderness Neurologic: Grossly no focal neuro deficit. Bilaterally Equal motor strength  Data Reviewed: CBC: Recent Labs  Lab 01/01/19 0454 01/02/19 0805 01/03/19 0402  WBC 5.2 4.3 3.9*  NEUTROABS  --  3.5 2.9  HGB 7.2*  7.2* 6.4*  HCT 21.4* 21.7* 19.7*  MCV 90.3 91.2 90.8  PLT 77* PLATELET CLUMPS NOTED ON SMEAR, UNABLE TO ESTIMATE 61*   Basic Metabolic Panel: Recent Labs  Lab 01/01/19 0454 01/02/19 0805 01/03/19 0402  NA 130* 133* 130*  K 3.4* 3.9 3.7  CL 98 98 97*  CO2 23 27 26   GLUCOSE 85 97 100*  BUN 13 13 11   CREATININE 0.61 0.68 0.72  CALCIUM 7.3* 7.5* 7.6*  MG  --  1.7 1.7    Liver Function Tests: Recent Labs  Lab 01/01/19 0454 01/02/19 0805 01/03/19 0402  AST 99* 98* 152*  ALT 45* 40 48*  ALKPHOS 285* 450* 513*  BILITOT 1.4* 1.3* 1.6*  PROT 5.6* 5.3* 5.3*  ALBUMIN 1.4* 1.4* 1.3*   No results for input(s): LIPASE, AMYLASE in the last 168 hours. No results for input(s): AMMONIA in the last 168 hours.  Coagulation Profile: No results for input(s): INR, PROTIME in the last 168 hours. Cardiac Enzymes: No results for input(s): CKTOTAL, CKMB, CKMBINDEX, TROPONINI in the last 168 hours. BNP (last 3 results) No results for input(s): PROBNP in the last 8760 hours. CBG: No results for input(s): GLUCAP in the last 168 hours. Studies: No results found.   Time spent: 35 minutes  Author: Berle Mull, MD [facility] 01/03/2019 3:33 PM  To reach On-call, see care teams to locate the attending and reach out to them via [email or web address]. If 7PM-7AM, please contact night-coverage If you still have difficulty reaching the attending provider, please page the [facility] (Director on Call) for [facility] on amion for assistance.

## 2019-01-04 ENCOUNTER — Inpatient Hospital Stay (HOSPITAL_COMMUNITY): Payer: Medicaid Other

## 2019-01-04 DIAGNOSIS — C8190 Hodgkin lymphoma, unspecified, unspecified site: Secondary | ICD-10-CM

## 2019-01-04 DIAGNOSIS — R6889 Other general symptoms and signs: Secondary | ICD-10-CM

## 2019-01-04 DIAGNOSIS — D61818 Other pancytopenia: Secondary | ICD-10-CM

## 2019-01-04 LAB — CBC WITH DIFFERENTIAL/PLATELET
Abs Immature Granulocytes: 0.14 10*3/uL — ABNORMAL HIGH (ref 0.00–0.07)
Basophils Absolute: 0.1 10*3/uL (ref 0.0–0.1)
Basophils Relative: 0 %
Eosinophils Absolute: 0 10*3/uL (ref 0.0–0.5)
Eosinophils Relative: 0 %
HCT: 29.9 % — ABNORMAL LOW (ref 36.0–46.0)
Hemoglobin: 10.4 g/dL — ABNORMAL LOW (ref 12.0–15.0)
Immature Granulocytes: 1 %
Lymphocytes Relative: 14 %
Lymphs Abs: 3 10*3/uL (ref 0.7–4.0)
MCH: 31.2 pg (ref 26.0–34.0)
MCHC: 34.8 g/dL (ref 30.0–36.0)
MCV: 89.8 fL (ref 80.0–100.0)
Monocytes Absolute: 1.4 10*3/uL — ABNORMAL HIGH (ref 0.1–1.0)
Monocytes Relative: 6 %
Neutro Abs: 17 10*3/uL — ABNORMAL HIGH (ref 1.7–7.7)
Neutrophils Relative %: 79 %
Platelets: 156 10*3/uL (ref 150–400)
RBC: 3.33 MIL/uL — ABNORMAL LOW (ref 3.87–5.11)
RDW: 14.1 % (ref 11.5–15.5)
WBC: 21.6 10*3/uL — ABNORMAL HIGH (ref 4.0–10.5)
nRBC: 0 % (ref 0.0–0.2)

## 2019-01-04 LAB — URINALYSIS, ROUTINE W REFLEX MICROSCOPIC
Bilirubin Urine: NEGATIVE
Glucose, UA: NEGATIVE mg/dL
Hgb urine dipstick: NEGATIVE
Ketones, ur: NEGATIVE mg/dL
Leukocytes,Ua: NEGATIVE
Nitrite: NEGATIVE
Protein, ur: 30 mg/dL — AB
Specific Gravity, Urine: 1.019 (ref 1.005–1.030)
pH: 6 (ref 5.0–8.0)

## 2019-01-04 LAB — TYPE AND SCREEN
ABO/RH(D): AB NEG
Antibody Screen: NEGATIVE
Unit division: 0

## 2019-01-04 LAB — COMPREHENSIVE METABOLIC PANEL
ALT: 12 U/L (ref 0–44)
AST: 14 U/L — ABNORMAL LOW (ref 15–41)
Albumin: 2.3 g/dL — ABNORMAL LOW (ref 3.5–5.0)
Alkaline Phosphatase: 153 U/L — ABNORMAL HIGH (ref 38–126)
Anion gap: 14 (ref 5–15)
BUN: 19 mg/dL (ref 6–20)
CO2: 25 mmol/L (ref 22–32)
Calcium: 8.1 mg/dL — ABNORMAL LOW (ref 8.9–10.3)
Chloride: 96 mmol/L — ABNORMAL LOW (ref 98–111)
Creatinine, Ser: 1.47 mg/dL — ABNORMAL HIGH (ref 0.44–1.00)
GFR calc Af Amer: 47 mL/min — ABNORMAL LOW (ref >60)
GFR calc non Af Amer: 40 mL/min — ABNORMAL LOW (ref >60)
Glucose, Bld: 213 mg/dL — ABNORMAL HIGH (ref 70–99)
Potassium: 3.4 mmol/L — ABNORMAL LOW (ref 3.5–5.1)
Sodium: 135 mmol/L (ref 135–145)
Total Bilirubin: 0.6 mg/dL (ref 0.3–1.2)
Total Protein: 5.6 g/dL — ABNORMAL LOW (ref 6.5–8.1)

## 2019-01-04 LAB — BPAM RBC
Blood Product Expiration Date: 202004272359
ISSUE DATE / TIME: 202004101025
Unit Type and Rh: 600

## 2019-01-04 LAB — FERRITIN: Ferritin: 647 ng/mL — ABNORMAL HIGH (ref 11–307)

## 2019-01-04 LAB — C-REACTIVE PROTEIN: CRP: 30.8 mg/dL — ABNORMAL HIGH (ref <1.0)

## 2019-01-04 LAB — PROCALCITONIN: Procalcitonin: 0.36 ng/mL

## 2019-01-04 LAB — LACTATE DEHYDROGENASE: LDH: 431 U/L — ABNORMAL HIGH (ref 98–192)

## 2019-01-04 MED ORDER — ACETAMINOPHEN 325 MG PO TABS
325.0000 mg | ORAL_TABLET | Freq: Once | ORAL | Status: AC
  Administered 2019-01-04: 325 mg via ORAL
  Filled 2019-01-04: qty 1

## 2019-01-04 MED ORDER — SODIUM CHLORIDE 0.9 % IV SOLN
2.0000 g | Freq: Two times a day (BID) | INTRAVENOUS | Status: DC
  Administered 2019-01-04 – 2019-01-05 (×2): 2 g via INTRAVENOUS
  Filled 2019-01-04 (×4): qty 2

## 2019-01-04 MED ORDER — SODIUM CHLORIDE 0.9 % IV SOLN
1.0000 g | Freq: Three times a day (TID) | INTRAVENOUS | Status: DC
  Administered 2019-01-04: 1 g via INTRAVENOUS
  Filled 2019-01-04 (×6): qty 1

## 2019-01-04 MED ORDER — SODIUM CHLORIDE 0.9 % IV SOLN
INTRAVENOUS | Status: DC
  Administered 2019-01-04 (×2): via INTRAVENOUS

## 2019-01-04 MED ORDER — POTASSIUM CHLORIDE CRYS ER 20 MEQ PO TBCR
40.0000 meq | EXTENDED_RELEASE_TABLET | Freq: Once | ORAL | Status: AC
  Administered 2019-01-04: 40 meq via ORAL
  Filled 2019-01-04: qty 2

## 2019-01-04 MED ORDER — VANCOMYCIN HCL 10 G IV SOLR
1250.0000 mg | Freq: Once | INTRAVENOUS | Status: AC
  Administered 2019-01-04: 1250 mg via INTRAVENOUS
  Filled 2019-01-04: qty 1250

## 2019-01-04 MED ORDER — ACETAMINOPHEN 325 MG PO TABS
650.0000 mg | ORAL_TABLET | ORAL | Status: DC | PRN
  Administered 2019-01-04 – 2019-01-07 (×6): 650 mg via ORAL
  Filled 2019-01-04 (×6): qty 2

## 2019-01-04 MED ORDER — VANCOMYCIN HCL IN DEXTROSE 750-5 MG/150ML-% IV SOLN
750.0000 mg | Freq: Two times a day (BID) | INTRAVENOUS | Status: DC
  Filled 2019-01-04 (×2): qty 150

## 2019-01-04 MED ORDER — VANCOMYCIN VARIABLE DOSE PER UNSTABLE RENAL FUNCTION (PHARMACIST DOSING): Status: DC

## 2019-01-04 NOTE — Progress Notes (Signed)
At 2218 the patient had a temperature of 102.1, the patient was giving 650mg  Tylenol. After rechecking the temperature about an hour later patient's temperature was only down to 101.7. At that time the doctor was page and asked if there were any recommendations. The doctor was also asked if patient could have Motrin. Dr. Vicente Males contacted me back by phone and stated that the patient was a COVID rule out and could not have any NASID, however the patient could be given another 325mg  of Tylenol. Rodman Pickle RN contact the doctor by page again to clarify whether the patient was indeed ruled out for COVID. It was explained by Rodman Pickle RN that the patient has been spiking temperatures as high as 102 at this time and has been doing so for a number of shifts now, he also asked if it would be possible for the patient to be moved back to the pervious floor due to the fact that she continues to have a high temperature. The [facility] was contacted and was brought up to speed about the current situation, he stated that he 1st wanted to just speak to the nurse on the phone and not hear the others in the background. The [facility] stated that the nurse either needs to have the people in the background be quiet or the nurse she should find a different area to speak with him because this conversation was between just the two of them. Rodman Pickle RN and I did left the nursing station to talk to the [facility]. The [facility] stated that the Doctor should clarify if the patient would indeed be tested again in the morning. The [facility] requested a call back. Dr. Vicente Males was paged again, to which he contacted Rodman Pickle RN by phone again and asked if the patient would definitely be tested again. Dr. Vicente Males stated that he was not 100% sure if the patient would be tested and that decision would be up to the doctor that would be reporting in the morning.  After contacting the [facility] again to updated him as to what the doctor said. He then instructed Meyer Cory not to be alarmed and not  to alarm the oncoming staff. The patient was then reassessed and she was found to have a temperature of 102.6. The patient was given another 650mg  of Tylenol. Dr. Vicente Males was page for the third time, with the new temperature reading. Dr. Vicente Males returned the page with a call to [employer] RN stating, "there is absolutely nothing else I can advise for the patient at this time, we will just have to make do with the Tylenol and hopefully that fever will subside".

## 2019-01-04 NOTE — Progress Notes (Signed)
Pt admitted to the unit as transfer from 5w; pt A&O x4; pt oriented to the unit and room; telemetry applied and verified with CCMD: NT called to second verify; VSS; skin intact with no pressure ulcer noted; oral care done; pt in bed with call light within reach. Will closely monitor. Delia Heady RN   01/04/19 0949  Vitals  Temp (!) 101.2 F (38.4 C)  Temp Source Oral  BP 109/71  MAP (mmHg) 82  BP Location Left Arm  BP Method Automatic  Patient Position (if appropriate) Lying  Pulse Rate (!) 105  Pulse Rate Source Monitor  Resp 16  Oxygen Therapy  SpO2 96 %  O2 Device Room Air  Pain Assessment  Pain Scale 0-10  Pain Score 0  MEWS Score  MEWS RR 0  MEWS Pulse 1  MEWS Systolic 0  MEWS LOC 0  MEWS Temp 1  MEWS Score 2  MEWS Score Color Yellow

## 2019-01-04 NOTE — Progress Notes (Addendum)
Pharmacy Antibiotic Note  Cindy Ward is a 53 y.o. female admitted on 01/01/2019 with cough and malaise. Pt was started on CTX and azithromycin for CAP. Of note, pt had rash to CTX and therapy was changed to levofloxacin monotherapy. Pt persistentely febrile and now Pharmacy has been consulted for Vanc and Merrem dosing. SCR increased to 1.47 (CrCl ~19ml/min), WBC low  Plan: DC levaquin Vanc 1250mg  IV x1  Monitor renal function and dose vanc based on clearance for subsequent doses Merrem 2g IV Q12hrs Monitor SCR, clinical improvement, LOT, de-escalation of abx  Height: 5\' 8"  (172.7 cm) Weight: 129 lb 3 oz (58.6 kg) IBW/kg (Calculated) : 63.9  Temp (24hrs), Avg:99.9 F (37.7 C), Min:98.1 F (36.7 C), Max:102.6 F (39.2 C)  Recent Labs  Lab 01/01/19 0454 01/02/19 0805 01/03/19 0402 01/03/19 1657  WBC 5.2 4.3 3.9* 3.3*  CREATININE 0.61 0.68 0.72  --     Estimated Creatinine Clearance: 75.2 mL/min (by C-G formula based on SCr of 0.72 mg/dL).    Allergies  Allergen Reactions  . Ceftriaxone Rash    Errythmatic rash to bilateral upper extremities and face; warm to touch    Antimicrobials this admission: Ceftriaxone 4/8>>4/9 Azithromycin 4/8>>4/9 Levofloxacin 4/9>>4/11 Vanc 4/11>> Merrem 4/11>>  Dose adjustments this admission: NA  Microbiology results: 4/8 blood x 2 NGTD 4/8 resp pcr neg 4/8 covid neg  Thank you for allowing pharmacy to be a part of this patient's care.  Juanell Fairly, PharmD PGY1 Pharmacy Resident 01/04/2019 8:12 AM

## 2019-01-04 NOTE — Progress Notes (Signed)
Patient transferred to 2W-05. Belongings at bedside.

## 2019-01-04 NOTE — Progress Notes (Signed)
Patient ID: Cindy Ward, female   DOB: July 21, 1966, 53 y.o.   MRN: [id number]  PROGRESS NOTE    Cindy Ward  QIW:[id number] DOB: 1965/12/06 DOA: 01/01/2019 PCP: [name], MD   Brief Narrative:  53 year old female with history of recent diagnosis of stage IV Hodgkin's lymphoma status post 1 cycle of chemotherapy on 11/19/2018, recent admission for E. coli and group B strep bacteremia in March 2020 treated with IV Rocephin with subsequent C. difficile colitis treated with oral vancomycin presented to [facility] ED on 01/01/2019 complaining of cough and malaise.  CT chest was suggestive of tree-in-bud nodularity in the left lower lobe, compatible with infection and patient had new oxygen requirement of 2 L.  She was transferred to [facility] for COVID-19 rule out.  She was started on Rocephin and Zithromax, subsequently developed upper extremity bilateral redness and antibiotics were changed to Levaquin.  COVID-19 test was negative.  Assessment & Plan:   Principal Problem:   CAP (community acquired pneumonia) Active Problems:   Sepsis ([place])   Acute respiratory failure with hypoxia ([place])   Emesis   Anemia associated with chemotherapy  Persistent fever/rule out COVID-19 -Patient has had persistent temperature spikes overnight without worsening shortness of breath, abdominal pain or diarrhea.  She was switched to Levaquin on 01/02/2019.  E. coli on 11/25/2018 was resistant to ciprofloxacin. -We will switch antibiotics to meropenem and vancomycin.  Repeat blood cultures, urinalysis and urine cultures.  Portable chest x-ray.  No worsening oxygen requirement; currently on room air. -Her COVID-19 testing done at [facility] was reported as negative.  Because of persistent temperature spikes, will repeat COVID-19 testing to rule out false negative test.  We will also repeat CBC, CMP, procalcitonin, CRP, ferritin.  She is probably still a low risk for COVID-19 and her temperature spikes can be  secondary to her underlying lymphoma and bacterial pneumonia. -We will continue droplet and contact isolation  Healthcare associated left lower lobe pneumonia -CT chest in [facility] showed patchy tree-in-bud opacities in the left lower lobe -Follow cultures.  Antibiotic plan as above  Stage IV Hodgkin's lymphoma status post 1 cycle of chemotherapy on 11/19/2018 Pancytopenia -Patient did not get any further chemotherapy due to bacteremia/sepsis and prolonged hospitalization in March 2020.  Oncology recommendations appreciated.  Oncology recommends supportive measures for now and outpatient follow-up with regular oncologist in [facility] for follow-up.  Transfuse if hemoglobin less than 7  Hypothyroidism--continue Synthroid  Upper extremity bilateral redness--patient reported that she gets this tightness every time she gets antibiotics. -Redness improving.  Monitor. -She had similar presentation during last admission and work-up included complement levels, HIV, ANCA, hepatitis, rheumatoid factor which were all negative -Rocephin and Zithromax have been changed to IV Levaquin because of the same  Severe protein calorie malnutrition--follow nutrition recommendations.  Patient had palliative care evaluation during last admission.    DVT prophylaxis: Heparin Code Status: Full Family Communication: None at bedside Disposition Plan: Depends on clinical outcome  Consultants: None  Procedures: None  Antimicrobials:  Anti-infectives (From admission, onward)   Start     Dose/Rate Route Frequency Ordered Stop   01/04/19 2100  vancomycin (VANCOCIN) IVPB 750 mg/150 ml premix     750 mg 150 mL/hr over 60 Minutes Intravenous Every 12 hours 01/04/19 0809     01/04/19 0900  meropenem (MERREM) 1 g in sodium chloride 0.9 % 100 mL IVPB     1 g 200 mL/hr over 30 Minutes Intravenous Every 8 hours 01/04/19 0809  01/04/19 0830  vancomycin (VANCOCIN) 1,250 mg in sodium chloride 0.9 % 250 mL IVPB      1,250 mg 166.7 mL/hr over 90 Minutes Intravenous  Once 01/04/19 0809     01/03/19 1015  fluconazole (DIFLUCAN) tablet 200 mg     200 mg Oral  Once 01/03/19 1014 01/03/19 1108   01/02/19 1200  levofloxacin (LEVAQUIN) IVPB 750 mg  Status:  Discontinued     750 mg 100 mL/hr over 90 Minutes Intravenous Every 24 hours 01/02/19 1008 01/04/19 0809   01/02/19 1000  doxycycline (VIBRA-TABS) tablet 100 mg  Status:  Discontinued     100 mg Oral Every 12 hours 01/02/19 0956 01/02/19 1001   01/01/19 1200  cefTRIAXone (ROCEPHIN) 1 g in sodium chloride 0.9 % 100 mL IVPB  Status:  Discontinued     1 g 200 mL/hr over 30 Minutes Intravenous Every 24 hours 01/01/19 0239 01/02/19 0956   01/01/19 1200  azithromycin (ZITHROMAX) 500 mg in sodium chloride 0.9 % 250 mL IVPB  Status:  Discontinued     500 mg 250 mL/hr over 60 Minutes Intravenous Every 24 hours 01/01/19 0239 01/02/19 0956        Subjective: Patient seen and examined at bedside.  She denies any worsening cough, shortness of breath, abdominal pain, diarrhea or hematuria.  Feels well  Objective: Patient was examined using the following PPE: Gown, gloves, and 95, surgical mask, face shield. Vitals:   01/04/19 0436 01/04/19 0519 01/04/19 0534 01/04/19 0834  BP:   131/74 (!) 96/53  Pulse:   (!) 108 (!) 102  Resp:    19  Temp:  (!) 102.6 F (39.2 C)  (!) 100.9 F (38.3 C)  TempSrc:  Oral  Oral  SpO2:   100% 99%  Weight: 58.6 kg     Height:        Intake/Output Summary (Last 24 hours) at 01/04/2019 0848 Last data filed at 01/04/2019 0846 Gross per 24 hour  Intake -  Output 650 ml  Net -650 ml   Filed Weights   01/01/19 0639 01/03/19 1400 01/04/19 0436  Weight: 54.8 kg 54.5 kg 58.6 kg    Examination:  General exam: Appears calm and comfortable  Respiratory system: Bilateral decreased breath sounds at bases with some scattered crackles Cardiovascular system: S1 & S2 heard, slightly tachycardic Gastrointestinal system: Abdomen  is nondistended, soft and nontender. Normal bowel sounds heard. Extremities: No cyanosis, clubbing, edema  Central nervous system: Alert and oriented. No focal neurological deficits. Moving extremities Skin: No rashes, lesions or ulcers.  Right upper extremity is slightly more red than left. Psychiatry: Flat affect    Data Reviewed: I have personally reviewed following labs and imaging studies  CBC: Recent Labs  Lab 01/01/19 0454 01/02/19 0805 01/03/19 0402 01/03/19 1657  WBC 5.2 4.3 3.9* 3.3*  NEUTROABS  --  3.5 2.9  --   HGB 7.2* 7.2* 6.4* 7.4*  HCT 21.4* 21.7* 19.7* 22.0*  MCV 90.3 91.2 90.8 88.0  PLT 77* PLATELET CLUMPS NOTED ON SMEAR, UNABLE TO ESTIMATE 61* 55*   Basic Metabolic Panel: Recent Labs  Lab 01/01/19 0454 01/02/19 0805 01/03/19 0402  NA 130* 133* 130*  K 3.4* 3.9 3.7  CL 98 98 97*  CO2 23 27 26   GLUCOSE 85 97 100*  BUN 13 13 11   CREATININE 0.61 0.68 0.72  CALCIUM 7.3* 7.5* 7.6*  MG  --  1.7 1.7   GFR: Estimated Creatinine Clearance: 75.2 mL/min (by C-G formula based  on SCr of 0.72 mg/dL). Liver Function Tests: Recent Labs  Lab 01/01/19 0454 01/02/19 0805 01/03/19 0402  AST 99* 98* 152*  ALT 45* 40 48*  ALKPHOS 285* 450* 513*  BILITOT 1.4* 1.3* 1.6*  PROT 5.6* 5.3* 5.3*  ALBUMIN 1.4* 1.4* 1.3*   No results for input(s): LIPASE, AMYLASE in the last 168 hours. No results for input(s): AMMONIA in the last 168 hours. Coagulation Profile: No results for input(s): INR, PROTIME in the last 168 hours. Cardiac Enzymes: No results for input(s): CKTOTAL, CKMB, CKMBINDEX, TROPONINI in the last 168 hours. BNP (last 3 results) No results for input(s): PROBNP in the last 8760 hours. HbA1C: No results for input(s): HGBA1C in the last 72 hours. CBG: No results for input(s): GLUCAP in the last 168 hours. Lipid Profile: No results for input(s): CHOL, HDL, LDLCALC, TRIG, CHOLHDL, LDLDIRECT in the last 72 hours. Thyroid Function Tests: No results for  input(s): TSH, T4TOTAL, FREET4, T3FREE, THYROIDAB in the last 72 hours. Anemia Panel: Recent Labs    01/02/19 0805  FERRITIN >7,500*   Sepsis Labs: Recent Labs  Lab 01/02/19 0805  PROCALCITON 2.12    Recent Results (from the past 240 hour(s))  Culture, blood (routine x 2)     Status: None (Preliminary result)   Collection Time: 01/01/19  3:29 AM  Result Value Ref Range Status   Specimen Description BLOOD LEFT ARM  Final   Special Requests   Final    BOTTLES DRAWN AEROBIC ONLY Blood Culture adequate volume   Culture   Final    NO GROWTH 3 DAYS Performed at [facility], [address], [place], [place] [address]    Report Status PENDING  Incomplete  Culture, blood (routine x 2)     Status: None (Preliminary result)   Collection Time: 01/01/19  3:29 AM  Result Value Ref Range Status   Specimen Description BLOOD LEFT ARM  Final   Special Requests   Final    BOTTLES DRAWN AEROBIC ONLY Blood Culture adequate volume   Culture   Final    NO GROWTH 3 DAYS Performed at [facility], [address], [place], [place] [address]    Report Status PENDING  Incomplete  Novel Coronavirus, NAA (hospital order; send-out to ref lab)     Status: None   Collection Time: 01/01/19  4:05 AM  Result Value Ref Range Status   SARS-CoV-2, NAA NOT DETECTED NOT DETECTED Final    Comment: Negative (Not Detected) results do not exclude infection caused by SARS CoV 2 and should not be used as the sole basis for treatment or other patient management decisions. Optimum specimen types and timing for peak viral levels during infections caused  by SARS CoV 2 have not been determined. Collection of multiple specimens (types and time points) from the same patient may be necessary to detect the virus. Improper specimen collection and handling, sequence variability underlying assay primers and or probes, or the presence of organisms in  quantities less than the limit of detection of the assay may lead  to false negative results. Positive and negative predictive values of testing are highly dependent on prevalence. False negative results are more likely when prevalence of disease is high. (NOTE) The expected result is Negative (Not Detected). The SARS CoV 2 test is intended for the presumptive qualitative  detection of nucleic acid from SARS CoV 2 in upper and lower  respir atory specimens. Testing methodology is real time RT PCR. Test  results must be correlated with clinical presentation and  evaluated in the context of other laboratory and epidemiologic data.  Test performance can be affected because the epidemiology and  clinical spectrum of infection caused by SARS CoV 2 is not fully  known. For example, the optimum types of specimens to collect and  when during the course of infection these specimens are most likely  to contain detectable viral RNA may not be known. This test has not been Food and Drug Administration (FDA) cleared or  approved and has been authorized by FDA under an Emergency Use  Authorization (EUA). The test is only authorized for the duration of  the declaration that circumstances exist justifying the authorization  of emergency use of in vitro diagnostic tests for detection and or  diagnosis of SARS CoV 2 under Section 564(b)(1) of the Act, 21 U.S.C.  section [phone] 3(b)(1), unless the authorization is terminated or   revoked sooner. [facility] is certified under the  Clinical Laboratory Improvement Amendments of 1988 (CLIA), 42 U.S.C.  section [phone], to perform high complexity tests. Performed at [id number] [address], [place], [place] [address] Laboratory Director: Loleta Books, MD Fact Sheet for Healthcare Providers  BankingDealers.co.za Fact Sheet for Patients  StrictlyIdeas.no    Coronavirus Source NASOPHARYNGEAL  Final     Comment: Performed at [facility], [address] [place], [place] [address]  Respiratory Panel by PCR     Status: None   Collection Time: 01/01/19  4:05 AM  Result Value Ref Range Status   Adenovirus NOT DETECTED NOT DETECTED Final   Coronavirus 229E NOT DETECTED NOT DETECTED Final    Comment: (NOTE) The Coronavirus on the Respiratory Panel, DOES NOT test for the novel  Coronavirus (2019 nCoV)    Coronavirus HKU1 NOT DETECTED NOT DETECTED Final   Coronavirus NL63 NOT DETECTED NOT DETECTED Final   Coronavirus OC43 NOT DETECTED NOT DETECTED Final   Metapneumovirus NOT DETECTED NOT DETECTED Final   Rhinovirus / Enterovirus NOT DETECTED NOT DETECTED Final   Influenza A NOT DETECTED NOT DETECTED Final   Influenza B NOT DETECTED NOT DETECTED Final   Parainfluenza Virus 1 NOT DETECTED NOT DETECTED Final   Parainfluenza Virus 2 NOT DETECTED NOT DETECTED Final   Parainfluenza Virus 3 NOT DETECTED NOT DETECTED Final   Parainfluenza Virus 4 NOT DETECTED NOT DETECTED Final   Respiratory Syncytial Virus NOT DETECTED NOT DETECTED Final   Bordetella pertussis NOT DETECTED NOT DETECTED Final   Chlamydophila pneumoniae NOT DETECTED NOT DETECTED Final   Mycoplasma pneumoniae NOT DETECTED NOT DETECTED Final    Comment: Performed at [facility], [place] [address], [place], [place] [address]  MRSA PCR Screening     Status: None   Collection Time: 01/01/19  4:05 AM  Result Value Ref Range Status   MRSA by PCR NEGATIVE NEGATIVE Final    Comment:        The GeneXpert MRSA Assay (FDA approved for NASAL specimens only), is one component of a comprehensive MRSA colonization surveillance program. It is not intended to diagnose MRSA infection nor to guide or monitor treatment for MRSA infections. Performed at [facility], [place] [address], [place], [place] [address]          Radiology Studies: No results found.      Scheduled Meds: . sodium chloride   Intravenous Once  .  chlorpheniramine-HYDROcodone  5 mL  Oral Q12H  . diphenhydrAMINE  25 mg Oral BID  . famotidine  20 mg Oral BID  . levothyroxine  75 mcg Oral Q0600  . magnesium oxide  400 mg Oral BID  . multivitamin with minerals  1 tablet Oral Daily   Continuous Infusions: . meropenem (MERREM) IV    . vancomycin    . vancomycin       LOS: 3 days        Aline August, MD [facility] 01/04/2019, 8:48 AM

## 2019-01-05 LAB — COMPREHENSIVE METABOLIC PANEL
ALT: 57 U/L — ABNORMAL HIGH (ref 0–44)
AST: 219 U/L — ABNORMAL HIGH (ref 15–41)
Albumin: 1.2 g/dL — ABNORMAL LOW (ref 3.5–5.0)
Alkaline Phosphatase: 379 U/L — ABNORMAL HIGH (ref 38–126)
Anion gap: 13 (ref 5–15)
BUN: 12 mg/dL (ref 6–20)
CO2: 23 mmol/L (ref 22–32)
Calcium: 7.1 mg/dL — ABNORMAL LOW (ref 8.9–10.3)
Chloride: 98 mmol/L (ref 98–111)
Creatinine, Ser: 0.69 mg/dL (ref 0.44–1.00)
GFR calc Af Amer: >60 mL/min (ref >60)
GFR calc non Af Amer: >60 mL/min (ref >60)
Glucose, Bld: 62 mg/dL — ABNORMAL LOW (ref 70–99)
Potassium: 3.5 mmol/L (ref 3.5–5.1)
Sodium: 134 mmol/L — ABNORMAL LOW (ref 135–145)
Total Bilirubin: 2.5 mg/dL — ABNORMAL HIGH (ref 0.3–1.2)
Total Protein: 5.3 g/dL — ABNORMAL LOW (ref 6.5–8.1)

## 2019-01-05 LAB — CBC WITH DIFFERENTIAL/PLATELET
Abs Immature Granulocytes: 0.05 10*3/uL (ref 0.00–0.07)
Basophils Absolute: 0 10*3/uL (ref 0.0–0.1)
Basophils Relative: 0 %
Eosinophils Absolute: 0 10*3/uL (ref 0.0–0.5)
Eosinophils Relative: 0 %
HCT: 24.3 % — ABNORMAL LOW (ref 36.0–46.0)
Hemoglobin: 7.8 g/dL — ABNORMAL LOW (ref 12.0–15.0)
Immature Granulocytes: 1 %
Lymphocytes Relative: 18 %
Lymphs Abs: 0.8 10*3/uL (ref 0.7–4.0)
MCH: 28.9 pg (ref 26.0–34.0)
MCHC: 32.1 g/dL (ref 30.0–36.0)
MCV: 90 fL (ref 80.0–100.0)
Monocytes Absolute: 0.3 10*3/uL (ref 0.1–1.0)
Monocytes Relative: 6 %
Neutro Abs: 3.2 10*3/uL (ref 1.7–7.7)
Neutrophils Relative %: 75 %
Platelets: 56 10*3/uL — ABNORMAL LOW (ref 150–400)
RBC: 2.7 MIL/uL — ABNORMAL LOW (ref 3.87–5.11)
RDW: 17.8 % — ABNORMAL HIGH (ref 11.5–15.5)
WBC: 4.3 10*3/uL (ref 4.0–10.5)
nRBC: 0 % (ref 0.0–0.2)

## 2019-01-05 LAB — URINE CULTURE: Culture: NO GROWTH

## 2019-01-05 LAB — MAGNESIUM: Magnesium: 1.7 mg/dL (ref 1.7–2.4)

## 2019-01-05 MED ORDER — VANCOMYCIN HCL 1000 MG IV SOLR
1000.0000 mg | INTRAVENOUS | Status: DC
  Administered 2019-01-05: 1000 mg via INTRAVENOUS
  Filled 2019-01-05 (×3): qty 1000

## 2019-01-05 MED ORDER — METOPROLOL TARTRATE 12.5 MG HALF TABLET
12.5000 mg | ORAL_TABLET | Freq: Two times a day (BID) | ORAL | Status: DC
  Administered 2019-01-05 – 2019-01-07 (×4): 12.5 mg via ORAL
  Filled 2019-01-05 (×4): qty 1

## 2019-01-05 MED ORDER — LEVOTHYROXINE SODIUM 50 MCG PO TABS
50.0000 ug | ORAL_TABLET | Freq: Every day | ORAL | Status: DC
  Administered 2019-01-06 – 2019-01-08 (×3): 50 ug via ORAL
  Filled 2019-01-05 (×3): qty 1

## 2019-01-05 MED ORDER — SODIUM CHLORIDE 0.9 % IV SOLN
2.0000 g | Freq: Three times a day (TID) | INTRAVENOUS | Status: DC
  Administered 2019-01-05 – 2019-01-06 (×3): 2 g via INTRAVENOUS
  Filled 2019-01-05 (×6): qty 2

## 2019-01-05 NOTE — Progress Notes (Signed)
Pt fever went back up to 102.7, MD notified and RN gave pt tylenol will recheck and f/u

## 2019-01-05 NOTE — Progress Notes (Signed)
[facility] Progress Note  Patient: Cindy Ward YKD:[id number]   PCP: [name], MD DOB: April 28, 1966   DOA: 01/01/2019   DOS: 01/05/2019   Date of Service: the patient was seen and examined on 01/05/2019  Brief hospital course: Pt. with PMH of HTN, stage IV Hodgkin's lymphoma S/P 1 cycle of chemotherapy and 11/14/2018, recent admission for E. coli and group B strep bacteremia; admitted on 01/01/2019, presented with complaint of generalized weakness and fever and cough, was found to have healthcare associated pneumonia. Currently further plan is continue IV antibiotics.  Subjective: Patient is feeling better no nausea no vomiting.  Still had low-grade fever this morning.  No diarrhea.  Assessment and Plan: 1.  Healthcare associated pneumonia. Presents with cough and shortness of breath.  Chest x-ray shows no consolidation.  CT chest in outside facility shows patchy tree-in-bud opacities in the left lower lobe. Initially was hypoxic now on room air. No leukocytosis but actually developing pancytopenia. Initially treated with IV ceftriaxone and azithromycin. changed to IV Levaquin given her allergic reaction. Due to persistent fever patient was given vancomycin and meropenem to broaden her coverage. Continue to monitor blood cultures.  2.  Rule out COVID. With cough and shortness of breath as well as fever with CT findings patient is currently awaiting the results of the COVID RT-PCR. Still has fevers despite being on IV antibiotics but that could be also secondary to lymphoma Already ruled out negative once but due to persistent fever there is a concern that that might be false negative and therefore a repeat test has been ordered. Oxygenation has remained stable since admission. We will continue with current isolation. Currently no change in therapy recommended for now.  3.  Stage IV Hodgkin's lymphoma. Pancytopenia S/P 1 cycle in 11/14/2018. No further chemotherapy after that  due to developing of severe sepsis last admission requiring a prolonged stay on discharge on 12/13/2018. Discussed with patient's primary oncologist Dr. Talitha Givens.  Patient's current pancytopenia is most likely secondary to inadequate treatment for Hodgkin's lymphoma. Received Granix during last admission for her neutropenia. Recommend supportive measures for now. Patient did not follow-up to oncology after recent discharge. Likely secondary to financial issues as he does not have any insurance. Recommend urgent outpatient follow-up in the clinic once she is either ruled out for COVID or has completed her isolation.  Patient should follow-up with her oncologist of choice which would be [name] in [place]. Transfuse for hemoglobin less than 7.  Please use irradiated blood products only.  4.  Hypothyroidism. Continue Synthroid 50 mcg.  5.  Upper extremity bilateral redness. Patient mentions that she gets this redness every time she gets antibiotics. She mentions that this was not present yesterday and came up this morning when she woke up. This is burning as well as itching. No petechiae confluent plaque-like redness. Had a similar presentation last admission.,  Work-up including complement levels, HIV, ANCA, hepatitis, rheumatoid factor were all negative. We will discontinue IV ceftriaxone and erythromycin and consider that as an allergic reaction for now and transition to Levaquin. Also add Benadryl and Pepcid for now.  6.Recent C. difficile colitis. Patient was treated with IV antibiotics last admission and developed diarrhea and was positive for C. difficile colitis. Treated with oral vancomycin. Currently there is a risk for giving her IV Antibiotics but given her allergic reaction no other good option for now.    7.  Severe protein calorie malnutrition. Continue nutrition. Most likely secondary to Hodgkin's lymphoma. Palliative  care visit last admission.  Diet: Regular diet DVT  Prophylaxis: subcutaneous Heparin  Advance goals of care discussion: full code  Family Communication: no family was present at bedside, at the time of interview.   Date: 01/05/2019  12:47 PM  Patient Isolation: Droplet+Contact HCP PPE: Wearing all recommended PPE PAPR + Gown + Gloves + Surgical Cap Patient PPE: None   Disposition:  Discharge to home in 1 to 2 days.  Consultants: none phone discussion with oncology Procedures: none  Scheduled Meds: . chlorpheniramine-HYDROcodone  5 mL Oral Q12H  . diphenhydrAMINE  25 mg Oral BID  . famotidine  20 mg Oral BID  . [START ON 01/06/2019] levothyroxine  50 mcg Oral Q0600  . magnesium oxide  400 mg Oral BID  . multivitamin with minerals  1 tablet Oral Daily  . vancomycin variable dose per unstable renal function (pharmacist dosing)   Does not apply See admin instructions   Continuous Infusions: . sodium chloride 100 mL/hr at 01/04/19 1806  . meropenem (MERREM) IV 2 g (01/05/19 0915)   PRN Meds: acetaminophen, ondansetron (ZOFRAN) IV, oxyCODONE, sodium chloride flush Antibiotics: Anti-infectives (From admission, onward)   Start     Dose/Rate Route Frequency Ordered Stop   01/04/19 2300  meropenem (MERREM) 2 g in sodium chloride 0.9 % 100 mL IVPB     2 g 200 mL/hr over 30 Minutes Intravenous Every 12 hours 01/04/19 1458     01/04/19 2100  vancomycin (VANCOCIN) IVPB 750 mg/150 ml premix  Status:  Discontinued     750 mg 150 mL/hr over 60 Minutes Intravenous Every 12 hours 01/04/19 0809 01/04/19 1511   01/04/19 1512  vancomycin variable dose per unstable renal function (pharmacist dosing)      Does not apply See admin instructions 01/04/19 1512     01/04/19 0900  meropenem (MERREM) 1 g in sodium chloride 0.9 % 100 mL IVPB  Status:  Discontinued     1 g 200 mL/hr over 30 Minutes Intravenous Every 8 hours 01/04/19 0809 01/04/19 1458   01/04/19 0830  vancomycin (VANCOCIN) 1,250 mg in sodium chloride 0.9 % 250 mL IVPB     1,250 mg 166.7  mL/hr over 90 Minutes Intravenous  Once 01/04/19 0809 01/04/19 1203   01/03/19 1015  fluconazole (DIFLUCAN) tablet 200 mg     200 mg Oral  Once 01/03/19 1014 01/03/19 1108   01/02/19 1200  levofloxacin (LEVAQUIN) IVPB 750 mg  Status:  Discontinued     750 mg 100 mL/hr over 90 Minutes Intravenous Every 24 hours 01/02/19 1008 01/04/19 0809   01/02/19 1000  doxycycline (VIBRA-TABS) tablet 100 mg  Status:  Discontinued     100 mg Oral Every 12 hours 01/02/19 0956 01/02/19 1001   01/01/19 1200  cefTRIAXone (ROCEPHIN) 1 g in sodium chloride 0.9 % 100 mL IVPB  Status:  Discontinued     1 g 200 mL/hr over 30 Minutes Intravenous Every 24 hours 01/01/19 0239 01/02/19 0956   01/01/19 1200  azithromycin (ZITHROMAX) 500 mg in sodium chloride 0.9 % 250 mL IVPB  Status:  Discontinued     500 mg 250 mL/hr over 60 Minutes Intravenous Every 24 hours 01/01/19 0239 01/02/19 0956       Objective: Physical Exam: Vitals:   01/04/19 1533 01/04/19 1700 01/04/19 1929 01/05/19 0923  BP: (!) 115/59  105/62 113/64  Pulse: (!) 114  (!) 102 (!) 119  Resp: 20  20 19   Temp: (!) 103.2 F (39.6 C) 98.8 F (  37.1 C) 99.9 F (37.7 C) 100.2 F (37.9 C)  TempSrc: Oral Oral Oral Oral  SpO2: 95%  98% 97%  Weight:      Height:        Intake/Output Summary (Last 24 hours) at 01/05/2019 1246 Last data filed at 01/04/2019 1806 Gross per 24 hour  Intake 1175.02 ml  Output -  Net 1175.02 ml   Filed Weights   01/01/19 0639 01/03/19 1400 01/04/19 0436  Weight: 54.8 kg 54.5 kg 58.6 kg   General: Alert, Awake and Oriented to Time, Place and Person. Appear in moderate distress, affect appropriate Eyes: PERRL, Conjunctiva normal ENT: Oral Mucosa clear moist. Neck: no JVD, no Abnormal Mass Or lumps Cardiovascular: S1 and S2 Present, no Murmur, Peripheral Pulses Present Respiratory: normal respiratory effort, Bilateral Air entry equal and Decreased, no use of accessory muscle, Clear to Auscultation, no Crackles, no wheezes  Abdomen: Bowel Sound present, Soft and no tenderness, no hernia Skin: no upper extremity redness, no Rash, no induration Extremities: no Pedal edema, no calf tenderness Neurologic: Grossly no focal neuro deficit. Bilaterally Equal motor strength  Data Reviewed: CBC: Recent Labs  Lab 01/02/19 0805 01/03/19 0402 01/03/19 1657 01/04/19 0748 01/05/19 0540  WBC 4.3 3.9* 3.3* 21.6* 4.3  NEUTROABS 3.5 2.9  --  17.0* 3.2  HGB 7.2* 6.4* 7.4* 10.4* 7.8*  HCT 21.7* 19.7* 22.0* 29.9* 24.3*  MCV 91.2 90.8 88.0 89.8 90.0  PLT PLATELET CLUMPS NOTED ON SMEAR, UNABLE TO ESTIMATE 61* 55* 156 56*   Basic Metabolic Panel: Recent Labs  Lab 01/01/19 0454 01/02/19 0805 01/03/19 0402 01/04/19 0748 01/05/19 0540  NA 130* 133* 130* 135 134*  K 3.4* 3.9 3.7 3.4* 3.5  CL 98 98 97* 96* 98  CO2 23 27 26 25 23   GLUCOSE 85 97 100* 213* 62*  BUN 13 13 11 19 12   CREATININE 0.61 0.68 0.72 1.47* 0.69  CALCIUM 7.3* 7.5* 7.6* 8.1* 7.1*  MG  --  1.7 1.7  --  1.7    Liver Function Tests: Recent Labs  Lab 01/01/19 0454 01/02/19 0805 01/03/19 0402 01/04/19 0748 01/05/19 0540  AST 99* 98* 152* 14* 219*  ALT 45* 40 48* 12 57*  ALKPHOS 285* 450* 513* 153* 379*  BILITOT 1.4* 1.3* 1.6* 0.6 2.5*  PROT 5.6* 5.3* 5.3* 5.6* 5.3*  ALBUMIN 1.4* 1.4* 1.3* 2.3* 1.2*   No results for input(s): LIPASE, AMYLASE in the last 168 hours. No results for input(s): AMMONIA in the last 168 hours. Coagulation Profile: No results for input(s): INR, PROTIME in the last 168 hours. Cardiac Enzymes: No results for input(s): CKTOTAL, CKMB, CKMBINDEX, TROPONINI in the last 168 hours. BNP (last 3 results) No results for input(s): PROBNP in the last 8760 hours. CBG: No results for input(s): GLUCAP in the last 168 hours. Studies: No results found.   Time spent: 35 minutes  Author: Berle Mull, MD [facility] 01/05/2019 12:46 PM  To reach On-call, see care teams to locate the attending and reach out to them via  [email or web address]. If 7PM-7AM, please contact night-coverage If you still have difficulty reaching the attending provider, please page the [facility] (Director on Call) for [facility] on amion for assistance.

## 2019-01-05 NOTE — Progress Notes (Signed)
Pt stated that she would like to go home and speak to the doctor. RN paged MD awaiting page back

## 2019-01-06 ENCOUNTER — Inpatient Hospital Stay (HOSPITAL_COMMUNITY): Payer: Medicaid Other

## 2019-01-06 LAB — CBC WITH DIFFERENTIAL/PLATELET
Abs Immature Granulocytes: 0.11 10*3/uL — ABNORMAL HIGH (ref 0.00–0.07)
Basophils Absolute: 0 10*3/uL (ref 0.0–0.1)
Basophils Relative: 0 %
Eosinophils Absolute: 0 10*3/uL (ref 0.0–0.5)
Eosinophils Relative: 0 %
HCT: 22.7 % — ABNORMAL LOW (ref 36.0–46.0)
Hemoglobin: 7.7 g/dL — ABNORMAL LOW (ref 12.0–15.0)
Immature Granulocytes: 4 %
Lymphocytes Relative: 13 %
Lymphs Abs: 0.4 10*3/uL — ABNORMAL LOW (ref 0.7–4.0)
MCH: 30.3 pg (ref 26.0–34.0)
MCHC: 33.9 g/dL (ref 30.0–36.0)
MCV: 89.4 fL (ref 80.0–100.0)
Monocytes Absolute: 0.2 10*3/uL (ref 0.1–1.0)
Monocytes Relative: 7 %
Neutro Abs: 2.1 10*3/uL (ref 1.7–7.7)
Neutrophils Relative %: 76 %
Platelets: 29 10*3/uL — CL (ref 150–400)
RBC: 2.54 MIL/uL — ABNORMAL LOW (ref 3.87–5.11)
RDW: 17.9 % — ABNORMAL HIGH (ref 11.5–15.5)
WBC: 2.7 10*3/uL — ABNORMAL LOW (ref 4.0–10.5)
nRBC: 0 % (ref 0.0–0.2)

## 2019-01-06 LAB — T4, FREE: Free T4: 0.72 ng/dL — ABNORMAL LOW (ref 0.82–1.77)

## 2019-01-06 LAB — NOVEL CORONAVIRUS, NAA (HOSP ORDER, SEND-OUT TO REF LAB; TAT 18-24 HRS): SARS-CoV-2, NAA: NOT DETECTED

## 2019-01-06 LAB — COMPREHENSIVE METABOLIC PANEL
ALT: 43 U/L (ref 0–44)
AST: 170 U/L — ABNORMAL HIGH (ref 15–41)
Albumin: 1.1 g/dL — ABNORMAL LOW (ref 3.5–5.0)
Alkaline Phosphatase: 309 U/L — ABNORMAL HIGH (ref 38–126)
Anion gap: 9 (ref 5–15)
BUN: 12 mg/dL (ref 6–20)
CO2: 23 mmol/L (ref 22–32)
Calcium: 7.1 mg/dL — ABNORMAL LOW (ref 8.9–10.3)
Chloride: 102 mmol/L (ref 98–111)
Creatinine, Ser: 0.54 mg/dL (ref 0.44–1.00)
GFR calc Af Amer: >60 mL/min (ref >60)
GFR calc non Af Amer: >60 mL/min (ref >60)
Glucose, Bld: 70 mg/dL (ref 70–99)
Potassium: 3.5 mmol/L (ref 3.5–5.1)
Sodium: 134 mmol/L — ABNORMAL LOW (ref 135–145)
Total Bilirubin: 3.1 mg/dL — ABNORMAL HIGH (ref 0.3–1.2)
Total Protein: 5 g/dL — ABNORMAL LOW (ref 6.5–8.1)

## 2019-01-06 LAB — CULTURE, BLOOD (ROUTINE X 2)
Culture: NO GROWTH
Culture: NO GROWTH
Special Requests: ADEQUATE
Special Requests: ADEQUATE

## 2019-01-06 LAB — MAGNESIUM: Magnesium: 1.9 mg/dL (ref 1.7–2.4)

## 2019-01-06 LAB — TSH: TSH: 0.521 u[IU]/mL (ref 0.350–4.500)

## 2019-01-06 MED ORDER — MIRTAZAPINE 15 MG PO TABS
7.5000 mg | ORAL_TABLET | Freq: Every day | ORAL | Status: DC
  Administered 2019-01-06 – 2019-01-07 (×2): 7.5 mg via ORAL
  Filled 2019-01-06 (×2): qty 1

## 2019-01-06 MED ORDER — VANCOMYCIN HCL IN DEXTROSE 1-5 GM/200ML-% IV SOLN
1000.0000 mg | INTRAVENOUS | Status: DC
  Filled 2019-01-06: qty 200

## 2019-01-06 MED ORDER — IOHEXOL 350 MG/ML SOLN
80.0000 mL | Freq: Once | INTRAVENOUS | Status: AC | PRN
  Administered 2019-01-06: 80 mL via INTRAVENOUS

## 2019-01-06 MED ORDER — ENSURE ENLIVE PO LIQD
237.0000 mL | Freq: Two times a day (BID) | ORAL | Status: DC
  Administered 2019-01-06 – 2019-01-07 (×2): 237 mL via ORAL

## 2019-01-06 MED ORDER — PRO-STAT SUGAR FREE PO LIQD
30.0000 mL | Freq: Two times a day (BID) | ORAL | Status: DC
  Administered 2019-01-06 – 2019-01-07 (×3): 30 mL via ORAL
  Filled 2019-01-06 (×5): qty 30

## 2019-01-06 MED ORDER — SODIUM CHLORIDE 0.9 % IV SOLN
INTRAVENOUS | Status: DC
  Administered 2019-01-06 – 2019-01-08 (×4): via INTRAVENOUS

## 2019-01-06 NOTE — Evaluation (Signed)
Physical Therapy Evaluation Patient Details Name: Cindy Ward MRN: [id number] DOB: 1966/04/22 Today's Date: 01/06/2019   History of Present Illness  53 y.o. female admitted on 01/01/19 for generalized weakness.  Ds with HCAP (r/o COVID x2 negative tests).  Pt with significant PMH of stage IV Hodgkin's lymphoma, pancytopenia, severe protein/calorie malnutrition, and HTN.  Clinical Impression  Pt is very weak and deconditioned, only able to tolerate OOB to [facility] and back to bed.  I spoke at length to the pt's daughter, Cindy Ward, who stated she could walk short distances to the car (~30') with min assist/close supervision and the RW.  I told Cindy Ward that I thought her mom was likely weaker now and she may even need a WC for household mobility as I think she is too weak and tired for even short distance gait at this time.  PT to come back in AM and attempt short distance gait with RW to see if she is able to walk or would be safer mobilizing in a WC at this time.  She would likely be best suited by SNF for rehab (or, honestly hospice SNF), but she needs to go to an OP oncology appointment this Wed.  I would recommend max HH services that she can get: PT/OT, aide, if not full palliative/hospice services (Dr. Posey Pronto says he has contacted palliative care for consult).  Please call daughter, Cindy Ward after therapy tomorrow for an update.  She is worried about the expense of a WC.   PT to follow acutely for deficits listed below.      Follow Up Recommendations SNF;Supervision/Assistance - 24 hour    Equipment Recommendations  Wheelchair (measurements PT);Wheelchair cushion (measurements PT);Other (comment)(standard)    Recommendations for Other Services OT consult     Precautions / Restrictions Precautions Precautions: Fall Precaution Comments: monitor HR      Mobility  Bed Mobility Overal bed mobility: Needs Assistance Bed Mobility: Supine to Sit;Sit to Supine     Supine to sit: Min assist Sit to  supine: Min assist   General bed mobility comments: Min assist to come to sitting EOB.  Pt had difficulty processing and sequencing through the task at hand.   Transfers Overall transfer level: Needs assistance Equipment used: 1 person hand held assist Transfers: Sit to/from [facility] Sit to Stand: Mod assist Stand pivot transfers: Mod assist       General transfer comment: Mod assist to stand and pivot to the [facility] and back to the bed, pt able to preform pericare with PT holding her up for balance.  Pt too fatigued to attempt gait this PM.           Balance Overall balance assessment: Needs assistance Sitting-balance support: Feet supported;Bilateral upper extremity supported Sitting balance-Leahy Scale: Fair Sitting balance - Comments: bil UE supported, flexed posture, supervision EOb.    Standing balance support: Bilateral upper extremity supported Standing balance-Leahy Scale: Poor Standing balance comment: needs exernal support in standing.                              Pertinent Vitals/Pain Pain Assessment: Faces Faces Pain Scale: Hurts little more Pain Location: generalized Pain Descriptors / Indicators: Guarding;Grimacing Pain Intervention(s): Limited activity within patient's tolerance;Monitored during session;Repositioned    Home Living Family/patient expects to be discharged to:: Private residence Living Arrangements: Children(daughter) Available Help at Discharge: Family(daughter works (owns 2 businesses)) Type of Home: [employer] Access: Stairs to enter [employer]  Stairs-Rails: Right Entrance Stairs-Number of Steps: 3 Home Layout: One level Home Equipment: Walker - 2 wheels;Shower seat      Prior Function Level of Independence: Needs assistance   Gait / Transfers Assistance Needed: per daughter, pt was able to walk short distances with RW (house to car) with close supervision, at times min steadying assist.  She had falls at her  home and one at her daughter's home when she was "doing things by herself she was not supposed to do"  ADL's / Homemaking Assistance Needed: pt was able to sponge off in downstairs bathroom  Comments: Per daughter, she was not eating much at home, she was not drinking much, she was very sedintary (was working with home therapy, but that was it, not active when they were not there).         Extremity/Trunk Assessment   Upper Extremity Assessment Upper Extremity Assessment: Generalized weakness    Lower Extremity Assessment Lower Extremity Assessment: Generalized weakness(visible muscle wasting)    Cervical / Trunk Assessment Cervical / Trunk Assessment: Kyphotic(weak trunk, flexed over in sitting EOB.  )  Communication   Communication: Other (comment)(low tone)  Cognition Arousal/Alertness: Lethargic Behavior During Therapy: Flat affect Overall Cognitive Status: Impaired/Different from baseline Area of Impairment: Orientation;Memory;Attention;Following commands;Safety/judgement;Awareness;Problem solving                 Orientation Level: Time Current Attention Level: Focused Memory: Decreased short-term memory Following Commands: Follows one step commands inconsistently;Follows one step commands with increased time Safety/Judgement: Decreased awareness of safety;Decreased awareness of deficits Awareness: Intellectual Problem Solving: Slow processing;Decreased initiation;Difficulty sequencing;Requires verbal cues;Requires tactile cues General Comments: Pt weak, difficult to sustain attention for task at hand, seems to lose attention before answering PTs questions, significant repetition needed, fuzzy on the details, so had to speak with daughter to confirm home set up.       General Comments General comments (skin integrity, edema, etc.): HR 129-149 during short burst of very little activity.  O2 sats 100% on RA.         Assessment/Plan    PT Assessment Patient needs  continued PT services  PT Problem List Decreased strength;Decreased activity tolerance;Decreased mobility;Decreased balance;Decreased cognition;Decreased knowledge of use of DME;Decreased safety awareness;Decreased knowledge of precautions;Pain       PT Treatment Interventions DME instruction;Gait training;Stair training;Functional mobility training;Therapeutic activities;Therapeutic exercise;Balance training;Patient/family education    PT Goals (Current goals can be found in the Care Plan section)  Acute Rehab PT Goals Patient Stated Goal: none stated, daughter wants her better, but is realiztic that this may be near the end. PT Goal Formulation: With family Time For Goal Achievement: 01/20/19 Potential to Achieve Goals: Fair    Frequency Min 3X/week           AM-PAC PT "6 Clicks" Mobility  Outcome Measure Help needed turning from your back to your side while in a flat bed without using bedrails?: A Little Help needed moving from lying on your back to sitting on the side of a flat bed without using bedrails?: A Little Help needed moving to and from a bed to a chair (including a wheelchair)?: A Lot Help needed standing up from a chair using your arms (e.g., wheelchair or bedside chair)?: A Lot Help needed to walk in hospital room?: A Lot Help needed climbing 3-5 steps with a railing? : Total 6 Click Score: 13    End of Session   Activity Tolerance: Patient limited by fatigue;Patient limited by lethargy Patient left:  in bed;with call bell/phone within reach;with bed alarm set Nurse Communication: Mobility status PT Visit Diagnosis: Muscle weakness (generalized) (M62.81);Difficulty in walking, not elsewhere classified (R26.2)    Time: 6160-7371 PT Time Calculation (min) (ACUTE ONLY): 53 min   Charges:      Wells Guiles B. Sandee Bernath, PT, DPT  Acute Rehabilitation #[phone] pager #[phone] office    PT Evaluation $PT Eval Moderate Complexity: 1 Mod PT  Treatments $Therapeutic Activity: 38-52 mins       01/06/2019, 6:28 PM

## 2019-01-06 NOTE — Progress Notes (Signed)
[facility] Progress Note  Patient: Cindy Ward HGD:[id number]   PCP: [name], MD DOB: [birth year]   DOA: 01/01/2019   DOS: 01/06/2019   Date of Service: the patient was seen and examined on 01/06/2019  Brief hospital course: Pt. with PMH of HTN, stage IV Hodgkin's lymphoma S/P 1 cycle of chemotherapy and 11/14/2018, recent admission for E. coli and group B strep bacteremia; admitted on 01/01/2019, presented with complaint of generalized weakness and fever and cough, was found to have healthcare associated pneumonia. Currently further plan is continue IV antibiotics.  Subjective: Denies any acute complaint no nausea no vomiting.  No diarrhea no constipation.  No abdominal pain.  No chest pain. Continues to have fever.  Reportedly per RN patient's oxygenation drops to 80s when she is standing up or doing any activity.  Her heart rate also goes faster.  Assessment and Plan: 1.  Healthcare associated pneumonia. Presents with cough and shortness of breath.  Chest x-ray shows no consolidation.  CT chest in outside facility shows patchy tree-in-bud opacities in the left lower lobe. Initially was hypoxic now on room air. No leukocytosis but actually developing pancytopenia. Initially treated with IV ceftriaxone and azithromycin. changed to IV Levaquin given her allergic reaction. Due to persistent fever patient was given vancomycin and meropenem to broaden her coverage. Repeat blood culture is negative for 48 hours. CT of the chest was performed which does not show any evidence of pneumonia. At this point I am discontinuing antibiotics and regardless the patient has received 5 days of appropriate antibiotics without any growth anywhere. Patient's fever is primarily coming from her lymphoma.  Discussed with infectious disease and would not recommend any further antibiotics unless there is a source or patient has new symptoms.  2.  COVID-19 test ruled out x2 With cough and shortness of  breath as well as fever with imaging findings patient was tested for COVID 19 Still has fevers despite being on IV antibiotics Heart test test came back negative twice. CT NGO chest does not show any evidence of groundglass opacities or any findings suspicious for COVID. I do think that this patient is at very low risk for active COVID and therefore will not retest her again.  3.  Stage IV Hodgkin's lymphoma. Pancytopenia S/P 1 cycle in 11/14/2018. No further chemotherapy after that due to developing of severe sepsis last admission requiring a prolonged stay on discharge on 12/13/2018. Discussed with patient's prior oncologist Dr. Talitha Givens.  Also discussed with patient's current oncologist [name] at [place]. Patient's current pancytopenia is most likely secondary to inadequate treatment for Hodgkin's lymphoma. Received Granix during last admission for her neutropenia.  Recommend supportive measures for now. Patient had one follow-up with oncology at [facility] at which time her labs were stable. On my discussion with [name] the patient will need to follow-up with her in the clinic, follow-up is arranged on Wednesday, 01/08/2019 at 11 AM.  She needs to be there 45 minutes before so that she can get her labs done.  (Please fax the discharge summary to her office, [phone].  Family is on 3 she does not have access to epic)  Will transfuse for hemoglobin less than 7.  Please use irradiated blood products only.  At present I do feel that her lymphoma is advancing and she does not have any good performance status poor nutritional status to undergo chemotherapy session. I have discussed goals of care with her and she would like to  go for chemotherapy as long as she can tolerate it and she would like to remain full code for now. I have discussed with her daughter, [name] with whom she is living currently.  Patient is not eating, drinking adequately at home as well, is not  taking her medication regularly at home as well. Patient mostly spends her time in the bed Patient has minimal oral fluid intake at home.  I have currently recommended supportive care at home for fever. I will add Remeron for possible depression-anxiety associated with her cancer and to improve her appetite.  4.  Hypothyroidism. Continue Synthroid 50 mcg. Dose decreased in the setting of progressive weight loss as well as sinus tachycardia.  5.  Upper extremity bilateral redness. Resolved  Patient mentions that she gets this redness every time she gets antibiotics. She mentions that this was not present yesterday and came up this morning when she woke up. burning as well as itching. No petechiae confluent plaque-like . Had a similar presentation last admission.,  Work-up including complement levels, HIV, ANCA, hepatitis, rheumatoid factor were all negative. We will discontinue IV ceftriaxone and erythromycin and consider that as an allergic reaction for now and transition to Levaquin. Also add Benadryl and Pepcid for now.  6.Recent C. difficile colitis. Patient was treated with IV antibiotics last admission and developed diarrhea and was positive for C. difficile colitis. Treated with oral vancomycin.  7.  Severe protein calorie malnutrition. Continue nutrition. Most likely secondary to Hodgkin's lymphoma. Palliative care visit last admission.  8.  Sinus tachycardia. Likely from dehydration. We will provide IV fluids. Low Dose Lopressor.  9.  Acute small peripheral PE. Discussed with oncologist [name], given her severe thrombocytopenia patient is not a candidate for any anticoagulation right now. Patient will get a repeat blood work in her office at which time she will decide on anticoagulation.  Diet: Regular diet DVT Prophylaxis: subcutaneous Heparin  Advance goals of care discussion: full code  Family Communication: no family was present at bedside, at the time of  interview.   Date: 01/06/2019  5:49 PM  Patient Isolation: Droplet+Contact HCP PPE: Wearing all recommended PPE PAPR + Gown + Gloves + Surgical Cap Patient PPE: None   Disposition:  Discharge to home tomorrow after IV hydration as long as her heart rate is better and family can take her home.  So that they can follow-up with oncologist.  Consultants: none phone discussion with oncology Procedures: none  Scheduled Meds:  feeding supplement (ENSURE ENLIVE)  237 mL Oral BID BM   feeding supplement (PRO-STAT SUGAR FREE 64)  30 mL Oral BID   levothyroxine  50 mcg Oral Q0600   magnesium oxide  400 mg Oral BID   metoprolol tartrate  12.5 mg Oral BID   mirtazapine  7.5 mg Oral QHS   multivitamin with minerals  1 tablet Oral Daily   Continuous Infusions:  sodium chloride     PRN Meds: acetaminophen, ondansetron (ZOFRAN) IV, oxyCODONE, sodium chloride flush Antibiotics: Anti-infectives (From admission, onward)   Start     Dose/Rate Route Frequency Ordered Stop   01/06/19 1500  vancomycin (VANCOCIN) IVPB 1000 mg/200 mL premix  Status:  Discontinued     1,000 mg 200 mL/hr over 60 Minutes Intravenous Every 24 hours 01/06/19 0817 01/06/19 1047   01/05/19 1700  meropenem (MERREM) 2 g in sodium chloride 0.9 % 100 mL IVPB  Status:  Discontinued     2 g 200 mL/hr over 30 Minutes Intravenous Every  8 hours 01/05/19 1406 01/06/19 1047   01/05/19 1500  vancomycin (VANCOCIN) 1,000 mg in sodium chloride 0.9 % 250 mL IVPB  Status:  Discontinued     1,000 mg 250 mL/hr over 60 Minutes Intravenous Every 24 hours 01/05/19 1353 01/06/19 0814   01/04/19 2300  meropenem (MERREM) 2 g in sodium chloride 0.9 % 100 mL IVPB  Status:  Discontinued     2 g 200 mL/hr over 30 Minutes Intravenous Every 12 hours 01/04/19 1458 01/05/19 1406   01/04/19 2100  vancomycin (VANCOCIN) IVPB 750 mg/150 ml premix  Status:  Discontinued     750 mg 150 mL/hr over 60 Minutes Intravenous Every 12 hours 01/04/19 0809  01/04/19 1511   01/04/19 1512  vancomycin variable dose per unstable renal function (pharmacist dosing)  Status:  Discontinued      Does not apply See admin instructions 01/04/19 1512 01/06/19 1047   01/04/19 0900  meropenem (MERREM) 1 g in sodium chloride 0.9 % 100 mL IVPB  Status:  Discontinued     1 g 200 mL/hr over 30 Minutes Intravenous Every 8 hours 01/04/19 0809 01/04/19 1458   01/04/19 0830  vancomycin (VANCOCIN) 1,250 mg in sodium chloride 0.9 % 250 mL IVPB     1,250 mg 166.7 mL/hr over 90 Minutes Intravenous  Once 01/04/19 0809 01/04/19 1203   01/03/19 1015  fluconazole (DIFLUCAN) tablet 200 mg     200 mg Oral  Once 01/03/19 1014 01/03/19 1108   01/02/19 1200  levofloxacin (LEVAQUIN) IVPB 750 mg  Status:  Discontinued     750 mg 100 mL/hr over 90 Minutes Intravenous Every 24 hours 01/02/19 1008 01/04/19 0809   01/02/19 1000  doxycycline (VIBRA-TABS) tablet 100 mg  Status:  Discontinued     100 mg Oral Every 12 hours 01/02/19 0956 01/02/19 1001   01/01/19 1200  cefTRIAXone (ROCEPHIN) 1 g in sodium chloride 0.9 % 100 mL IVPB  Status:  Discontinued     1 g 200 mL/hr over 30 Minutes Intravenous Every 24 hours 01/01/19 0239 01/02/19 0956   01/01/19 1200  azithromycin (ZITHROMAX) 500 mg in sodium chloride 0.9 % 250 mL IVPB  Status:  Discontinued     500 mg 250 mL/hr over 60 Minutes Intravenous Every 24 hours 01/01/19 0239 01/02/19 0956       Objective: Physical Exam: Vitals:   01/05/19 2235 01/06/19 0035 01/06/19 0831 01/06/19 1500  BP: 117/74  113/69 115/75  Pulse: (!) 118  (!) 116 (!) 110  Resp: 16     Temp: (!) 101.1 F (38.4 C) 98.1 F (36.7 C) 98.7 F (37.1 C) 98.5 F (36.9 C)  TempSrc: Oral Oral Oral Oral  SpO2: 98%  93% 95%  Weight:      Height:        Intake/Output Summary (Last 24 hours) at 01/06/2019 1749 Last data filed at 01/06/2019 1112 Gross per 24 hour  Intake 2122.75 ml  Output --  Net 2122.75 ml   Filed Weights   01/01/19 0639 01/03/19 1400  01/04/19 0436  Weight: 54.8 kg 54.5 kg 58.6 kg   General: Alert, Awake and Oriented to Time, Place and Person. Appear in moderate distress, affect appropriate Eyes: PERRL, Conjunctiva normal ENT: Oral Mucosa clear moist. Neck: no JVD, no Abnormal Mass Or lumps Cardiovascular: S1 and S2 Present, no Murmur, Peripheral Pulses Present Respiratory: normal respiratory effort, Bilateral Air entry equal and Decreased, no use of accessory muscle, Clear to Auscultation, no Crackles, no wheezes Abdomen: Bowel  Sound present, Soft and no tenderness, no hernia Skin: no upper extremity redness, no Rash, no induration Extremities: no Pedal edema, no calf tenderness Neurologic: Grossly no focal neuro deficit. Bilaterally Equal motor strength  Data Reviewed: CBC: Recent Labs  Lab 01/02/19 0805 01/03/19 0402 01/03/19 1657 01/04/19 0748 01/05/19 0540 01/06/19 0635  WBC 4.3 3.9* 3.3* 21.6* 4.3 2.7*  NEUTROABS 3.5 2.9  --  17.0* 3.2 2.1  HGB 7.2* 6.4* 7.4* 10.4* 7.8* 7.7*  HCT 21.7* 19.7* 22.0* 29.9* 24.3* 22.7*  MCV 91.2 90.8 88.0 89.8 90.0 89.4  PLT PLATELET CLUMPS NOTED ON SMEAR, UNABLE TO ESTIMATE 61* 55* 156 56* 29*   Basic Metabolic Panel: Recent Labs  Lab 01/02/19 0805 01/03/19 0402 01/04/19 0748 01/05/19 0540 01/06/19 0635  NA 133* 130* 135 134* 134*  K 3.9 3.7 3.4* 3.5 3.5  CL 98 97* 96* 98 102  CO2 27 26 25 23 23   GLUCOSE 97 100* 213* 62* 70  BUN 13 11 19 12 12   CREATININE 0.68 0.72 1.47* 0.69 0.54  CALCIUM 7.5* 7.6* 8.1* 7.1* 7.1*  MG 1.7 1.7  --  1.7 1.9    Liver Function Tests: Recent Labs  Lab 01/02/19 0805 01/03/19 0402 01/04/19 0748 01/05/19 0540 01/06/19 0635  AST 98* 152* 14* 219* 170*  ALT 40 48* 12 57* 43  ALKPHOS 450* 513* 153* 379* 309*  BILITOT 1.3* 1.6* 0.6 2.5* 3.1*  PROT 5.3* 5.3* 5.6* 5.3* 5.0*  ALBUMIN 1.4* 1.3* 2.3* 1.2* 1.1*   No results for input(s): LIPASE, AMYLASE in the last 168 hours. No results for input(s): AMMONIA in the last 168  hours. Coagulation Profile: No results for input(s): INR, PROTIME in the last 168 hours. Cardiac Enzymes: No results for input(s): CKTOTAL, CKMB, CKMBINDEX, TROPONINI in the last 168 hours. BNP (last 3 results) No results for input(s): PROBNP in the last 8760 hours. CBG: No results for input(s): GLUCAP in the last 168 hours. Studies: Ct Angio Chest Pe W Or Wo Contrast  Addendum Date: 01/06/2019   ADDENDUM REPORT: 01/06/2019 15:54 ADDENDUM: Critical Value/emergent results were called by telephone at the time of interpretation on 01/06/2019 at 3:54 pm to Dr. Berle Mull , who verbally acknowledged these results. Electronically Signed   By: Marin Olp M.D.   On: 01/06/2019 15:54   Result Date: 01/06/2019 CLINICAL DATA:  Shortness of breath. History of lymphoma. Low probability for COVID-19. Hypoxia on exertion. EXAM: CT ANGIOGRAPHY CHEST WITH CONTRAST TECHNIQUE: Multidetector CT imaging of the chest was performed using the standard protocol during bolus administration of intravenous contrast. Multiplanar CT image reconstructions and MIPs were obtained to evaluate the vascular anatomy. CONTRAST:  73mL OMNIPAQUE IOHEXOL 350 MG/ML SOLN COMPARISON:  11/18/2018 FINDINGS: Cardiovascular: Right-sided Port-A-Cath with tip over the cavoatrial junction. Borderline cardiomegaly. Small amount of pericardial fluid is present with interval improvement. Thoracic aorta is normal in caliber without evidence of aneurysm or dissection. Pulmonary arterial system is adequately opacified. Small peripheral emboli are seen over the more peripheral right middle lobar arteries. No other right-sided emboli are noted. No left-sided pulmonary emboli visualized. RV/LV ratio is 0.8. Remaining vascular structures are unremarkable. Mediastinum/Nodes: No mediastinal or hilar adenopathy. Mild prominence of the thyroid gland with subcentimeter hypodense nodule over the isthmus unchanged. Interval decrease superior mediastinal and  bilateral axillary adenopathy with surgical clips over the left axillary nodes compatible with known lymphoma. Lungs/Pleura: Lungs are adequately inflated and demonstrate small bilateral pleural effusions slightly improved. There is minimal associated bibasilar atelectasis improved. Airways  are normal. Upper Abdomen: Mild stable subjective splenomegaly. No acute findings. Musculoskeletal: Unremarkable. Review of the MIP images confirms the above findings. IMPRESSION: Small peripheral emboli over the right middle lobe pulmonary arteries. Small bilateral pleural effusions with associated bibasilar atelectasis slightly improved. Interval decrease in bilateral axillary adenopathy with surgical clips over the left axillary nodes compatible with known lymphoma. Borderline cardiomegaly with slight improvement of a small amount of pericardial fluid. Mild prominence of the thyroid with stable subcentimeter hypodense nodule over the isthmus. Suggestion mild splenomegaly unchanged. Electronically Signed: By: Marin Olp M.D. On: 01/06/2019 15:40     Time spent: 35 minutes  Author: Berle Mull, MD [facility] 01/06/2019 5:49 PM  To reach On-call, see care teams to locate the attending and reach out to them via [email or web address]. If 7PM-7AM, please contact night-coverage If you still have difficulty reaching the attending provider, please page the [place] [facility] (Director on Call) for [facility] on amion for assistance.

## 2019-01-06 NOTE — Progress Notes (Signed)
Lab called to inform RN of pt's critical value of plt 29. MD notified and no new orders received. Will continue to closely monitor pt. Delia Heady RN

## 2019-01-06 NOTE — Progress Notes (Signed)
NURSING PROGRESS NOTE  Zamyiah Tino [id number] Transfer Data: 01/06/2019 6:42 PM Attending Provider: Lavina Hamman, MD JOI:TGPQ, Gara Kroner, MD Code Status: FULL   Chesni Vos is a 53 y.o. female patient transferred from [address] [place]  -No acute distress noted.  -No complaints of shortness of breath.  -No complaints of chest pain.   Cardiac Monitoring: Box # 18 in place. Cardiac monitor yields:sinus tachycardia.  Last Documented Vital Signs: Blood pressure 109/66, pulse (!) 125, temperature 98.2 F (36.8 C), temperature source Oral, resp. rate 18, height 5\' 8"  (1.727 m), weight 57.9 kg, SpO2 94 %.  IV Fluids: IV Port cath subclavian right, condition patent and no redness normal saline.   Allergies:  Ceftriaxone  Past Medical History:   has a past medical history of Cancer ([place]), Hypertension, Pneumonia, and Thyroid disease.  Past Surgical History:   has a past surgical history that includes Axillary lymph node biopsy (Left, 11/12/2018) and IR IMAGING GUIDED PORT INSERTION (11/18/2018).  Social History:   reports that she has never smoked. She has never used smokeless tobacco. She reports that she does not drink alcohol or use drugs.  Skin: intact except where otherwise charted  Patient/Family orientated to room. Information packet given to patient/family. Admission inpatient armband information verified with patient/family to include name and date of birth and placed on patient arm. Side rails up x 2, fall assessment and education completed with patient/family. Patient/family able to verbalize understanding of risk associated with falls and verbalized understanding to call for assistance before getting out of bed. Call light within reach. Patient/family able to voice and demonstrate understanding of unit orientation instructions.

## 2019-01-06 NOTE — Progress Notes (Addendum)
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  A walker alone will not resolve the issues with performing activities of daily living. A wheelchair will allow patient to safely perform daily activities.  The patient can self propel in the home or has a caregiver who can provide assistance.     Barbarann Ehlers Nitika Jackowski, PT, DPT  Acute Rehabilitation [phone] pager [phone]) [phone] office

## 2019-01-06 NOTE — Progress Notes (Signed)
Pt has transfer order and and pt transferred to 5w14. Report called off to RN Sarah on 5w. Pt transported off unit via wheelchair with IV transfusing and belongings to the side. Delia Heady RN

## 2019-01-07 DIAGNOSIS — Z7189 Other specified counseling: Secondary | ICD-10-CM

## 2019-01-07 DIAGNOSIS — R5081 Fever presenting with conditions classified elsewhere: Secondary | ICD-10-CM

## 2019-01-07 DIAGNOSIS — D6481 Anemia due to antineoplastic chemotherapy: Secondary | ICD-10-CM

## 2019-01-07 DIAGNOSIS — T451X5A Adverse effect of antineoplastic and immunosuppressive drugs, initial encounter: Secondary | ICD-10-CM

## 2019-01-07 LAB — CBC WITH DIFFERENTIAL/PLATELET
Abs Immature Granulocytes: 0.16 10*3/uL — ABNORMAL HIGH (ref 0.00–0.07)
Basophils Absolute: 0 10*3/uL (ref 0.0–0.1)
Basophils Relative: 0 %
Eosinophils Absolute: 0 10*3/uL (ref 0.0–0.5)
Eosinophils Relative: 0 %
HCT: 22.1 % — ABNORMAL LOW (ref 36.0–46.0)
Hemoglobin: 7.3 g/dL — ABNORMAL LOW (ref 12.0–15.0)
Immature Granulocytes: 5 %
Lymphocytes Relative: 7 %
Lymphs Abs: 0.3 10*3/uL — ABNORMAL LOW (ref 0.7–4.0)
MCH: 30.2 pg (ref 26.0–34.0)
MCHC: 33 g/dL (ref 30.0–36.0)
MCV: 91.3 fL (ref 80.0–100.0)
Monocytes Absolute: 0.2 10*3/uL (ref 0.1–1.0)
Monocytes Relative: 5 %
Neutro Abs: 2.9 10*3/uL (ref 1.7–7.7)
Neutrophils Relative %: 83 %
Platelets: 22 10*3/uL — CL (ref 150–400)
RBC: 2.42 MIL/uL — ABNORMAL LOW (ref 3.87–5.11)
RDW: 18.1 % — ABNORMAL HIGH (ref 11.5–15.5)
WBC: 3.4 10*3/uL — ABNORMAL LOW (ref 4.0–10.5)
nRBC: 0 % (ref 0.0–0.2)

## 2019-01-07 LAB — BASIC METABOLIC PANEL
Anion gap: 11 (ref 5–15)
BUN: 16 mg/dL (ref 6–20)
CO2: 21 mmol/L — ABNORMAL LOW (ref 22–32)
Calcium: 6.7 mg/dL — ABNORMAL LOW (ref 8.9–10.3)
Chloride: 105 mmol/L (ref 98–111)
Creatinine, Ser: 0.72 mg/dL (ref 0.44–1.00)
GFR calc Af Amer: >60 mL/min (ref >60)
GFR calc non Af Amer: >60 mL/min (ref >60)
Glucose, Bld: 103 mg/dL — ABNORMAL HIGH (ref 70–99)
Potassium: 3.2 mmol/L — ABNORMAL LOW (ref 3.5–5.1)
Sodium: 137 mmol/L (ref 135–145)

## 2019-01-07 MED ORDER — TAMSULOSIN HCL 0.4 MG PO CAPS
0.4000 mg | ORAL_CAPSULE | Freq: Every day | ORAL | Status: DC
  Administered 2019-01-07: 17:00:00 0.4 mg via ORAL
  Filled 2019-01-07: qty 1

## 2019-01-07 MED ORDER — ORAL CARE MOUTH RINSE
15.0000 mL | Freq: Two times a day (BID) | OROMUCOSAL | Status: DC
  Administered 2019-01-07: 15 mL via OROMUCOSAL

## 2019-01-07 MED ORDER — SODIUM CHLORIDE 0.9 % IV SOLN
2.0000 g | Freq: Three times a day (TID) | INTRAVENOUS | Status: DC
  Administered 2019-01-07: 2 g via INTRAVENOUS
  Filled 2019-01-07 (×4): qty 2

## 2019-01-07 MED ORDER — METOPROLOL TARTRATE 25 MG PO TABS
25.0000 mg | ORAL_TABLET | Freq: Two times a day (BID) | ORAL | Status: DC
  Administered 2019-01-07: 22:00:00 25 mg via ORAL
  Filled 2019-01-07 (×2): qty 1

## 2019-01-07 MED ORDER — VANCOMYCIN HCL 1000 MG IV SOLR
1000.0000 mg | Freq: Two times a day (BID) | INTRAVENOUS | Status: DC
  Administered 2019-01-07: 1000 mg via INTRAVENOUS
  Filled 2019-01-07 (×3): qty 1000

## 2019-01-07 MED ORDER — POTASSIUM CHLORIDE CRYS ER 20 MEQ PO TBCR
40.0000 meq | EXTENDED_RELEASE_TABLET | Freq: Once | ORAL | Status: AC
  Administered 2019-01-07: 40 meq via ORAL
  Filled 2019-01-07: qty 2

## 2019-01-07 MED ORDER — IBUPROFEN 400 MG PO TABS
400.0000 mg | ORAL_TABLET | Freq: Four times a day (QID) | ORAL | Status: DC | PRN
  Administered 2019-01-07: 400 mg via ORAL
  Filled 2019-01-07: qty 1

## 2019-01-07 MED ORDER — METOPROLOL TARTRATE 12.5 MG HALF TABLET
12.5000 mg | ORAL_TABLET | Freq: Once | ORAL | Status: AC
  Administered 2019-01-07: 14:00:00 12.5 mg via ORAL
  Filled 2019-01-07: qty 1

## 2019-01-07 MED ORDER — MORPHINE SULFATE 10 MG/5ML PO SOLN: 5.0000 mg | ORAL | Status: DC | PRN

## 2019-01-07 NOTE — TOC Progression Note (Signed)
Transition of Care [facility]) - Progression Note    Patient Details  Name: Cindy Ward MRN: [id number] Date of Birth: [birth year]  Transition of Care [facility]) CM/SW [place], LCSW Phone Number: 01/07/2019, 1:58 PM  Clinical Narrative:    Patient and family are requesting to return home with Hospice services. CSW sent referral to [facility] for review. If accepted, they will deliver patient equipment later today in preparation for discharge tomorrow.    Expected Discharge Plan: Home/Self Care Barriers to Discharge: Inadequate or no insurance  Expected Discharge Plan and Services Expected Discharge Plan: Home/Self Care   Discharge Planning Services: CM Consult Post Acute Care Choice: NA Living arrangements for the past 2 months: Single Family Home                 DME Arranged: N/A DME Agency: NA HH Arranged: NA HH Agency: NA   Social Determinants of Health (SDOH) Interventions    Readmission Risk Interventions Readmission Risk Prevention Plan 01/01/2019  Transportation Screening Complete  PCP or Specialist Appt within 5-7 Days Complete  Home Care Screening Not Complete  Home Care Screening Not Completed Comments NA  Medication Review (RN CM) Complete

## 2019-01-07 NOTE — Progress Notes (Signed)
[facility] Referral received from [place] for hospice in the home. She has been approved for these services through [place]. Spoke to the daughter they are in agreement of services as well. I have ordered equipment to be deliver in home from [facility] ([facility]) of hospital bed, oxygen, BSC, over bed table, and wheelchair. Thank you for this referral. Webb Silversmith RN [phone]

## 2019-01-07 NOTE — Evaluation (Addendum)
Occupational Therapy Evaluation Patient Details Name: Cindy Ward MRN: [id number] DOB: Jul 04, 1966 Today's Date: 01/07/2019    History of Present Illness 53 y.o. female admitted on 01/01/19 for generalized weakness.  Ds with HCAP (r/o COVID x2 negative tests).  Pt with significant PMH of stage IV Hodgkin's lymphoma, pancytopenia, severe protein/calorie malnutrition, and HTN.   Clinical Impression   This 53 y/o female presents with the above. PLOF obtained via chart review and PT eval who spoke with pt's daughter. Pt was most recently requiring supervision/intermittent assist for short distance mobility using RW, receiving some assist for ADL completion and performing sponge baths, was mostly sedentary. Pt presenting with significant weakness and fatigue with minimal activity. Pt up on BSC upon arrival to room, requires min-modA (+2 safety) for stand pivot transfers; modA for seated UB ADL and max-totalA for LB ADL. Pt somewhat impulsive with mobility due to fatigue levels and only focusing on wanting to lie down, requires cues for safety and max encouragement to remain upright long enough to perform peri-care after toileting task prior to returning to bed. Pt is appropriate for SNF level care, however unsure of pt's prognosis given current medical status. Pt may be more appropriate to return home with family assist (if family able and willing to provide necessary care). Per chart Pt's daughter typically works during the day but due to current "stay in place" is able to stay home to provide increased assist. Will continue to follow acutely to progress pt's strength, safety and independence with ADL and mobility.     Follow Up Recommendations  SNF;Supervision/Assistance - 24 hour(unless family able to provide necessary assist)    Equipment Recommendations  3 in 1 bedside commode    Recommendations for Other Services Other (comment)(palliative consult)     Precautions / Restrictions  Precautions Precautions: Fall Precaution Comments: monitor HR Restrictions Weight Bearing Restrictions: No      Mobility Bed Mobility Overal bed mobility: Needs Assistance Bed Mobility: Sit to Supine Rolling: Modified independent (Device/Increase time) Sidelying to sit: Min assist   Sit to supine: Min guard   General bed mobility comments: for lines/safety, pt able to bring LEs onto EOB without assist  Transfers Overall transfer level: Needs assistance Equipment used: None;1 person hand held assist Transfers: Sit to/from [employer] Transfers Sit to Stand: +2 safety/equipment;Min assist;Mod assist Stand pivot transfers: Min assist;+2 physical assistance;+2 safety/equipment       General transfer comment: sit<>stand x2, from [facility] and again from EOB; pt wishing to return to bed from [facility] so less assist required to stand, required increased boosting assist from EOB to perform peri-care, use of HHA (+2 for safety) to take small pivotal steps towards EOB from [facility]; pt fatigues very easily and requires verbal cues for safety as pt attempting to sit prior to full transfer completion     Balance Overall balance assessment: Needs assistance Sitting-balance support: Feet supported;Bilateral upper extremity supported Sitting balance-Leahy Scale: Fair Sitting balance - Comments: bil UE supported, flexed posture, supervision EOB   Standing balance support: Bilateral upper extremity supported Standing balance-Leahy Scale: Poor Standing balance comment: needs exernal support in standing.                            ADL either performed or assessed with clinical judgement   ADL Overall ADL's : Needs assistance/impaired Eating/Feeding: Set up;Sitting Eating/Feeding Details (indicate cue type and reason): pt falling asleep end of session despite lunch being there, shook  her head "no" when asked if she wanted to eat, encouraged PO intake as noted very little eaten from breakfast  tray Grooming: Set up;Minimal assistance;Sitting   Upper Body Bathing: Minimal assistance;Moderate assistance;Sitting   Lower Body Bathing: Maximal assistance;+2 for safety/equipment;Sit to/from stand   Upper Body Dressing : Moderate assistance;Sitting   Lower Body Dressing: Maximal assistance;Total assistance;+2 for safety/equipment;Sit to/from stand   Toilet Transfer: Minimal assistance;+2 for physical assistance;+2 for safety/equipment;Stand-pivot;BSC   Toileting- Clothing Manipulation and Hygiene: Maximal assistance;Total assistance;+2 for physical assistance;+2 for safety/equipment;Sit to/from stand Toileting - Clothing Manipulation Details (indicate cue type and reason): assisted with standing balance while RN assisted with peri-care after using BSC, pt requires max encouragement to remain standing to perform peri-care as she fatigues quickly; minA overall for standing balance      Functional mobility during ADLs: Minimal assistance;+2 for physical assistance;+2 for safety/equipment(HHA, stand pivot transfer) General ADL Comments: pt with significant weakness and fatigue with any activity      Vision         Perception     Praxis      Pertinent Vitals/Pain Pain Assessment: Faces Faces Pain Scale: No hurt Pain Intervention(s): Monitored during session     Hand Dominance     Extremity/Trunk Assessment Upper Extremity Assessment Upper Extremity Assessment: Generalized weakness   Lower Extremity Assessment Lower Extremity Assessment: Defer to PT evaluation   Cervical / Trunk Assessment Cervical / Trunk Assessment: Kyphotic   Communication Communication Communication: Other (comment)(low tone)   Cognition Arousal/Alertness: Awake/alert Behavior During Therapy: Flat affect;Impulsive Overall Cognitive Status: Impaired/Different from baseline Area of Impairment: Orientation;Attention;Following commands;Problem solving;Safety/judgement                  Orientation Level: Disoriented to(unable to state birthdate) Current Attention Level: Focused Memory: Decreased short-term memory Following Commands: Follows one step commands inconsistently;Follows one step commands with increased time Safety/Judgement: Decreased awareness of safety;Decreased awareness of deficits Awareness: Intellectual Problem Solving: Slow processing;Requires verbal cues;Requires tactile cues General Comments: pt very weak/soft spoken, pt impulsive with certain mobility tasks due to fatigue and only wanting to lay down, requiring increased multimodal cues to follow commands; pt only intermittently responding to therapist's questions   General Comments  VSS    Exercises     Shoulder Instructions      Home Living Family/patient expects to be discharged to:: Private residence Living Arrangements: Children(daughter) Available Help at Discharge: Family(daughter works but is home right now due to stay at home) Type of Home: [place] to enter Technical brewer of Steps: 3 Entrance Stairs-Rails: Right [place]: One level     Bathroom Shower/Tub: Teacher, early years/pre: [place]: Environmental consultant - 2 wheels;Shower seat          Prior Functioning/Environment Level of Independence: Needs assistance  Gait / Transfers Assistance Needed: per daughter, pt was able to walk short distances with RW (house to car) with close supervision, at times min steadying assist.  She had falls at her home and one at her daughter's home when she was "doing things by herself she was not supposed to do" ADL's / Homemaking Assistance Needed: pt was able to sponge off in downstairs bathroom Communication / Swallowing Assistance Needed: confusion was present PTA after d/c from [facility]. Comments: PLOF obtained from PT eval; Per daughter, she was not eating much at home, she was not drinking much, she was very sedentary (was working with home  therapy, but that  was it, not active when they were not there).         OT Problem List: Decreased strength;Decreased activity tolerance;Impaired balance (sitting and/or standing);Decreased range of motion;Decreased cognition;Decreased safety awareness;Decreased knowledge of use of DME or AE      OT Treatment/Interventions: Self-care/ADL training;Therapeutic exercise;Neuromuscular education;Energy conservation;DME and/or AE instruction;Therapeutic activities;Cognitive remediation/compensation;Patient/family education;Balance training    OT Goals(Current goals can be found in the care plan section) Acute Rehab OT Goals Patient Stated Goal: none stated OT Goal Formulation: With patient Time For Goal Achievement: 01/21/19 Potential to Achieve Goals: Fair  OT Frequency: Min 2X/week   Barriers to D/C:            Co-evaluation              AM-PAC OT "6 Clicks" Daily Activity     Outcome Measure Help from another person eating meals?: A Lot Help from another person taking care of personal grooming?: A Lot Help from another person toileting, which includes using toliet, bedpan, or urinal?: A Lot Help from another person bathing (including washing, rinsing, drying)?: A Lot Help from another person to put on and taking off regular upper body clothing?: A Lot Help from another person to put on and taking off regular lower body clothing?: Total 6 Click Score: 11   End of Session Nurse Communication: Mobility status  Activity Tolerance: Patient limited by fatigue Patient left: in bed;with call bell/phone within reach;with bed alarm set  OT Visit Diagnosis: Muscle weakness (generalized) (M62.81);Unsteadiness on feet (R26.81)                Time: 2376-2831 OT Time Calculation (min): 11 min Charges:  OT General Charges $OT Visit: 1 Visit OT Evaluation $OT Eval Moderate Complexity: [place], OT [employer] Pager [phone] Office  [place] 01/07/2019, 12:10 PM

## 2019-01-07 NOTE — Progress Notes (Signed)
Pharmacy Antibiotic Note  Cindy Ward is a 53 y.o. female admitted on 01/01/2019 with pneumonia.  Pharmacy has been consulted for vancomycin dosing.  Plan: Vancomycin 1000mg  IV q12h Expected AUC 500 Scr used 0.54 Monitor for clinical course  Height: 5\' 8"  (172.7 cm) Weight: 127 lb 10.3 oz (57.9 kg) IBW/kg (Calculated) : 63.9  Temp (24hrs), Avg:99.2 F (37.3 C), Min:98.2 F (36.8 C), Max:102.1 F (38.9 C)  Recent Labs  Lab 01/02/19 0805 01/03/19 0402 01/03/19 1657 01/04/19 0748 01/05/19 0540 01/06/19 0635  WBC 4.3 3.9* 3.3* 21.6* 4.3 2.7*  CREATININE 0.68 0.72  --  1.47* 0.69 0.54    Estimated Creatinine Clearance: 74.3 mL/min (by C-G formula based on SCr of 0.54 mg/dL).    Allergies  Allergen Reactions  . Ceftriaxone Rash    Errythmatic rash to bilateral upper extremities and face; warm to touch    Antimicrobials this admission: Ceftriaxone 4/8>>4/9 Azithromycin 4/8>>4/9 Levofloxacin 4/9>>4/11 Vanc 4/11>>4/13, 4/14>>4/17 Merrem 4/11>>4/15  Cindy Ward A. Levada Dy, PharmD, [place]  Please utilize Amion for appropriate phone number to reach the unit pharmacist ([place])    01/07/2019 8:12 AM

## 2019-01-07 NOTE — Progress Notes (Signed)
Physical Therapy Treatment Patient Details Name: Cindy Ward MRN: [id number] DOB: 06-12-1966 Today's Date: 01/07/2019    History of Present Illness 53 y.o. female admitted on 01/01/19 for generalized weakness.  Ds with HCAP (r/o COVID x2 negative tests).  Pt with significant PMH of stage IV Hodgkin's lymphoma, pancytopenia, severe protein/calorie malnutrition, and HTN.    PT Comments    Pt was able to tolerate 20' of ambulation with max encouragement and frequent standing rest break. Pt very fatigued s/p amb. Spoke with daughter who reports she will be home 24/7 until she is cleared to go back to work when stay at home order is lifted. Pt would be safe to d/c home with 24/7 assist of daughter, use of w/c for safe transport to/from MD appts, and to progress to mod I function in home. Pt very weak and deconditioned an and unsure of medical prognosis and suspect pt will cont to progressively get weaker. Pt also not eating. Acute PT to cont to follow.    Follow Up Recommendations  SNF;Supervision/Assistance - 24 hour     Equipment Recommendations  Wheelchair (measurements PT);Wheelchair cushion (measurements PT);Other (comment)  3n1 commode as well.   Recommendations for Other Services OT consult     Precautions / Restrictions Precautions Precautions: Fall Restrictions Weight Bearing Restrictions: No    Mobility  Bed Mobility Overal bed mobility: Needs Assistance Bed Mobility: Rolling;Sidelying to Sit Rolling: Modified independent (Device/Increase time) Sidelying to sit: Min assist       General bed mobility comments: HOB elevated, pt used bed rail, minA for trunk elevation, increased time  Transfers Overall transfer level: Needs assistance Equipment used: None Transfers: Sit to/from Stand Sit to Stand: Min assist         General transfer comment: minA to steady, no physical assist to power up, increased time  Ambulation/Gait Ambulation/Gait assistance: Min assist;+2  physical assistance;+2 safety/equipment Gait Distance (Feet): 20 Feet Assistive device: Rolling walker (2 wheeled) Gait Pattern/deviations: Trunk flexed;Step-to pattern;Decreased step length - right;Decreased step length - left;Shuffle Gait velocity: dec Gait velocity interpretation: <1.31 ft/sec, indicative of household ambulator General Gait Details: decreased step height, frequent stopping due to fatigue, minA for walker mangement during turns, minimal foot clearance. pt very  fatigued   Marine scientist [place] (Stroke Patients Only)       Balance Overall balance assessment: Needs assistance Sitting-balance support: Feet supported;Bilateral upper extremity supported Sitting balance-Leahy Scale: Fair Sitting balance - Comments: bil UE supported, flexed posture, supervision EOb.    Standing balance support: Bilateral upper extremity supported Standing balance-Leahy Scale: Poor Standing balance comment: needs exernal support in standing.                             Cognition Arousal/Alertness: Awake/alert Behavior During Therapy: Flat affect Overall Cognitive Status: Impaired/Different from baseline Area of Impairment: Orientation;Attention;Following commands;Problem solving                 Orientation Level: Disoriented to(unable to state birthdate) Current Attention Level: Focused       Awareness: Intellectual Problem Solving: Slow processing;Requires verbal cues;Requires tactile cues General Comments: pt very weak/soft spoken, perseverated on birth day but unable to state birth year      Exercises      General Comments General comments (skin integrity, edema, etc.): VSS      Pertinent Vitals/Pain Pain  Assessment: Faces Faces Pain Scale: No hurt    Home Living                      Prior Function            PT Goals (current goals can now be found in the care plan section) Acute Rehab PT  Goals Patient Stated Goal: none stated Progress towards PT goals: Progressing toward goals    Frequency    Min 3X/week      PT Plan Current plan remains appropriate    Co-evaluation              AM-PAC PT "6 Clicks" Mobility   Outcome Measure  Help needed turning from your back to your side while in a flat bed without using bedrails?: A Little Help needed moving from lying on your back to sitting on the side of a flat bed without using bedrails?: A Little Help needed moving to and from a bed to a chair (including a wheelchair)?: A Lot Help needed standing up from a chair using your arms (e.g., wheelchair or bedside chair)?: A Lot Help needed to walk in hospital room?: A Lot Help needed climbing 3-5 steps with a railing? : Total 6 Click Score: 13    End of Session Equipment Utilized During Treatment: Gait belt Activity Tolerance: Patient limited by fatigue;Patient limited by lethargy Patient left: with call bell/phone within reach;in chair;with chair alarm set Nurse Communication: Mobility status PT Visit Diagnosis: Muscle weakness (generalized) (M62.81);Difficulty in walking, not elsewhere classified (R26.2)     Time: 7253-6644 PT Time Calculation (min) (ACUTE ONLY): 22 min  Charges:  $Gait Training: 8-22 mins                     Kittie Plater, PT, DPT Acute Rehabilitation Services Pager #: [phone] Office #: [phone]    Berline Lopes 01/07/2019, 9:54 AM

## 2019-01-07 NOTE — Consult Note (Signed)
Consultation Note Date: 01/07/2019   Patient Name: Cindy Ward  DOB: 28-May-1966  MRN: [id number]  Age / Sex: 53 y.o., female  PCP: [name], MD Referring Physician: Antonieta Pert, MD  Reason for Consultation: Establishing goals of care  HPI/Patient Profile: 53 y.o. female  with past medical history of stage IV Hodgkin's lymphoma (has not remained well enough to pursue treatment, 1 cycle chemo Feb), recent prolonged admission with E. Coli/group B strep bacteremia/C. diff and has continued to gradually decline since that admission. Admitted on 01/01/2019 with HCAP (COVID neg x 2), small PE (no anticoagulation s/t thrombocytopenia). She has continued with poor intake and weakness with overall failure to thrive. Prognosis very poor.   Clinical Assessment and Goals of Care: I have spoken today with Ms. Santoria. She is so very weak that her speech is in a whisper and difficult to understand. I am not sure that she understands our conversation. All she tells me is that she wants to go home.   I called and spoke with her daughter, [name]. [name] has moved her mother in with her and has been helping to care for her. [name] has emotional support from her sister, Anderson Malta, who is in nursing school in [place] but main physical caregiving is up to [place]. We discussed her mother's decline and the fact she is not eating and only drinking some sips. [name] verifies her mother does not want artificial feeding and I was clear that I did not feel this would be the answer and would make her mother better. At this time [name] agrees that DNR and having hospice at home is the best decision for her mother. She needs help to navigate this difficult road. Family is aware that prognosis is poor and could be weeks or less. [name] would like to have her mother at home with her so that they can spend time together (vs going to hospice facility).  Emotional support provided to [facility] as difficult decisions made today.   Care was coordinated with Dr. Lupita Leash, Dr. Remi Deter office, and with [facility]/CSW to arrange hospice. After discussion and plan to pursue hospice at home the appointment for [name] has been cancelled as I feel it would be difficult for family to get her to the appointment and the focus is comfort. All parties agree with plan for home with hospice.   I also went back to speak with Ms. Constance Holster regarding plan for DNR and home with hospice care. She did not appear anxious or surprised and agreed to plan. Again, I am unsure how much she understands but at this time agrees to above plan of care.   Primary Decision Maker PATIENT - confused. Next of kin are majority of adult children - 3 total but only 2 daughters [name] and Anderson Malta involved in care and decision making.     SUMMARY OF RECOMMENDATIONS   - DNR decided today - Patient wants to go home and daughter wants to bring her home - they all agree hospice will best help them  Code Status/Advance Care Planning:  DNR   Symptom Management:   Poor intake: Continue mirtazapine 7.5 mg qhs. Liberalize diet and offer food/drink frequently.   OxyIR 5 mg po prn for moderate pain.   Roxanol 5 mg SL prn added for severe pain or SOB.   Palliative Prophylaxis:   Aspiration, Bowel Regimen, Frequent Pain Assessment, Oral Care and Turn Reposition  Additional Recommendations (Limitations, Scope, Preferences):  Avoid Hospitalization and Full Comfort Care  Psycho-social/Spiritual:   Desire for further Chaplaincy support:no  Additional Recommendations: Caregiving  Support/Resources, Education on Hospice and Grief/Bereavement Support  Prognosis:   < 6 weeks  Discharge Planning: Home with Hospice      Primary Diagnoses: Present on Admission:  CAP (community acquired pneumonia)   I have reviewed the medical record, interviewed the patient and family, and examined the  patient. The following aspects are pertinent.  Past Medical History:  Diagnosis Date   Cancer ([place])    Hypertension    Pneumonia    Thyroid disease    Social History   Socioeconomic History   Marital status: Divorced    Spouse name: Not on file   Number of children: Not on file   Years of education: Not on file   Highest education level: Not on file  Occupational History   Not on file  Social Needs   Financial resource strain: Not on file   Food insecurity:    Worry: Not on file    Inability: Not on file   Transportation needs:    Medical: Not on file    Non-medical: Not on file  Tobacco Use   Smoking status: Never Smoker   Smokeless tobacco: Never Used  Substance and Sexual Activity   Alcohol use: Never    Frequency: Never   Drug use: Never   Sexual activity: Not on file  Lifestyle   Physical activity:    Days per week: 0 days    Minutes per session: 0 min   Stress: To some extent  Relationships   Social connections:    Talks on phone: Patient refused    Gets together: Patient refused    Attends religious service: Patient refused    Active member of club or organization: Patient refused    Attends meetings of clubs or organizations: Patient refused    Relationship status: Patient refused  Other Topics Concern   Not on file  Social History Narrative   Not on file   History reviewed. No pertinent family history. Scheduled Meds:  feeding supplement (ENSURE ENLIVE)  237 mL Oral BID BM   feeding supplement (PRO-STAT SUGAR FREE 64)  30 mL Oral BID   levothyroxine  50 mcg Oral Q0600   magnesium oxide  400 mg Oral BID   metoprolol tartrate  25 mg Oral BID   mirtazapine  7.5 mg Oral QHS   multivitamin with minerals  1 tablet Oral Daily   potassium chloride  40 mEq Oral Once   Continuous Infusions:  sodium chloride 100 mL/hr at 01/07/19 0259   PRN Meds:.acetaminophen, ibuprofen, ondansetron (ZOFRAN) IV, oxyCODONE, sodium  chloride flush Allergies  Allergen Reactions   Ceftriaxone Rash    Errythmatic rash to bilateral upper extremities and face; warm to touch   Review of Systems  Unable to perform ROS: Acuity of condition    Physical Exam Vitals signs and nursing note reviewed.  Constitutional:      Appearance: She is cachectic. She is ill-appearing.  Cardiovascular:     Rate  and Rhythm: Tachycardia present.  Pulmonary:     Comments: Breathes short and shallow at times but no distress Abdominal:     General: Abdomen is flat.  Neurological:     Mental Status: She is alert. She is disoriented.     Vital Signs: BP 102/64    Pulse (!) 128    Temp (!) 101.8 F (38.8 C) (Oral)    Resp 15    Ht 5\' 8"  (1.727 m)    Wt 57.9 kg    LMP  (LMP Unknown) Comment: >10 years no period   SpO2 100%    BMI 19.41 kg/m  Pain Scale: 0-10   Pain Score: 0-No pain   SpO2: SpO2: 100 % O2 Device:SpO2: 100 % O2 Flow Rate: .O2 Flow Rate (L/min): 1 L/min  IO: Intake/output summary:   Intake/Output Summary (Last 24 hours) at 01/07/2019 1235 Last data filed at 01/07/2019 1127 Gross per 24 hour  Intake 1101.91 ml  Output 692 ml  Net 409.91 ml    LBM: Last BM Date: 01/03/19 Baseline Weight: Weight: 54.8 kg Most recent weight: Weight: 57.9 kg     Palliative Assessment/Data: 30%     Time In: 1300 Time Out: 1430 Time Total: 90 min Greater than 50%  of this time was spent counseling and coordinating care related to the above assessment and plan.  Signed by: Vinie Sill, NP Palliative Medicine Team Pager # [phone] (M-F 8a-5p) Team Phone # [phone] (Nights/Weekends)

## 2019-01-07 NOTE — Progress Notes (Addendum)
Bladder scanned patient and found 764 mL.  Patient was able to void 550 mL of tea colored urine.  Post void scan revealed 428 mL  Notified day shift nurse T. Melina Copa, RN and  Maren Beach, MD.  Will continue to monitor .

## 2019-01-07 NOTE — Progress Notes (Signed)
PROGRESS NOTE    Cindy Ward  PJK:[id number] DOB: 1966/08/27 DOA: 01/01/2019 PCP: [name], MD   Brief Narrative: 79DXI with complex comorbidities with hypertension, stage IV Hodgkin's lymphoma with first cycle of chemotherapy in late February recently admitted with E. coli/group B strep bacteremia transferred to [facility] from [facility] ER on 4/8 after she presented with Generalized weakness, fever, cough, malaise and work-up in the ER showed left lower lobe pneumonia, new hypoxia with need for 2 L nasal cannula. Patient COVID test came back negative x2, was being treated for HCAP initially on ceftriaxone/azithromycin- changed to Levaquin due to concern for allergic reaction and then switched to vancomycin meropenem after persistent fever. Work-up with negative blood cultures so far, COVID eng x2 . Still febrile intermittently and tachycardic. Overnight 4/14 having urine retention.   Subjective: Alert awake, frail,ill looking. Nursing reports urine retention.  Had fever of 102.1 last night.  Antibiotic regimen this morning.Currently on room air.  Assessment & Plan:   HCAP :CT chest at [facility] showed patchy tree-in-bud opacity in the left lower lobe, chest x-ray without consolidation. WBC AT 3400. BLOOD CX ngtd, COVID neg x2.Patient having intermittent spikes of fever on daily basis-antibiotics will be stopped today, discussed with Dr. Posey Pronto who had discussed with infectious disease Dr. Tommy Medal and fever is suspected to be secondary to her Hodgkin's lymphoma and posisbly contributed by small PE seen in CT 4/13. Patient was tested for COVID x2 due to her temperature spike. Again temp spike this am-we will add Ibuprofen PRN.  We will cont on supportive care for fever.  Stage IV Hodgkin's lymphoma with associated pancytopenia/thrombocytopenia /anemia,  status post first cycle of chemo 11/14/2018: maanged by Dr Hinton Rao at [facility]. S/p granix on last admission. No further chemotherapy after patient  developed severe sepsis on last admission requiring prolonged stay and was discharged on 12/13/2018. Suspecting her lymphoma is advancing and patient without good performance and poor nutritional status will be unable to undergo chemotherapy session.Patient not eating or drinking well. Palliative care discussed with daughter today who desired for hospice at home.  I discussed with [name] her oncologist ar Cindy Ward, who agrees with recommendation for hospice at home and [name] can be attending on the case for hospice of Cindy Ward if needed.  Urine retention: pt having urine retention more than 7 stomal voided 550 mL postvoid residual was 421 mL this morning. Encouraged to ambulate w/ PT.Add flomax. Avoid cath but if persistent perform I/o. We will check UA.  Of note patient had needed Foley catheter placement while she was at [facility] and was removed 3/16, was seen by urology that time.  Recent C diff and was treated w po vancomycin. So we will hold off on further antibiotics. Watch off antibiotics.  Acute respiratory failure with hypoxia:Currently on room air.  Monitor during ambulation.  Severe protein calorie malnutrition, continue to augment nutrition, has underlying Hodgkin's lymphoma  Sinus tachycardia from dehydration, con ns at 100 ml/hr and  metoprolol 12.5 mg. HR poorly controlled on 120s- increase metoprolol  Small Acute peripheral PE patient is not a candidate for anticoagulation as per previous attending Dr. Posey Pronto and Dr. Remi Deter discussion given her severe thrombocytopenia.  Upper extremity bilateral redness:resolved.  Had similar presentation on last admission work-up including complement levels, HIV, ANCA, hepatitis, rheumatoid factors were all negative.   DVT prophylaxis: SCD Code Status: Changed to DNR.Overall prognosis is poor. Appreciate palliative Care.  Family Communication:Discussed plan of care with the patient.  I discussed  with palliative care team, patient's primary  oncologist. Disposition Plan: remains inpatient pending clinical improvement.  PT/OT assessed and recommended skilled nursing facility placement Patient is too frail to go home. Planning for hospice at home cone arranged  Consultants:  Oncology-Dr Hinton Rao ID- Dr Tommy Medal regarding Fever discussed by Dr Posey Pronto.  Procedures:  CT ANGIO 12/1318  Small peripheral emboli over the right middle lobe pulmonary arteries. Small bilateral pleural effusions with associated bibasilar atelectasis slightly improved. Interval decrease in bilateral axillary adenopathy with surgical clips over the left axillary nodes compatible with known lymphoma. Borderline cardiomegaly with slight improvement of a small amount of pericardial fluid. Mild prominence of the thyroid with stable subcentimeter hypodense nodule over the isthmus.  Suggestion mild splenomegaly unchanged.  Antimicrobials: Anti-infectives (From admission, onward)   Start     Dose/Rate Route Frequency Ordered Stop   01/07/19 0830  vancomycin (VANCOCIN) 1,000 mg in sodium chloride 0.9 % 250 mL IVPB     1,000 mg 250 mL/hr over 60 Minutes Intravenous Every 12 hours 01/07/19 0816     01/07/19 0800  meropenem (MERREM) 2 g in sodium chloride 0.9 % 100 mL IVPB     2 g 200 mL/hr over 30 Minutes Intravenous Every 8 hours 01/07/19 0721 01/09/19 0559   01/06/19 1500  vancomycin (VANCOCIN) IVPB 1000 mg/200 mL premix  Status:  Discontinued     1,000 mg 200 mL/hr over 60 Minutes Intravenous Every 24 hours 01/06/19 0817 01/06/19 1047   01/05/19 1700  meropenem (MERREM) 2 g in sodium chloride 0.9 % 100 mL IVPB  Status:  Discontinued     2 g 200 mL/hr over 30 Minutes Intravenous Every 8 hours 01/05/19 1406 01/06/19 1047   01/05/19 1500  vancomycin (VANCOCIN) 1,000 mg in sodium chloride 0.9 % 250 mL IVPB  Status:  Discontinued     1,000 mg 250 mL/hr over 60 Minutes Intravenous Every 24 hours 01/05/19 1353 01/06/19 0814   01/04/19 2300  meropenem (MERREM) 2  g in sodium chloride 0.9 % 100 mL IVPB  Status:  Discontinued     2 g 200 mL/hr over 30 Minutes Intravenous Every 12 hours 01/04/19 1458 01/05/19 1406   01/04/19 2100  vancomycin (VANCOCIN) IVPB 750 mg/150 ml premix  Status:  Discontinued     750 mg 150 mL/hr over 60 Minutes Intravenous Every 12 hours 01/04/19 0809 01/04/19 1511   01/04/19 1512  vancomycin variable dose per unstable renal function (pharmacist dosing)  Status:  Discontinued      Does not apply See admin instructions 01/04/19 1512 01/06/19 1047   01/04/19 0900  meropenem (MERREM) 1 g in sodium chloride 0.9 % 100 mL IVPB  Status:  Discontinued     1 g 200 mL/hr over 30 Minutes Intravenous Every 8 hours 01/04/19 0809 01/04/19 1458   01/04/19 0830  vancomycin (VANCOCIN) 1,250 mg in sodium chloride 0.9 % 250 mL IVPB     1,250 mg 166.7 mL/hr over 90 Minutes Intravenous  Once 01/04/19 0809 01/04/19 1203   01/03/19 1015  fluconazole (DIFLUCAN) tablet 200 mg     200 mg Oral  Once 01/03/19 1014 01/03/19 1108   01/02/19 1200  levofloxacin (LEVAQUIN) IVPB 750 mg  Status:  Discontinued     750 mg 100 mL/hr over 90 Minutes Intravenous Every 24 hours 01/02/19 1008 01/04/19 0809   01/02/19 1000  doxycycline (VIBRA-TABS) tablet 100 mg  Status:  Discontinued     100 mg Oral Every 12 hours 01/02/19 0956 01/02/19  1001   01/01/19 1200  cefTRIAXone (ROCEPHIN) 1 g in sodium chloride 0.9 % 100 mL IVPB  Status:  Discontinued     1 g 200 mL/hr over 30 Minutes Intravenous Every 24 hours 01/01/19 0239 01/02/19 0956   01/01/19 1200  azithromycin (ZITHROMAX) 500 mg in sodium chloride 0.9 % 250 mL IVPB  Status:  Discontinued     500 mg 250 mL/hr over 60 Minutes Intravenous Every 24 hours 01/01/19 0239 01/02/19 0956     Objective: Vitals:   01/07/19 0030 01/07/19 0530 01/07/19 0922 01/07/19 0925  BP:  101/60 102/64 102/64  Pulse:  (!) 110  (!) 128  Resp:      Temp: 98.4 F (36.9 C) 98.2 F (36.8 C)    TempSrc: Oral Oral    SpO2:  100%      Weight:      Height:        Intake/Output Summary (Last 24 hours) at 01/07/2019 1135 Last data filed at 01/07/2019 1127 Gross per 24 hour  Intake 1101.91 ml  Output 692 ml  Net 409.91 ml   Filed Weights   01/03/19 1400 01/04/19 0436 01/06/19 1831  Weight: 54.5 kg 58.6 kg 57.9 kg   Weight change:   Body mass index is 19.41 kg/m.  Intake/Output from previous day: 04/13 0701 - 04/14 0700 In: 1958.7 [I.V.:1858.7; IV Piggyback:100] Out: -  Intake/Output this shift: Total I/O In: -  Out: 692 [Urine:692]  Examination:  General exam: Appears frail, awake, cachectic and older for age HEENT:PERRL,Oral mucosa moist, Ear/Nose normal on gross exam Respiratory system: Bilateral equal air entry, normal vesicular breath sounds, no wheezes or crackles  Cardiovascular system: S1 & S2 heard,No JVD, murmurs. Gastrointestinal system: Abdomen is  soft, non tender, non distended, BS +  Nervous System:Alert and oriented. No focal neurological deficits/moving extremities, sensation intact. Extremities:Thin, no clubbing, distal peripheral pulses palpable. Skin: No rashes, lesions, no icterus. MSK: Normal muscle bulk,tone,power  Medications:  Scheduled Meds:  feeding supplement (ENSURE ENLIVE)  237 mL Oral BID BM   feeding supplement (PRO-STAT SUGAR FREE 64)  30 mL Oral BID   levothyroxine  50 mcg Oral Q0600   magnesium oxide  400 mg Oral BID   metoprolol tartrate  12.5 mg Oral BID   mirtazapine  7.5 mg Oral QHS   multivitamin with minerals  1 tablet Oral Daily   Continuous Infusions:  sodium chloride 100 mL/hr at 01/07/19 0259   meropenem (MERREM) IV 2 g (01/07/19 0825)   vancomycin 1,000 mg (01/07/19 0922)    Data Reviewed:I have personally reviewed following labs and imaging studies  CBC: Recent Labs  Lab 01/03/19 0402 01/03/19 1657 01/04/19 0748 01/05/19 0540 01/06/19 0635 01/07/19 0801  WBC 3.9* 3.3* 21.6* 4.3 2.7* 3.4*  NEUTROABS 2.9  --  17.0* 3.2 2.1 2.9   HGB 6.4* 7.4* 10.4* 7.8* 7.7* 7.3*  HCT 19.7* 22.0* 29.9* 24.3* 22.7* 22.1*  MCV 90.8 88.0 89.8 90.0 89.4 91.3  PLT 61* 55* 156 56* 29* 22*   Basic Metabolic Panel: Recent Labs  Lab 01/02/19 0805 01/03/19 0402 01/04/19 0748 01/05/19 0540 01/06/19 0635 01/07/19 0801  NA 133* 130* 135 134* 134* 137  K 3.9 3.7 3.4* 3.5 3.5 3.2*  CL 98 97* 96* 98 102 105  CO2 27 26 25 23 23  21*  GLUCOSE 97 100* 213* 62* 70 103*  BUN 13 11 19 12 12 16   CREATININE 0.68 0.72 1.47* 0.69 0.54 0.72  CALCIUM 7.5* 7.6* 8.1* 7.1*  7.1* 6.7*  MG 1.7 1.7  --  1.7 1.9  --    GFR: Estimated Creatinine Clearance: 74.3 mL/min (by C-G formula based on SCr of 0.72 mg/dL). Liver Function Tests: Recent Labs  Lab 01/02/19 0805 01/03/19 0402 01/04/19 0748 01/05/19 0540 01/06/19 0635  AST 98* 152* 14* 219* 170*  ALT 40 48* 12 57* 43  ALKPHOS 450* 513* 153* 379* 309*  BILITOT 1.3* 1.6* 0.6 2.5* 3.1*  PROT 5.3* 5.3* 5.6* 5.3* 5.0*  ALBUMIN 1.4* 1.3* 2.3* 1.2* 1.1*   No results for input(s): LIPASE, AMYLASE in the last 168 hours. No results for input(s): AMMONIA in the last 168 hours. Coagulation Profile: No results for input(s): INR, PROTIME in the last 168 hours. Cardiac Enzymes: No results for input(s): CKTOTAL, CKMB, CKMBINDEX, TROPONINI in the last 168 hours. BNP (last 3 results) No results for input(s): PROBNP in the last 8760 hours. HbA1C: No results for input(s): HGBA1C in the last 72 hours. CBG: No results for input(s): GLUCAP in the last 168 hours. Lipid Profile: No results for input(s): CHOL, HDL, LDLCALC, TRIG, CHOLHDL, LDLDIRECT in the last 72 hours. Thyroid Function Tests: Recent Labs    01/06/19 0635  TSH 0.521  FREET4 0.72*   Anemia Panel: No results for input(s): VITAMINB12, FOLATE, FERRITIN, TIBC, IRON, RETICCTPCT in the last 72 hours. Sepsis Labs: Recent Labs  Lab 01/02/19 0805 01/04/19 0748  PROCALCITON 2.12 0.36    Recent Results (from the past 240 hour(s))  Culture,  blood (routine x 2)     Status: None   Collection Time: 01/01/19  3:29 AM  Result Value Ref Range Status   Specimen Description BLOOD LEFT ARM  Final   Special Requests   Final    BOTTLES DRAWN AEROBIC ONLY Blood Culture adequate volume   Culture   Final    NO GROWTH 5 DAYS Performed at [facility], [address] [place], [place] [address]    Report Status 01/06/2019 FINAL  Final  Culture, blood (routine x 2)     Status: None   Collection Time: 01/01/19  3:29 AM  Result Value Ref Range Status   Specimen Description BLOOD LEFT ARM  Final   Special Requests   Final    BOTTLES DRAWN AEROBIC ONLY Blood Culture adequate volume   Culture   Final    NO GROWTH 5 DAYS Performed at [facility], [place] [address], [place], [place] [address]    Report Status 01/06/2019 FINAL  Final  Novel Coronavirus, NAA (hospital order; send-out to ref lab)     Status: None   Collection Time: 01/01/19  4:05 AM  Result Value Ref Range Status   SARS-CoV-2, NAA NOT DETECTED NOT DETECTED Final    Comment: Negative (Not Detected) results do not exclude infection caused by SARS CoV 2 and should not be used as the sole basis for treatment or other patient management decisions. Optimum specimen types and timing for peak viral levels during infections caused  by SARS CoV 2 have not been determined. Collection of multiple specimens (types and time points) from the same patient may be necessary to detect the virus. Improper specimen collection and handling, sequence variability underlying assay primers and or probes, or the presence of organisms in  quantities less than the limit of detection of the assay may lead to false negative results. Positive and negative predictive values of testing are highly dependent on prevalence. False negative results are more likely when prevalence of disease  is high. (NOTE) The expected result is Negative (Not Detected). The SARS CoV 2 test is intended for the presumptive  qualitative  detection of nucleic acid from SARS CoV 2 in upper and lower  respir atory specimens. Testing methodology is real time RT PCR. Test results must be correlated with clinical presentation and  evaluated in the context of other laboratory and epidemiologic data.  Test performance can be affected because the epidemiology and  clinical spectrum of infection caused by SARS CoV 2 is not fully  known. For example, the optimum types of specimens to collect and  when during the course of infection these specimens are most likely  to contain detectable viral RNA may not be known. This test has not been Food and Drug Administration (FDA) cleared or  approved and has been authorized by FDA under an Emergency Use  Authorization (EUA). The test is only authorized for the duration of  the declaration that circumstances exist justifying the authorization  of emergency use of in vitro diagnostic tests for detection and or  diagnosis of SARS CoV 2 under Section 564(b)(1) of the Act, 21 U.S.C.  section [phone] 3(b)(1), unless the authorization is terminated or   revoked sooner. [facility] is certified under the  Clinical Laboratory Improvement Amendments of 1988 (CLIA), 42 U.S.C.  section [phone], to perform high complexity tests. Performed at [facility] [id number], [place], [place] [address] Laboratory Director: Loleta Books, MD Fact Sheet for Healthcare Providers  BankingDealers.co.za Fact Sheet for Patients  StrictlyIdeas.no    Coronavirus Source NASOPHARYNGEAL  Final    Comment: Performed at [facility], [address], [place], [place] [address]  Respiratory Panel by PCR     Status: None   Collection Time: 01/01/19  4:05 AM  Result Value Ref Range Status   Adenovirus NOT DETECTED NOT DETECTED Final   Coronavirus 229E NOT DETECTED NOT DETECTED Final     Comment: (NOTE) The Coronavirus on the Respiratory Panel, DOES NOT test for the novel  Coronavirus (2019 nCoV)    Coronavirus HKU1 NOT DETECTED NOT DETECTED Final   Coronavirus NL63 NOT DETECTED NOT DETECTED Final   Coronavirus OC43 NOT DETECTED NOT DETECTED Final   Metapneumovirus NOT DETECTED NOT DETECTED Final   Rhinovirus / Enterovirus NOT DETECTED NOT DETECTED Final   Influenza A NOT DETECTED NOT DETECTED Final   Influenza B NOT DETECTED NOT DETECTED Final   Parainfluenza Virus 1 NOT DETECTED NOT DETECTED Final   Parainfluenza Virus 2 NOT DETECTED NOT DETECTED Final   Parainfluenza Virus 3 NOT DETECTED NOT DETECTED Final   Parainfluenza Virus 4 NOT DETECTED NOT DETECTED Final   Respiratory Syncytial Virus NOT DETECTED NOT DETECTED Final   Bordetella pertussis NOT DETECTED NOT DETECTED Final   Chlamydophila pneumoniae NOT DETECTED NOT DETECTED Final   Mycoplasma pneumoniae NOT DETECTED NOT DETECTED Final    Comment: Performed at [facility], [place] [address], [place], [place] [address]  MRSA PCR Screening     Status: None   Collection Time: 01/01/19  4:05 AM  Result Value Ref Range Status   MRSA by PCR NEGATIVE NEGATIVE Final    Comment:        The GeneXpert MRSA Assay (FDA approved for NASAL specimens only), is one component of a comprehensive MRSA colonization surveillance program. It is not intended to diagnose MRSA infection nor to guide or monitor treatment for MRSA infections. Performed  at [facility], [place] [address], [place], [place] [address]   Culture, Urine     Status: None   Collection Time: 01/04/19  7:35 AM  Result Value Ref Range Status   Specimen Description URINE, CLEAN CATCH  Final   Special Requests NONE  Final   Culture   Final    NO GROWTH Performed at [facility], [address] [place], [place] [address]    Report Status 01/05/2019 FINAL  Final  Culture, blood (routine x 2)     Status: None (Preliminary result)   Collection  Time: 01/04/19  7:48 AM  Result Value Ref Range Status   Specimen Description BLOOD LEFT HAND  Final   Special Requests   Final    AEROBIC BOTTLE ONLY Blood Culture results may not be optimal due to an inadequate volume of blood received in culture bottles   Culture   Final    NO GROWTH 2 DAYS Performed at [facility], [place] [address], [place], [place] [address]    Report Status PENDING  Incomplete  Culture, blood (routine x 2)     Status: None (Preliminary result)   Collection Time: 01/04/19  7:50 AM  Result Value Ref Range Status   Specimen Description BLOOD LEFT ANTECUBITAL  Final   Special Requests AEROBIC BOTTLE ONLY Blood Culture adequate volume  Final   Culture   Final    NO GROWTH 2 DAYS Performed at [facility], [place] [address], [place], [place] [address]    Report Status PENDING  Incomplete  Novel Coronavirus, NAA (hospital order; send-out to ref lab)     Status: None   Collection Time: 01/04/19  7:50 AM  Result Value Ref Range Status   SARS-CoV-2, NAA NOT DETECTED NOT DETECTED Final    Comment: Performed at [facility]  Final    Comment: Performed at [facility], [place] [address] [place],  [address]    Radiology Studies: Ct Angio Chest Pe W Or Wo Contrast  Addendum Date: 01/06/2019   ADDENDUM REPORT: 01/06/2019 15:54 ADDENDUM: Critical Value/emergent results were called by telephone at the time of interpretation on 01/06/2019 at 3:54 pm to Dr. Berle Mull , who verbally acknowledged these results. Electronically Signed   By: Marin Olp M.D.   On: 01/06/2019 15:54   Result Date: 01/06/2019 CLINICAL DATA:  Shortness of breath. History of lymphoma. Low probability for COVID-19. Hypoxia on exertion. EXAM: CT ANGIOGRAPHY CHEST WITH CONTRAST TECHNIQUE: Multidetector CT imaging of the chest was performed using the standard protocol during bolus administration of intravenous contrast. Multiplanar CT image  reconstructions and MIPs were obtained to evaluate the vascular anatomy. CONTRAST:  51mL OMNIPAQUE IOHEXOL 350 MG/ML SOLN COMPARISON:  11/18/2018 FINDINGS: Cardiovascular: Right-sided Port-A-Cath with tip over the cavoatrial junction. Borderline cardiomegaly. Small amount of pericardial fluid is present with interval improvement. Thoracic aorta is normal in caliber without evidence of aneurysm or dissection. Pulmonary arterial system is adequately opacified. Small peripheral emboli are seen over the more peripheral right middle lobar arteries. No other right-sided emboli are noted. No left-sided pulmonary emboli visualized. RV/LV ratio is 0.8. Remaining vascular structures are unremarkable. Mediastinum/Nodes: No mediastinal or hilar adenopathy. Mild prominence of the thyroid gland with subcentimeter hypodense nodule over the isthmus unchanged. Interval decrease superior mediastinal and bilateral axillary adenopathy with surgical clips over the left axillary nodes compatible with known lymphoma. Lungs/Pleura: Lungs are adequately inflated and demonstrate  small bilateral pleural effusions slightly improved. There is minimal associated bibasilar atelectasis improved. Airways are normal. Upper Abdomen: Mild stable subjective splenomegaly. No acute findings. Musculoskeletal: Unremarkable. Review of the MIP images confirms the above findings. IMPRESSION: Small peripheral emboli over the right middle lobe pulmonary arteries. Small bilateral pleural effusions with associated bibasilar atelectasis slightly improved. Interval decrease in bilateral axillary adenopathy with surgical clips over the left axillary nodes compatible with known lymphoma. Borderline cardiomegaly with slight improvement of a small amount of pericardial fluid. Mild prominence of the thyroid with stable subcentimeter hypodense nodule over the isthmus. Suggestion mild splenomegaly unchanged. Electronically Signed: By: Marin Olp M.D. On: 01/06/2019  15:40    LOS: 6 days   Time spent: More than 50% of that time was spent in counseling and/or coordination of care.  Antonieta Pert, MD [facility]  01/07/2019, 11:35 AM

## 2019-01-08 DIAGNOSIS — C8100 Nodular lymphocyte predominant Hodgkin lymphoma, unspecified site: Secondary | ICD-10-CM

## 2019-01-08 DIAGNOSIS — R627 Adult failure to thrive: Secondary | ICD-10-CM

## 2019-01-08 DIAGNOSIS — Z515 Encounter for palliative care: Secondary | ICD-10-CM

## 2019-01-08 LAB — CBC
HCT: 24.4 % — ABNORMAL LOW (ref 36.0–46.0)
Hemoglobin: 7.7 g/dL — ABNORMAL LOW (ref 12.0–15.0)
MCH: 28.7 pg (ref 26.0–34.0)
MCHC: 31.6 g/dL (ref 30.0–36.0)
MCV: 91 fL (ref 80.0–100.0)
Platelets: 12 10*3/uL — CL (ref 150–400)
RBC: 2.68 MIL/uL — ABNORMAL LOW (ref 3.87–5.11)
RDW: 18.6 % — ABNORMAL HIGH (ref 11.5–15.5)
WBC: 2.9 10*3/uL — ABNORMAL LOW (ref 4.0–10.5)
nRBC: 0 % (ref 0.0–0.2)

## 2019-01-08 LAB — COMPREHENSIVE METABOLIC PANEL WITH GFR
ALT: 62 U/L — ABNORMAL HIGH (ref 0–44)
AST: 357 U/L — ABNORMAL HIGH (ref 15–41)
Albumin: <1 g/dL — ABNORMAL LOW (ref 3.5–5.0)
Alkaline Phosphatase: 154 U/L — ABNORMAL HIGH (ref 38–126)
Anion gap: 9 (ref 5–15)
BUN: 28 mg/dL — ABNORMAL HIGH (ref 6–20)
CO2: 22 mmol/L (ref 22–32)
Calcium: 7.1 mg/dL — ABNORMAL LOW (ref 8.9–10.3)
Chloride: 107 mmol/L (ref 98–111)
Creatinine, Ser: 0.8 mg/dL (ref 0.44–1.00)
GFR calc Af Amer: >60 mL/min
GFR calc non Af Amer: >60 mL/min
Glucose, Bld: 105 mg/dL — ABNORMAL HIGH (ref 70–99)
Potassium: 3.5 mmol/L (ref 3.5–5.1)
Sodium: 138 mmol/L (ref 135–145)
Total Bilirubin: 4.6 mg/dL — ABNORMAL HIGH (ref 0.3–1.2)
Total Protein: 4.5 g/dL — ABNORMAL LOW (ref 6.5–8.1)

## 2019-01-08 MED ORDER — TAMSULOSIN HCL 0.4 MG PO CAPS: 0.4000 mg | ORAL_CAPSULE | Freq: Every day | ORAL | 0 refills | Status: AC

## 2019-01-08 MED ORDER — IBUPROFEN 400 MG PO TABS: 400.0000 mg | ORAL_TABLET | Freq: Four times a day (QID) | ORAL | 0 refills | Status: AC | PRN

## 2019-01-08 MED ORDER — METOPROLOL TARTRATE 25 MG PO TABS: 12.5000 mg | ORAL_TABLET | Freq: Two times a day (BID) | ORAL | 0 refills | Status: AC

## 2019-01-08 MED ORDER — OXYCODONE HCL 5 MG PO TABS: 5.0000 mg | ORAL_TABLET | ORAL | 0 refills | Status: AC | PRN

## 2019-01-08 MED ORDER — MIRTAZAPINE 7.5 MG PO TABS: 7.5000 mg | ORAL_TABLET | Freq: Every day | ORAL | 0 refills | Status: AC

## 2019-01-08 MED ORDER — HEPARIN SOD (PORK) LOCK FLUSH 100 UNIT/ML IV SOLN
500.0000 [IU] | INTRAVENOUS | Status: AC | PRN
  Administered 2019-01-08: 500 [IU]

## 2019-01-08 MED ORDER — MORPHINE SULFATE (CONCENTRATE) 10 MG /0.5 ML PO SOLN: 5.0000 mg | ORAL | 0 refills | Status: AC | PRN

## 2019-01-08 NOTE — Progress Notes (Signed)
Palliative:  I met again today with Cindy Ward. She is anxious to go home. I reassured her that as soon as equipment can be delivered we are working to get her home. Noted issues with hemorrhoids and I offered her cream or suppository but she tells me that she is fine for now and does not desire any intervention for hemorrhoids. She requests some milk which I brought for her. She ate a couple bites of grits only. She has no questions or concerns at this time - only wants to go home. Emotional support provided.   CSW following to arrange home with hospice today. Overall failure to thrive s/t Hodgkin's lymphoma with prognosis possibly < 6 weeks.   Exam: Cachectic. Orientation a little better today but still with some situational confusion. No distress.   Plan: - Home with hospice to her daughter's Cindy Ward) home.  15 min  Vinie Sill, NP Palliative Medicine Team Pager # [phone] (M-F 8a-5p) Team Phone # [phone] (Nights/Weekends)

## 2019-01-08 NOTE — Discharge Summary (Signed)
[facility]  Physician Discharge Summary   Patient ID: Cindy Ward MRN: [id number] DOB/AGE: [birth year] 53 y.o.  Admit date: 01/01/2019 Discharge date: 01/08/2019  PCP: Cindy Hipps, MD  DISCHARGE DIAGNOSES:  HCAP Stage 4 Hodgkin's Lymphoma Severe Protein calorie Malnutrition    RECOMMENDATIONS FOR OUTPATIENT FOLLOW UP: 1. Patient being discharged home with hospice services  Home Health: None Equipment/Devices: None  CODE STATUS: DNR  DISCHARGE CONDITION: fair  Diet recommendation: As tolerated  INITIAL HISTORY: 53YoF with complex comorbidities with hypertension, stage IV Hodgkin's lymphoma with first cycle of chemotherapy in late February recently admitted with E. coli/group B strep bacteremia transferred to [facility] from [facility] ER on 4/8 after she presented with Generalized weakness, fever, cough, malaise and work-up in the ER showed left lower lobe pneumonia, new hypoxia with need for 2 L nasal cannula. Patient COVID test came back negative x2, was being treated for HCAP initially on ceftriaxone/azithromycin- changed to Levaquin due to concern for allergic reaction and then switched to vancomycin meropenem after persistent fever. Work-up with negative blood cultures so far, COVID eng x2 . Still febrile intermittently and tachycardic. Overnight 4/14 having urine retention.  Consultations: Oncology-Dr Cindy Ward ID- Dr Cindy Ward regarding Fever discussed by Dr Cindy Ward.    HOSPITAL COURSE:   HCAP :[facility] chest at [facility] showed patchy tree-in-bud opacity in the left lower lobe, chest x-ray without consolidation. WBC AT 3400. BLOOD CX ngtd, COVID neg x2. Patient with intermittent spikes of fever on daily basis.  Thought to be due to lymphoma rather than an infectious process. And posisbly contributed by small PE seen in CT 4/13.   Stage IV Hodgkin's lymphoma with associated pancytopenia/thrombocytopenia /anemia,  status post first cycle of chemo 11/14/2018:  Followed by Dr  Cindy Ward at [facility]. S/p granix on last admission. No further chemotherapy after patient developed severe sepsis on last admission requiring prolonged stay and was discharged on 12/13/2018. Suspecting her lymphoma is advancing and patient without good performance and poor nutritional status will be unable to undergo chemotherapy session. Patient not eating or drinking well. Palliative care discussed with daughter who desired for hospice at home.   Previous attending discussed with Dr. Hinton Ward, her oncologist ar Cindy Ward, who agrees with recommendation for hospice at home and Dr. Hinton Ward can be attending on the case for hospice of Cindy Ward if needed.  Urine retention: Patient experiencing urine retention.  She is required frequent in and out catheterization.  Since she will be going home with hospice services she might feel relief with having a Foley placed for comfort.  This has been ordered.  Continue Flomax.   Recent C diff and was treated w po vancomycin. So we will hold off on further antibiotics.  Acute respiratory failure with hypoxia: Stable on room air  Severe protein calorie malnutrition, continue to augment nutrition, has underlying Hodgkin's lymphoma  Sinus tachycardia  continue metoprolol  Small Acute peripheral PE patient is not a candidate for anticoagulation as per previous attending Dr. Posey Ward and Dr. Remi Ward discussion given her severe thrombocytopenia.  Upper extremity bilateral redness:resolved.  Had similar presentation on last admission work-up including complement levels, HIV, ANCA, hepatitis, rheumatoid factors were all negative.   Overall stable.  Okay for discharge to home today with hospice.   PERTINENT LABS:  The results of significant diagnostics from this hospitalization (including imaging, microbiology, ancillary and laboratory) are listed below for reference.    Microbiology: Recent Results (from the past 240 hour(s))  Culture, blood (routine x 2)  Status: None   Collection Time: 01/01/19  3:29 AM  Result Value Ref Range Status   Specimen Description BLOOD LEFT ARM  Final   Special Requests   Final    BOTTLES DRAWN AEROBIC ONLY Blood Culture adequate volume   Culture   Final    NO GROWTH 5 DAYS Performed at [facility], [address] [place], [place] [address]    Report Status 01/06/2019 FINAL  Final  Culture, blood (routine x 2)     Status: None   Collection Time: 01/01/19  3:29 AM  Result Value Ref Range Status   Specimen Description BLOOD LEFT ARM  Final   Special Requests   Final    BOTTLES DRAWN AEROBIC ONLY Blood Culture adequate volume   Culture   Final    NO GROWTH 5 DAYS Performed at [facility], [place] [address], [place], [place] [address]    Report Status 01/06/2019 FINAL  Final  Novel Coronavirus, NAA (hospital order; send-out to ref lab)     Status: None   Collection Time: 01/01/19  4:05 AM  Result Value Ref Range Status   SARS-CoV-2, NAA NOT DETECTED NOT DETECTED Final    Comment: Negative (Not Detected) results do not exclude infection caused by SARS CoV 2 and should not be used as the sole basis for treatment or other patient management decisions. Optimum specimen types and timing for peak viral levels during infections caused  by SARS CoV 2 have not been determined. Collection of multiple specimens (types and time points) from the same patient may be necessary to detect the virus. Improper specimen collection and handling, sequence variability underlying assay primers and or probes, or the presence of organisms in  quantities less than the limit of detection of the assay may lead to false negative results. Positive and negative predictive values of testing are highly dependent on prevalence. False negative results are more likely when prevalence of disease is high. (NOTE) The expected result is Negative (Not Detected). The SARS CoV 2 test is intended for the presumptive qualitative  detection of  nucleic acid from SARS CoV 2 in upper and lower  respir atory specimens. Testing methodology is real time RT PCR. Test results must be correlated with clinical presentation and  evaluated in the context of other laboratory and epidemiologic data.  Test performance can be affected because the epidemiology and  clinical spectrum of infection caused by SARS CoV 2 is not fully  known. For example, the optimum types of specimens to collect and  when during the course of infection these specimens are most likely  to contain detectable viral RNA may not be known. This test has not been Food and Drug Administration (FDA) cleared or  approved and has been authorized by FDA under an Emergency Use  Authorization (EUA). The test is only authorized for the duration of  the declaration that circumstances exist justifying the authorization  of emergency use of in vitro diagnostic tests for detection and or  diagnosis of SARS CoV 2 under Section 564(b)(1) of the Act, 21 U.S.C.  section [phone] 3(b)(1), unless the authorization is terminated or   revoked sooner. [facility] is certified under the  Clinical Laboratory Improvement Amendments of 1988 (CLIA), 42 U.S.C.  section [phone], to perform high complexity tests. Performed at [place] [id number], [place], [place] [address] Laboratory Director: Loleta Books, MD Fact Sheet for Healthcare Providers  BankingDealers.co.za Fact Sheet for Patients  StrictlyIdeas.no    Coronavirus Source NASOPHARYNGEAL  Final    Comment: Performed at [facility], [place] [address], [place], [place] [address]  Respiratory Panel by PCR     Status: None   Collection Time: 01/01/19  4:05 AM  Result Value Ref Range Status   Adenovirus NOT DETECTED NOT DETECTED Final   Coronavirus 229E NOT DETECTED NOT DETECTED Final    Comment: (NOTE) The  Coronavirus on the Respiratory Panel, DOES NOT test for the novel  Coronavirus (2019 nCoV)    Coronavirus HKU1 NOT DETECTED NOT DETECTED Final   Coronavirus NL63 NOT DETECTED NOT DETECTED Final   Coronavirus OC43 NOT DETECTED NOT DETECTED Final   Metapneumovirus NOT DETECTED NOT DETECTED Final   Rhinovirus / Enterovirus NOT DETECTED NOT DETECTED Final   Influenza A NOT DETECTED NOT DETECTED Final   Influenza B NOT DETECTED NOT DETECTED Final   Parainfluenza Virus 1 NOT DETECTED NOT DETECTED Final   Parainfluenza Virus 2 NOT DETECTED NOT DETECTED Final   Parainfluenza Virus 3 NOT DETECTED NOT DETECTED Final   Parainfluenza Virus 4 NOT DETECTED NOT DETECTED Final   Respiratory Syncytial Virus NOT DETECTED NOT DETECTED Final   Bordetella pertussis NOT DETECTED NOT DETECTED Final   Chlamydophila pneumoniae NOT DETECTED NOT DETECTED Final   Mycoplasma pneumoniae NOT DETECTED NOT DETECTED Final    Comment: Performed at [facility], [place] [address] [place], [place] [address]  MRSA PCR Screening     Status: None   Collection Time: 01/01/19  4:05 AM  Result Value Ref Range Status   MRSA by PCR NEGATIVE NEGATIVE Final    Comment:        The GeneXpert MRSA Assay (FDA approved for NASAL specimens only), is one component of a comprehensive MRSA colonization surveillance program. It is not intended to diagnose MRSA infection nor to guide or monitor treatment for MRSA infections. Performed at [facility], [place] [address] [place], [place] [address]   Culture, Urine     Status: None   Collection Time: 01/04/19  7:35 AM  Result Value Ref Range Status   Specimen Description URINE, CLEAN CATCH  Final   Special Requests NONE  Final   Culture   Final    NO GROWTH Performed at [facility], [address], [place], [place] [address]    Report Status 01/05/2019 FINAL  Final  Culture, blood (routine x 2)     Status: None (Preliminary result)   Collection Time: 01/04/19  7:48  AM  Result Value Ref Range Status   Specimen Description BLOOD LEFT HAND  Final   Special Requests   Final    AEROBIC BOTTLE ONLY Blood Culture results may not be optimal due to an inadequate volume of blood received in culture bottles   Culture   Final    NO GROWTH 4 DAYS Performed at [facility], [place] [address], [place],  [address]    Report Status PENDING  Incomplete  Culture, blood (routine x 2)     Status: None (Preliminary result)   Collection Time: 01/04/19  7:50 AM  Result Value Ref Range Status   Specimen Description BLOOD LEFT ANTECUBITAL  Final   Special Requests AEROBIC BOTTLE ONLY Blood Culture adequate volume  Final   Culture   Final    NO GROWTH 4 DAYS Performed at [facility], [place] [address] [place],  [address]  Report Status PENDING  Incomplete  Novel Coronavirus, NAA (hospital order; send-out to ref lab)     Status: None   Collection Time: 01/04/19  7:50 AM  Result Value Ref Range Status   SARS-CoV-2, NAA NOT DETECTED NOT DETECTED Final    Comment: Performed at [place]  Final    Comment: Performed at [facility], [place] [address], [place], [place] [address]     Labs: Basic Metabolic Panel: Recent Labs  Lab 01/02/19 0805 01/03/19 0402 01/04/19 3546 01/05/19 0540 01/06/19 0635 01/07/19 0801 01/08/19 0657  NA 133* 130* 135 134* 134* 137 138  K 3.9 3.7 3.4* 3.5 3.5 3.2* 3.5  CL 98 97* 96* 98 102 105 107  CO2 27 26 25 23 23  21* 22  GLUCOSE 97 100* 213* 62* 70 103* 105*  BUN 13 11 19 12 12 16  28*  CREATININE 0.68 0.72 1.47* 0.69 0.54 0.72 0.80  CALCIUM 7.5* 7.6* 8.1* 7.1* 7.1* 6.7* 7.1*  MG 1.7 1.7  --  1.7 1.9  --   --    Liver Function Tests: Recent Labs  Lab 01/03/19 0402 01/04/19 0748 01/05/19 0540 01/06/19 0635 01/08/19 0657  AST 152* 14* 219* 170* 357*  ALT 48* 12 57* 43 62*  ALKPHOS 513* 153* 379* 309* 154*  BILITOT 1.6* 0.6 2.5* 3.1* 4.6*  PROT 5.3* 5.6*  5.3* 5.0* 4.5*  ALBUMIN 1.3* 2.3* 1.2* 1.1* <1.0*   CBC: Recent Labs  Lab 01/03/19 0402  01/04/19 0748 01/05/19 0540 01/06/19 0635 01/07/19 0801 01/08/19 0657  WBC 3.9*   < > 21.6* 4.3 2.7* 3.4* 2.9*  NEUTROABS 2.9  --  17.0* 3.2 2.1 2.9  --   HGB 6.4*   < > 10.4* 7.8* 7.7* 7.3* 7.7*  HCT 19.7*   < > 29.9* 24.3* 22.7* 22.1* 24.4*  MCV 90.8   < > 89.8 90.0 89.4 91.3 91.0  PLT 61*   < > 156 56* 29* 22* 12*   < > = values in this interval not displayed.    IMAGING STUDIES Ct Angio Chest Pe W Or Wo Contrast  Addendum Date: 01/06/2019   ADDENDUM REPORT: 01/06/2019 15:54 ADDENDUM: Critical Value/emergent results were called by telephone at the time of interpretation on 01/06/2019 at 3:54 pm to Dr. Berle Mull , who verbally acknowledged these results. Electronically Signed   By: Marin Olp M.D.   On: 01/06/2019 15:54   Result Date: 01/06/2019 CLINICAL DATA:  Shortness of breath. History of lymphoma. Low probability for COVID-19. Hypoxia on exertion. EXAM: CT ANGIOGRAPHY CHEST WITH CONTRAST TECHNIQUE: Multidetector CT imaging of the chest was performed using the standard protocol during bolus administration of intravenous contrast. Multiplanar CT image reconstructions and MIPs were obtained to evaluate the vascular anatomy. CONTRAST:  2mL OMNIPAQUE IOHEXOL 350 MG/ML SOLN COMPARISON:  11/18/2018 FINDINGS: Cardiovascular: Right-sided Port-A-Cath with tip over the cavoatrial junction. Borderline cardiomegaly. Small amount of pericardial fluid is present with interval improvement. Thoracic aorta is normal in caliber without evidence of aneurysm or dissection. Pulmonary arterial system is adequately opacified. Small peripheral emboli are seen over the more peripheral right middle lobar arteries. No other right-sided emboli are noted. No left-sided pulmonary emboli visualized. RV/LV ratio is 0.8. Remaining vascular structures are unremarkable. Mediastinum/Nodes: No mediastinal or hilar adenopathy.  Mild prominence of the thyroid gland with subcentimeter hypodense nodule over the isthmus unchanged. Interval decrease superior mediastinal and bilateral axillary adenopathy with surgical clips over the left axillary  nodes compatible with known lymphoma. Lungs/Pleura: Lungs are adequately inflated and demonstrate small bilateral pleural effusions slightly improved. There is minimal associated bibasilar atelectasis improved. Airways are normal. Upper Abdomen: Mild stable subjective splenomegaly. No acute findings. Musculoskeletal: Unremarkable. Review of the MIP images confirms the above findings. IMPRESSION: Small peripheral emboli over the right middle lobe pulmonary arteries. Small bilateral pleural effusions with associated bibasilar atelectasis slightly improved. Interval decrease in bilateral axillary adenopathy with surgical clips over the left axillary nodes compatible with known lymphoma. Borderline cardiomegaly with slight improvement of a small amount of pericardial fluid. Mild prominence of the thyroid with stable subcentimeter hypodense nodule over the isthmus. Suggestion mild splenomegaly unchanged. Electronically Signed: By: Marin Olp M.D. On: 01/06/2019 15:40   Dg Chest Port 1 View  Result Date: 01/04/2019 CLINICAL DATA:  Dyspnea EXAM: PORTABLE CHEST 1 VIEW COMPARISON:  12/06/2018 chest radiograph. FINDINGS: Stable right internal jugular Port-A-Cath. Surgical clips overlie the left axilla. Stable cardiomediastinal silhouette with normal heart size. No pneumothorax. No pleural effusion. Lungs appear clear, with no acute consolidative airspace disease and no pulmonary edema. IMPRESSION: No active disease. Electronically Signed   By: Ilona Sorrel M.D.   On: 01/04/2019 11:08    DISCHARGE EXAMINATION: Vitals:   01/07/19 2100 01/08/19 0554 01/08/19 0918 01/08/19 1243  BP: 94/65 98/77 92/67  91/67  Pulse: 88 79 81 77  Resp:  18    Temp: (!) 97.5 F (36.4 C) 97.7 F (36.5 C)    TempSrc:  Oral Oral    SpO2: 100% 100% 100% 100%  Weight:      Height:       General appearance: Awake alert.  In no distress Resp: Clear to auscultation bilaterally.  Normal effort Cardio: S1-S2 is normal regular.  No S3-S4.  No rubs murmurs or bruit GI: Abdomen is soft.  Nontender nondistended.  Bowel sounds are present normal.  No masses organomegaly Extremities: No edema.  Full range of motion of lower extremities.   DISPOSITION: Home  Discharge Instructions    Call MD for:  difficulty breathing, headache or visual disturbances   Complete by:  As directed    Call MD for:  severe uncontrolled pain   Complete by:  As directed    Discharge instructions   Complete by:  As directed    You were cared for by a hospitalist during your hospital stay. If you have any questions about your discharge medications or the care you received while you were in the hospital after you are discharged, you can call the unit and asked to speak with the hospitalist on call if the hospitalist that took care of you is not available. Once you are discharged, your primary care physician will handle any further medical issues. Please note that NO REFILLS for any discharge medications will be authorized once you are discharged, as it is imperative that you return to your primary care physician (or establish a relationship with a primary care physician if you do not have one) for your aftercare needs so that they can reassess your need for medications and monitor your lab values. If you do not have a primary care physician, you can call [phone] for a physician referral.   Increase activity slowly   Complete by:  As directed         Allergies as of 01/08/2019      Reactions   Ceftriaxone Rash   Errythmatic rash to bilateral upper extremities and face; warm to touch  Medication List    STOP taking these medications   dexamethasone 4 MG tablet Commonly known as:  DECADRON   lidocaine-prilocaine cream Commonly  known as:  EMLA   magnesium oxide 400 MG tablet Commonly known as:  MAG-OX   potassium chloride 20 MEQ packet Commonly known as:  KLOR-CON     TAKE these medications   ibuprofen 400 MG tablet Commonly known as:  ADVIL,MOTRIN Take 1 tablet (400 mg total) by mouth every 6 (six) hours as needed for fever.   levothyroxine 75 MCG tablet Commonly known as:  SYNTHROID, LEVOTHROID Take 1 tablet by mouth daily.   LORazepam 0.5 MG tablet Commonly known as:  Ativan Take 1 tablet (0.5 mg total) by mouth every 6 (six) hours as needed (Nausea or vomiting).   magnesium oxide 400 (241.3 Mg) MG tablet Commonly known as:  MAG-OX Take 1 tablet (400 mg total) by mouth 2 (two) times daily.   metoprolol tartrate 25 MG tablet Commonly known as:  LOPRESSOR Take 0.5 tablets (12.5 mg total) by mouth 2 (two) times daily.   mirtazapine 7.5 MG tablet Commonly known as:  REMERON Take 1 tablet (7.5 mg total) by mouth at bedtime.   morphine CONCENTRATE 10 mg / 0.5 ml concentrated solution Take 0.25 mLs (5 mg total) by mouth every 2 (two) hours as needed for severe pain.   multivitamin with minerals Tabs tablet Take 1 tablet by mouth daily.   ondansetron 8 MG tablet Commonly known as:  Zofran Take 1 tablet (8 mg total) by mouth 2 (two) times daily as needed. Start on the third day after chemotherapy.   oxyCODONE 5 MG immediate release tablet Commonly known as:  Oxy IR/ROXICODONE Take 1 tablet (5 mg total) by mouth every 4 (four) hours as needed for moderate pain.   ProAir HFA 108 (90 Base) MCG/ACT inhaler Generic drug:  albuterol Take 2 puffs by mouth 4 (four) times daily as needed for wheezing or shortness of breath.   prochlorperazine 10 MG tablet Commonly known as:  COMPAZINE Take 1 tablet (10 mg total) by mouth every 6 (six) hours as needed (Nausea or vomiting).   tamsulosin 0.4 MG Caps capsule Commonly known as:  FLOMAX Take 1 capsule (0.4 mg total) by mouth daily after supper.         Follow-up Information    Cindy Hipps, MD Follow up on 01/07/2019.   Specialty:  Family Medicine Why:   8:40 this will be a tele health phone call, please download the zoom app Contact information: [place] [facility].   Specialty:  [place] Why:  Home Hospice Arranged Contact information: [place] [address] [phone]           TOTAL DISCHARGE TIME: 35 minutes  [facility] Pager on [email or web address]  01/08/2019, 2:17 PM

## 2019-01-08 NOTE — Progress Notes (Signed)
Noted bright red blood in stool this AM.  Patient had stools each void, unable to collect a urine sample this shift.  Post void residual was 140cc, pt does not endorse filling as if she did not empty. Spoke to [employer] NP this AM, plan to order CMP and CBC.  Did see hemorrhoid, and patient stated that she has had bleeding in the past.

## 2019-01-08 NOTE — Progress Notes (Signed)
1045: Foley inserted with [place] RN at bedside. Small amount of concentrated, yellow urine noted in foley bag.

## 2019-01-08 NOTE — TOC Transition Note (Signed)
Transition of Care [facility]) - CM/SW Discharge Note   Patient Details  Name: Cindy Ward MRN: [id number] Date of Birth: Feb 08, 1966  [facility] CM/SW Contact:  Benard Halsted, LCSW Phone Number: 01/08/2019, 11:44 AM   Clinical Narrative:    Patient will DC to: Home Anticipated DC date: 01/08/19 Family notified: Daughter, [name] Transport by: [name]    Per MD patient ready for DC to Home. RN, patient, patient's family notified of DC. DC packet on chart. Ambulance transport requested for patient.   CSW will sign off for now as social work intervention is no longer needed. Please consult [place] again if new needs arise.  Cedric Fishman, LCSW Clinical Social Worker [phone]    Final next level of care: Home w Hospice Care Barriers to Discharge: No Barriers Identified   Patient Goals and CMS Choice Patient states their goals for this hospitalization and ongoing recovery are:: get better and go home CMS Medicare.gov Compare Post Acute Care list provided to:: Other (Comment Required)(Daughter, [name]) Choice offered to / list presented to : NA  Discharge Placement                Patient to be transferred to facility by: [facility] Name of family member notified: Daughter, [name] Patient and family notified of of transfer: 01/08/19  Discharge Plan and Services   Discharge Planning Services: CM Consult Post Acute Care Choice: NA          DME Arranged: N/A DME Agency: NA HH Arranged: NA HH Agency: NA   Social Determinants of Health ([place]) Interventions     Readmission Risk Interventions Readmission Risk Prevention Plan 01/01/2019  Transportation Screening Complete  PCP or Specialist Appt within 5-7 Days Complete  Home Care Screening Not Complete  Home Care Screening Not Completed Comments NA  Medication Review (RN CM) Complete

## 2019-01-08 NOTE — Plan of Care (Signed)
DNR status made 4/14, Patient able to transfer to [facility] x1 assist, concern for no emptying bladder completely.  Will continue to monitor

## 2019-01-09 LAB — CULTURE, BLOOD (ROUTINE X 2)
Culture: NO GROWTH
Culture: NO GROWTH
Special Requests: ADEQUATE

## 2019-01-24 DEATH — deceased

## 2019-03-29 IMAGING — DX DG CHEST 1V PORT
1 series · 1 of 1 positions shown · non-contrast
Comparison: None.

CLINICAL DATA: Fever

EXAM:
PORTABLE CHEST 1 VIEW

[chest ap]
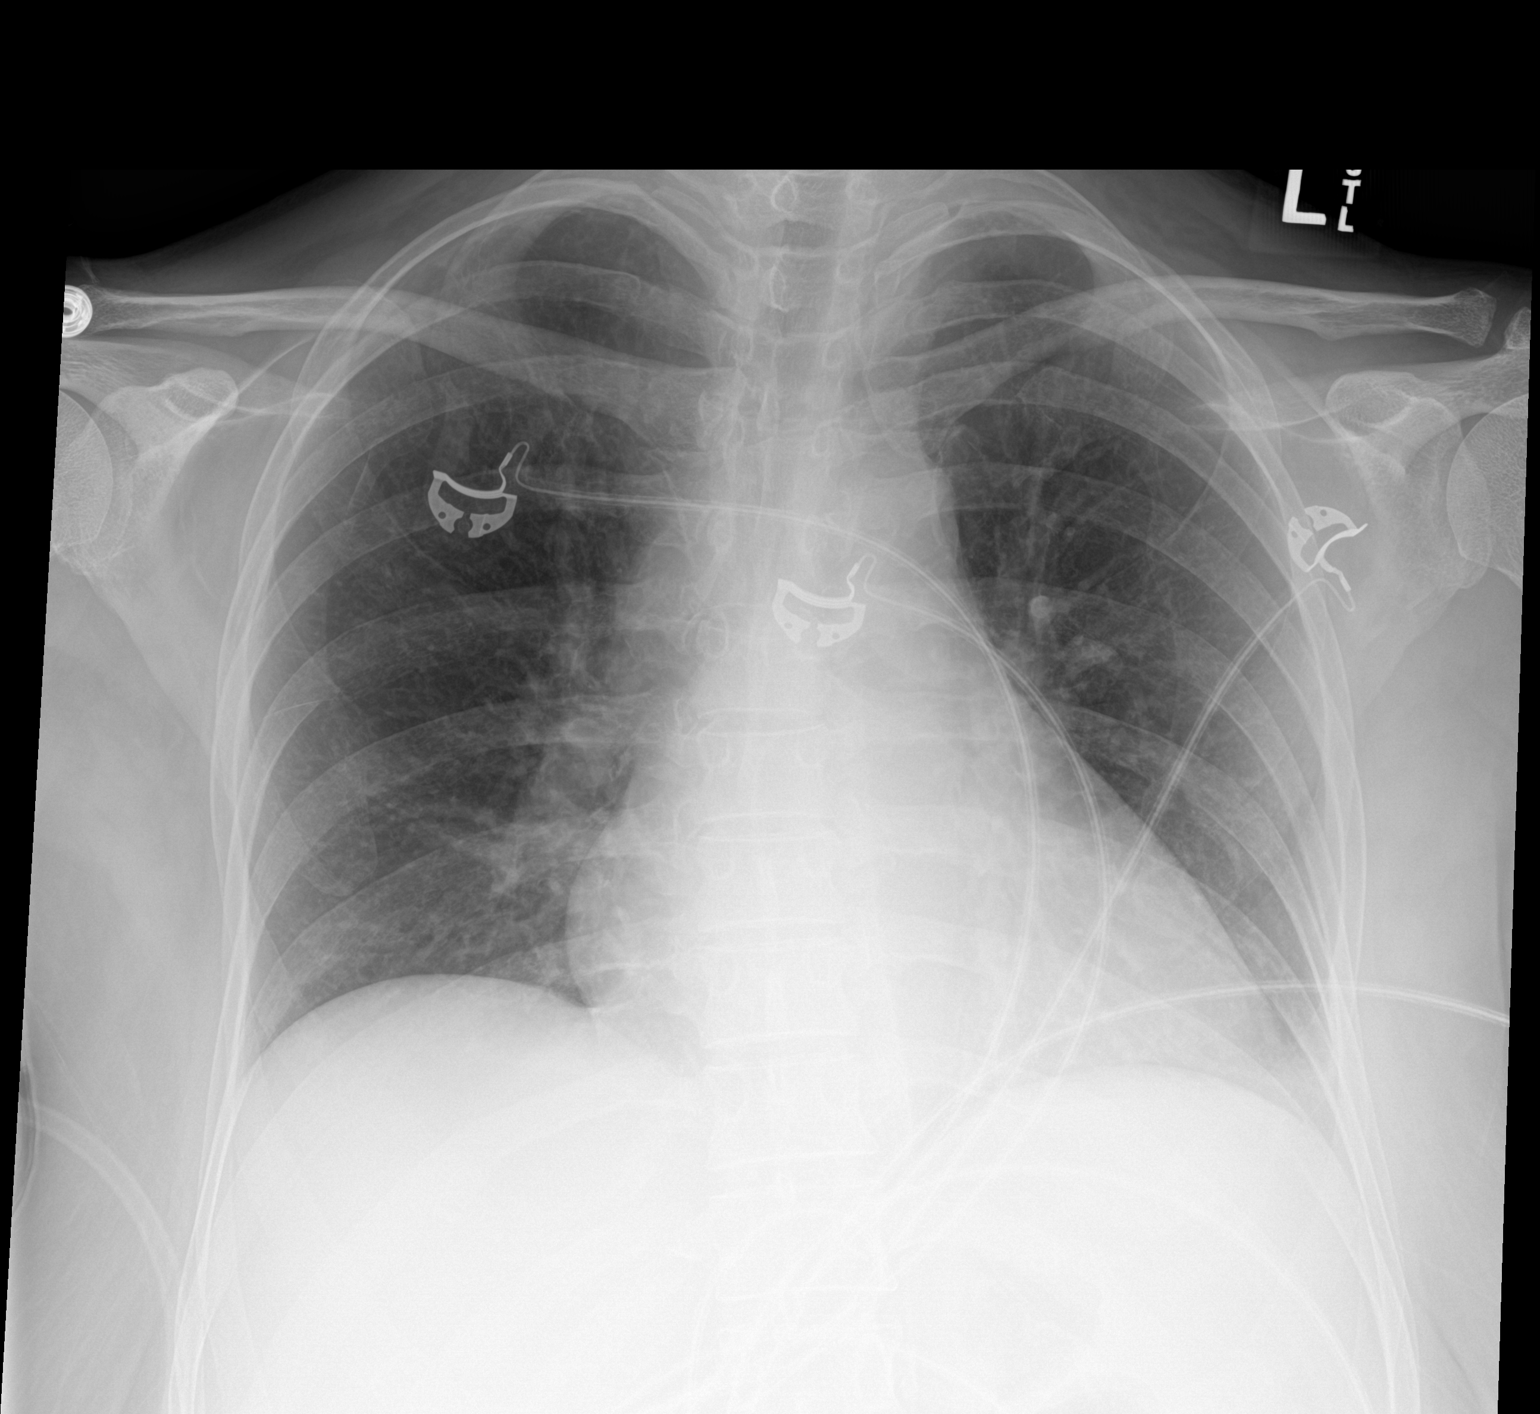

[1 of 1 positions shown; findings below may reference images not displayed]

FINDINGS: Mild cardiomegaly. No focal consolidation or effusion. No
pneumothorax.
IMPRESSION: No active disease.

## 2019-04-02 IMAGING — CT CT BIOPSY AND ASPIRATION BONE MARROW
1 of 2 series · 15 of 25 positions shown, 19 images · non-contrast
Comparison: none

INDICATION: Pancytopenia, concern for lymphoproliferative process

[Series 2: i-spiral 5.0 b40f · axial · 0.94mm/px · z∈[-121,-54]mm · 15 of 22 slices shown, 19 images]
[im 2/22  mediastinal]
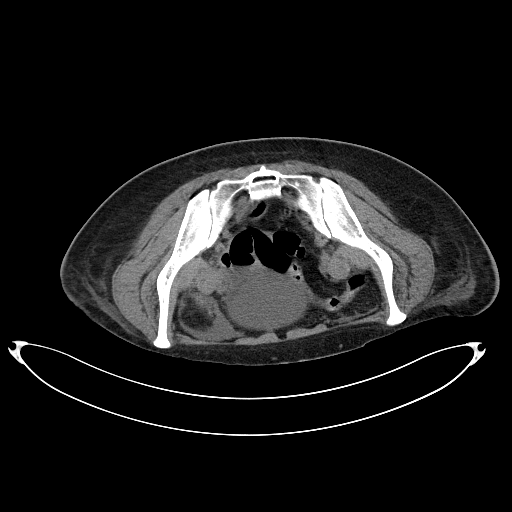
[im 2/22  lung]
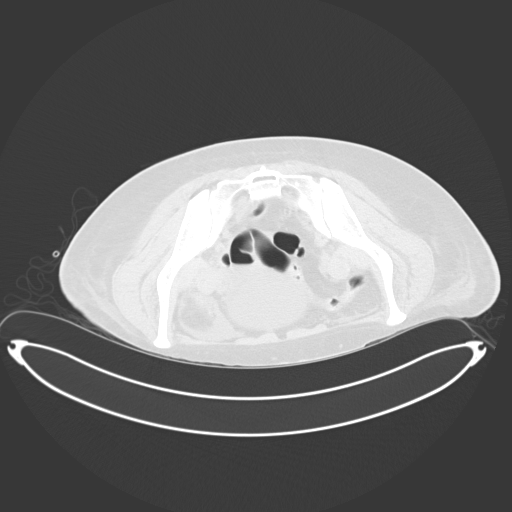
[im 4/22  lung]
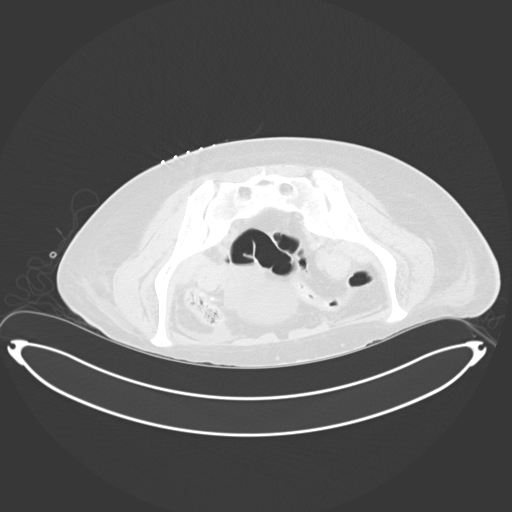
[im 5/22  lung]
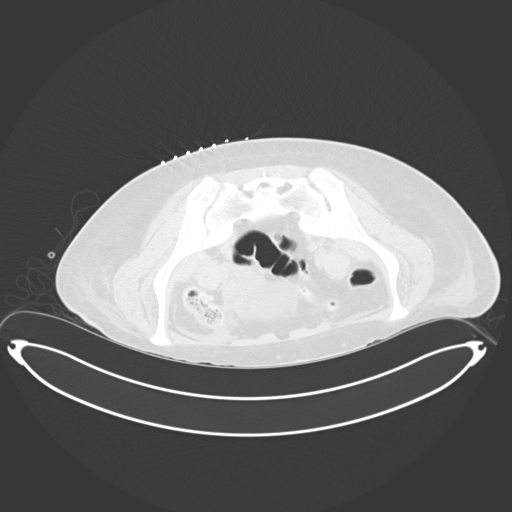
[im 6/22  lung]
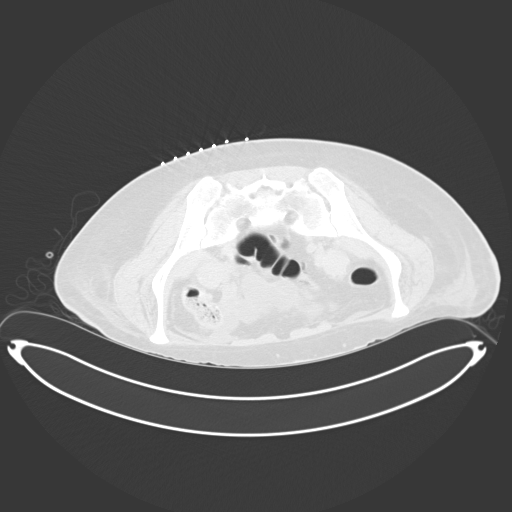
[im 8/22  mediastinal]
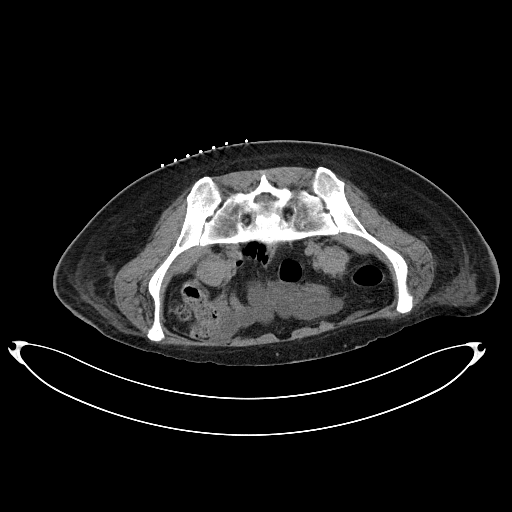
[im 8/22  lung]
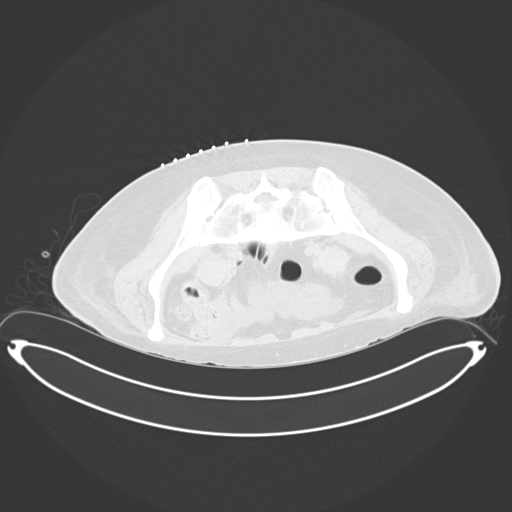
[im 9/22  lung]
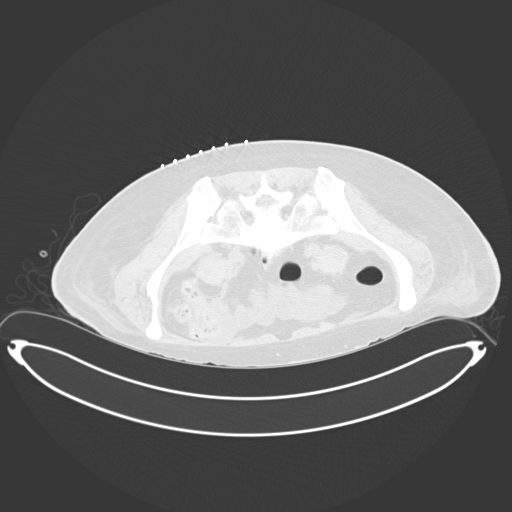
[im 10/22  lung]
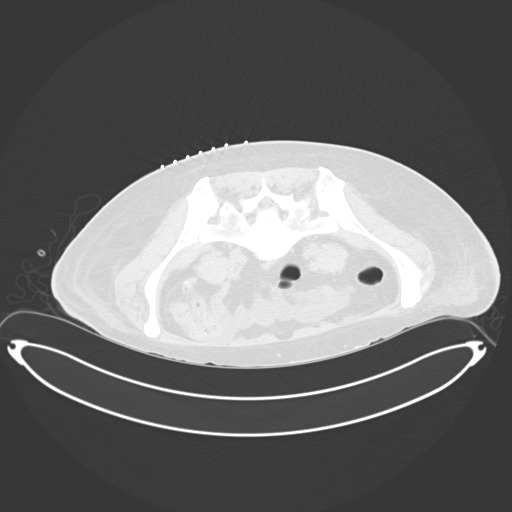
[im 12/22  lung]
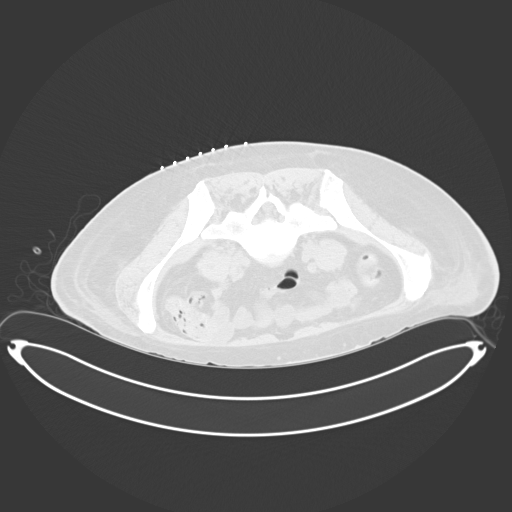
[im 13/22  mediastinal]
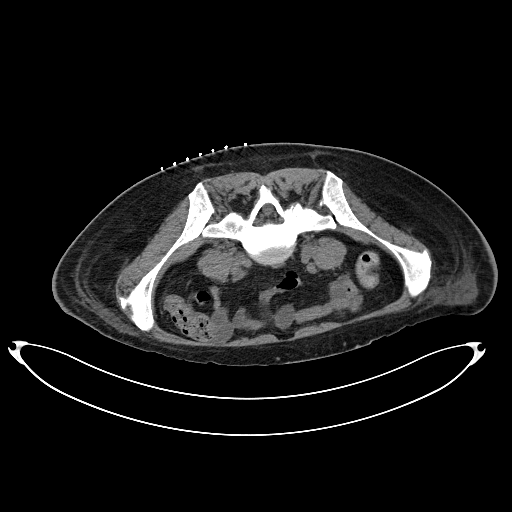
[im 13/22  lung]
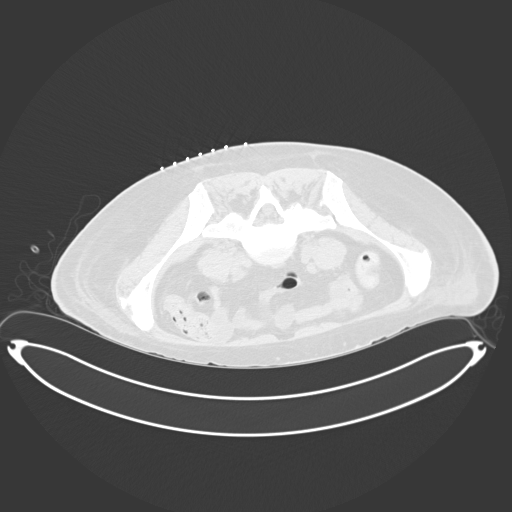
[im 14/22  lung]
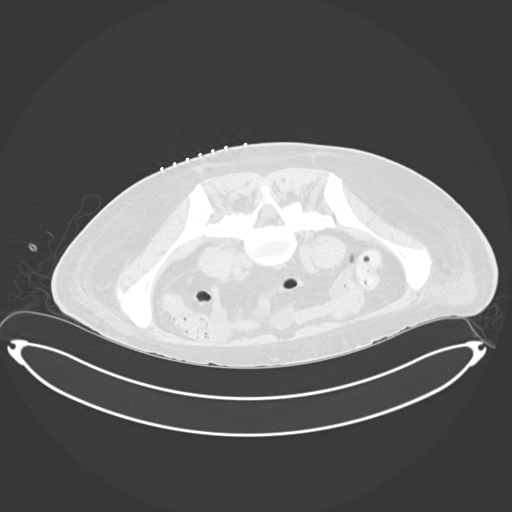
[im 16/22  lung]
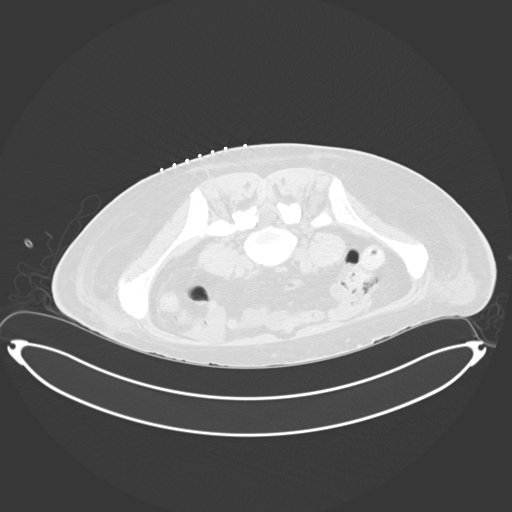
[im 17/22  lung]
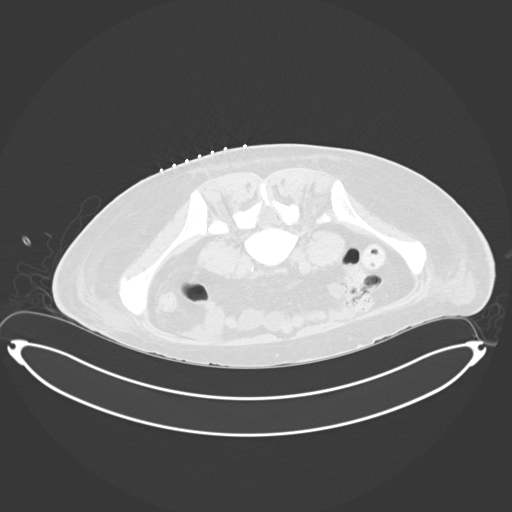
[im 18/22  mediastinal]
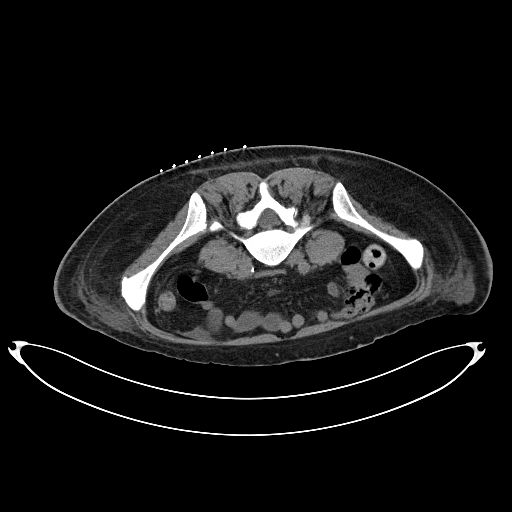
[im 18/22  lung]
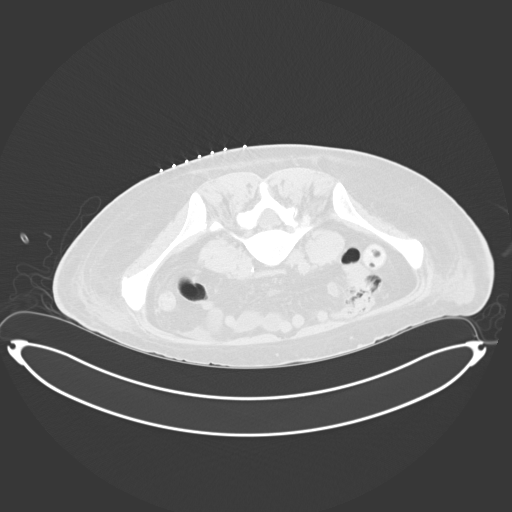
[im 20/22  lung]
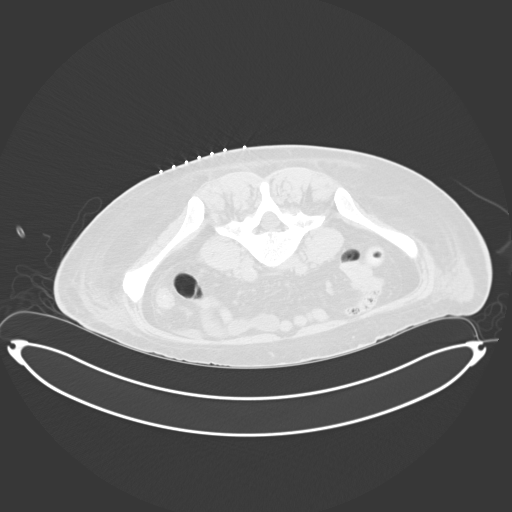
[im 21/22  lung]
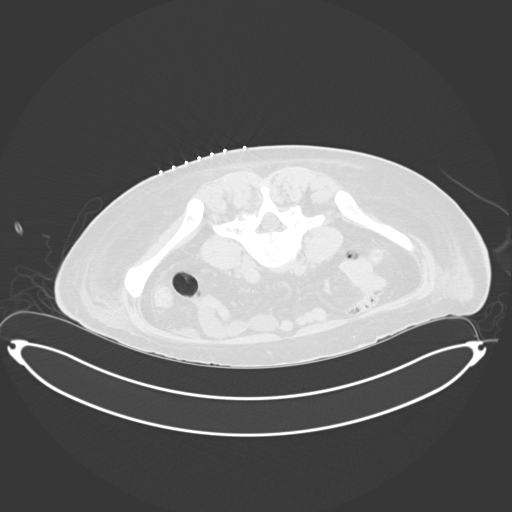

[15 of 25 positions shown; findings below may reference images not displayed]

EXAM:
CT GUIDED RIGHT ILIAC BONE MARROW ASPIRATION AND CORE BIOPSY

Radiologist:  Ceejay, Paulus N

Guidance:  CT

FLUOROSCOPY TIME:  Fluoroscopy Time: None.

MEDICATIONS:
1% lidocaine local

ANESTHESIA/SEDATION:
2.0 mg IV Versed; 50 mcg IV Fentanyl

Moderate Sedation Time:  10 minutes

The patient was continuously monitored during the procedure by the
interventional radiology nurse under my direct supervision.

CONTRAST:  None.

COMPLICATIONS:
None

PROCEDURE:
Informed consent was obtained from the patient following explanation
of the procedure, risks, benefits and alternatives. The patient
understands, agrees and consents for the procedure. All questions
were addressed. A time out was performed.

The patient was positioned prone and non-contrast localization CT
was performed of the pelvis to demonstrate the iliac marrow spaces.

Maximal barrier sterile technique utilized including caps, mask,
sterile gowns, sterile gloves, large sterile drape, hand hygiene,
and Betadine prep.

Under sterile conditions and local anesthesia, an 11 gauge coaxial
bone biopsy needle was advanced into the right iliac marrow space.
Needle position was confirmed with CT imaging. Initially, bone
marrow aspiration was performed. Next, the 11 gauge outer cannula
was utilized to obtain a right iliac bone marrow core biopsy. Needle
was removed. Hemostasis was obtained with compression. The patient
tolerated the procedure well. Samples were prepared with the
cytotechnologist. No immediate complications.
IMPRESSION: CT guided right iliac bone marrow aspiration and core biopsy.

## 2019-04-05 IMAGING — US IR FLUORO GUIDE CV LINE*L*
1 series · 1 of 1 positions shown · non-contrast
Comparison: none

INDICATION: 52-year-old female with history lymphoma

[Series 1: ir rad eval and mgt. · 1 of 1 slices shown]
[im 1/1]
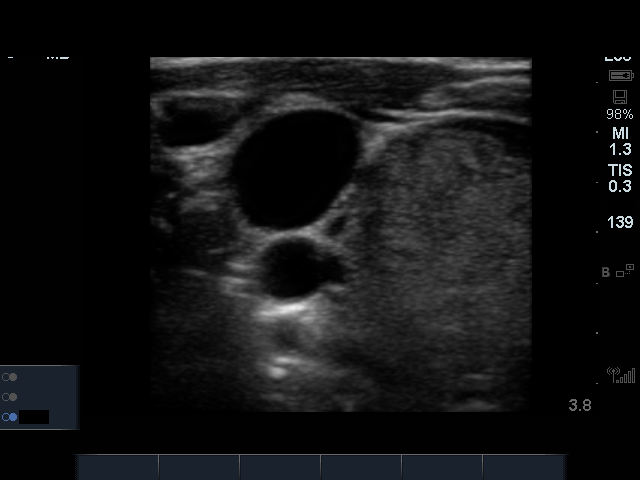

[1 of 1 positions shown; findings below may reference images not displayed]

EXAM:
IMPLANTED PORT A CATH PLACEMENT WITH ULTRASOUND AND FLUOROSCOPIC
GUIDANCE

MEDICATIONS:
2.0 g Ancef; The antibiotic was administered within an appropriate
time interval prior to skin puncture.

ANESTHESIA/SEDATION:
Moderate (conscious) sedation was employed during this procedure. A
total of Versed 2.0 mg and Fentanyl 100 mcg was administered
intravenously.

Moderate Sedation Time: 18 minutes. The patient's level of
consciousness and vital signs were monitored continuously by
radiology nursing throughout the procedure under my direct
supervision.

FLUOROSCOPY TIME:  0 minutes, 12 seconds (1.0 mGy)

COMPLICATIONS:
None

PROCEDURE:
The procedure, risks, benefits, and alternatives were explained to
the patient. Questions regarding the procedure were encouraged and
answered. The patient understands and consents to the procedure.

Ultrasound survey was performed with images stored and sent to PACs.

The right neck and chest was prepped with chlorhexidine, and draped
in the usual sterile fashion using maximum barrier technique (cap
and mask, sterile gown, sterile gloves, large sterile sheet, hand
hygiene and cutaneous antiseptic). Antibiotic prophylaxis was
provided with 2.0g Ancef administered IV one hour prior to skin
incision. Local anesthesia was attained by infiltration with 1%
lidocaine without epinephrine.

Ultrasound demonstrated patency of the right internal jugular vein,
and this was documented with an image. Under real-time ultrasound
guidance, this vein was accessed with a 21 gauge micropuncture
needle and image documentation was performed. A small dermatotomy
was made at the access site with an 11 scalpel. A 0.018" wire was
advanced into the SVC and used to estimate the length of the
internal catheter. The access needle exchanged for a 4F
micropuncture vascular sheath. The 0.018" wire was then removed and
a 0.035" wire advanced into the IVC.



The venous access site was then serially dilated and a peel away
vascular sheath placed over the wire. The wire was removed and the
port catheter advanced into position under fluoroscopic guidance.
The catheter tip is positioned in the cavoatrial junction. This was
documented with a spot image. The portacatheter was then tested and
found to flush and aspirate well. The port was flushed with saline
followed by 100 units/mL heparinized saline.

The pocket was then closed in two layers using first subdermal
inverted interrupted absorbable sutures followed by a running
subcuticular suture. The epidermis was then sealed with Dermabond.
The dermatotomy at the venous access site was also seal with
Dermabond.

Patient tolerated the procedure well and remained hemodynamically
stable throughout.

No complications encountered and no significant blood loss
encountered
IMPRESSION: Status post right IJ port catheter placement. Catheter ready for
use.

## 2019-04-05 IMAGING — CT CT NECK W/ CM
3 of 5 series · 9 of 35 positions shown, 11 images · IV contrast (omnipaque)
Comparison: None.

CLINICAL DATA: Initial evaluation for Hodgkin's lymphoma, initial
workup.

EXAM:
CT NECK WITH CONTRAST
CT CHEST, ABDOMEN, AND PELVIS WITH CONTRAST
TECHNIQUE: Multidetector CT imaging of the chest, abdomen and pelvis was
performed following the standard protocol during bolus
administration of intravenous contrast.
CONTRAST:  100mL OMNIPAQUE IOHEXOL 300 MG/ML SOLN, 30mL OMNIPAQUE
IOHEXOL 300 MG/ML SOLN

[Series 3: axial neck · axial · 0.39mm/px · z∈[-338,-338]mm · 1 of 117 slices shown, 2 images]
[im 59/117  soft-tissue]
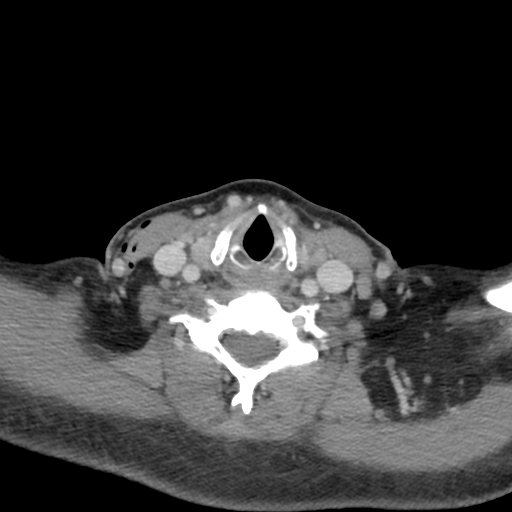
[im 59/117  bone]
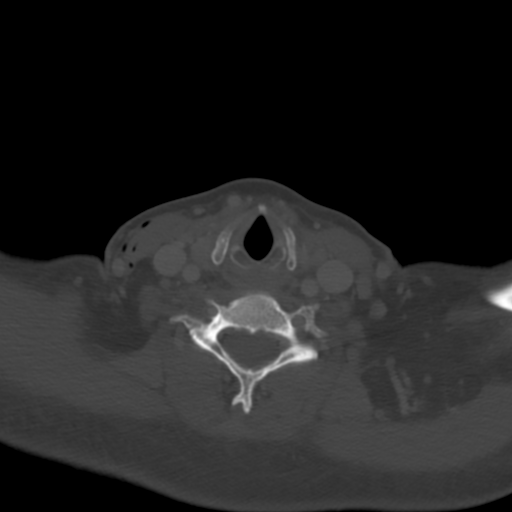

[Series 8: cor neck · coronal · 0.31mm/px · 3 of 101 slices shown]
[im 21/101  bone]
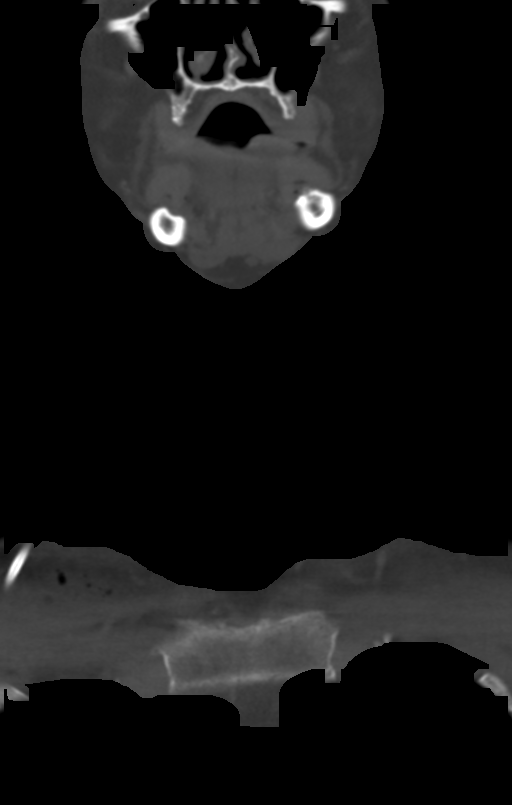
[im 41/101  bone]
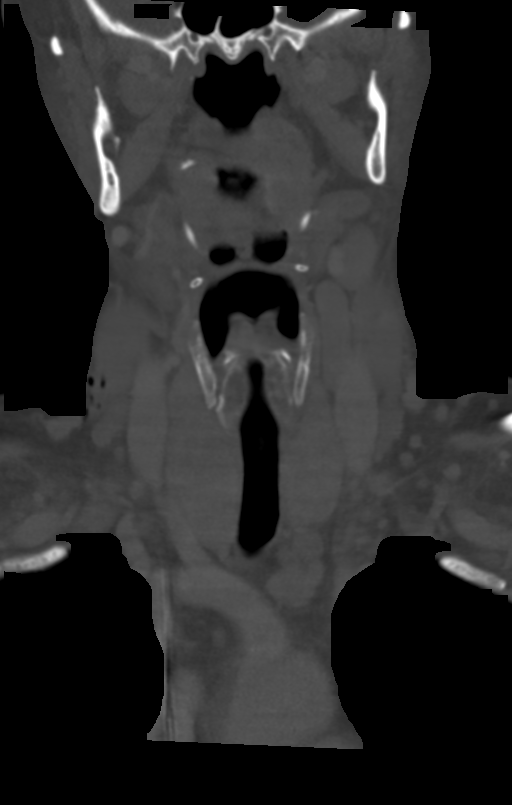
[im 61/101  bone]
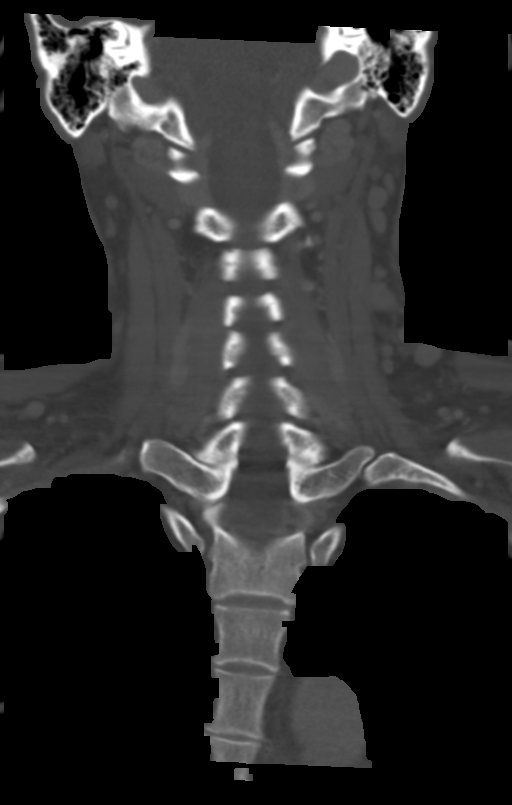

[Series 9: sag neck · sagittal · 0.33mm/px · 5 of 79 slices shown, 6 images]
[im 27/79  bone]
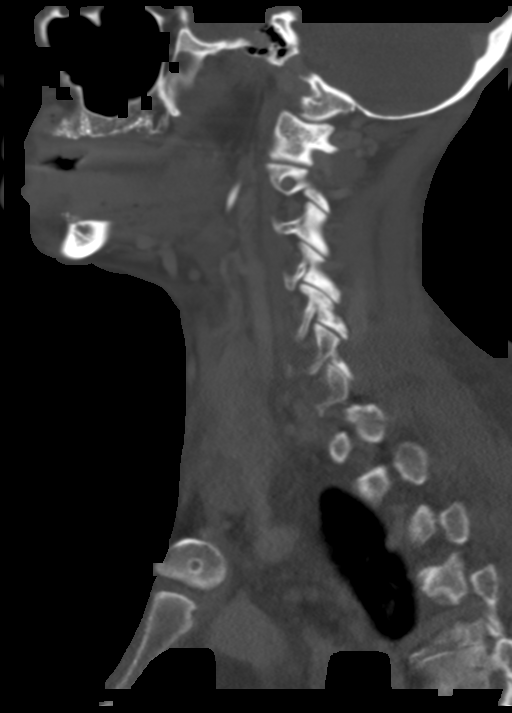
[im 33/79  bone]
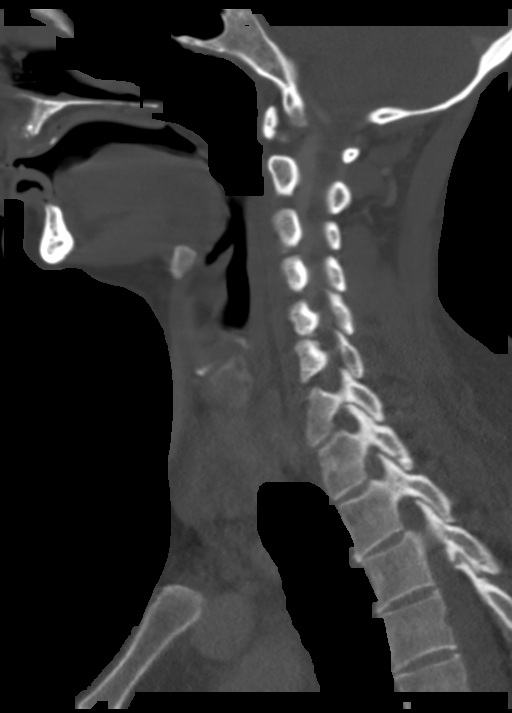
[im 40/79  soft-tissue]
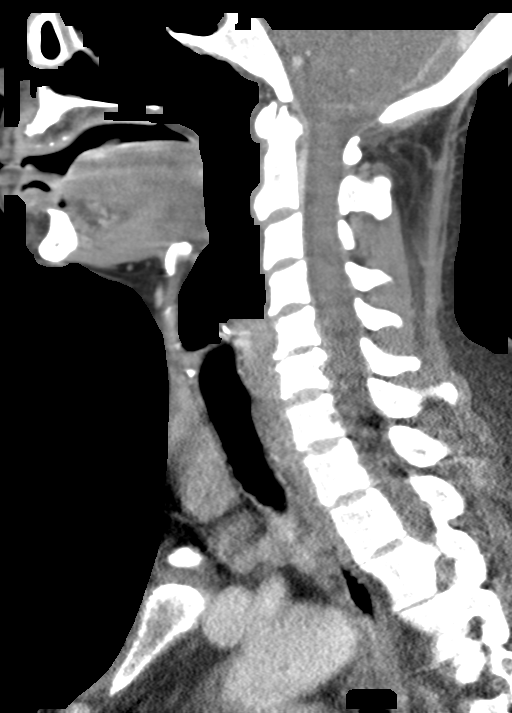
[im 40/79  bone]
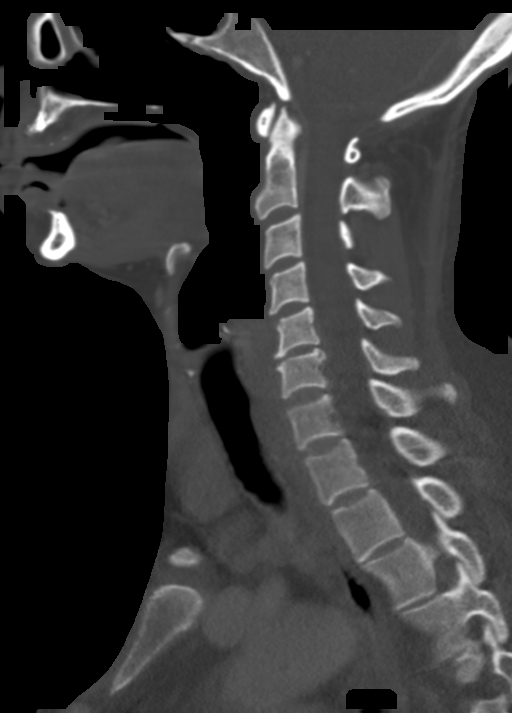
[im 46/79  bone]
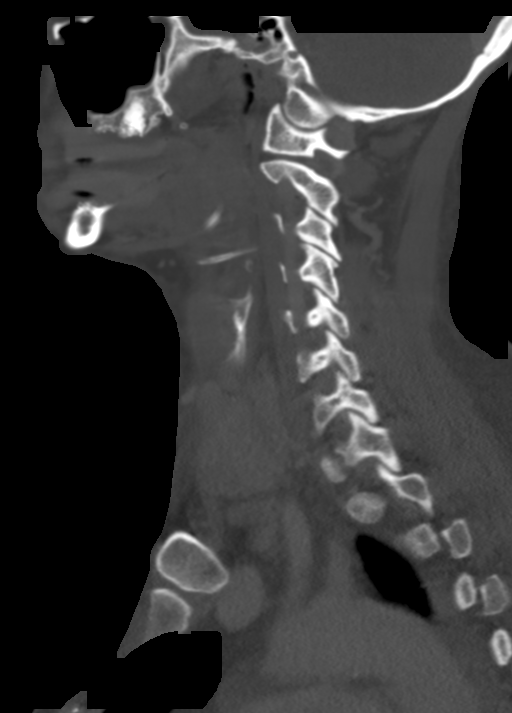
[im 53/79  bone]
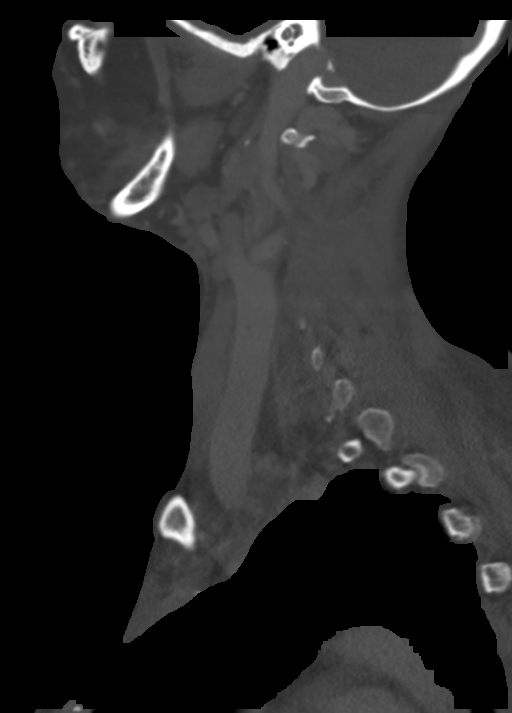

[9 of 35 positions shown; findings below may reference images not displayed]

FINDINGS: CT NECK FINDINGS:

Pharynx and larynx: Oral cavity within normal limits without mass
lesion or loculated collection. Patient is largely edentulous.
Calcified tonsilliths noted within the right palatine tonsil.
Tonsils themselves symmetric and within normal limits bilaterally.
Parapharyngeal fat maintained. Nasopharynx normal. No
retropharyngeal collection. Epiglottis normal. Vallecula clear.
Remainder of the hypopharynx and supraglottic larynx normal. Glottis
within normal limits. Subglottic airway clear.

Salivary glands: Parotid and submandibular glands within normal
limits.

Thyroid: Thyroid diffusely enlarged without discrete nodule or mass.

Lymph nodes: Mildly prominent level II lymph nodes measure up to 9
mm in short axis bilaterally. Left level III nodes measure up to 9
mm as well. Increased number of shotty subcentimeter nodes within
the neck bilaterally, left slightly worse than right. No other
pathologically enlarged lymph nodes identified within the neck.

Vascular: Normal intravascular enhancement seen throughout the neck.
Right IJ approach central venous catheter in place. Soft tissue
swelling with stranding and scattered foci of soft tissue emphysema
within the right neck likely related to central line placement.

Limited intracranial: Unremarkable

Visualized orbits: Partially visualized inferior globes and orbits
unremarkable.

Mastoids and visualized paranasal sinuses: Mucosal thickening noted
within the visualized maxillary and sphenoid sinuses. Visualized
paranasal sinuses are otherwise clear. Visualize mastoids and middle
ear cavities are clear.

Skeleton: No acute osseous abnormality. No discrete lytic or blastic
osseous lesions. Mild cervical spondylolysis present at C5-6.

Other: None.

CT CHEST FINDINGS

Cardiovascular: Normal intravascular enhancement seen throughout the
intra-abdominal aorta without aneurysm or other acute abnormality.
Minimal atheromatous plaque within the aortic arch. Visualized great
vessels within normal limits. Heart size normal. Small moderate
pericardial effusion, simple fluid density.

Mediastinum/Nodes: Enlarged nodes within the upper mediastinum
measure up to 15 mm in short axis (series 3, image 11). No
appreciable supraclavicular adenopathy. No other pathologically
enlarged mediastinal lymph nodes. Mild soft tissue density within
the right hilum without frank adenopathy. Mildly enlarged 11 mm left
hilar node (series 3, image 28). Enlarged axillary adenopathy seen
bilaterally, with the largest node on the left measuring 2.1 cm in
short axis (series 3, image 16). Largest node on the right measures
1.7 cm in short axis (series 3, image 8). Scattered soft tissue
stranding with emphysema and fluid density overlying the left axilla
likely related to recent lymph node biopsy, partially visualized.
Esophagus within normal limits.

Lungs/Pleura: Tracheobronchial tree intact and patent. Moderate
layering bilateral pleural effusions with associated atelectasis. No
other airspace consolidation. No pulmonary edema. No pneumothorax. 4
mm subpleural ground-glass nodule present at the anterior right
upper lobe (series 4, image 57), indeterminate. No other worrisome
pulmonary nodule or mass.

Musculoskeletal: No acute osseous finding. No discrete lytic or
blastic osseous lesions.

CT ABDOMEN PELVIS FINDINGS

Hepatobiliary: Liver demonstrates a normal contrast enhanced
appearance. Gallbladder within normal limits. No biliary dilatation.

Pancreas: Pancreas within normal limits.

Spleen: Spleen enlarged measuring 14.3 cm in craniocaudad dimension.
Few subtle hypodensities noted within the spleen, largest of which
measures approximately 12 mm (series 3, image 57), indeterminate.

Adrenals/Urinary Tract: Adrenal glands are normal. Kidneys equal in
size with symmetric enhancement. No nephrolithiasis, hydronephrosis,
or focal enhancing renal mass. No hydroureter. Bladder within normal
limits.

Stomach/Bowel: Stomach within normal limits. No evidence for bowel
obstruction. Normal appendix. No acute inflammatory changes seen
about the bowels.

Vascular/Lymphatic: Normal intravascular enhancement seen throughout
the intra-abdominal aorta. Mild aorto bi-iliac atherosclerotic
disease. No aneurysm. Mesenteric vessels patent proximally.

Enlarged 15 mm aortocaval lymph node (series 3, image 7). Additional
multifocal shotty subcentimeter aortocaval and periaortic lymph
nodes noted. No other pathologically enlarged intra-abdominal lymph
nodes identified. Mildly prominent 12 mm left inguinal lymph nodes
noted. No other adenopathy within the pelvis.

Reproductive: Uterus within normal limits.  Ovaries normal.

Other: Moderate volume free fluid within the pelvis, measuring
simple fluid density. No free intraperitoneal air.

Musculoskeletal: Scattered anasarca noted within the external soft
tissues. No acute osseous finding. No discrete lytic or blastic
osseous lesions.
IMPRESSION: CT NECK IMPRESSION

1. Increased number of shoddy subcentimeter lymph nodes throughout
the neck bilaterally, left slightly worse than right. No
pathologically enlarged lymph nodes identified.
2. Mild diffuse thyromegaly without discrete thyroid nodule or mass.
3. Mild soft tissue stranding with emphysema within the right
lateral neck related to recent right-sided Port-A-Cath placement.

CT CHEST IMPRESSION

1. Enlarged bilateral axillary and upper mediastinal adenopathy as
above, likely related to provided history of lymphoma. Additional
borderline enlarged left hilar node may be related to lymphoma or
possibly reactive in nature. Attention at follow-up recommended.
2. Moderate layering bilateral pleural effusions with associated
atelectasis.
3. Small to moderate pericardial effusion.
4. 4 mm right upper lobe ground-glass nodule, indeterminate.
Attention at follow-up recommended.

CT ABDOMEN AND PELVIS IMPRESSION

1. Enlarged 15 mm aortocaval lymph node with mildly enlarged 12 mm
left inguinal lymph nodes. Additional increased number of shotty
subcentimeter retroperitoneal lymph nodes. Findings most likely
related to history of lymphoma.
2. Splenomegaly.
3. Moderate volume free fluid within the pelvis, of uncertain
etiology, but could be physiologic and/or related overall volume
status.
4. Mild diffuse anasarca.

## 2019-04-08 IMAGING — DX DG CHEST 2V
2 series · 2 of 2 positions shown · non-contrast
Comparison: Chest CT, 11/18/2018.  Chest radiographs, 11/11/2018.

CLINICAL DATA: History of lymphoma.  Patient weak and flushed.

EXAM:
CHEST - 2 VIEW

[chest lat]
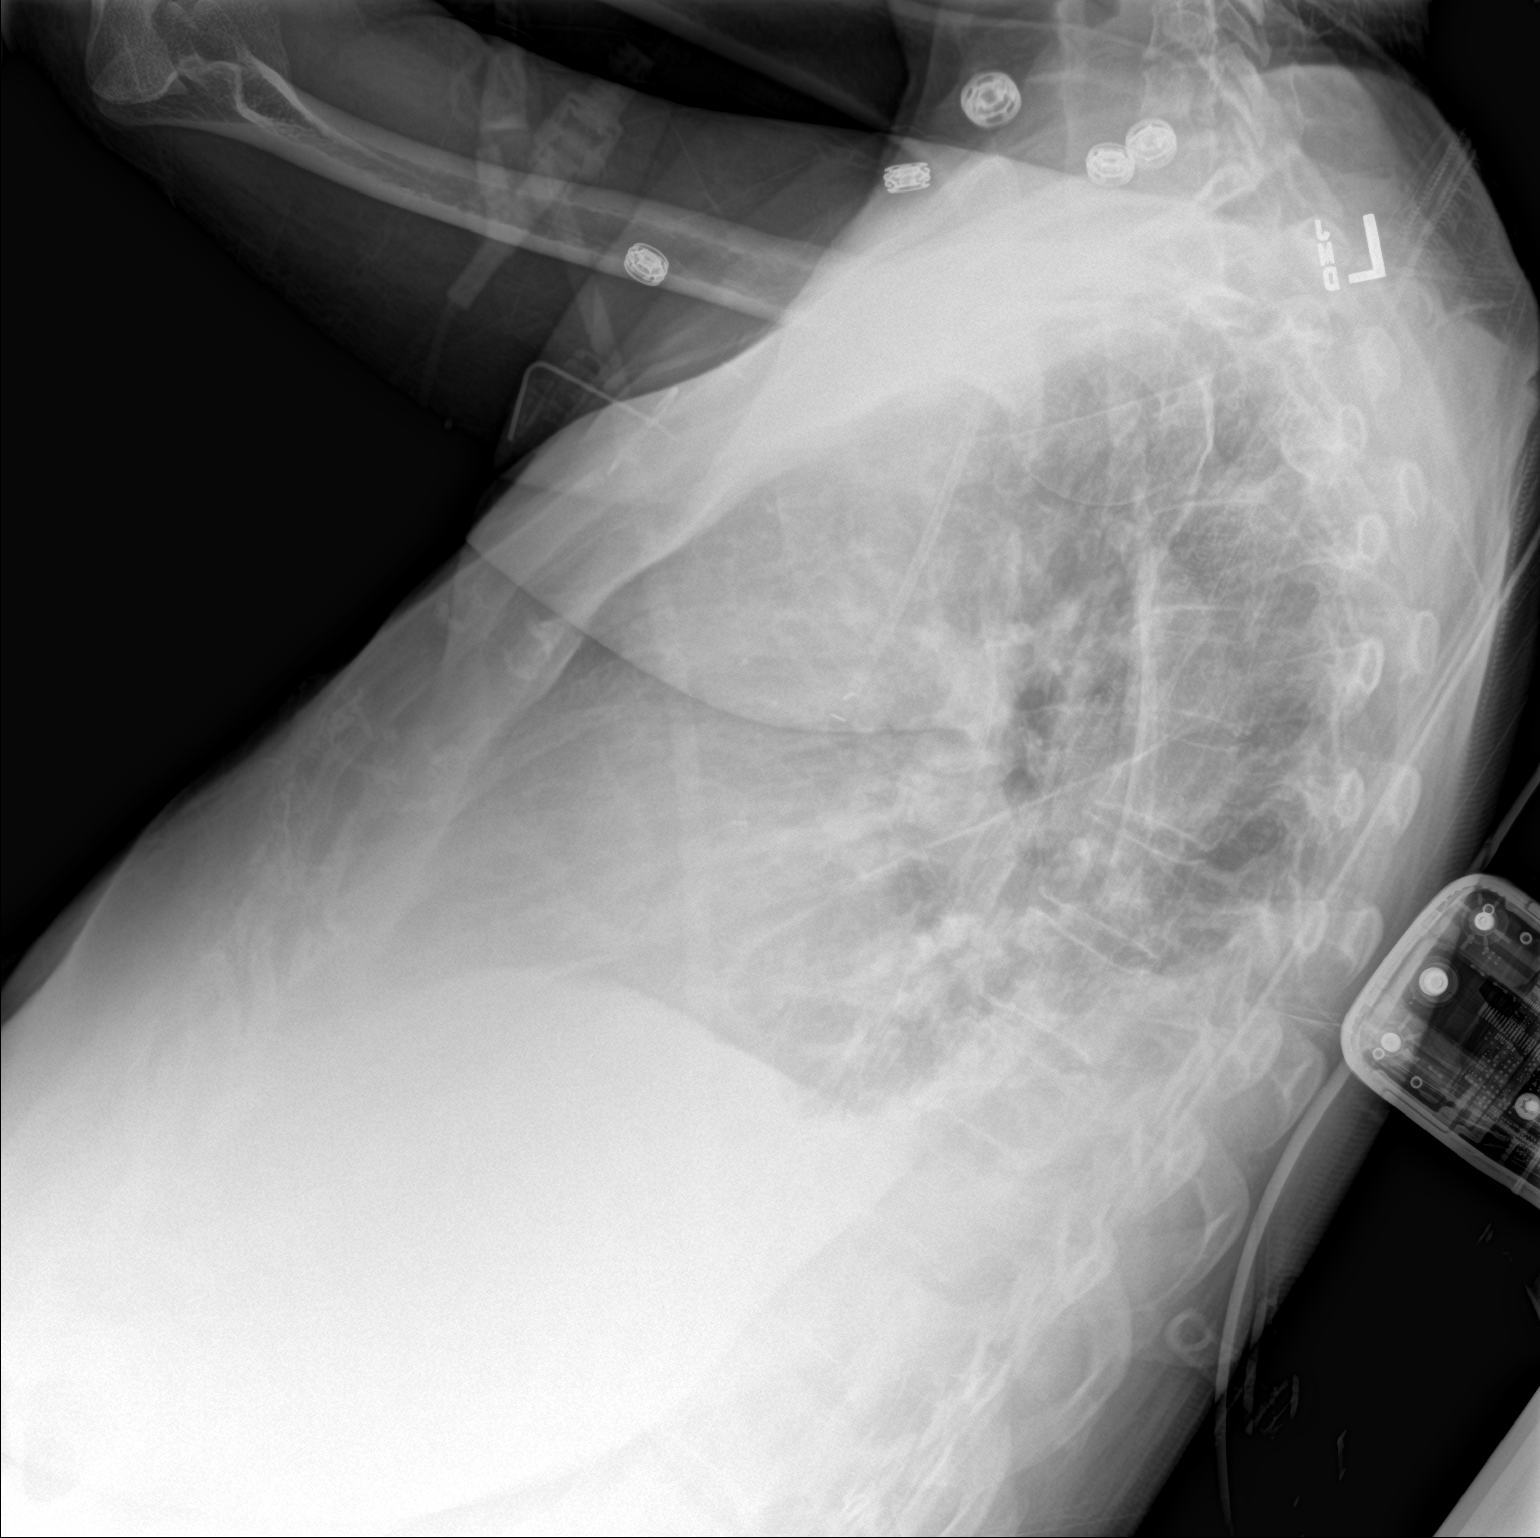

[chest ap]
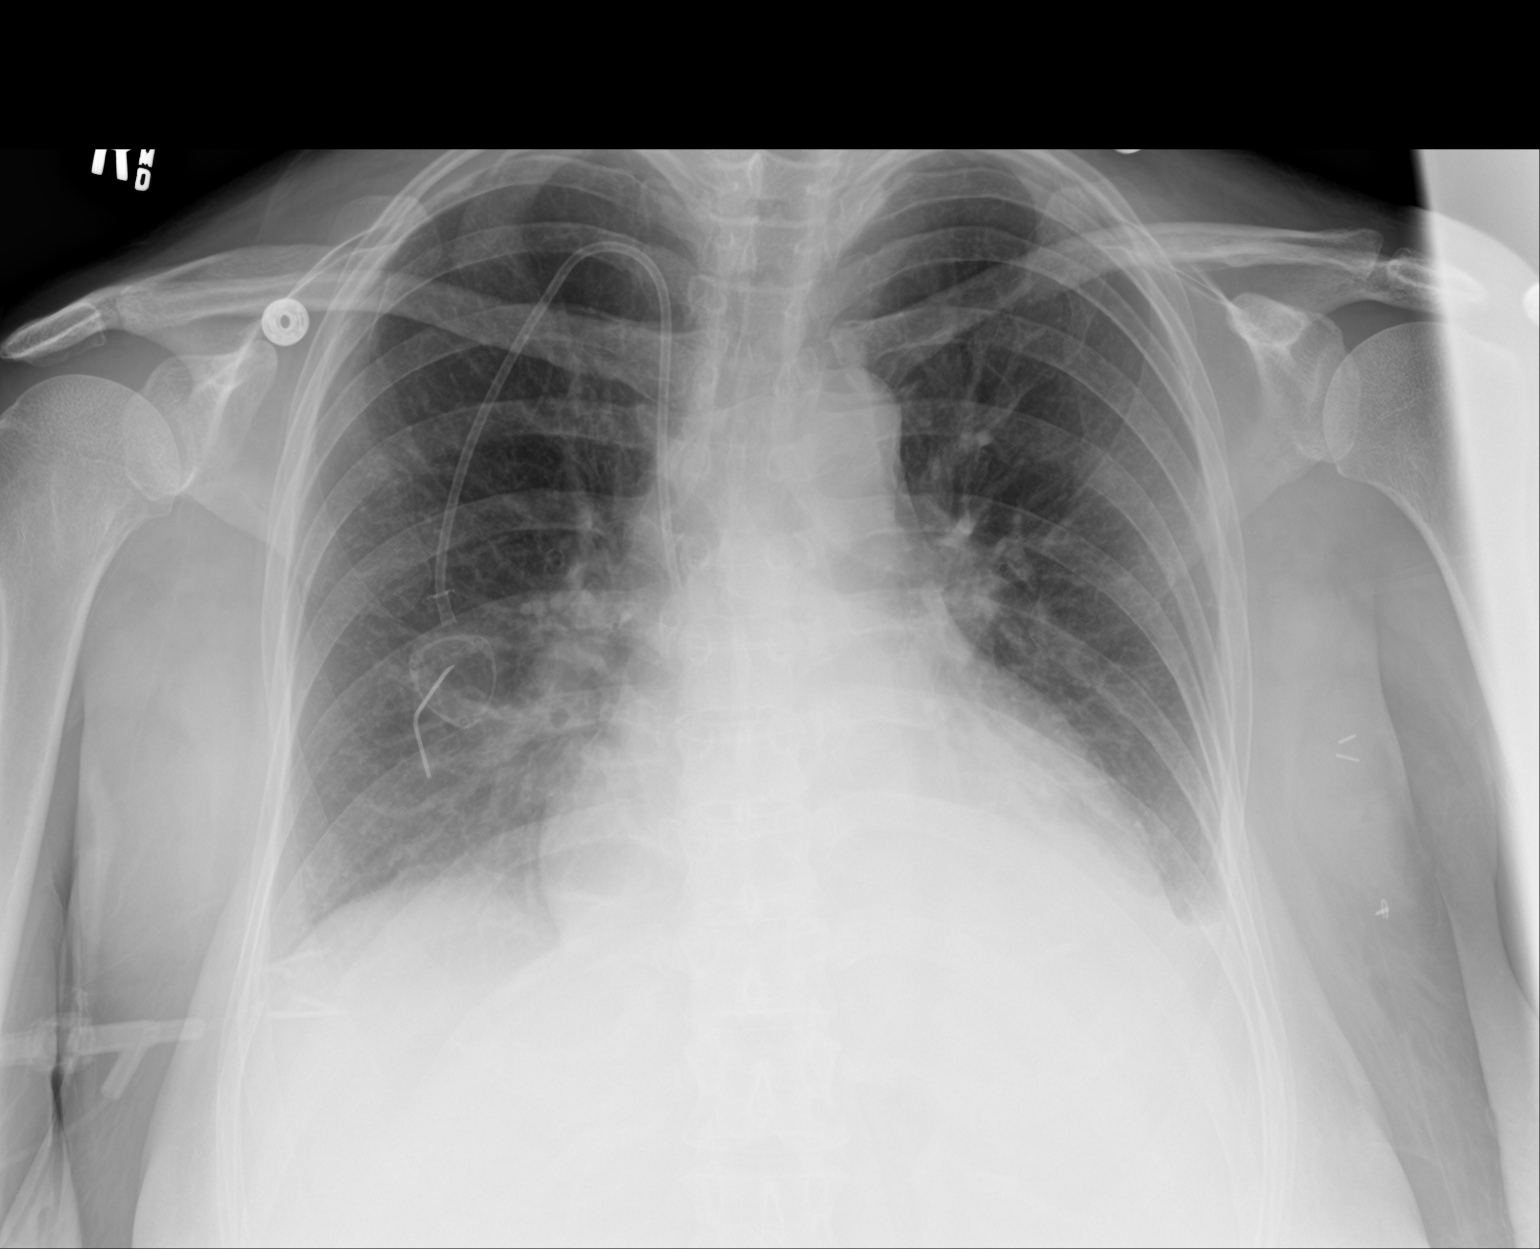

[2 of 2 positions shown; findings below may reference images not displayed]

FINDINGS: Mild enlargement of the cardiopericardial silhouette. No mediastinal
or hilar masses. No convincing adenopathy.

Small bilateral pleural effusions. Mild dependent lower lobe
atelectasis. Lungs otherwise clear.

No pneumothorax.

Right anterior chest wall, internal jugular, Port-A-Cath is stable
from the prior chest CT.

Skeletal structures are unremarkable.
IMPRESSION: 1. Findings are similar to the recent prior chest CT. Enlargement of
the cardiopericardial silhouette is consistent with the pericardial
effusion noted on CT. There are small bilateral pleural effusions
with associated lower lobe atelectasis. No convincing pneumonia and
no pulmonary edema.

## 2019-04-21 IMAGING — US US PELVIS COMPLETE
1 series · 14 of 25 positions shown · non-contrast
Comparison: CT, 11/27/2018

CLINICAL DATA: Vaginal bleeding.

EXAM:
TRANSABDOMINAL ULTRASOUND OF PELVIS
TECHNIQUE: Transabdominal ultrasound examination of the pelvis was performed
including evaluation of the uterus, ovaries, adnexal regions, and
pelvic cul-de-sac.

[Series 1: us pelvis complete · 14 of 57 slices shown]
[im 1/57]
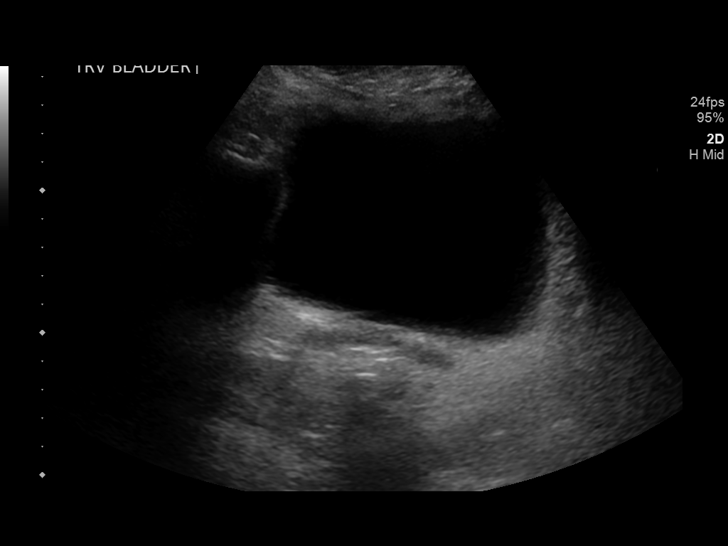
[im 5/57]
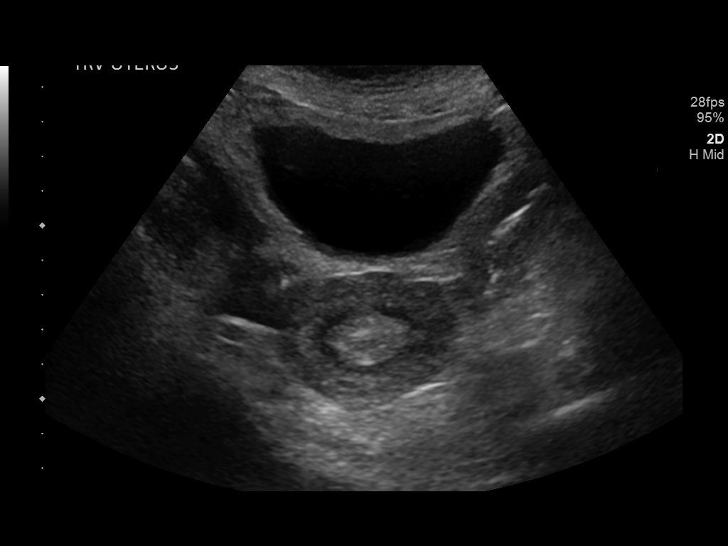
[im 10/57]
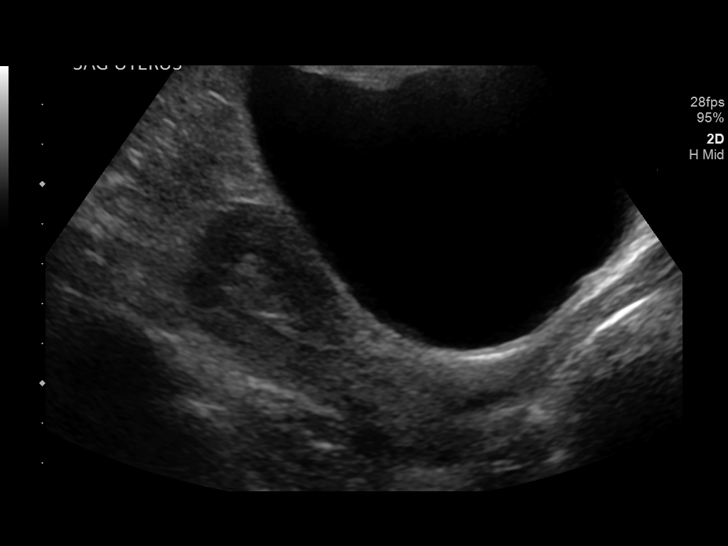
[im 15/57]
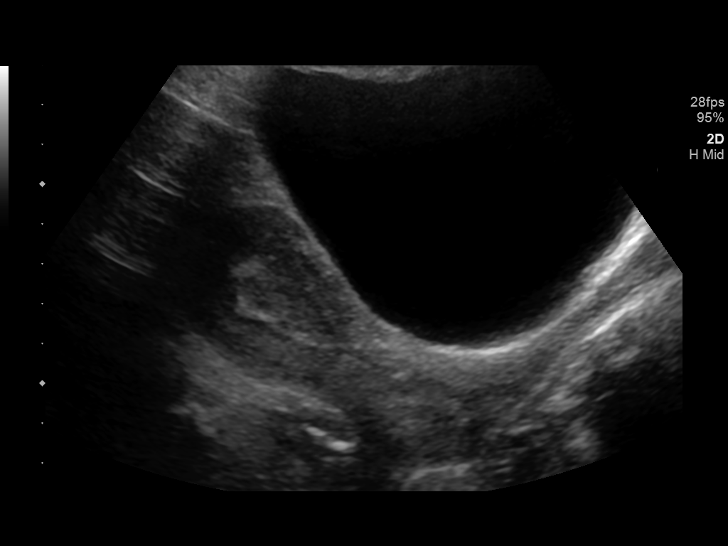
[im 19/57]
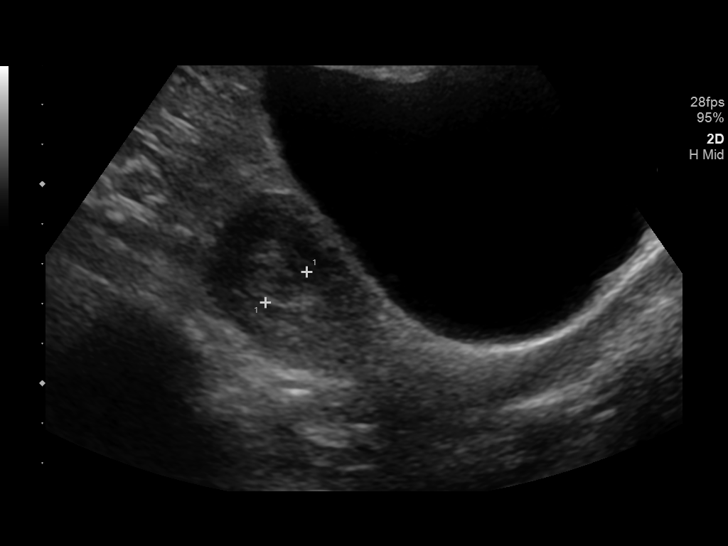
[im 22/57]
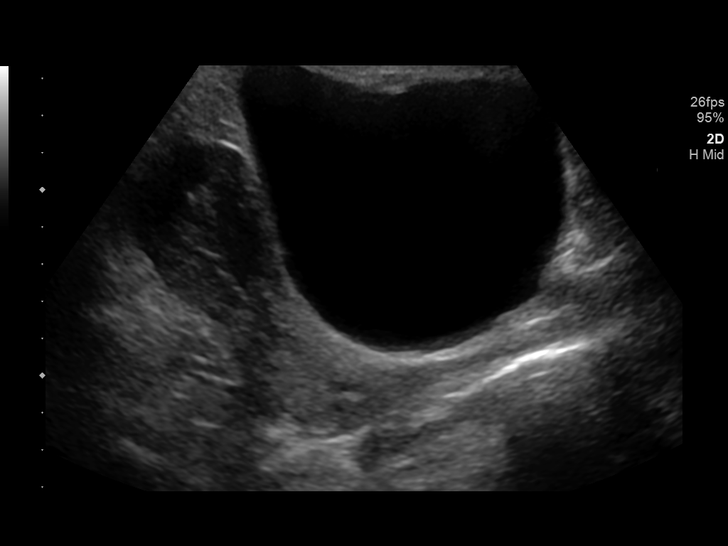
[im 26/57]
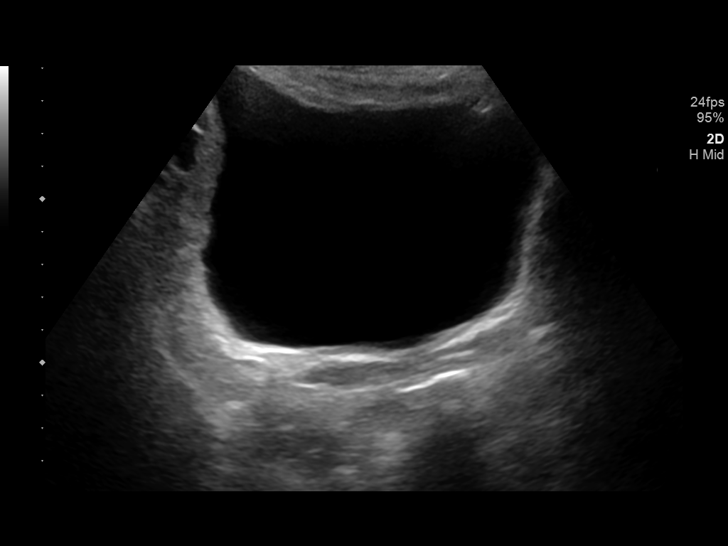
[im 31/57]
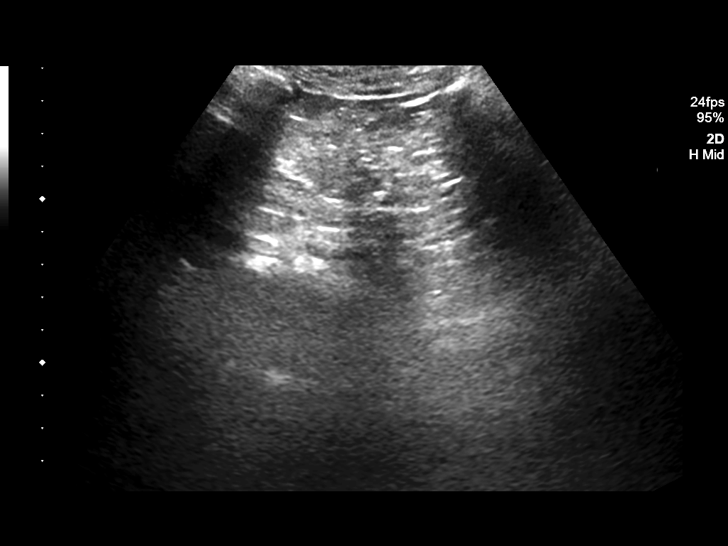
[im 36/57]
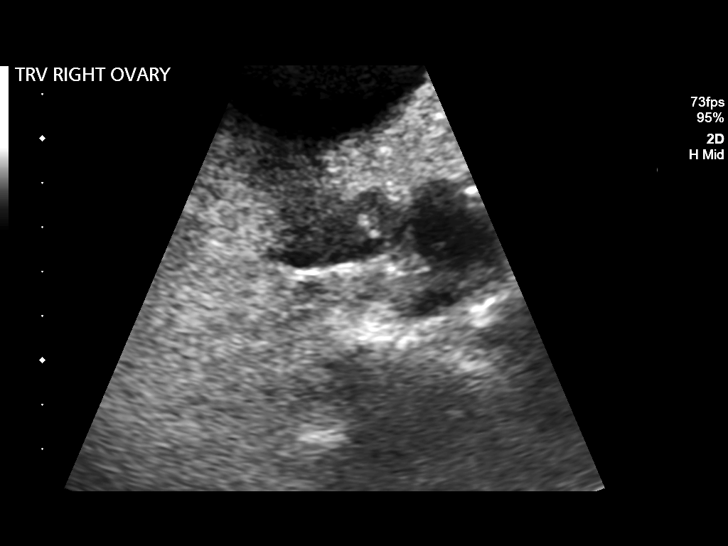
[im 38/57]
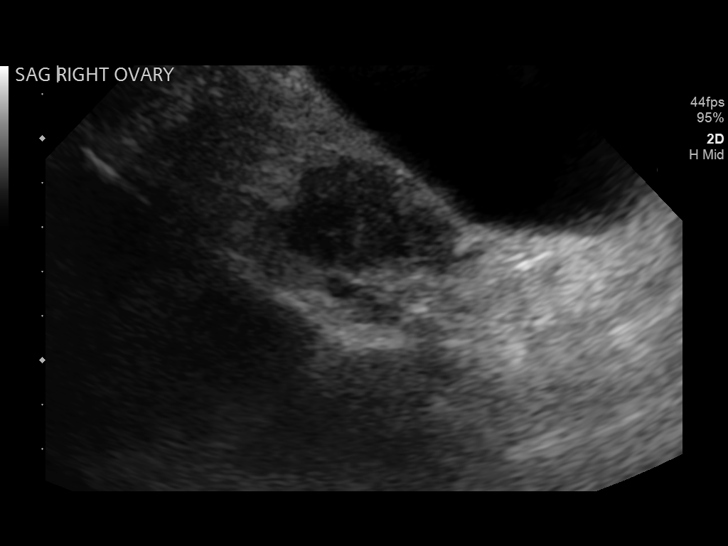
[im 43/57]
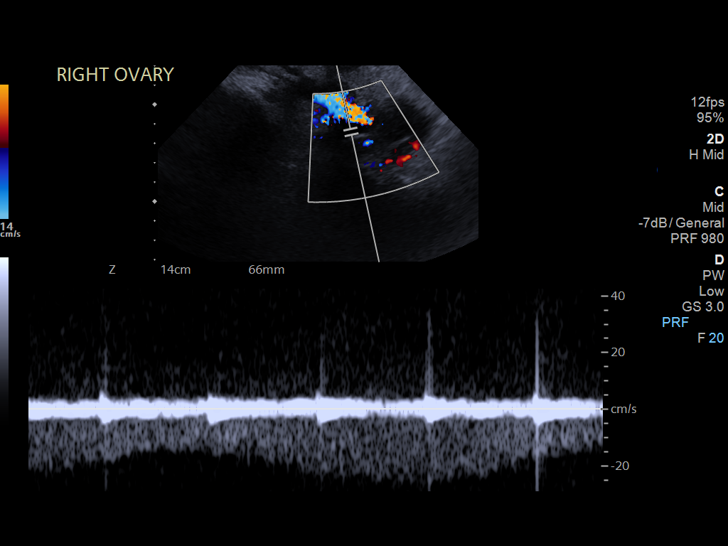
[im 47/57]
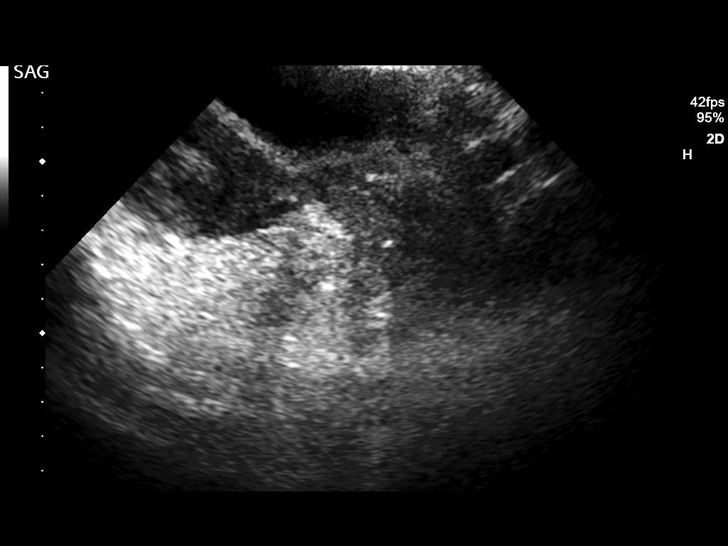
[im 52/57]
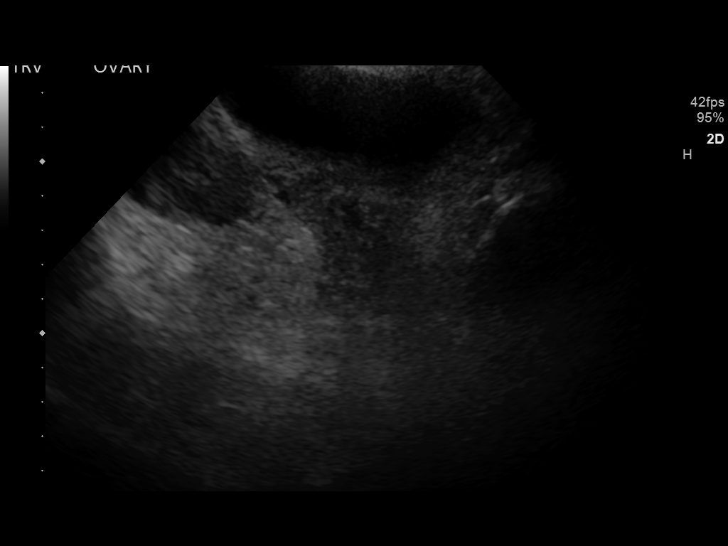
[im 57/57]
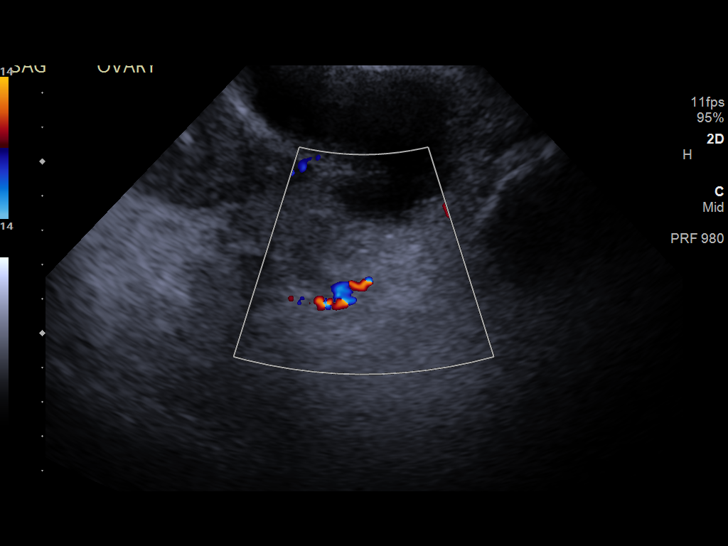

[14 of 25 positions shown; findings below may reference images not displayed]

FINDINGS: Uterus

Measurements: 9.4 x 3.9 x 6.2 cm = volume: 117 mL. No fibroids or
other mass visualized.

Endometrium

Thickness: 13 mm.  No focal abnormality visualized.

Right ovary

Measurements: 4.5 x 2.6 x 3.4 cm = volume: 29 mL. Prominent, but
otherwise unremarkable. Normal vascular flow noted on Doppler
analysis. No adnexal masses.

Left ovary

Not definitively seen.  No left adnexal masses.

Other findings:  No abnormal free fluid.
IMPRESSION: 1. No acute findings.
2. No endometrial masses or thickening.
3. Left ovary not visualized.  No adnexal masses.

## 2019-04-23 IMAGING — DX PORTABLE CHEST - 1 VIEW
1 series · 1 of 1 positions shown · non-contrast
Comparison: Prior chest x-ray 11/26/2018

CLINICAL DATA: 52-year-old female with fever, not responding to
questions

EXAM:
PORTABLE CHEST 1 VIEW

[chest ap]
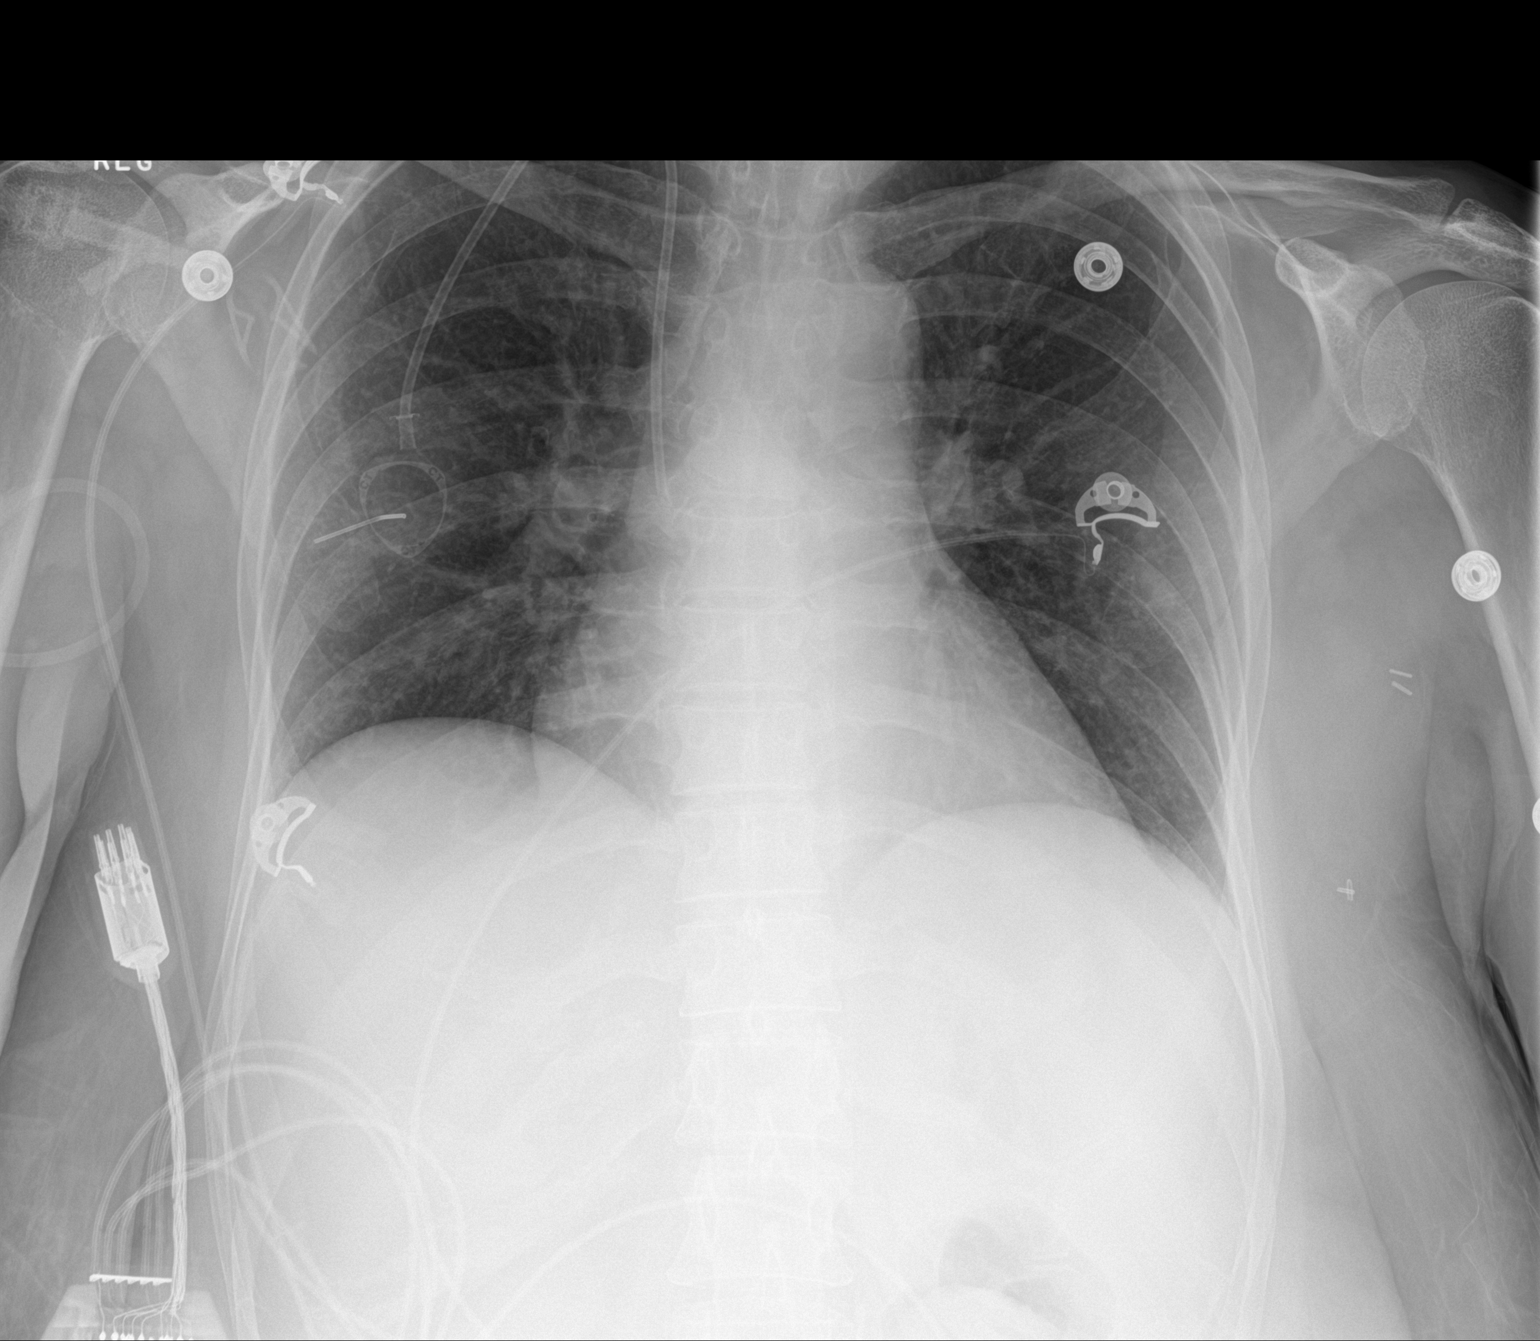

[1 of 1 positions shown; findings below may reference images not displayed]

FINDINGS: Right IJ approach single-lumen power injectable port catheter. The
catheter tip overlies the mid SVC. The lungs are clear and negative
for focal airspace consolidation, pulmonary edema or suspicious
pulmonary nodule. No pleural effusion or pneumothorax. Cardiac and
mediastinal contours are within normal limits. No acute fracture or
lytic or blastic osseous lesions. The visualized upper abdominal
bowel gas pattern is unremarkable.
IMPRESSION: No active disease.

## 2019-04-23 IMAGING — CT CT HEAD WITHOUT CONTRAST
3 series · 16 of 45 positions shown, 19 images · non-contrast
Comparison: Head CT scan 11/25/2018.

CLINICAL DATA: Confusion, altered mental status.

EXAM:
CT HEAD WITHOUT CONTRAST
TECHNIQUE: Contiguous axial images were obtained from the base of the skull
through the vertex without intravenous contrast.

[Series 2: head wo · axial · 0.39mm/px · z∈[-59,+56]mm · 10 of 28 slices shown, 13 images]
[im 3/28  brain]
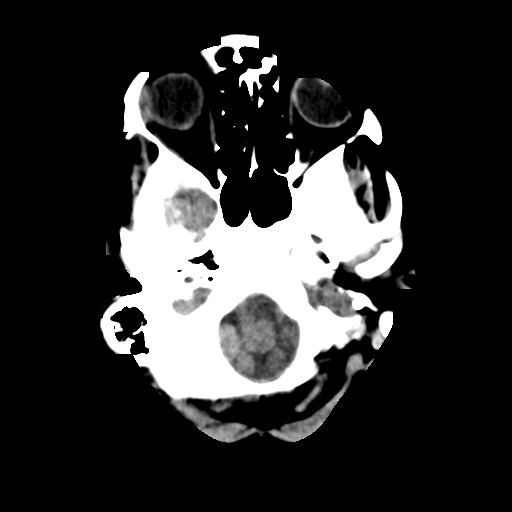
[im 3/28  bone]
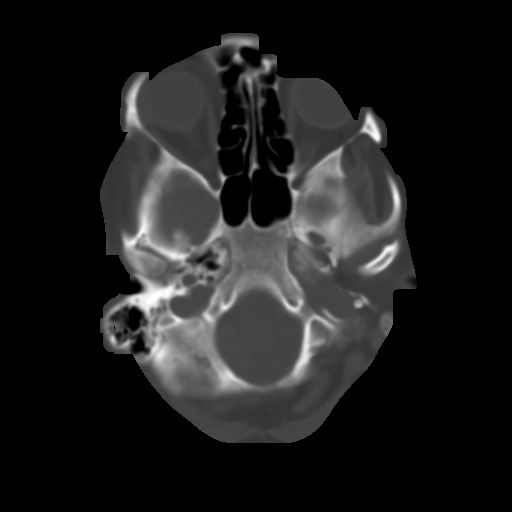
[im 5/28  brain]
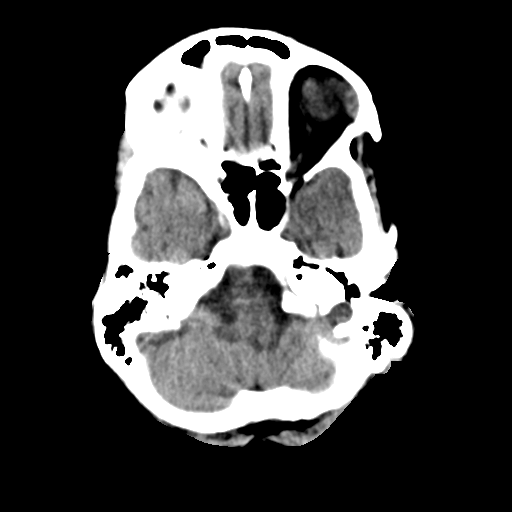
[im 8/28  brain]
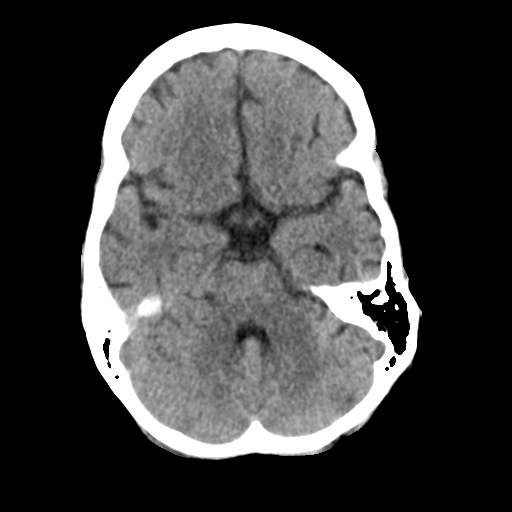
[im 11/28  brain]
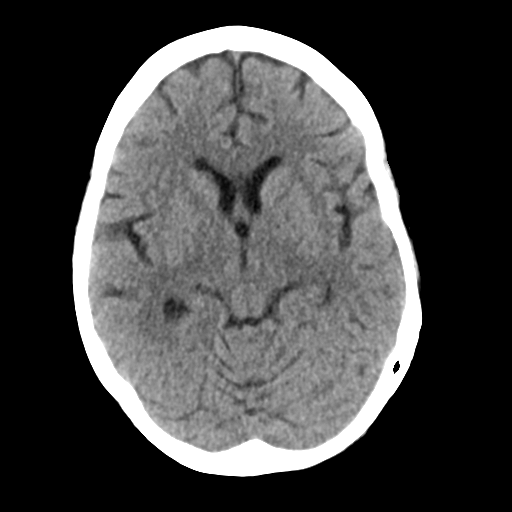
[im 13/28  brain]
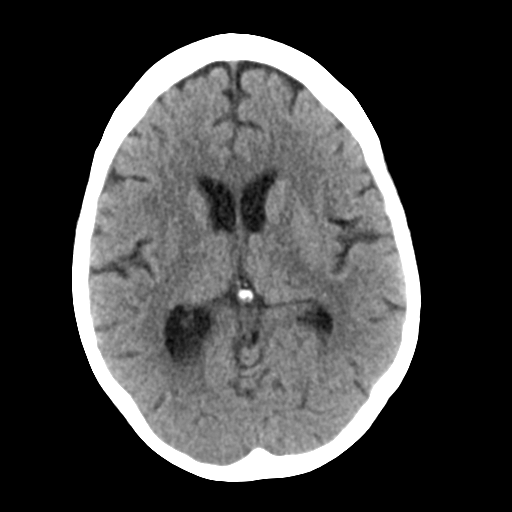
[im 13/28  bone]
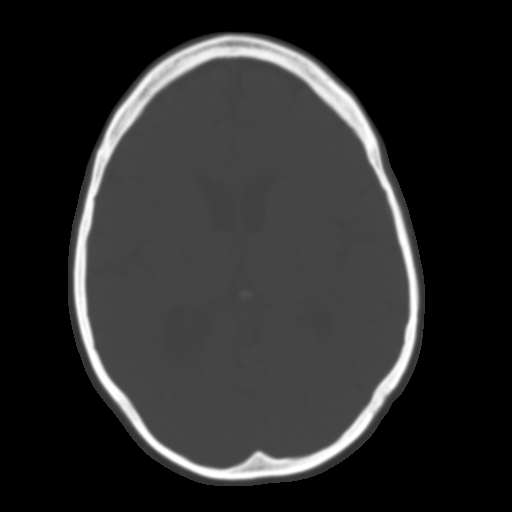
[im 16/28  brain]
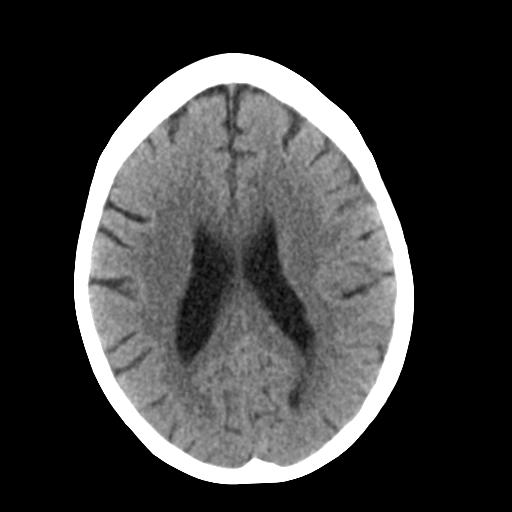
[im 18/28  brain]
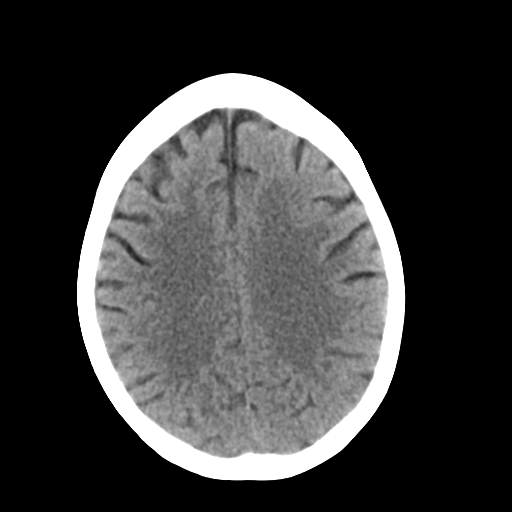
[im 21/28  brain]
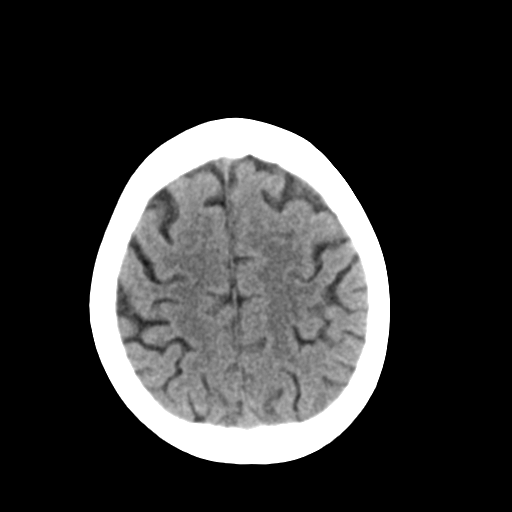
[im 24/28  brain]
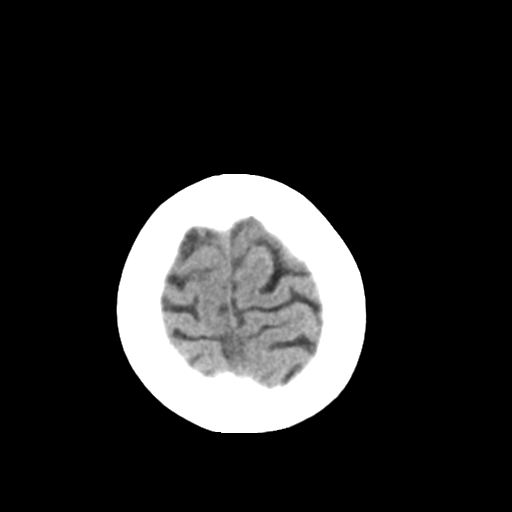
[im 24/28  bone]
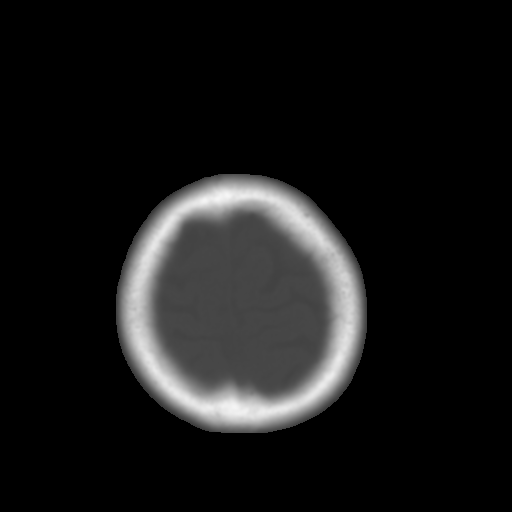
[im 26/28  brain]
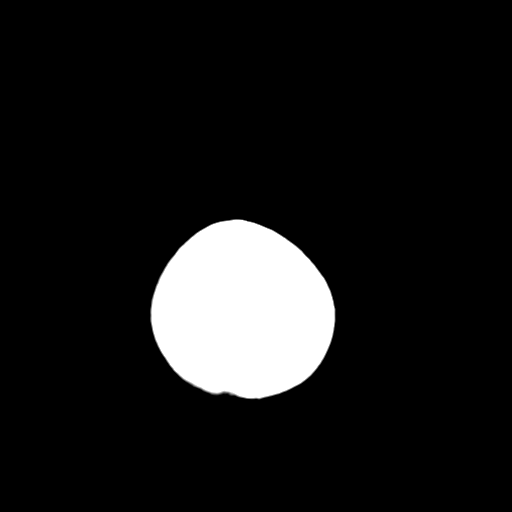

[Series 4: coronal soft tissue · coronal · 0.28mm/px · 3 of 61 slices shown]
[im 21/61  brain]
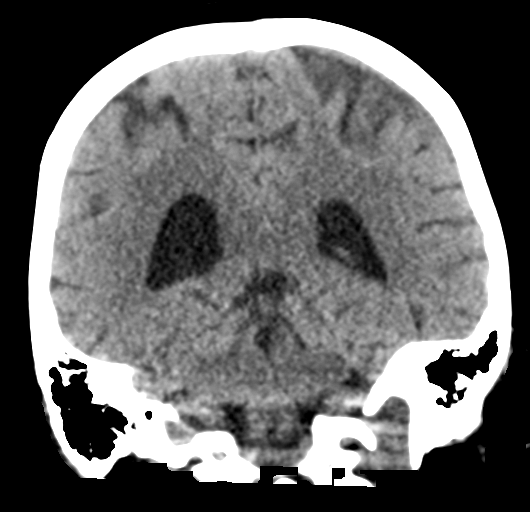
[im 27/61  brain]
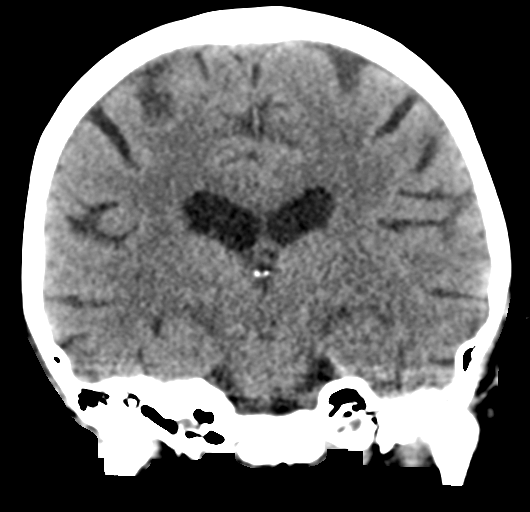
[im 34/61  brain]
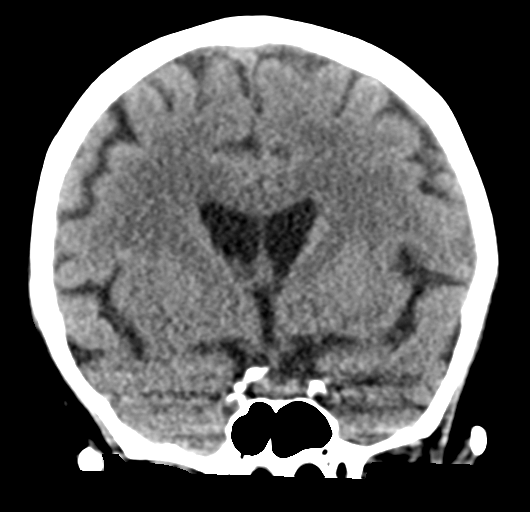

[Series 5: sagittal soft tissue · sagittal · 0.28mm/px · 3 of 48 slices shown]
[im 16/48  brain]
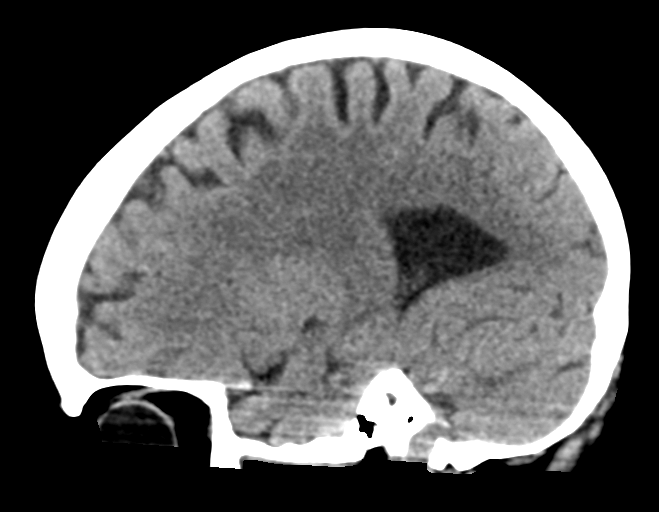
[im 24/48  brain]
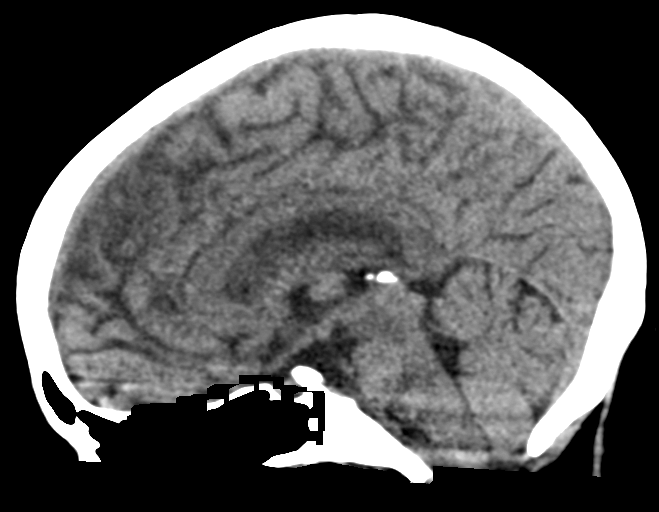
[im 32/48  brain]
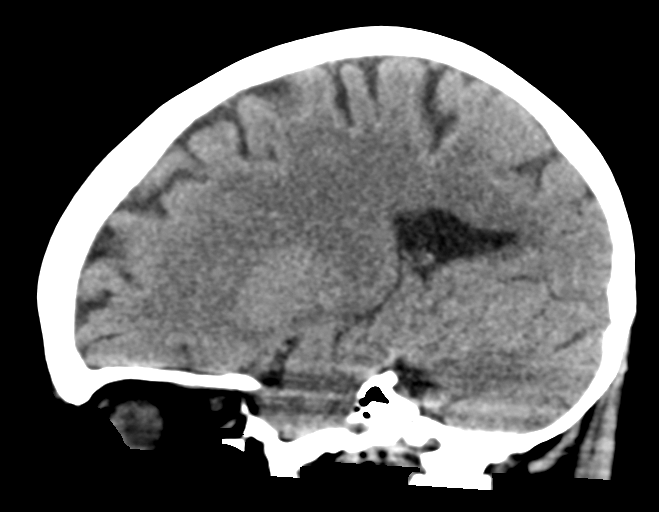

[16 of 45 positions shown; findings below may reference images not displayed]

FINDINGS: Brain: No evidence of acute infarction, hemorrhage, hydrocephalus,
extra-axial collection or mass lesion/mass effect.

Vascular: No hyperdense vessel or unexpected calcification.

Skull: No fracture or focal lesion.

Sinuses/Orbits: Negative.

Other: None.
IMPRESSION: Normal head CT.

## 2019-05-22 IMAGING — DX PORTABLE CHEST - 1 VIEW
1 series · 1 of 1 positions shown · non-contrast
Comparison: 12/06/2018 chest radiograph.

CLINICAL DATA: Dyspnea

EXAM:
PORTABLE CHEST 1 VIEW

[chest ap]
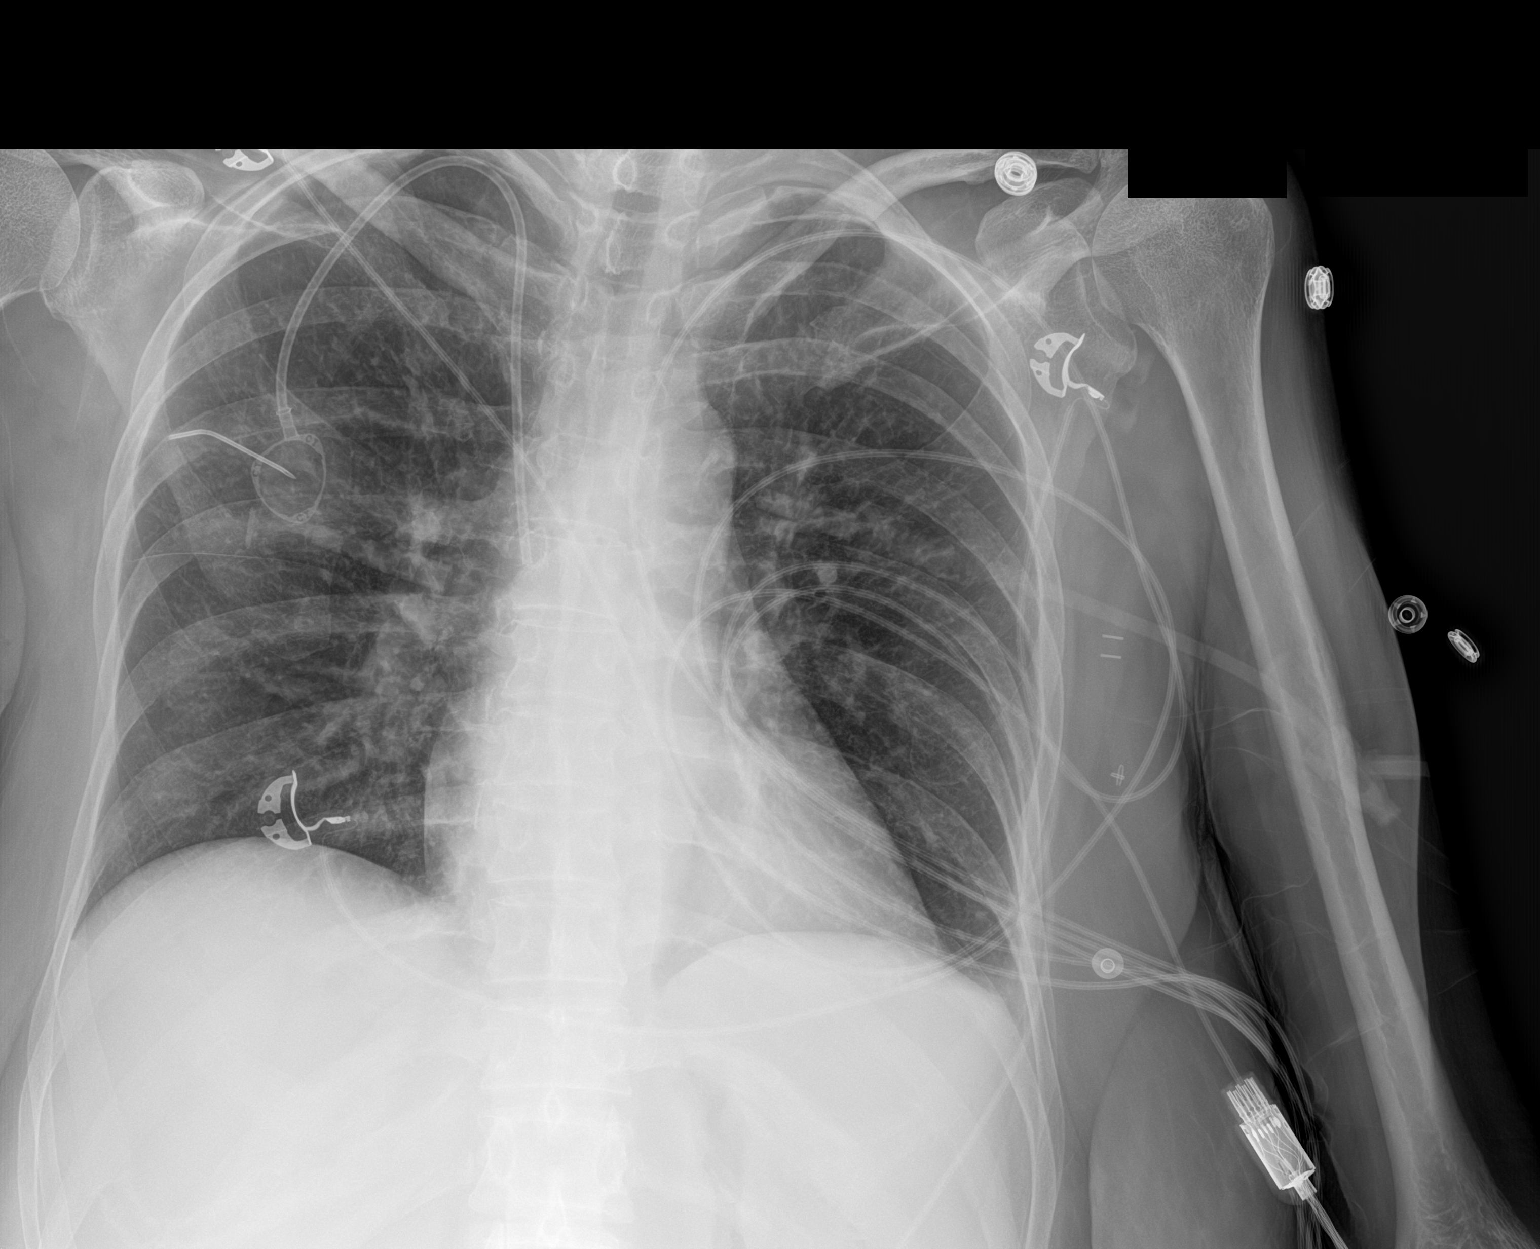

[1 of 1 positions shown; findings below may reference images not displayed]

FINDINGS: Stable right internal jugular Port-A-Cath. Surgical clips overlie
the left axilla. Stable cardiomediastinal silhouette with normal
heart size. No pneumothorax. No pleural effusion. Lungs appear
clear, with no acute consolidative airspace disease and no pulmonary
edema.
IMPRESSION: No active disease.

## 2019-05-24 IMAGING — CT CT ANGIOGRAPHY CHEST
2 of 8 series · 16 of 46 positions shown · IV contrast (omnipaque)
Comparison: 11/18/2018
COMPARISON: 11/18/2018

Addendum:
CLINICAL DATA: Shortness of breath. History of lymphoma. Low
probability for [id number]. Hypoxia on exertion.

EXAM:
CT ANGIOGRAPHY CHEST WITH CONTRAST
TECHNIQUE: Multidetector CT imaging of the chest was performed using the
standard protocol during bolus administration of intravenous
contrast. Multiplanar CT image reconstructions and MIPs were
obtained to evaluate the vascular anatomy.
CONTRAST:  80mL OMNIPAQUE IOHEXOL 350 MG/ML SOLN

[Series 6: thins · axial · 0.65mm/px · z∈[+1283,+1529]mm · 13 of 272 slices shown]
[im 13/272  lung]
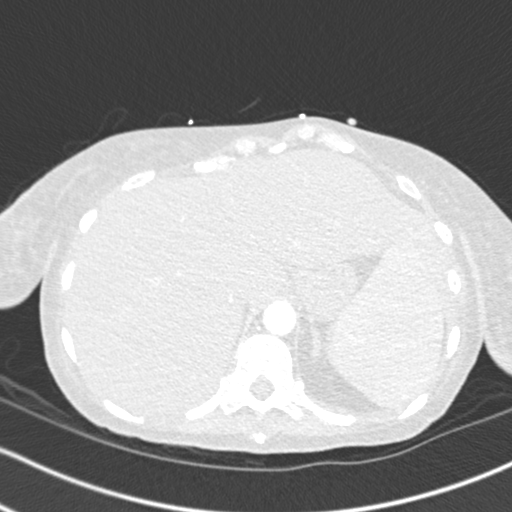
[im 37/272  soft-tissue]
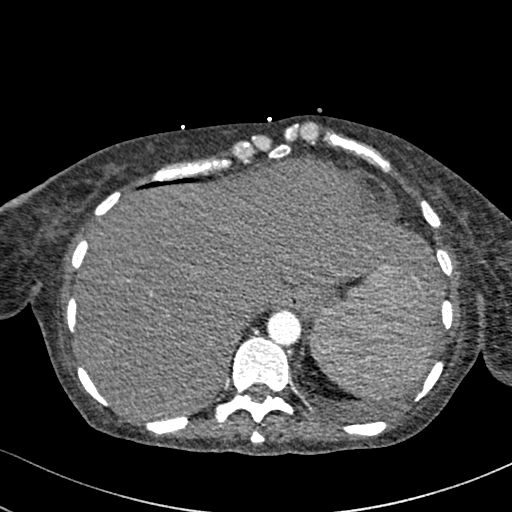
[im 62/272  lung]
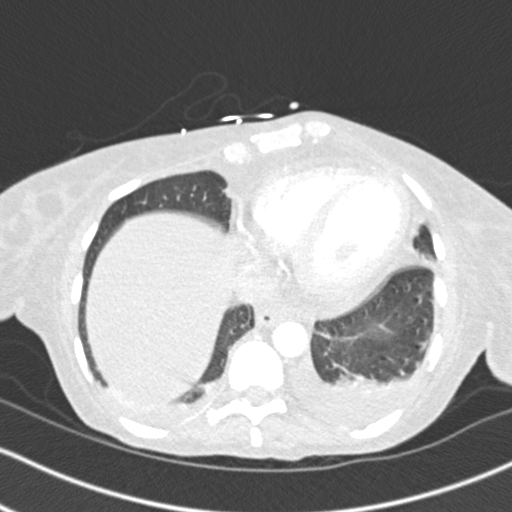
[im 74/272  soft-tissue]
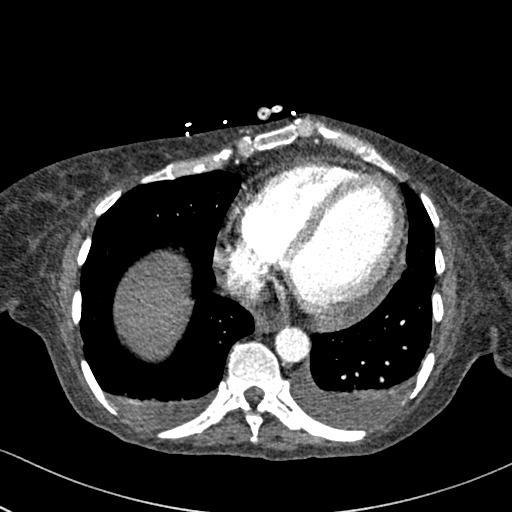
[im 99/272  lung]
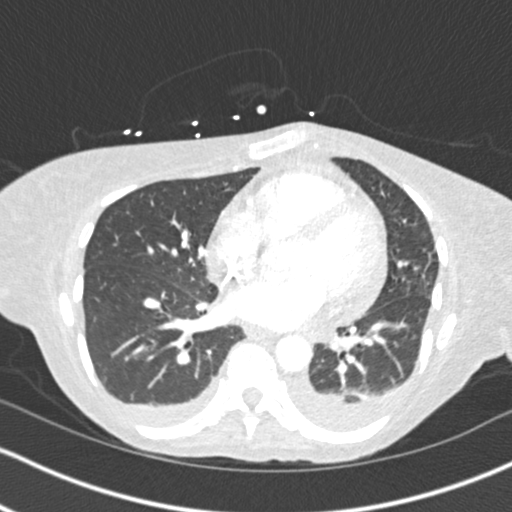
[im 111/272  soft-tissue]
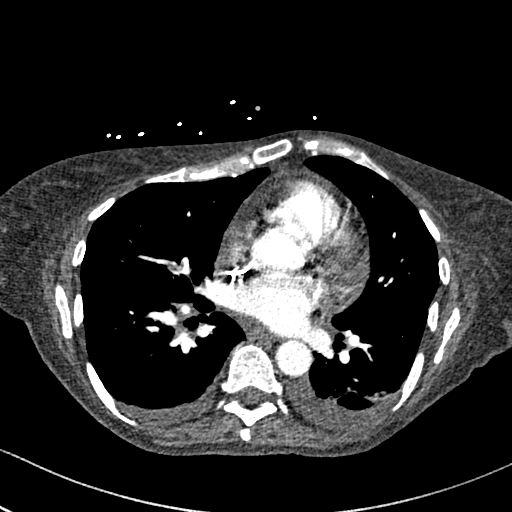
[im 136/272  lung]
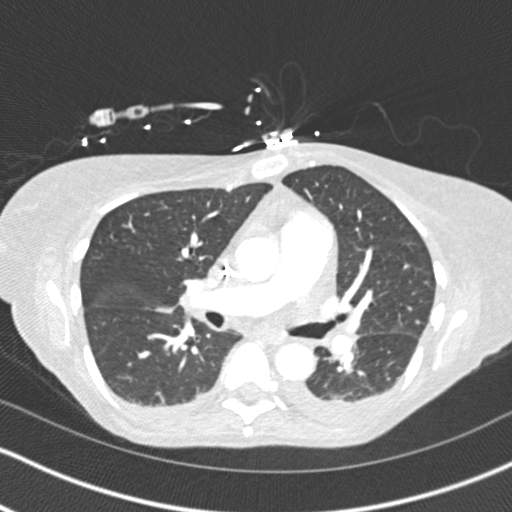
[im 161/272  soft-tissue]
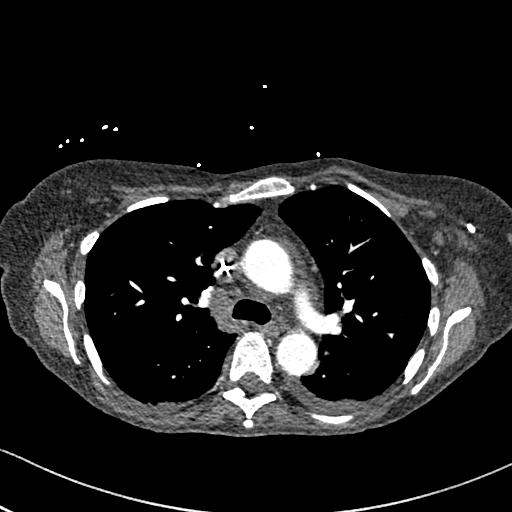
[im 173/272  lung]
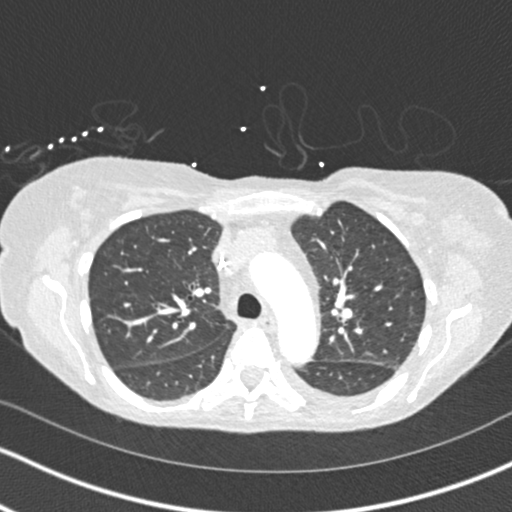
[im 198/272  soft-tissue]
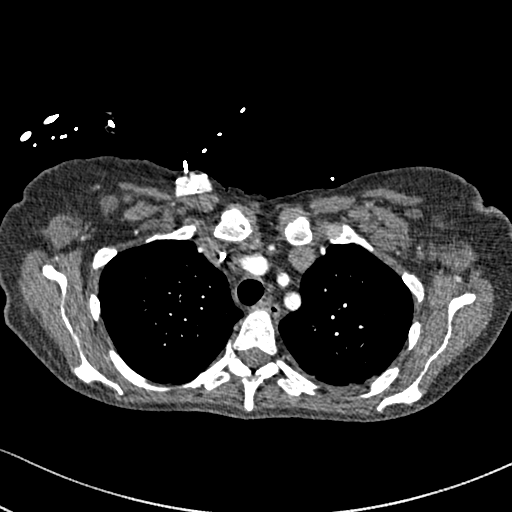
[im 210/272  lung]
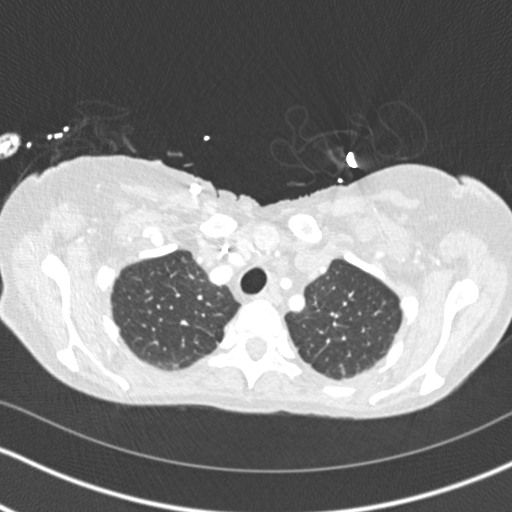
[im 235/272  soft-tissue]
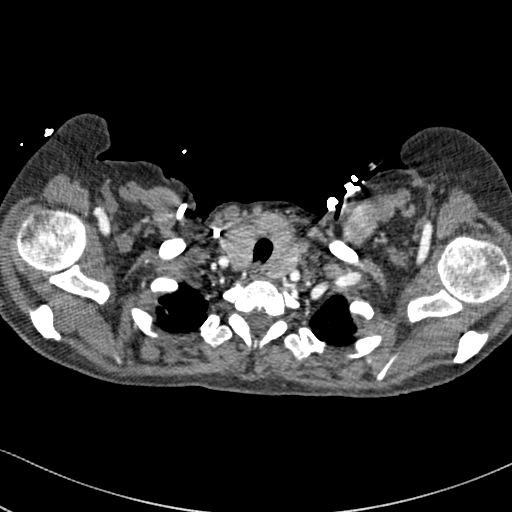
[im 259/272  lung]
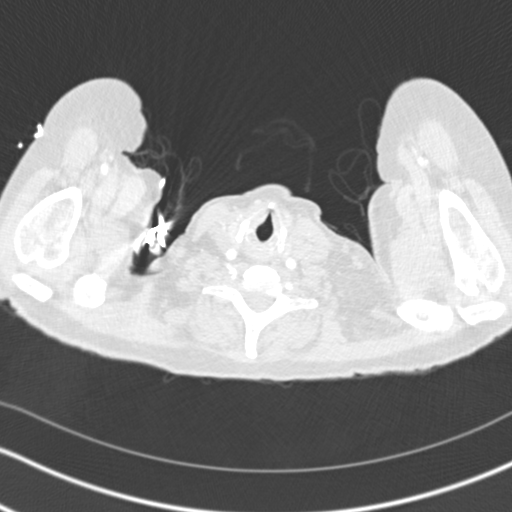

[Series 8: coronal mpr · coronal · 0.53mm/px · 3 of 110 slices shown]
[im 28/110  soft-tissue]
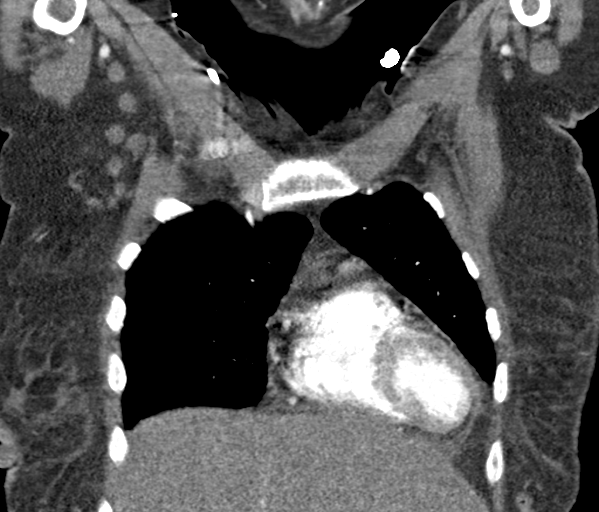
[im 55/110  soft-tissue]
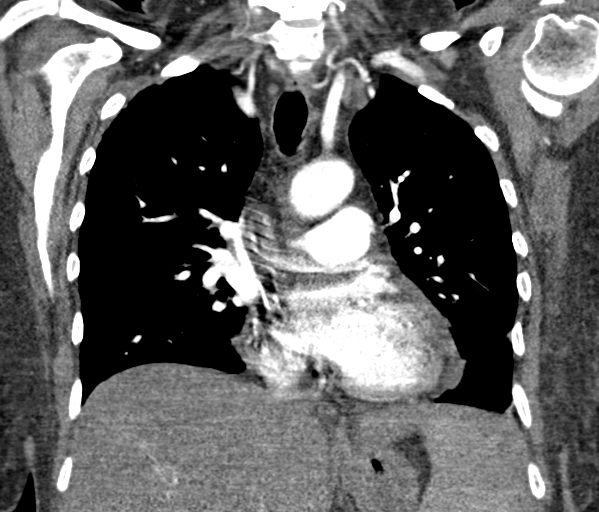
[im 82/110  soft-tissue]
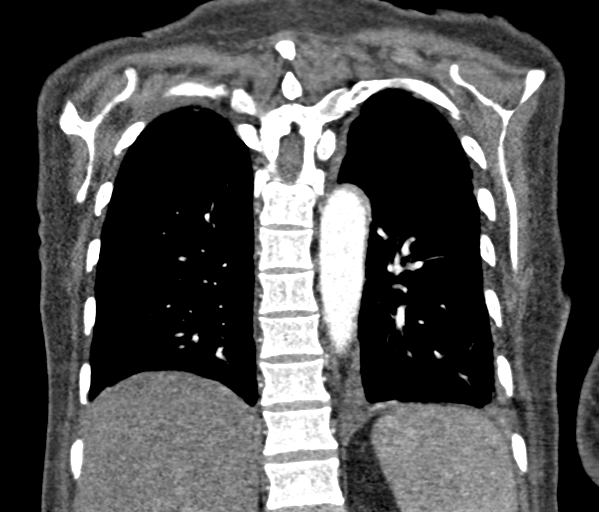

[16 of 46 positions shown; findings below may reference images not displayed]

FINDINGS: Cardiovascular: Right-sided Port-A-Cath with tip over the cavoatrial
junction. Borderline cardiomegaly. Small amount of pericardial fluid
is present with interval improvement. Thoracic aorta is normal in
caliber without evidence of aneurysm or dissection. Pulmonary
arterial system is adequately opacified. Small peripheral emboli are
seen over the more peripheral right middle lobar arteries. No other
right-sided emboli are noted. No left-sided pulmonary emboli
visualized. RV/LV ratio is 0.8. Remaining vascular structures are
unremarkable.

Mediastinum/Nodes: No mediastinal or hilar adenopathy. Mild
prominence of the thyroid gland with subcentimeter hypodense nodule
over the isthmus unchanged. Interval decrease superior mediastinal
and bilateral axillary adenopathy with surgical clips over the left
axillary nodes compatible with known lymphoma.

Lungs/Pleura: Lungs are adequately inflated and demonstrate small
bilateral pleural effusions slightly improved. There is minimal
associated bibasilar atelectasis improved. Airways are normal.

Upper Abdomen: Mild stable subjective splenomegaly. No acute
findings.

Musculoskeletal: Unremarkable.

Review of the MIP images confirms the above findings.
IMPRESSION: Small peripheral emboli over the right middle lobe pulmonary
arteries.

Small bilateral pleural effusions with associated bibasilar
atelectasis slightly improved.

Interval decrease in bilateral axillary adenopathy with surgical
clips over the left axillary nodes compatible with known lymphoma.

Borderline cardiomegaly with slight improvement of a small amount of
pericardial fluid.

Mild prominence of the thyroid with stable subcentimeter hypodense
nodule over the isthmus.

Suggestion mild splenomegaly unchanged.

ADDENDUM:
Critical Value/emergent results were called by telephone at the time
of interpretation on 01/06/2019 at [DATE] to Dr. ALEJANDRO JUAN DELFIN , who
verbally acknowledged these results.

*** End of Addendum ***
FINDINGS: Cardiovascular: Right-sided Port-A-Cath with tip over the cavoatrial
junction. Borderline cardiomegaly. Small amount of pericardial fluid
is present with interval improvement. Thoracic aorta is normal in
caliber without evidence of aneurysm or dissection. Pulmonary
arterial system is adequately opacified. Small peripheral emboli are
seen over the more peripheral right middle lobar arteries. No other
right-sided emboli are noted. No left-sided pulmonary emboli
visualized. RV/LV ratio is 0.8. Remaining vascular structures are
unremarkable.

Mediastinum/Nodes: No mediastinal or hilar adenopathy. Mild
prominence of the thyroid gland with subcentimeter hypodense nodule
over the isthmus unchanged. Interval decrease superior mediastinal
and bilateral axillary adenopathy with surgical clips over the left
axillary nodes compatible with known lymphoma.

Lungs/Pleura: Lungs are adequately inflated and demonstrate small
bilateral pleural effusions slightly improved. There is minimal
associated bibasilar atelectasis improved. Airways are normal.

Upper Abdomen: Mild stable subjective splenomegaly. No acute
findings.

Musculoskeletal: Unremarkable.

Review of the MIP images confirms the above findings.
IMPRESSION: Small peripheral emboli over the right middle lobe pulmonary
arteries.

Small bilateral pleural effusions with associated bibasilar
atelectasis slightly improved.

Interval decrease in bilateral axillary adenopathy with surgical
clips over the left axillary nodes compatible with known lymphoma.

Borderline cardiomegaly with slight improvement of a small amount of
pericardial fluid.

Mild prominence of the thyroid with stable subcentimeter hypodense
nodule over the isthmus.

Suggestion mild splenomegaly unchanged.

## 2020-09-11 IMAGING — US US ABDOMEN LIMITED
1 series · 14 of 25 positions shown · non-contrast
Comparison: CT abdomen and pelvis 11/27/2018

CLINICAL DATA: Elevated total bilirubin =

EXAM:
ULTRASOUND ABDOMEN LIMITED RIGHT UPPER QUADRANT

[Series 1: us abdomen limited · 0.15mm/px · 14 of 41 slices shown]
[im 1/41]
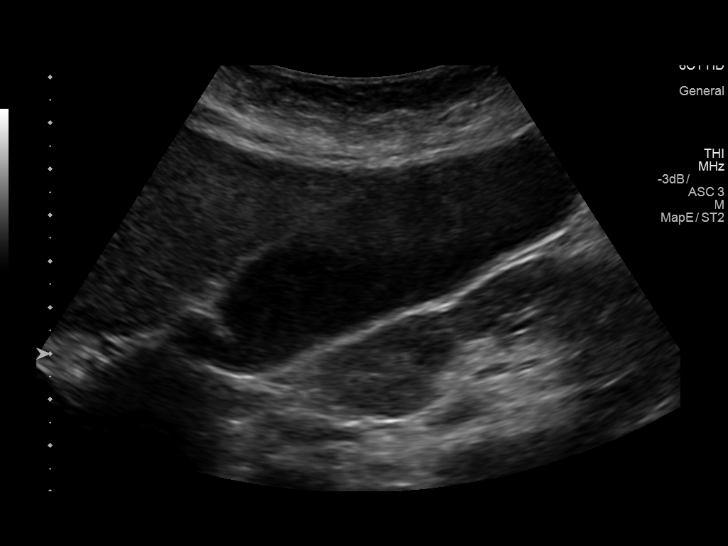
[im 4/41]
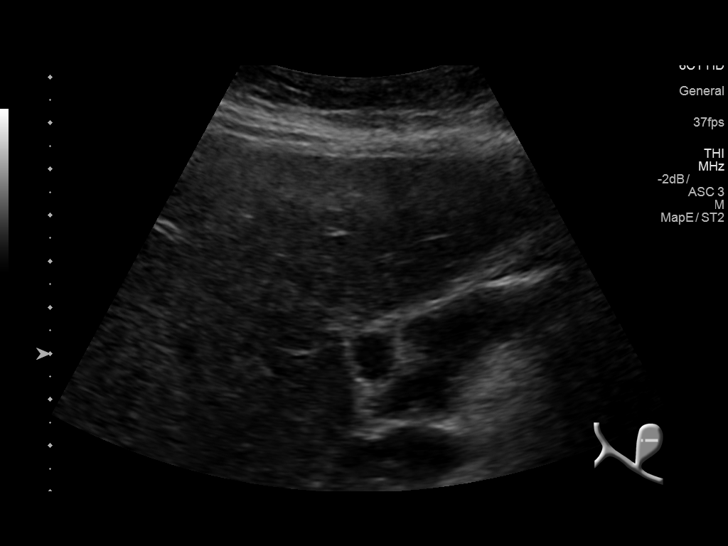
[im 7/41]
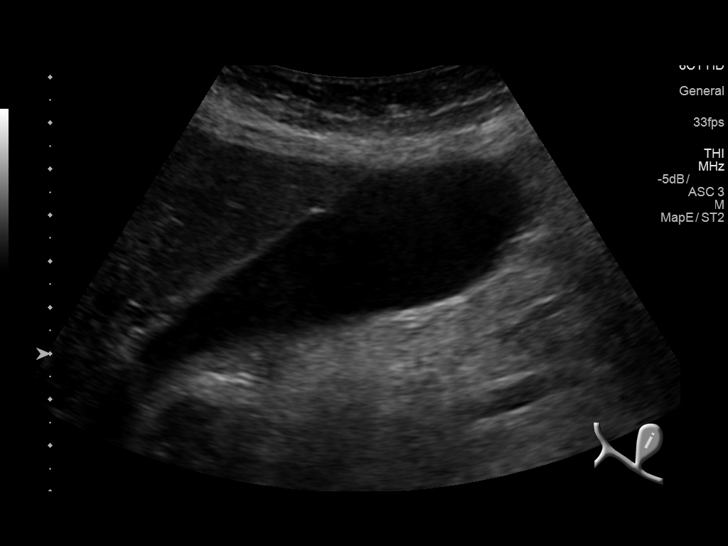
[im 11/41]
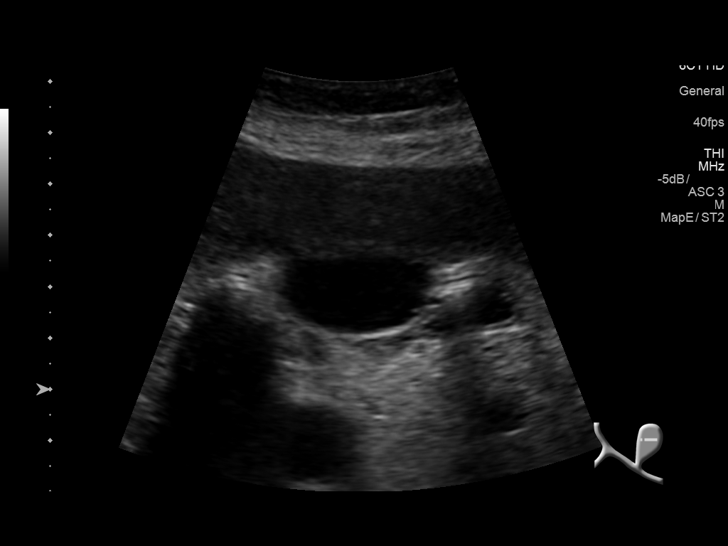
[im 14/41]
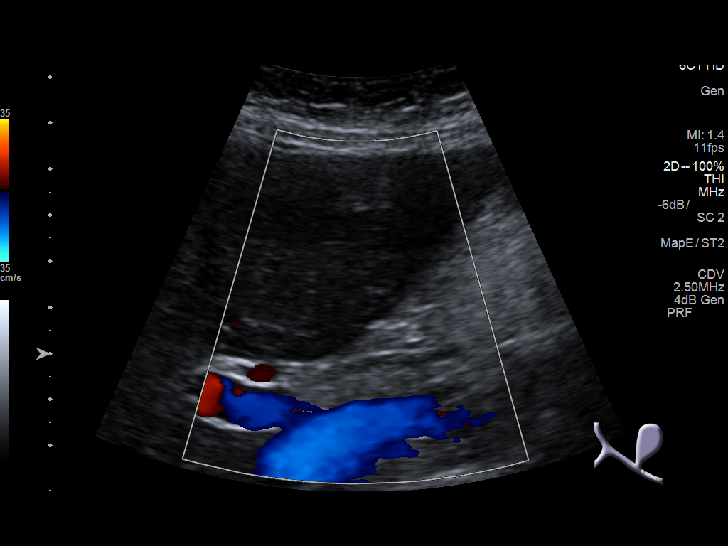
[im 16/41]
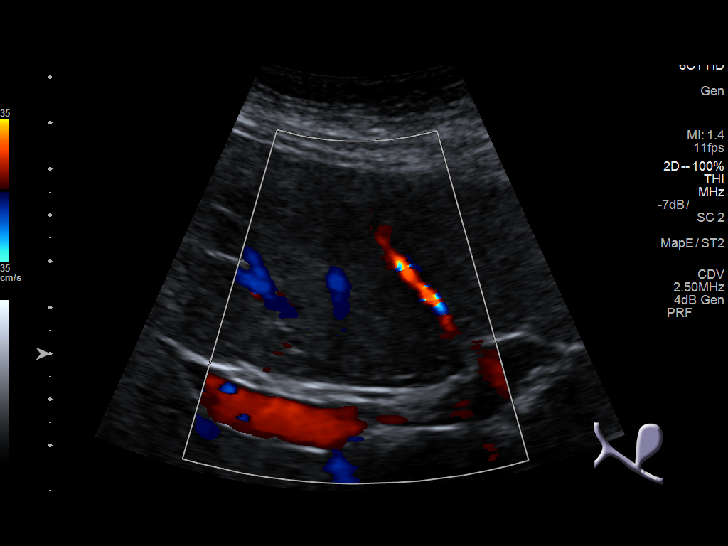
[im 19/41]
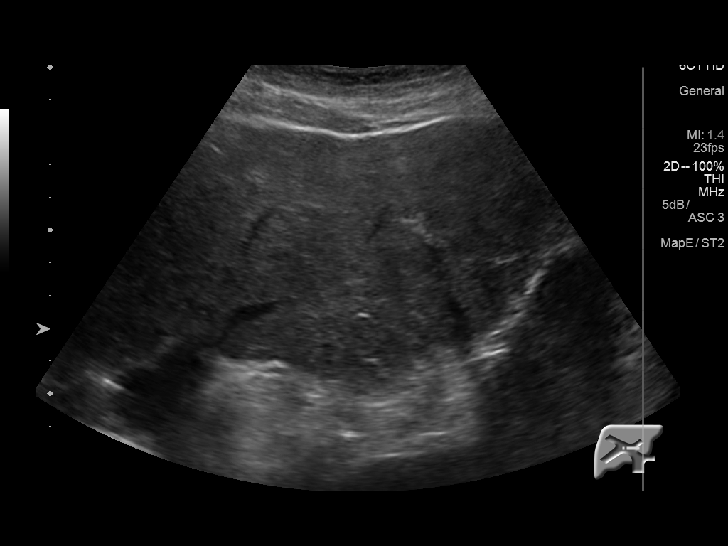
[im 22/41]
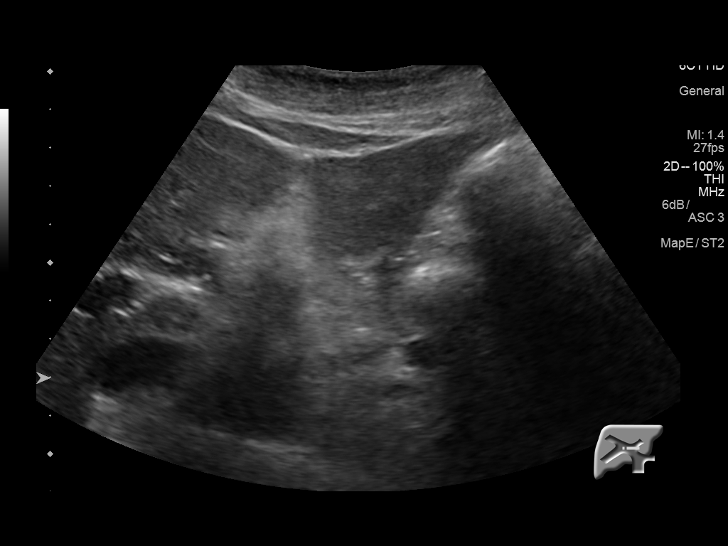
[im 26/41]
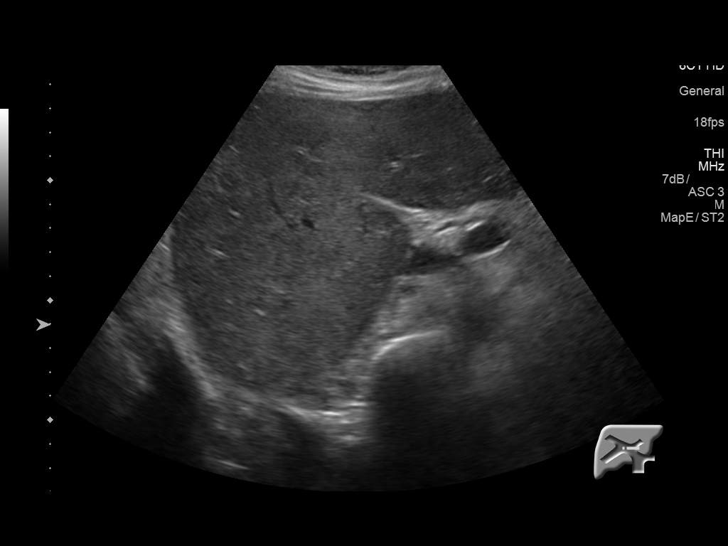
[im 27/41]
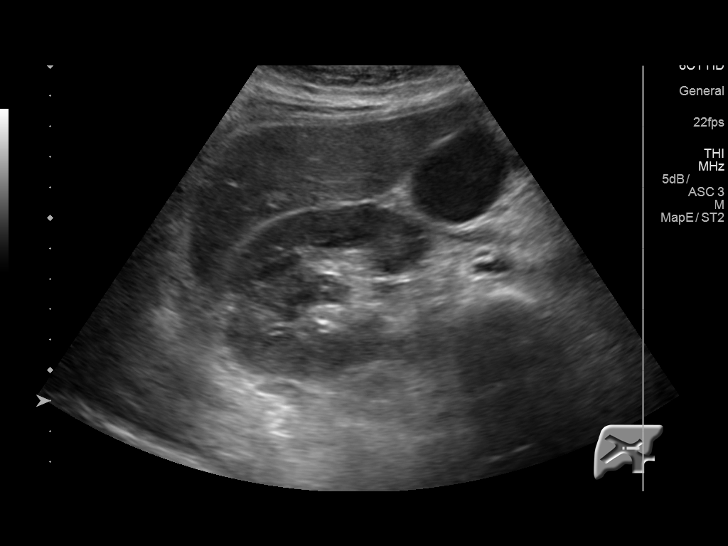
[im 31/41]
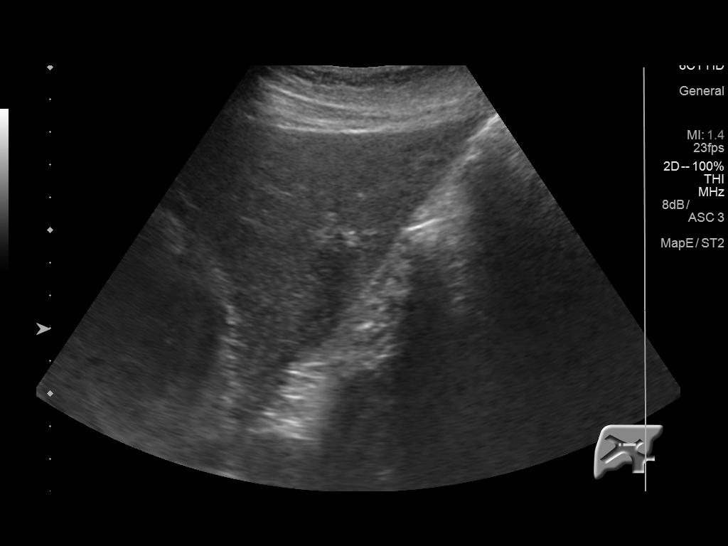
[im 34/41]
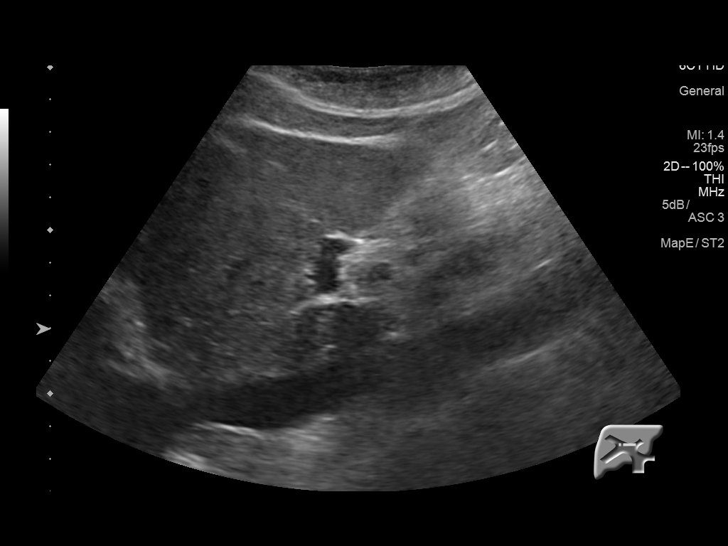
[im 37/41]
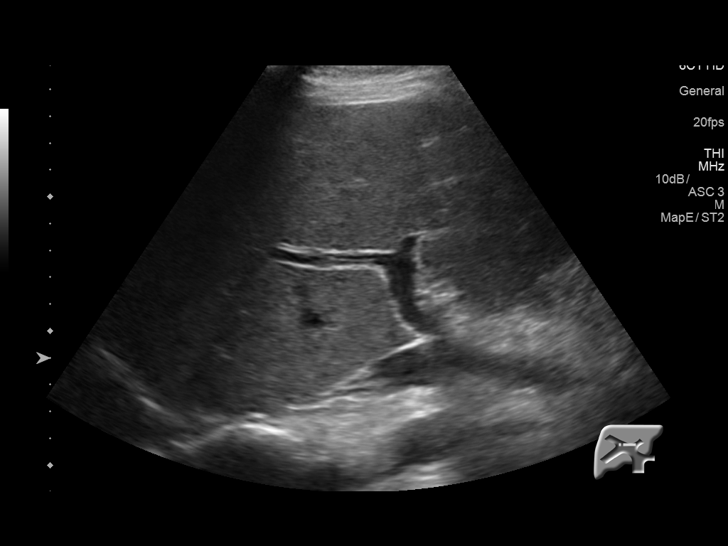
[im 41/41]
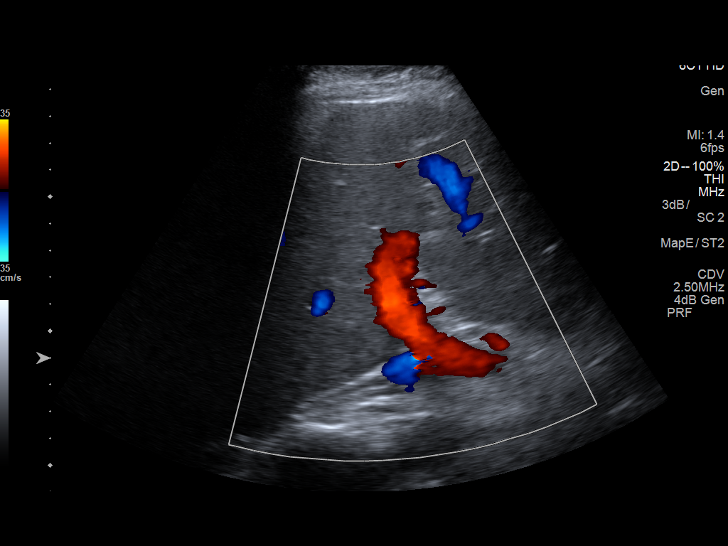

[14 of 25 positions shown; findings below may reference images not displayed]

FINDINGS: Gallbladder:

Normally distended without stones or wall thickening. No
pericholecystic fluid or sonographic Murphy sign.

Common bile duct:

Diameter: 3 mm diameter, normal

Liver:

Normal hepatic echogenicity. No mass or nodularity. No intrahepatic
biliary dilatation. Portal vein is patent on color Doppler imaging
with normal direction of blood flow towards the liver.

No RIGHT upper quadrant free fluid.
IMPRESSION: Normal exam.
